# Patient Record
Sex: Male | Born: 1975 | Hispanic: No | Marital: Married | Smoking: Current every day smoker
Health system: Southern US, Community
[De-identification: ages and names within clinical notes are randomized; demographics above are authoritative.]

## PROBLEM LIST (undated history)

## (undated) DIAGNOSIS — R7612 Nonspecific reaction to cell mediated immunity measurement of gamma interferon antigen response without active tuberculosis: Secondary | ICD-10-CM

## (undated) DIAGNOSIS — N5089 Other specified disorders of the male genital organs: Secondary | ICD-10-CM

## (undated) DIAGNOSIS — A048 Other specified bacterial intestinal infections: Secondary | ICD-10-CM

## (undated) DIAGNOSIS — N200 Calculus of kidney: Secondary | ICD-10-CM

## (undated) DIAGNOSIS — F4310 Post-traumatic stress disorder, unspecified: Secondary | ICD-10-CM

## (undated) DIAGNOSIS — E119 Type 2 diabetes mellitus without complications: Secondary | ICD-10-CM

## (undated) DIAGNOSIS — K219 Gastro-esophageal reflux disease without esophagitis: Secondary | ICD-10-CM

## (undated) DIAGNOSIS — I219 Acute myocardial infarction, unspecified: Secondary | ICD-10-CM

## (undated) DIAGNOSIS — E7800 Pure hypercholesterolemia, unspecified: Secondary | ICD-10-CM

## (undated) DIAGNOSIS — Z9889 Other specified postprocedural states: Secondary | ICD-10-CM

## (undated) DIAGNOSIS — R0789 Other chest pain: Secondary | ICD-10-CM

## (undated) HISTORY — DX: Other chest pain: R07.89

## (undated) HISTORY — DX: Other specified bacterial intestinal infections: A04.8

## (undated) HISTORY — DX: Post-traumatic stress disorder, unspecified: F43.10

## (undated) HISTORY — PX: HERNIA REPAIR: SHX51

## (undated) HISTORY — PX: CARDIAC CATHETERIZATION: SHX172

## (undated) HISTORY — DX: Gastro-esophageal reflux disease without esophagitis: K21.9

## (undated) HISTORY — DX: Other specified postprocedural states: Z98.890

---
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
## 2015-02-14 ENCOUNTER — Emergency Department (HOSPITAL_COMMUNITY): Payer: Medicaid Other

## 2015-02-14 ENCOUNTER — Observation Stay (HOSPITAL_COMMUNITY)
Admission: EM | Admit: 2015-02-14 | Discharge: 2015-02-15 | Disposition: A | Discharge status: Home / Self Care | Payer: Medicaid Other | Attending: Internal Medicine | Admitting: Internal Medicine

## 2015-02-14 ENCOUNTER — Encounter (HOSPITAL_COMMUNITY): Payer: Self-pay | Admitting: Emergency Medicine

## 2015-02-14 DIAGNOSIS — R079 Chest pain, unspecified: Secondary | ICD-10-CM | POA: Diagnosis present

## 2015-02-14 DIAGNOSIS — N200 Calculus of kidney: Secondary | ICD-10-CM | POA: Diagnosis not present

## 2015-02-14 DIAGNOSIS — I25119 Atherosclerotic heart disease of native coronary artery with unspecified angina pectoris: Secondary | ICD-10-CM | POA: Diagnosis not present

## 2015-02-14 DIAGNOSIS — I209 Angina pectoris, unspecified: Secondary | ICD-10-CM

## 2015-02-14 DIAGNOSIS — Z720 Tobacco use: Secondary | ICD-10-CM | POA: Insufficient documentation

## 2015-02-14 DIAGNOSIS — E876 Hypokalemia: Secondary | ICD-10-CM | POA: Diagnosis present

## 2015-02-14 DIAGNOSIS — Z9889 Other specified postprocedural states: Secondary | ICD-10-CM | POA: Insufficient documentation

## 2015-02-14 DIAGNOSIS — R9431 Abnormal electrocardiogram [ECG] [EKG]: Secondary | ICD-10-CM

## 2015-02-14 DIAGNOSIS — Z7982 Long term (current) use of aspirin: Secondary | ICD-10-CM | POA: Diagnosis not present

## 2015-02-14 HISTORY — DX: Calculus of kidney: N20.0

## 2015-02-14 LAB — BASIC METABOLIC PANEL
ANION GAP: 7 (ref 5–15)
BUN: 17 mg/dL (ref 6–20)
CHLORIDE: 108 mmol/L (ref 101–111)
CO2: 24 mmol/L (ref 22–32)
Calcium: 9.5 mg/dL (ref 8.9–10.3)
Creatinine, Ser: 0.7 mg/dL (ref 0.61–1.24)
GFR calc Af Amer: >60 mL/min (ref >60)
GLUCOSE: 111 mg/dL — AB (ref 65–99)
POTASSIUM: 3.4 mmol/L — AB (ref 3.5–5.1)
Sodium: 139 mmol/L (ref 135–145)

## 2015-02-14 LAB — CBC
HEMATOCRIT: 45.2 % (ref 39.0–52.0)
HEMOGLOBIN: 15.9 g/dL (ref 13.0–17.0)
MCH: 30.1 pg (ref 26.0–34.0)
MCHC: 35.2 g/dL (ref 30.0–36.0)
MCV: 85.6 fL (ref 78.0–100.0)
Platelets: 220 10*3/uL (ref 150–400)
RBC: 5.28 MIL/uL (ref 4.22–5.81)
RDW: 12.4 % (ref 11.5–15.5)
WBC: 7.2 10*3/uL (ref 4.0–10.5)

## 2015-02-14 LAB — I-STAT TROPONIN, ED: Troponin i, poc: 0 ng/mL (ref 0.00–0.08)

## 2015-02-14 IMAGING — DX DG CHEST 2 VIEW
2 series · 2 of 2 positions shown · non-contrast
Comparison: None.

CLINICAL DATA: Acute onset of severe chest pain radiating into the
left arm associated with diaphoresis earlier this evening.

EXAM:
CHEST  2 VIEW

[chest lat]
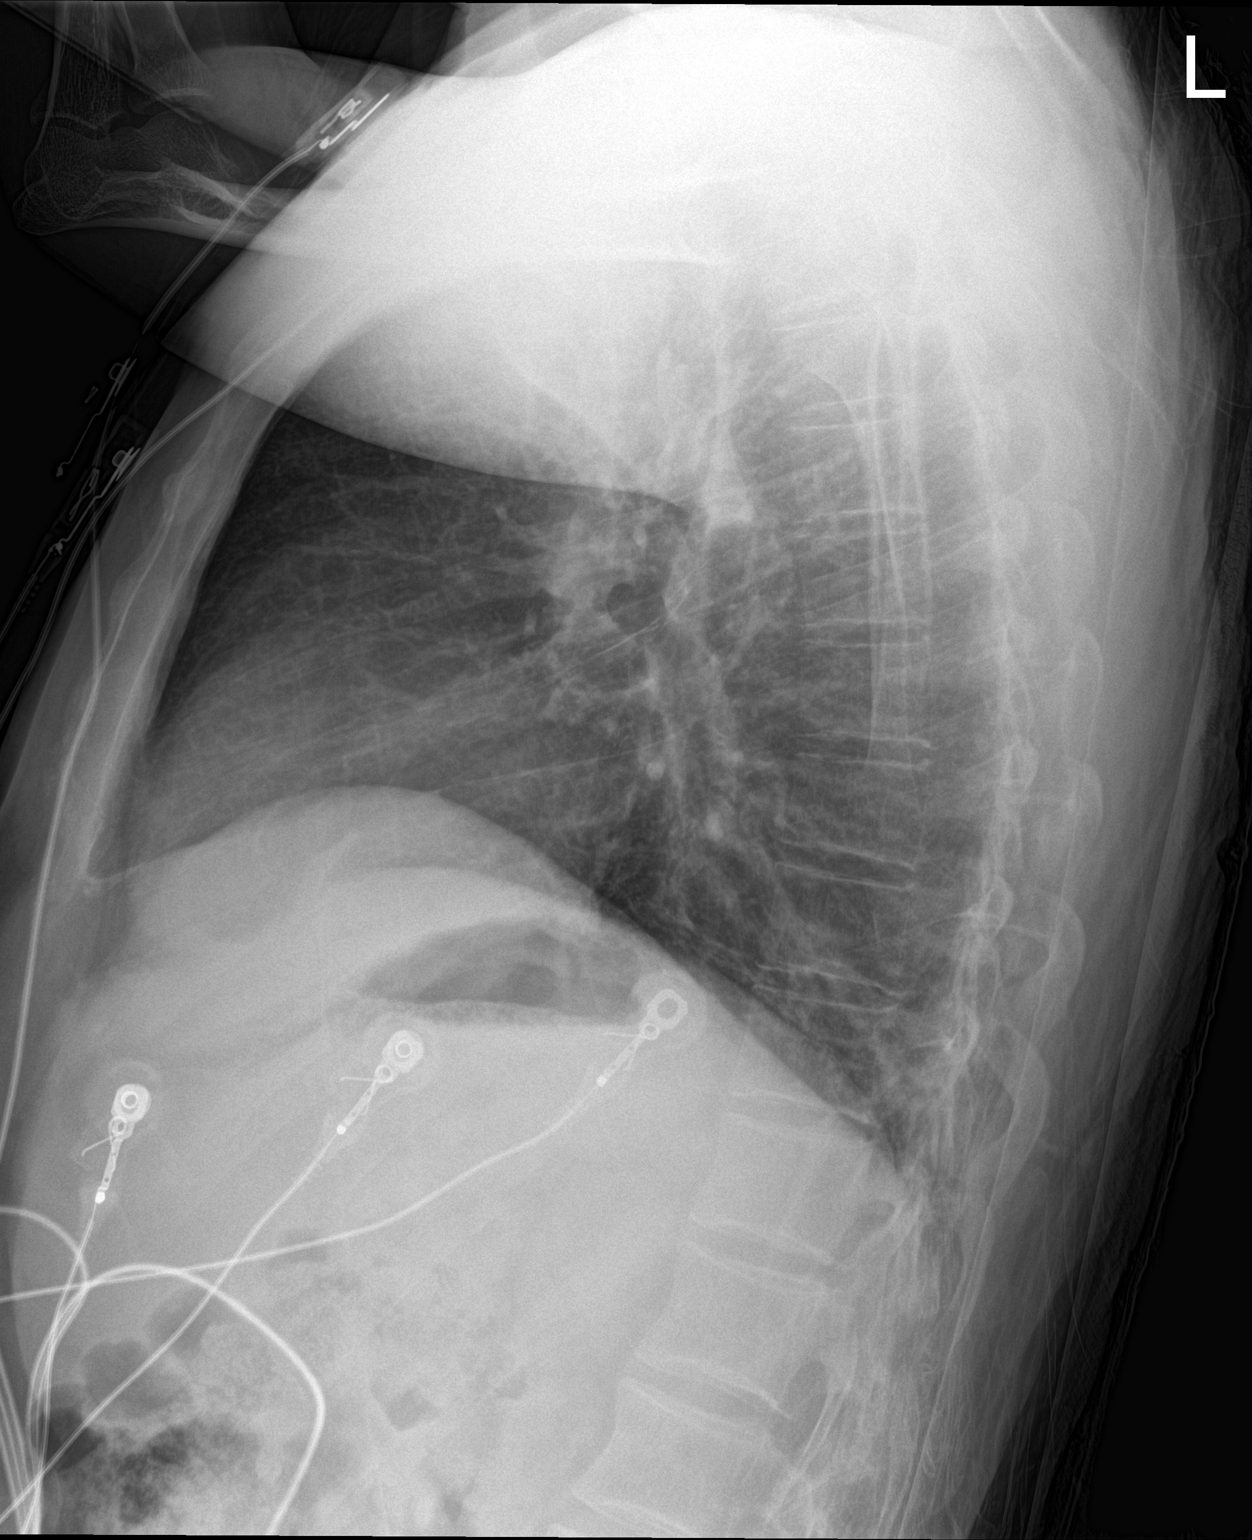

[chest ap]
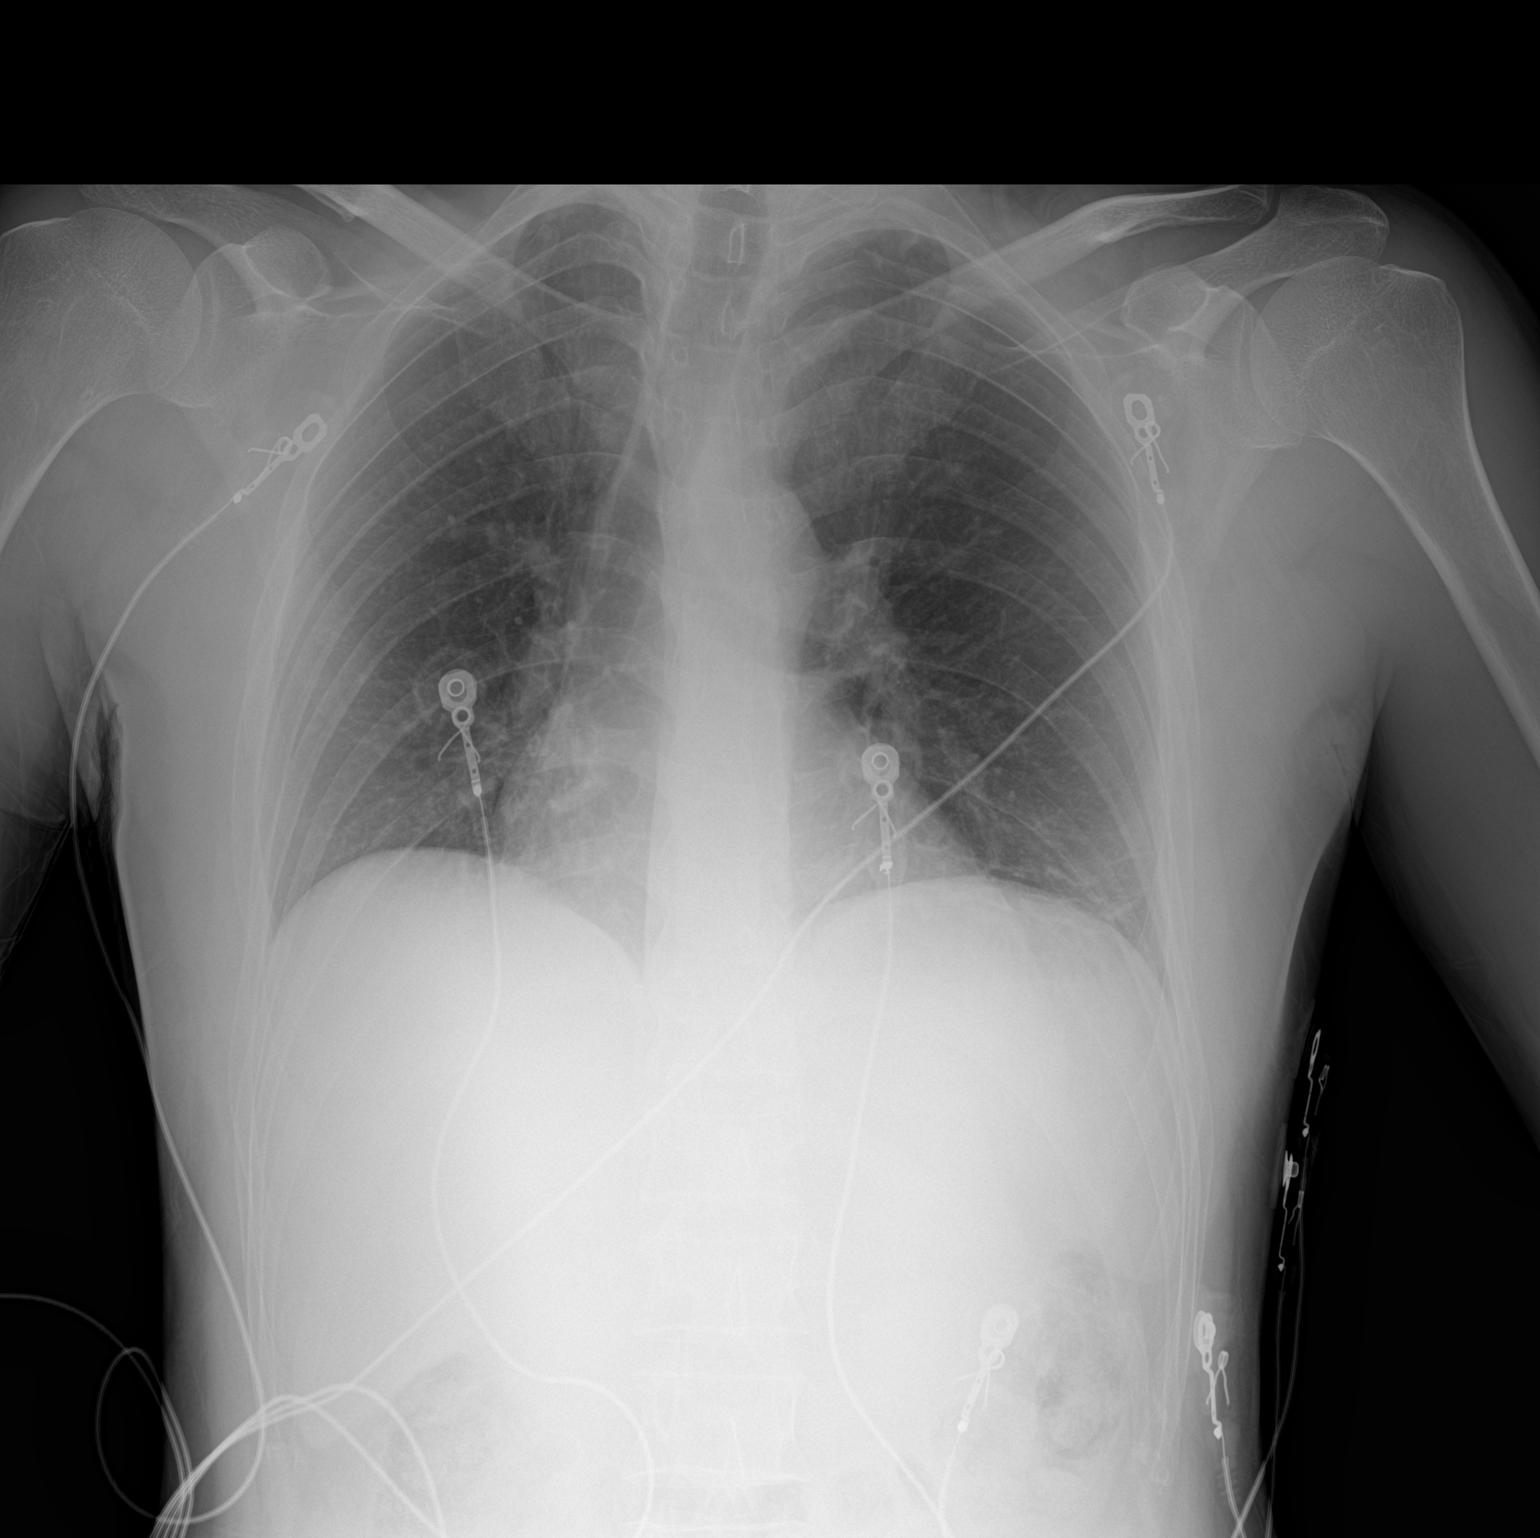

[2 of 2 positions shown; findings below may reference images not displayed]

FINDINGS: Suboptimal inspiration accounts for crowded bronchovascular
markings, especially in the bases, and accentuates the cardiac
silhouette. Taking this into account, cardiomediastinal silhouette
unremarkable. Linear atelectasis or scar in the left lower lobe.
Lungs otherwise clear. No localized airspace consolidation. No
pleural effusions. No pneumothorax. Normal pulmonary vascularity.
Visualized bony thorax intact.
IMPRESSION: Suboptimal inspiration. Linear atelectasis or scar in the left lower
lobe. No acute cardiopulmonary disease otherwise.

## 2015-02-14 NOTE — ED Notes (Signed)
Pt brought to ED by [facility] no able to communicate on English pt is from [place], [place] call for 10/10 CP going to his left arm, pt very diaphoretic on EMS arrival, 324 mg ASA PO given and 3 nitro SL pain down 4/10 on face scale, VS BP 151/84, HR 80, 100% RA. Pt resting on bed no distress noticed.

## 2015-02-14 NOTE — ED Notes (Signed)
Dr. Nanavati at the bedside.  

## 2015-02-14 NOTE — ED Notes (Signed)
Pt may language is Arabic, language line used, interpreter mumber [phone].

## 2015-02-14 NOTE — ED Provider Notes (Signed)
CSN: [id number]     Arrival date & time 02/14/15  2045 History   First MD Initiated Contact with Patient 02/14/15 2122     Chief Complaint  Patient presents with  . Chest Pain     (Consider location/radiation/quality/duration/timing/severity/associated sxs/prior Treatment) HPI Comments: Pt comes in with cc of chest pain. Pt speaks Arabic and translation phone used. Pt reports that he started having chest pain when he got upset earlier. The pain is L sided chest pain with radiation to the jaw and down his arm. Pain resolved with nitro that was given by EMS. There are no associated symptoms with the pain. Pt has had cath in the past at [facility] in [place]. Pt has no hx of PE, and besides recent long distance travels over the plane to [place]. Pt reports hx of stroke in the past as well. He is a smoker, father had a MI in his 1s.   ROS 10 Systems reviewed and are negative for acute change except as noted in the HPI.     The history is provided by the patient.    Past Medical History  Diagnosis Date  . Coronary artery disease     Cath in [place] and [place] but no PCI done  . Kidney stone    Past Surgical History  Procedure Laterality Date  . Hernia repair    . Cardiac catheterization      x 2   Family History  Problem Relation Age of Onset  . CAD Father   . Cancer Mother    Social History  Substance Use Topics  . Smoking status: Current Every Day Smoker -- 0.25 packs/day    Types: Cigarettes  . Smokeless tobacco: Never Used  . Alcohol Use: No    Review of Systems  Cardiovascular: Positive for chest pain.  All other systems reviewed and are negative.     Allergies  Review of patient's allergies indicates no known allergies.  Home Medications   Prior to Admission medications   Medication Sig Start Date End Date Taking? Authorizing Provider  aspirin 325 MG tablet Take 325 mg by mouth daily.   Yes Historical Provider, MD   BP 120/63 mmHg  Pulse 79   Temp(Src) 98.4 F (36.9 C) (Oral)  Resp 18  Ht 5' 2.99" (1.6 m)  Wt 139 lb 1.6 oz (63.095 kg)  BMI 24.65 kg/m2  SpO2 97% Physical Exam  Constitutional: He is oriented to person, place, and time. He appears well-developed.  HENT:  Head: Normocephalic and atraumatic.  Eyes: Conjunctivae and EOM are normal. Pupils are equal, round, and reactive to light.  Neck: Normal range of motion. Neck supple.  Cardiovascular: Normal rate and regular rhythm.   Pulmonary/Chest: Effort normal and breath sounds normal.  Abdominal: Soft. Bowel sounds are normal. He exhibits no distension. There is no tenderness. There is no rebound and no guarding.  Neurological: He is alert and oriented to person, place, and time.  Skin: Skin is warm.  Nursing note and vitals reviewed.     ED Course  Procedures (including critical care time) Labs Review Labs Reviewed  BASIC METABOLIC PANEL - Abnormal; Notable for the following:    Potassium 3.4 (*)    Glucose, Bld 111 (*)    All other components within normal limits  HEMOGLOBIN A1C - Abnormal; Notable for the following:    Hgb A1c MFr Bld 5.8 (*)    All other components within normal limits  LIPID PANEL - Abnormal;  Notable for the following:    HDL 36 (*)    LDL Cholesterol 129 (*)    All other components within normal limits  CBC  TROPONIN I  TROPONIN I  TROPONIN I  TROPONIN I  I-STAT TROPOININ, ED  I-STAT TROPOININ, ED    Imaging Review Dg Chest 2 View  02/14/2015   CLINICAL DATA:  Acute onset of severe chest pain radiating into the left arm associated with diaphoresis earlier this evening.  EXAM: CHEST  2 VIEW  COMPARISON:  None.  FINDINGS: Suboptimal inspiration accounts for crowded bronchovascular markings, especially in the bases, and accentuates the cardiac silhouette. Taking this into account, cardiomediastinal silhouette unremarkable. Linear atelectasis or scar in the left lower lobe. Lungs otherwise clear. No localized airspace consolidation.  No pleural effusions. No pneumothorax. Normal pulmonary vascularity. Visualized bony thorax intact.  IMPRESSION: Suboptimal inspiration. Linear atelectasis or scar in the left lower lobe. No acute cardiopulmonary disease otherwise.   Electronically Signed   By: Hulan Saas M.D.   On: 02/14/2015 21:51   Nm Myocar Multi W/spect W/wall Motion / Ef  02/15/2015    There was no ST segment deviation noted during stress.  The study is normal.  This is a low risk study.  The left ventricular ejection fraction is mildly decreased (45-54%).   This a normal study. LVEF appears better than calculated.    I have personally reviewed and evaluated these images and lab results as part of my medical decision-making.   EKG Interpretation   Date/Time:  Monday February 14 2015 20:53:17 EDT Ventricular Rate:  87 PR Interval:  141 QRS Duration: 80 QT Interval:  348 QTC Calculation: 419 R Axis:   56 Text Interpretation:  Sinus rhythm Borderline repolarization abnormality  st depression lead 3, avf t wave inversion v3-v6 diffuse q waves No old  tracing to compare Confirmed by Christopher Glasscock, MD, Janey Genta [phone]) on 02/14/2015  9:05:14 PM        MDM   Final diagnoses:  Angina pectoris  Abnormal EKG    Pt comes in with cc of chest pain. HEAR score is 3 - 2 for hx and 1 for EKG. That being said, pt doesn't speak English, just arrived to [place] a week ago and has no pcp, no Cardiology and no understanding of [place] healthcare system - so we will admit the patient for further evaluation and serial trops.    Derwood Kaplan, MD 02/16/15 [phone]

## 2015-02-15 ENCOUNTER — Observation Stay (HOSPITAL_COMMUNITY): Payer: Medicaid Other

## 2015-02-15 ENCOUNTER — Encounter (HOSPITAL_COMMUNITY): Payer: Self-pay | Admitting: Internal Medicine

## 2015-02-15 DIAGNOSIS — R079 Chest pain, unspecified: Secondary | ICD-10-CM

## 2015-02-15 DIAGNOSIS — Z720 Tobacco use: Secondary | ICD-10-CM | POA: Diagnosis not present

## 2015-02-15 DIAGNOSIS — I2510 Atherosclerotic heart disease of native coronary artery without angina pectoris: Secondary | ICD-10-CM

## 2015-02-15 DIAGNOSIS — E876 Hypokalemia: Secondary | ICD-10-CM | POA: Diagnosis not present

## 2015-02-15 DIAGNOSIS — N200 Calculus of kidney: Secondary | ICD-10-CM | POA: Diagnosis not present

## 2015-02-15 DIAGNOSIS — I25119 Atherosclerotic heart disease of native coronary artery with unspecified angina pectoris: Secondary | ICD-10-CM | POA: Diagnosis not present

## 2015-02-15 DIAGNOSIS — Z7982 Long term (current) use of aspirin: Secondary | ICD-10-CM | POA: Diagnosis not present

## 2015-02-15 LAB — NM MYOCAR MULTI W/SPECT W/WALL MOTION / EF
CHL CUP NUCLEAR SRS: 5
CHL CUP NUCLEAR SSS: 6
LHR: 0.24
LV dias vol: 81 mL
LVSYSVOL: 44 mL
NUC STRESS TID: 1.09
Rest HR: 64 {beats}/min
SDS: 1

## 2015-02-15 LAB — LIPID PANEL
CHOLESTEROL: 188 mg/dL (ref 0–200)
HDL: 36 mg/dL — AB (ref >40)
LDL CALC: 129 mg/dL — AB (ref 0–99)
TRIGLYCERIDES: 116 mg/dL (ref <150)
Total CHOL/HDL Ratio: 5.2 RATIO
VLDL: 23 mg/dL (ref 0–40)

## 2015-02-15 LAB — TROPONIN I
Troponin I: <0.03 ng/mL (ref <0.031)
Troponin I: <0.03 ng/mL (ref <0.031)

## 2015-02-15 LAB — I-STAT TROPONIN, ED: TROPONIN I, POC: 0.01 ng/mL (ref 0.00–0.08)

## 2015-02-15 MED ORDER — ENOXAPARIN SODIUM 40 MG/0.4ML ~~LOC~~ SOLN
40.0000 mg | SUBCUTANEOUS | Status: DC
  Administered 2015-02-15: 40 mg via SUBCUTANEOUS
  Filled 2015-02-15: qty 0.4

## 2015-02-15 MED ORDER — REGADENOSON 0.4 MG/5ML IV SOLN
0.4000 mg | Freq: Once | INTRAVENOUS | Status: DC
  Filled 2015-02-15: qty 5

## 2015-02-15 MED ORDER — ACETAMINOPHEN 325 MG PO TABS: 650.0000 mg | ORAL_TABLET | ORAL | Status: DC | PRN

## 2015-02-15 MED ORDER — MORPHINE SULFATE (PF) 2 MG/ML IV SOLN
2.0000 mg | INTRAVENOUS | Status: DC | PRN
  Administered 2015-02-15: 2 mg via INTRAVENOUS
  Filled 2015-02-15: qty 1

## 2015-02-15 MED ORDER — ONDANSETRON HCL 4 MG/2ML IJ SOLN
4.0000 mg | Freq: Four times a day (QID) | INTRAMUSCULAR | Status: DC | PRN
  Administered 2015-02-15: 4 mg via INTRAVENOUS
  Filled 2015-02-15: qty 2

## 2015-02-15 MED ORDER — TECHNETIUM TC 99M SESTAMIBI GENERIC - CARDIOLITE
33.0000 | Freq: Once | INTRAVENOUS | Status: AC | PRN
  Administered 2015-02-15: 33 via INTRAVENOUS

## 2015-02-15 MED ORDER — REGADENOSON 0.4 MG/5ML IV SOLN
INTRAVENOUS | Status: AC
  Filled 2015-02-15: qty 5

## 2015-02-15 MED ORDER — ASPIRIN EC 325 MG PO TBEC
325.0000 mg | DELAYED_RELEASE_TABLET | Freq: Every day | ORAL | Status: DC
  Administered 2015-02-15: 325 mg via ORAL
  Filled 2015-02-15: qty 1

## 2015-02-15 MED ORDER — TECHNETIUM TC 99M SESTAMIBI GENERIC - CARDIOLITE
10.9000 | Freq: Once | INTRAVENOUS | Status: AC | PRN
  Administered 2015-02-15: 10.9 via INTRAVENOUS

## 2015-02-15 NOTE — Progress Notes (Signed)
Lexiscan Myoview performed, images and final report pending.  Corine Shelter PA-C 02/15/2015 12:34 PM

## 2015-02-15 NOTE — Progress Notes (Signed)
Patient discharged.  Discharge instructions and followup information reviewed with patient and interpreter.  Virgel Bouquet took patient home.

## 2015-02-15 NOTE — Progress Notes (Signed)
Myoview read by Dr Delton See- normal. We can arrange f/u as an OP if needed.  Corine Shelter PA-C 02/15/2015 3:31 PM

## 2015-02-15 NOTE — Progress Notes (Signed)
Dr. Gwendolyn Grant accepted the patient, he will now be the patients PCP.  He also will be taking over the care of the other family members.  This information is per Maren Reamer, RN with [facility] congregational nurses.

## 2015-02-15 NOTE — Discharge Summary (Signed)
Physician Discharge Summary  Aaron Reyes ZOX:[id number] DOB: [birth year] DOA: 02/14/2015  PCP: Dr Renold Don Admit date: 02/14/2015 Discharge date: 02/15/2015  Time spent: 20 minutes  Recommendations for Outpatient Follow-up:  1. D/c home with outopt follow up with PCP and cardiology  Discharge Diagnoses:  Active Problems:   Chest pain at rest   Hypokalemia   Coronary artery disease   Discharge Condition: fair  Diet recommendation: heart heallty  Filed Weights   02/14/15 2100 02/14/15 2117 02/15/15 0201  Weight: 86.183 kg (190 lb) 65 kg (143 lb 4.8 oz) 63.095 kg (139 lb 1.6 oz)    History of present illness:  Aaron Reyes is a 39 y.o. male   has a past medical history of Coronary artery disease and Kidney stone.   He was brought to ED with chest pain onset of the patient being emotionally upset. Pain was radiating to left arm area. EMS notes patient was diaphoretic on their arrival. He was given aspirin as well as nitroglycerin. After which hs pain improved.  Patient and his wife and 5 small children recently arrived from [place]. Reports having hx of CAD needing cardiac catheterization x2 ( 6years and 1 year back, reports he was told having some abnormal artery.) States he has lost all his medical records. He takes aspirin at home.  On arrival to emergency department troponin was given normal limits but EKG showed nonspecific changes no old EKG available. Patient  admitted for further workup.   Hospital Course:  Chest pain  appears atypical and musculoskeletal vs related to anxiety  serial troponin negative Stress myoview negative for ischemia shows EFof 45-54% . 2D echo with normal EF and no wall motion abnormality. Patient stable for discharge home. Continue ASA. Has been established care with new PCP. Cardiology to follow as outpt as needed.  Hypokalemia replenished  Procedures:  2 d ECHO  myoview  Consultations:  cardiology  Discharge Exam: Filed  Vitals:   02/15/15 1233  BP:   Pulse: 79  Temp:   Resp:     General: NAD HEENT: no pallor, moist mucosa Chest : clear b/l  Cardiovascular: NS1& S2, no murmurs GI: soft, NT, ND, BS+ Musculoskeletal: warm, no edema  Discharge Instructions    Current Discharge Medication List    CONTINUE these medications which have NOT CHANGED   Details  aspirin 325 MG tablet Take 325 mg by mouth daily.      STOP taking these medications     PRESCRIPTION MEDICATION        No Known Allergies Follow-up Information    Follow up with [facility], MD On 03/16/2015.   Specialty:  Family Medicine   Why:  3 pm   Contact information:   [address] [place] [address] [phone]        The results of significant diagnostics from this hospitalization (including imaging, microbiology, ancillary and laboratory) are listed below for reference.    Significant Diagnostic Studies: Dg Chest 2 View  02/14/2015   CLINICAL DATA:  Acute onset of severe chest pain radiating into the left arm associated with diaphoresis earlier this evening.  EXAM: CHEST  2 VIEW  COMPARISON:  None.  FINDINGS: Suboptimal inspiration accounts for crowded bronchovascular markings, especially in the bases, and accentuates the cardiac silhouette. Taking this into account, cardiomediastinal silhouette unremarkable. Linear atelectasis or scar in the left lower lobe. Lungs otherwise clear. No localized airspace consolidation. No pleural effusions. No pneumothorax. Normal pulmonary vascularity. Visualized bony thorax intact.  IMPRESSION: Suboptimal inspiration. Linear atelectasis or scar in the left lower lobe. No acute cardiopulmonary disease otherwise.   Electronically Signed   By: Hulan Saas M.D.   On: 02/14/2015 21:51   Nm Myocar Multi W/spect W/wall Motion / Ef  02/15/2015    There was no ST segment deviation noted during stress.  The study is normal.  This is a low risk study.  The left ventricular  ejection fraction is mildly decreased (45-54%).   This a normal study. LVEF appears better than calculated.     Microbiology: No results found for this or any previous visit (from the past 240 hour(s)).   Labs: Basic Metabolic Panel:  Recent Labs Lab 02/14/15 2125  NA 139  K 3.4*  CL 108  CO2 24  GLUCOSE 111*  BUN 17  CREATININE 0.70  CALCIUM 9.5   Liver Function Tests: No results for input(s): AST, ALT, ALKPHOS, BILITOT, PROT, ALBUMIN in the last 168 hours. No results for input(s): LIPASE, AMYLASE in the last 168 hours. No results for input(s): AMMONIA in the last 168 hours. CBC:  Recent Labs Lab 02/14/15 2125  WBC 7.2  HGB 15.9  HCT 45.2  MCV 85.6  PLT 220   Cardiac Enzymes:  Recent Labs Lab 02/14/15 2359 02/15/15 0510 02/15/15 0734 02/15/15 1045  TROPONINI <0.03 <0.03 <0.03 <0.03   BNP: BNP (last 3 results) No results for input(s): BNP in the last 8760 hours.  ProBNP (last 3 results) No results for input(s): PROBNP in the last 8760 hours.  CBG: No results for input(s): GLUCAP in the last 168 hours.     SignedEddie North  [facility] 02/15/2015, 4:24 PM

## 2015-02-15 NOTE — Consult Note (Signed)
Admit date: 02/14/2015 Referring Physician  Dr. Gonzella Lex Primary Physician  None Primary Cardiologist  None Reason for Consultation  Chest pain  HPI: Aaron Reyes is a 39 y.o. Male with a past medical history of Coronary artery disease and Kidney stone. He presented with chest pain which occurred after being emotionally upset. Pain was radiating to left arm area. EMS was called and noted that the patient was diaphoretic. He was given aspirin as well as nitroglycerin after which pain improved.  Patient and his wife and 5 small children recently arrived from [place]. He reports having a hx of CAD needing cardiac catheterization 4.5 years ago in [place] but nothing was done and then repeat cath in [place] and again nothing was done. He sates he has lost all his medical records. He takes aspirin on occasion.  On arrival to emergency department troponin was within normal limits but EKG showed nonspecific changes no old EKG available. He has ruled out for MI by enzymes.     The patient does not speak English so history obtained through an interpreter.  He says that he got very upset yesterday.  He moved his family here as refugees and the living conditions they are in are terrible.  There are roaches all over the home.  He called the agency that brought them here and was told that nothing was going to be done.  He got very upset and then started having sharp chest pain with SOB, dizziness and nausea and then had a syncopal episode.  He denies any further CP at this time.    PMH:   Past Medical History  Diagnosis Date  . Coronary artery disease   . Kidney stone      PSH:   Past Surgical History  Procedure Laterality Date  . Cardiac catheterization      x 2  . Hernia repair      Allergies:  Review of patient's allergies indicates no known allergies. Prior to Admit Meds:   Prescriptions prior to admission  Medication Sig Dispense Refill Last Dose  . aspirin 325 MG tablet Take 325 mg by  mouth daily.   02/15/2015 at Unknown time  . PRESCRIPTION MEDICATION Stomach medication   02/14/2015 at Unknown time   Fam HX:    Family History  Problem Relation Age of Onset  . CAD Father    Social HX:    Social History   Social History  . Marital Status: Married    Spouse Name: N/A  . Number of Children: N/A  . Years of Education: N/A   Occupational History  . Not on file.   Social History Main Topics  . Smoking status: Current Every Day Smoker -- 0.25 packs/day    Types: Cigarettes  . Smokeless tobacco: Never Used  . Alcohol Use: No  . Drug Use: No  . Sexual Activity: Not on file   Other Topics Concern  . Not on file   Social History Narrative     ROS:  All 11 ROS were addressed and are negative except what is stated in the HPI  Physical Exam: Blood pressure 114/72, pulse 60, temperature 98.4 F (36.9 C), temperature source Oral, resp. rate 18, height 5' 2.99" (1.6 m), weight 139 lb 1.6 oz (63.095 kg), SpO2 97 %.    General: Well developed, well nourished, in no acute distress Head: Eyes PERRLA, No xanthomas.   Normal cephalic and atramatic  Lungs:   Clear bilaterally to auscultation and percussion.  Heart:   HRRR S1 S2 Pulses are 2+ & equal.            No carotid bruit. No JVD.  No abdominal bruits. No femoral bruits. Abdomen: Bowel sounds are positive, abdomen soft and non-tender without masses Extremities:   No clubbing, cyanosis or edema.  DP +1 Neuro: Alert and oriented X 3. Psych:  Good affect, responds appropriately    Labs:   Lab Results  Component Value Date   WBC 7.2 02/14/2015   HGB 15.9 02/14/2015   HCT 45.2 02/14/2015   MCV 85.6 02/14/2015   PLT 220 02/14/2015     Recent Labs Lab 02/14/15 2125  NA 139  K 3.4*  CL 108  CO2 24  BUN 17  CREATININE 0.70  CALCIUM 9.5  GLUCOSE 111*   No results found for: PTT No results found for: INR, PROTIME Lab Results  Component Value Date   TROPONINI <0.03 02/15/2015     Lab Results    Component Value Date   CHOL 188 02/15/2015   Lab Results  Component Value Date   HDL 36* 02/15/2015   Lab Results  Component Value Date   LDLCALC 129* 02/15/2015   Lab Results  Component Value Date   TRIG 116 02/15/2015   Lab Results  Component Value Date   CHOLHDL 5.2 02/15/2015   No results found for: LDLDIRECT    Radiology:  Dg Chest 2 View  02/14/2015   CLINICAL DATA:  Acute onset of severe chest pain radiating into the left arm associated with diaphoresis earlier this evening.  EXAM: CHEST  2 VIEW  COMPARISON:  None.  FINDINGS: Suboptimal inspiration accounts for crowded bronchovascular markings, especially in the bases, and accentuates the cardiac silhouette. Taking this into account, cardiomediastinal silhouette unremarkable. Linear atelectasis or scar in the left lower lobe. Lungs otherwise clear. No localized airspace consolidation. No pleural effusions. No pneumothorax. Normal pulmonary vascularity. Visualized bony thorax intact.  IMPRESSION: Suboptimal inspiration. Linear atelectasis or scar in the left lower lobe. No acute cardiopulmonary disease otherwise.   Electronically Signed   By: Hulan Saas M.D.   On: 02/14/2015 21:51    EKG:  #1 NSR with diffuse inferolateral T wave inversions.  #2 NSR with LVH and nonspecific T wave changes  ASSESSMENT/PLAN: 1.  Chest pain with dynamic T wave changes on EKG.  His cardiac enzymes are negative.  He does have evidence of LVH on EKG so T wave changes could be due to repolarization changes.  Will check a 2D echo to assess LVF and get a stress myoview to rule out ischemia. 2.  ? History of remote cath 4 years ago in [place] but does not know the results 3.  ? CAD with cath x2 but no PCI.  Continue ASA.  Check FLP and HbA1C   Quintella Reichert, MD  02/15/2015  10:48 AM

## 2015-02-15 NOTE — H&P (Addendum)
PCP:  No PCP Per Patient    Referring provider [place]   Chief Complaint: Chest pain  HPI: Aaron Reyes is a 39 y.o. male   has a past medical history of Coronary artery disease and Kidney stone.   Presented with chest pain onset of the patient being emotionally upset. Pain was radiating to left arm area. EMS notes patient was diaphoretic on their arrival. He was given aspirin as well as nitroglycerin. After which is pain has improved.  Patient and his wife and 5 small children recently arrived from [place].  Reports having hx of CAD needing cardiac catheterization 4.5 years ago in [place]. States he has lost all his medical records. He takes aspirin on occasion.  On arrival to emergency department troponin was given normal limits but EKG showed nonspecific changes no old EKG available. Patient is being admitted for further workup.   Hospitalist was called for admission for Chest pain  Review of Systems:    Pertinent positives include:  chest pain  Constitutional:  No weight loss, night sweats, Fevers, chills, fatigue, weight loss  HEENT:  No headaches, Difficulty swallowing,Tooth/dental problems,Sore throat,  No sneezing, itching, ear ache, nasal congestion, post nasal drip,  Cardio-vascular:  No , Orthopnea, PND, anasarca, dizziness, palpitations.no Bilateral lower extremity swelling  GI:  No heartburn, indigestion, abdominal pain, nausea, vomiting, diarrhea, change in bowel habits, loss of appetite, melena, blood in stool, hematemesis Resp:  no shortness of breath at rest. No dyspnea on exertion, No excess mucus, no productive cough, No non-productive cough, No coughing up of blood.No change in color of mucus.No wheezing. Skin:  no rash or lesions. No jaundice GU:  no dysuria, change in color of urine, no urgency or frequency. No straining to urinate.  No flank pain.  Musculoskeletal:  No joint pain or no joint swelling. No decreased range of motion. No back pain.    Psych:  No change in mood or affect. No depression or anxiety. No memory loss.  Neuro: no localizing neurological complaints, no tingling, no weakness, no double vision, no gait abnormality, no slurred speech, no confusion  Otherwise ROS are negative except for above, 10 systems were reviewed  Past Medical History: Past Medical History  Diagnosis Date  . Coronary artery disease   . Kidney stone    Past Surgical History  Procedure Laterality Date  . Cardiac catheterization      x 2     Medications: Prior to Admission medications   Not on File    Allergies:  No Known Allergies  Social History:  Ambulatory  Independently  Lives at home With family     reports that he has been smoking.  He does not have any smokeless tobacco history on file. He reports that he does not drink alcohol or use illicit drugs.    Family History: family history includes CAD in his father.    Physical Exam: Patient Vitals for the past 24 hrs:  BP Temp Temp src Pulse Resp SpO2 Height Weight  02/15/15 0115 109/75 mmHg - - 62 15 98 % - -  02/15/15 0100 104/74 mmHg - - (!) 57 19 98 % - -  02/15/15 0045 113/72 mmHg - - 65 21 98 % - -  02/15/15 0030 126/80 mmHg - - 64 18 99 % - -  02/15/15 0015 113/59 mmHg - - 69 21 98 % - -  02/15/15 0000 109/83 mmHg - - 66 15 98 % - -  02/14/15 2345  98/75 mmHg - - 67 18 99 % - -  02/14/15 2330 121/69 mmHg - - 71 20 98 % - -  02/14/15 2315 111/67 mmHg - - 63 11 97 % - -  02/14/15 2300 111/81 mmHg - - 71 19 98 % - -  02/14/15 2245 110/75 mmHg - - 65 14 98 % - -  02/14/15 2230 113/69 mmHg - - 62 18 98 % - -  02/14/15 2215 110/76 mmHg - - 68 21 97 % - -  02/14/15 2200 117/93 mmHg - - 71 16 99 % - -  02/14/15 2130 117/74 mmHg - - 76 21 98 % - -  02/14/15 2117 - - - - - - 5' 2.99" (1.6 m) 65 kg (143 lb 4.8 oz)  02/14/15 2115 131/83 mmHg - - 72 20 99 % - -  02/14/15 2100 125/79 mmHg 98.3 F (36.8 C) Oral 79 21 98 % 5\' 9"  (1.753 m) 86.183 kg (190 lb)  02/14/15  2056 - - - - - 100 % - -    1. General:  in No Acute distress 2. Psychological: Alert and   Oriented 3. Head/ENT:   Mois  Mucous Membranes                          Head Non traumatic, neck supple                          Normal  Dentition 4. SKIN:   decreased Skin turgor,  Skin clean Dry and intact no rash multiple scars over chest and arms present multiple tattoos 5. Heart: Regular rate and rhythm no Murmur, Rub or gallop 6. Lungs: Clear to auscultation bilaterally, no wheezes or crackles   7. Abdomen: Soft, non-tender, Non distended 8. Lower extremities: no clubbing, cyanosis, or edema 9. Neurologically Grossly intact, moving all 4 extremities equally 10. MSK: Normal range of motion  body mass index is 25.39 kg/(m^2).   Labs on Admission:   Results for orders placed or performed during the hospital encounter of 02/14/15 (from the past 24 hour(s))  Basic metabolic panel     Status: Abnormal   Collection Time: 02/14/15  9:25 PM  Result Value Ref Range   Sodium 139 135 - 145 mmol/L   Potassium 3.4 (L) 3.5 - 5.1 mmol/L   Chloride 108 101 - 111 mmol/L   CO2 24 22 - 32 mmol/L   Glucose, Bld 111 (H) 65 - 99 mg/dL   BUN 17 6 - 20 mg/dL   Creatinine, Ser 9.60 0.61 - 1.24 mg/dL   Calcium 9.5 8.9 - 45.4 mg/dL   GFR calc non Af Amer >60 >60 mL/min   GFR calc Af Amer >60 >60 mL/min   Anion gap 7 5 - 15  CBC     Status: None   Collection Time: 02/14/15  9:25 PM  Result Value Ref Range   WBC 7.2 4.0 - 10.5 K/uL   RBC 5.28 4.22 - 5.81 MIL/uL   Hemoglobin 15.9 13.0 - 17.0 g/dL   HCT 09.8 11.9 - 14.7 %   MCV 85.6 78.0 - 100.0 fL   MCH 30.1 26.0 - 34.0 pg   MCHC 35.2 30.0 - 36.0 g/dL   RDW 82.9 56.2 - 13.0 %   Platelets 220 150 - 400 K/uL  I-stat troponin, ED     Status: None   Collection Time: 02/14/15  9:33 PM  Result Value Ref Range   Troponin i, poc 0.00 0.00 - 0.08 ng/mL   Comment 3          I-stat troponin, ED     Status: None   Collection Time: 02/14/15 11:54 PM  Result  Value Ref Range   Troponin i, poc 0.01 0.00 - 0.08 ng/mL   Comment 3          Troponin I     Status: None   Collection Time: 02/14/15 11:59 PM  Result Value Ref Range   Troponin I <0.03 <0.031 ng/mL     No results found for: HGBA1C  Estimated Creatinine Clearance: 101.7 mL/min (by C-G formula based on Cr of 0.7).  BNP (last 3 results) No results for input(s): PROBNP in the last 8760 hours.  Other results:  I have pearsonaly reviewed this: ECG REPORT  Rate: 87   Rhythm: Sinus ST&T Change: T wave flattening in lateral leads multiple Q waves noted   Filed Weights   02/14/15 2100 02/14/15 2117  Weight: 86.183 kg (190 lb) 65 kg (143 lb 4.8 oz)     Cultures: No results found for: SDES, SPECREQUEST, CULT, REPTSTATUS   Radiological Exams on Admission: Dg Chest 2 View  02/14/2015   CLINICAL DATA:  Acute onset of severe chest pain radiating into the left arm associated with diaphoresis earlier this evening.  EXAM: CHEST  2 VIEW  COMPARISON:  None.  FINDINGS: Suboptimal inspiration accounts for crowded bronchovascular markings, especially in the bases, and accentuates the cardiac silhouette. Taking this into account, cardiomediastinal silhouette unremarkable. Linear atelectasis or scar in the left lower lobe. Lungs otherwise clear. No localized airspace consolidation. No pleural effusions. No pneumothorax. Normal pulmonary vascularity. Visualized bony thorax intact.  IMPRESSION: Suboptimal inspiration. Linear atelectasis or scar in the left lower lobe. No acute cardiopulmonary disease otherwise.   Electronically Signed   By: Hulan Saas M.D.   On: 02/14/2015 21:51    Chart has been reviewed  Family   at  Bedside  plan of care was discussed with Wife   Assessment/Plan  68 yo M  from [place] reports with self-reported hx of CAD presents with chest pain in the setting of being emotionally upset. Currently chest pain free   Present on Admission:  . Chest pain - patient has  self-reported history of coronary disease no admit cycle cardiac enzymes obtain echogram. Patient will benefit from stress test and cardiology follow-up. We will admit to telemetry cycle cardiac enzymes obtain lipid panel and hemoglobin A1c  . CAD (coronary artery disease) - it is impossible to obtain old records. We will need cardiac workup to determine extent of coronary artery disease.  Will obtain Cardiac consult in AM . Hypokalemia will replace   Prophylaxis: Lovenox   CODE STATUS:  FULL CODE   DispositionTo home once workup is complete and patient is stable  Other plan as per orders.  I have spent a total of 65 min on this admission extra time taken due to social situation, patient with 5 small children  Shaughnessy Gethers 02/15/2015, 1:30 AM  [facility]  Pager [phone]   after 2 AM please page floor coverage PA If 7AM-7PM, please contact the day team taking care of the patient  [email or web address]  Password [email or web address]

## 2015-02-15 NOTE — Discharge Instructions (Signed)
Angina Pectoris  Angina pectoris, often just called angina, is extreme discomfort in your chest, neck, or arm caused by a lack of blood in the middle and thickest layer of your heart wall (myocardium). It may feel like tightness or heavy pressure. It may feel like a crushing or squeezing pain. Some people say it feels like gas or indigestion. It may go down your shoulders, back, and arms. Some people may have symptoms other than pain. These symptoms include fatigue, shortness of breath, cold sweats, or nausea. There are four different types of angina:  · Stable angina--Stable angina usually occurs in episodes of predictable frequency and duration. It usually is brought on by physical activity, emotional stress, or excitement. These are all times when the myocardium needs more oxygen. Stable angina usually lasts a few minutes and often is relieved by taking a medicine that can be taken under your tongue (sublingually). The medicine is called nitroglycerin. Stable angina is caused by a buildup of plaque inside the arteries, which restricts blood flow to the heart muscle (atherosclerosis).  · Unstable angina--Unstable angina can occur even when your body experiences little or no physical exertion. It can occur during sleep. It can also occur at rest. It can suddenly increase in severity or frequency. It might not be relieved by sublingual nitroglycerin. It can last up to 30 minutes. The most common cause of unstable angina is a blood clot that has developed on the top of plaque buildup inside a coronary artery. It can lead to a heart attack if the blood clot completely blocks the artery.  · Microvascular angina--This type of angina is caused by a disorder of tiny blood vessels called arterioles. Microvascular angina is more common in women. The pain may be more severe and last longer than other types of angina pectoris.  · Prinzmetal or variant angina--This type of angina pectoris usually occurs when your body  experiences little or no physical exertion. It especially occurs in the early morning hours. It is caused by a spasm of your coronary artery.  HOME CARE INSTRUCTIONS   · Only take over-the-counter and prescription medicines as directed by your health care provider.  · Stay active or increase your exercise as directed by your health care provider.  · Limit strenuous activity as directed by your health care provider.  · Limit heavy lifting as directed by your health care provider.  · Maintain a healthy weight.  · Learn about and eat heart-healthy foods.  · Do not use any tobacco products including cigarettes, chewing tobacco or electronic cigarettes.  SEEK IMMEDIATE MEDICAL CARE IF:   You experience the following symptoms:  · Chest, neck, deep shoulder, or arm pain or discomfort that lasts more than a few minutes.  · Chest, neck, deep shoulder, or arm pain or discomfort that goes away and comes back, repeatedly.  · Heavy sweating with discomfort, without a noticeable cause.  · Shortness of breath or difficulty breathing.  · Angina that does not get better after a few minutes of rest or after taking sublingual nitroglycerin.  These can all be symptoms of a heart attack, which is a medical emergency! Get medical help at once. Call your local emergency service (911 in [place] immediately. Do not  drive yourself to the hospital and do not  wait to for your symptoms to go away.  MAKE SURE YOU:  · Understand these instructions.  · Will watch your condition.  · Will get help right away if you are not   doing well or get worse.  Document Released: 06/04/2005 Document Revised: 06/09/2013 Document Reviewed: 10/06/2013  ExitCare® Patient Information ©2015 [employer]. This information is not intended to replace advice given to you by your health care provider. Make sure you discuss any questions you have with your health care provider.

## 2015-02-16 LAB — HEMOGLOBIN A1C
HEMOGLOBIN A1C: 5.8 % — AB (ref 4.8–5.6)
Mean Plasma Glucose: 120 mg/dL

## 2015-03-16 ENCOUNTER — Ambulatory Visit (INDEPENDENT_AMBULATORY_CARE_PROVIDER_SITE_OTHER): Payer: Medicaid Other | Admitting: Family Medicine

## 2015-03-16 VITALS — BP 112/76 | HR 91 | Temp 98.0°F | Ht 65.5 in | Wt 139.6 lb

## 2015-03-16 DIAGNOSIS — R079 Chest pain, unspecified: Secondary | ICD-10-CM | POA: Diagnosis not present

## 2015-03-16 MED ORDER — NITROGLYCERIN 0.4 MG SL SUBL: 0.4000 mg | SUBLINGUAL_TABLET | SUBLINGUAL | Status: DC | PRN
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
## 2015-03-18 ENCOUNTER — Encounter: Payer: Self-pay | Admitting: Family Medicine

## 2015-03-18 ENCOUNTER — Ambulatory Visit (INDEPENDENT_AMBULATORY_CARE_PROVIDER_SITE_OTHER): Payer: Medicaid Other | Admitting: Family Medicine

## 2015-03-18 ENCOUNTER — Other Ambulatory Visit (HOSPITAL_COMMUNITY)
Admission: RE | Admit: 2015-03-18 | Discharge: 2015-03-18 | Disposition: A | Discharge status: Home / Self Care | Payer: Medicaid Other | Source: Ambulatory Visit | Attending: Family Medicine | Admitting: Family Medicine

## 2015-03-18 VITALS — BP 133/83 | HR 69 | Temp 98.1°F | Ht 65.5 in | Wt 141.9 lb

## 2015-03-18 DIAGNOSIS — R079 Chest pain, unspecified: Secondary | ICD-10-CM

## 2015-03-18 DIAGNOSIS — K4091 Unilateral inguinal hernia, without obstruction or gangrene, recurrent: Secondary | ICD-10-CM | POA: Diagnosis not present

## 2015-03-18 DIAGNOSIS — Z008 Encounter for other general examination: Secondary | ICD-10-CM | POA: Diagnosis not present

## 2015-03-18 DIAGNOSIS — Z113 Encounter for screening for infections with a predominantly sexual mode of transmission: Secondary | ICD-10-CM | POA: Insufficient documentation

## 2015-03-18 DIAGNOSIS — Z599 Problem related to housing and economic circumstances, unspecified: Secondary | ICD-10-CM | POA: Diagnosis not present

## 2015-03-18 DIAGNOSIS — Z0289 Encounter for other administrative examinations: Secondary | ICD-10-CM | POA: Insufficient documentation

## 2015-03-18 DIAGNOSIS — N508 Other specified disorders of male genital organs: Secondary | ICD-10-CM

## 2015-03-18 DIAGNOSIS — K4090 Unilateral inguinal hernia, without obstruction or gangrene, not specified as recurrent: Secondary | ICD-10-CM | POA: Insufficient documentation

## 2015-03-18 DIAGNOSIS — N5089 Other specified disorders of the male genital organs: Secondary | ICD-10-CM

## 2015-03-18 HISTORY — DX: Other specified disorders of the male genital organs: N50.89

## 2015-03-18 LAB — CBC WITH DIFFERENTIAL/PLATELET
BASOS ABS: 0 10*3/uL (ref 0.0–0.1)
Basophils Relative: 0 % (ref 0–1)
EOS ABS: 0.2 10*3/uL (ref 0.0–0.7)
EOS PCT: 3 % (ref 0–5)
HCT: 45.8 % (ref 39.0–52.0)
Hemoglobin: 15.7 g/dL (ref 13.0–17.0)
LYMPHS ABS: 2.5 10*3/uL (ref 0.7–4.0)
Lymphocytes Relative: 39 % (ref 12–46)
MCH: 29.6 pg (ref 26.0–34.0)
MCHC: 34.3 g/dL (ref 30.0–36.0)
MCV: 86.3 fL (ref 78.0–100.0)
MONO ABS: 0.3 10*3/uL (ref 0.1–1.0)
MPV: 9.8 fL (ref 8.6–12.4)
Monocytes Relative: 5 % (ref 3–12)
Neutro Abs: 3.3 10*3/uL (ref 1.7–7.7)
Neutrophils Relative %: 53 % (ref 43–77)
PLATELETS: 235 10*3/uL (ref 150–400)
RBC: 5.31 MIL/uL (ref 4.22–5.81)
RDW: 13.1 % (ref 11.5–15.5)
WBC: 6.3 10*3/uL (ref 4.0–10.5)

## 2015-03-18 LAB — BASIC METABOLIC PANEL
BUN: 23 mg/dL (ref 7–25)
CO2: 22 mmol/L (ref 20–31)
CREATININE: 0.74 mg/dL (ref 0.60–1.35)
Calcium: 9.5 mg/dL (ref 8.6–10.3)
Chloride: 106 mmol/L (ref 98–110)
GLUCOSE: 90 mg/dL (ref 65–99)
Potassium: 4.3 mmol/L (ref 3.5–5.3)
Sodium: 137 mmol/L (ref 135–146)

## 2015-03-18 LAB — RPR

## 2015-03-18 MED ORDER — NITROGLYCERIN 0.4 MG SL SUBL: 0.4000 mg | SUBLINGUAL_TABLET | SUBLINGUAL | Status: DC | PRN

## 2015-03-18 NOTE — Patient Instructions (Signed)
We are getting an ultrasound of your testicle.  We are also referring you to a heart doctor.  Come back and see me in 6 weeks.    You may or may not need another surgery for your hernia.  ------------------------------------------------------------------------------  I DO NOT SPEAK ANY ENGLISH.  MY DOCTOR WANTS ME TO GET AN ULTRASOUND.  PLEASE HELP ME FIND THE RADIOLOGY DEPARTMENT.

## 2015-03-18 NOTE — Assessment & Plan Note (Signed)
Checking labs today.   He is upset with his resettlement agency and would like better housing.  See below.

## 2015-03-18 NOTE — Assessment & Plan Note (Signed)
Fairly typical chest pain.  Better with NTG.   Did not have any NTG at home.  Prescription provided today.   Has had normal stress myoview while in-house.   Usually would start him on beta blocker for questionable history of CAD while in [place].  However his blood pressures have been low, as has his pulse.  Defer to cardiology for further recommendations.

## 2015-03-18 NOTE — Assessment & Plan Note (Signed)
Awaiting for testicular ultrasound.  Pending results, will send to surgery for possible revision.

## 2015-03-18 NOTE — Progress Notes (Signed)
Subjective:    Aaron Reyes is a 39 y.o. male who presents to [facility] today for FU for chest pain:  1.  Chest pain:  The patient had 1 episode of chest pain this morning after becoming upset about not finding a ride to clinic today. He was told by the resettlement agency to take the bus, though the patient is unfamiliar with the busing system. He took his daily ASA this morning and states the pain went away. The agency helped him get to clinic today. No other episodes of chest pain since the previous visit 2 days ago.   2.  Groin pain:  Hernia repair >5 years ago.  Did well until last few months.  Has noted bulging in groin and mass in testicle.  Testicular mass is painful.  No dysuria.  No skin lesions.    ROS as above per HPI, otherwise neg.  Pertinently, no current chest pain, palpitations, SOB, Fever, Chills, Abd pain, N/V/D.   The following portions of the patient's history were reviewed and updated as appropriate: allergies, current medications, past medical history, family and social history, and problem list. Patient is a nonsmoker.    PMH reviewed.  Past Medical History  Diagnosis Date  . Coronary artery disease     Cath in [place] and [place] but no PCI done  . Kidney stone    Past Surgical History  Procedure Laterality Date  . Hernia repair    . Cardiac catheterization      x 2    Medications reviewed. Current Outpatient Prescriptions  Medication Sig Dispense Refill  . aspirin 325 MG tablet Take 325 mg by mouth daily.    . nitroGLYCERIN (NITROSTAT) 0.4 MG SL tablet Place 1 tablet (0.4 mg total) under the tongue every 5 (five) minutes as needed for chest pain. 50 tablet 3   No current facility-administered medications for this visit.     Objective:   Physical Exam BP 133/83 mmHg  Pulse 69  Temp(Src) 98.1 F (36.7 C) (Oral)  Ht 5' 5.5" (1.664 m)  Wt 141 lb 14.4 oz (64.365 kg)  BMI 23.25 kg/m2 Gen:  Alert, cooperative patient who appears stated age in no acute  distress.  Vital signs reviewed. HEENT: EOMI,  MMM Cardiac:  Regular rate and rhythm without murmur auscultated.  Good S1/S2. Pulm:  Clear to auscultation bilaterally with good air movement.  No wheezes or rales noted.   Abd:  Soft/nondistended/nontender.  Good bowel sounds throughout all four quadrants.  No masses noted.  Exts: Non edematous BL  LE, warm and well perfused.  GU:  Circumcised male with Right hernia noted.  Reducible.  Nontender.  Also with testicular mass noted Right scrotum.  TTP.  No redness or edema noted.  Neuro:  Alert and oriented to person, place, and date.  No focal deficits noted.   Psych:  Pleasant and conversant.  Not depressed or anxious appearing.    No results found for this or any previous visit (from the past 72 hour(s)).

## 2015-03-18 NOTE — Assessment & Plan Note (Signed)
Likely varicocele.  Sending for ultrasound today.   Will call patient with results.

## 2015-03-18 NOTE — Assessment & Plan Note (Signed)
States house if very dirty, "holes in the floor" and "lots of bugs."  Will contact the [employer] to see if they can help with this.

## 2015-03-19 DIAGNOSIS — Z9889 Other specified postprocedural states: Secondary | ICD-10-CM

## 2015-03-19 HISTORY — DX: Other specified postprocedural states: Z98.890

## 2015-03-19 LAB — QUANTIFERON TB GOLD ASSAY (BLOOD)
Interferon Gamma Release Assay: POSITIVE — AB
Mitogen value: >10 IU/mL
QUANTIFERON NIL VALUE: 0.03 [IU]/mL
Quantiferon Tb Ag Minus Nil Value: 8.95 IU/mL
TB Ag value: 8.98 IU/mL

## 2015-03-19 LAB — HEPATITIS B SURFACE ANTIGEN: HEP B S AG: NEGATIVE

## 2015-03-19 LAB — HIV ANTIBODY (ROUTINE TESTING W REFLEX): HIV 1&2 Ab, 4th Generation: NONREACTIVE

## 2015-03-19 LAB — SICKLE CELL SCREEN: Sickle Cell Screen: NEGATIVE

## 2015-03-21 LAB — URINE CYTOLOGY ANCILLARY ONLY
Chlamydia: NEGATIVE
NEISSERIA GONORRHEA: NEGATIVE

## 2015-03-21 LAB — VARICELLA ZOSTER ANTIBODY, IGG: Varicella IgG: 229 Index — ABNORMAL HIGH (ref <135.00)

## 2015-03-22 ENCOUNTER — Encounter: Payer: Self-pay | Admitting: Family Medicine

## 2015-03-29 ENCOUNTER — Ambulatory Visit (HOSPITAL_COMMUNITY)
Admission: RE | Admit: 2015-03-29 | Discharge: 2015-03-29 | Disposition: A | Discharge status: Home / Self Care | Payer: Medicaid Other | Source: Ambulatory Visit | Attending: Family Medicine | Admitting: Family Medicine

## 2015-03-29 DIAGNOSIS — N5089 Other specified disorders of the male genital organs: Secondary | ICD-10-CM | POA: Diagnosis present

## 2015-03-29 DIAGNOSIS — N503 Cyst of epididymis: Secondary | ICD-10-CM | POA: Diagnosis not present

## 2015-03-30 ENCOUNTER — Encounter: Payer: Self-pay | Admitting: Family Medicine

## 2015-04-06 ENCOUNTER — Ambulatory Visit (INDEPENDENT_AMBULATORY_CARE_PROVIDER_SITE_OTHER): Payer: Medicaid Other | Admitting: Cardiology

## 2015-04-06 ENCOUNTER — Encounter: Payer: Self-pay | Admitting: Cardiology

## 2015-04-06 VITALS — BP 120/80 | HR 90 | Ht 65.5 in | Wt 144.0 lb

## 2015-04-06 DIAGNOSIS — Z01812 Encounter for preprocedural laboratory examination: Secondary | ICD-10-CM

## 2015-04-06 DIAGNOSIS — R079 Chest pain, unspecified: Secondary | ICD-10-CM

## 2015-04-06 MED ORDER — METOPROLOL SUCCINATE ER 25 MG PO TB24: 25.0000 mg | ORAL_TABLET | Freq: Every day | ORAL | Status: DC

## 2015-04-06 MED ORDER — NITROGLYCERIN 0.4 MG SL SUBL: 0.4000 mg | SUBLINGUAL_TABLET | SUBLINGUAL | Status: DC | PRN

## 2015-04-06 NOTE — Progress Notes (Signed)
Cardiology Office Note   Date:  04/06/2015   ID:  Aaron PippinsIbrahim A Reyes, DOB [birth year], MRN [id number]  PCP:  Delynn FlavinAshly Gottschalk, DO  Cardiologist:  Dr. Mayford Knifeurner    Chief Complaint  Patient presents with  . Chest Pain    with activity or stress      History of Present Illness: Aaron Pippinsbrahim A Reyes is a 39 y.o. male who presents for cardiac eval for continued chest pain.  He has interpreter with him.     Pt was hospitalized in August with chest pain.  He and his family is a refugee from [name].  He has a hx of CAD needing cardiac catheterization 4-.5 years ago in [name] but nothing was done he was given NTG sl.  Then 2 years ago in [place] with cath he was told it was heredity his coronary disease.  He stated he has lost all his medical records. He was taking aspirin on occasion.  He had a myoview  At [facility] that was normal with EF 45-54%.  Troponins neg.   Echo: Study Conclusions - Left ventricle: The cavity size was normal. Systolic function was normal. The estimated ejection fraction was in the range of 55% to 60%. Wall motion was normal; there were no regional wall motion abnormalities. - Tricuspid valve: There was trivial regurgitation.   Today pt is back for follow up.  He continues with chest pain, with exertion, climbing ladders, he has lt ant chest pain and radiation down Lt arm.  Associated with SOB.  No nausea.  NTG relieves the pain.  He has pain with sexual activity as well.     He complains of kidney stone as well.  Has passed a small stone.  He has decreased his tobacco to 3 cigarettes per day.    Pt also stressed about work, he was told if he could not work then his wife would have to work, but they have 5 children which makes her working difficult.  He is stressed about their living conditions and his PCP was going to notify [employer] housing authority.   Past Medical History  Diagnosis Date  . Coronary artery disease     Cath in [place] and [place] but no PCI done    . Kidney stone     Past Surgical History  Procedure Laterality Date  . Hernia repair    . Cardiac catheterization      x 2     Current Outpatient Prescriptions  Medication Sig Dispense Refill  . aspirin 325 MG tablet Take 325 mg by mouth daily.    . nitroGLYCERIN (NITROSTAT) 0.4 MG SL tablet Place 1 tablet (0.4 mg total) under the tongue every 5 (five) minutes as needed for chest pain. 20 tablet 3  . metoprolol succinate (TOPROL XL) 25 MG 24 hr tablet Take 1 tablet (25 mg total) by mouth daily. 30 tablet 11   No current facility-administered medications for this visit.    Allergies:   Review of patient's allergies indicates no known allergies.    Social History:  The patient  reports that he has been smoking Cigarettes.  He has been smoking about 0.25 packs per day. He has never used smokeless tobacco. He reports that he does not drink alcohol or use illicit drugs.   Family History:  The patient's family history includes CAD in his father; Cancer in his mother; Diabetes in his mother; Heart attack in his father; Hypertension in his father and mother; Stroke in his father.  ROS:  General:no colds or fevers, no weight changes Skin:no rashes or ulcers HEENT:no blurred vision, no congestion CV:see HPI PUL:see HPI GI:no diarrhea constipation or melena, no indigestion GU:no hematuria, no dysuria MS:no joint pain, no claudication Neuro:no syncope, no lightheadedness Endo:no diabetes, no thyroid disease  Wt Readings from Last 3 Encounters:  04/06/15 144 lb (65.318 kg)  03/18/15 141 lb 14.4 oz (64.365 kg)  03/16/15 139 lb 9.6 oz (63.322 kg)     PHYSICAL EXAM: VS:  BP 120/80 mmHg  Pulse 90  Ht 5' 5.5" (1.664 m)  Wt 144 lb (65.318 kg)  BMI 23.59 kg/m2 , BMI Body mass index is 23.59 kg/(m^2). General:Pleasant affect, NAD Skin:Warm and dry, brisk capillary refill, scars on his Lt arm HEENT:normocephalic, sclera clear, mucus membranes moist Neck:supple, no JVD, no bruits   Heart:S1S2 RRR without murmur, gallup, rub or click Lungs:clear without rales, rhonchi, or wheezes Abd:soft, non tender, + BS, do not palpate liver spleen or masses Ext:no lower ext edema, 2+ pedal pulses, 2+ radial pulses Neuro:alert and oriented X 3, MAE, follows commands, + facial symmetry    EKG:  EKG is ordered today. The ekg ordered today demonstrates SR rt atrial enlargement non specific T wave abnormality.  No acute changes from previous.   Recent Labs: 03/18/2015: BUN 23; Creat 0.74; Hemoglobin 15.7; Platelets 235; Potassium 4.3; Sodium 137    Lipid Panel    Component Value Date/Time   CHOL 188 02/15/2015 0510   TRIG 116 02/15/2015 0510   HDL 36* 02/15/2015 0510   CHOLHDL 5.2 02/15/2015 0510   VLDL 23 02/15/2015 0510   LDLCALC 129* 02/15/2015 0510       Other studies Reviewed: Additional studies/ records that were reviewed today include: stress test echo PCP notes, hospital notes.    ASSESSMENT AND PLAN:  1.  Chest pain, recent neg. nuc study. Continued pain responsive to NTG.  Unstable angina.  Discussed with Dr. Turner - Plan for cardiac cath.  Pt agreeable.  Discussed with pt through interpreter.  The patient understands that risks included but are not limited to stroke (1 in 1000), death (1 in 1000), kidney failure [usually temporary] (1 in 500), bleeding (1 in 200), allergic reaction [possibly serious] (1 in 200).   I added toprol 25 mg daily HR today is 90. We refilled his NTG. Other meds will be added depending on cath.   This may be coronary spasm, CAD or anomaly to Coronary artery.  I have written note excusing from work until the 29th - allowing for cath and recovery.  I also asked that his wife not work due to his needs - I explained all I could do was write the letter.          Current medicines are reviewed with the patient today.  The patient Has no concerns regarding medicines.  The following changes have been made:  See above Labs/ tests ordered  today include:see above  Disposition:   FU:  see above  Signed, Yeimi Debnam R, NP  04/06/2015 3:57 PM    [facility] [address], [place], [place]  [address]/ [address] [place], [place] Phone: [phone]; Fax: [phone]  [phone]   

## 2015-04-06 NOTE — Patient Instructions (Addendum)
Medication Instructions:  Your physician has recommended you make the following change in your medication:   1- Start Metoprolol XL 25 mg by mouth daily   Labwork: Your physician recommends that you have lab work done today, BMET, CBC, and INR/PT  Testing/Procedures: Your physician has requested that you have a cardiac catheterization on 04/11/2015. Cardiac catheterization is used to diagnose and/or treat various heart conditions. Doctors may recommend this procedure for a number of different reasons. The most common reason is to evaluate chest pain. Chest pain can be a symptom of coronary artery disease (CAD), and cardiac catheterization can show whether plaque is narrowing or blocking your heart's arteries. This procedure is also used to evaluate the valves, as well as measure the blood flow and oxygen levels in different parts of your heart. For further information please visit [email or web address]. Please follow instruction sheet, as given.  Follow-Up: Your physician recommends that you schedule a follow-up appointment in: 2 to 3 weeks with Dr. Mayford Knifeurner.

## 2015-04-11 ENCOUNTER — Emergency Department (HOSPITAL_COMMUNITY): Payer: Medicaid Other

## 2015-04-11 ENCOUNTER — Ambulatory Visit (HOSPITAL_COMMUNITY)
Admission: RE | Admit: 2015-04-11 | Discharge: 2015-04-11 | Disposition: A | Discharge status: Home / Self Care | Payer: Medicaid Other | Source: Ambulatory Visit | Attending: Interventional Cardiology | Admitting: Interventional Cardiology

## 2015-04-11 ENCOUNTER — Encounter (HOSPITAL_COMMUNITY): Payer: Self-pay | Admitting: *Deleted

## 2015-04-11 ENCOUNTER — Emergency Department (HOSPITAL_COMMUNITY)
Admission: EM | Admit: 2015-04-11 | Discharge: 2015-04-12 | Disposition: A | Discharge status: Home / Self Care | Payer: Medicaid Other | Attending: Emergency Medicine | Admitting: Emergency Medicine

## 2015-04-11 ENCOUNTER — Encounter (HOSPITAL_COMMUNITY)
Admission: RE | Disposition: A | Discharge status: Home / Self Care | Payer: Medicaid Other | Source: Ambulatory Visit | Attending: Interventional Cardiology

## 2015-04-11 DIAGNOSIS — R079 Chest pain, unspecified: Secondary | ICD-10-CM | POA: Diagnosis not present

## 2015-04-11 DIAGNOSIS — Z720 Tobacco use: Secondary | ICD-10-CM | POA: Insufficient documentation

## 2015-04-11 DIAGNOSIS — R42 Dizziness and giddiness: Secondary | ICD-10-CM | POA: Insufficient documentation

## 2015-04-11 DIAGNOSIS — R109 Unspecified abdominal pain: Secondary | ICD-10-CM | POA: Diagnosis not present

## 2015-04-11 DIAGNOSIS — R0789 Other chest pain: Secondary | ICD-10-CM | POA: Insufficient documentation

## 2015-04-11 DIAGNOSIS — R6884 Jaw pain: Secondary | ICD-10-CM | POA: Insufficient documentation

## 2015-04-11 DIAGNOSIS — R110 Nausea: Secondary | ICD-10-CM | POA: Insufficient documentation

## 2015-04-11 DIAGNOSIS — F17210 Nicotine dependence, cigarettes, uncomplicated: Secondary | ICD-10-CM | POA: Insufficient documentation

## 2015-04-11 DIAGNOSIS — Z7982 Long term (current) use of aspirin: Secondary | ICD-10-CM | POA: Diagnosis not present

## 2015-04-11 DIAGNOSIS — N200 Calculus of kidney: Secondary | ICD-10-CM | POA: Insufficient documentation

## 2015-04-11 DIAGNOSIS — F419 Anxiety disorder, unspecified: Secondary | ICD-10-CM | POA: Diagnosis not present

## 2015-04-11 DIAGNOSIS — Z9889 Other specified postprocedural states: Secondary | ICD-10-CM | POA: Diagnosis not present

## 2015-04-11 DIAGNOSIS — R0602 Shortness of breath: Secondary | ICD-10-CM | POA: Insufficient documentation

## 2015-04-11 HISTORY — PX: CARDIAC CATHETERIZATION: SHX172

## 2015-04-11 LAB — I-STAT TROPONIN, ED: TROPONIN I, POC: 0.01 ng/mL (ref 0.00–0.08)

## 2015-04-11 LAB — CBC
HCT: 44.8 % (ref 39.0–52.0)
HEMATOCRIT: 46.7 % (ref 39.0–52.0)
HEMOGLOBIN: 16.2 g/dL (ref 13.0–17.0)
HEMOGLOBIN: 16 g/dL (ref 13.0–17.0)
MCH: 29.6 pg (ref 26.0–34.0)
MCH: 30.2 pg (ref 26.0–34.0)
MCHC: 34.7 g/dL (ref 30.0–36.0)
MCHC: 35.7 g/dL (ref 30.0–36.0)
MCV: 85.4 fL (ref 78.0–100.0)
MCV: 84.7 fL (ref 78.0–100.0)
Platelets: 216 10*3/uL (ref 150–400)
Platelets: 221 10*3/uL (ref 150–400)
RBC: 5.47 MIL/uL (ref 4.22–5.81)
RBC: 5.29 MIL/uL (ref 4.22–5.81)
RDW: 12.1 % (ref 11.5–15.5)
RDW: 12.2 % (ref 11.5–15.5)
WBC: 7 10*3/uL (ref 4.0–10.5)
WBC: 10.1 10*3/uL (ref 4.0–10.5)

## 2015-04-11 LAB — BASIC METABOLIC PANEL
ANION GAP: 8 (ref 5–15)
Anion gap: 12 (ref 5–15)
BUN: 13 mg/dL (ref 6–20)
BUN: 16 mg/dL (ref 6–20)
CALCIUM: 8.8 mg/dL — AB (ref 8.9–10.3)
CHLORIDE: 104 mmol/L (ref 101–111)
CHLORIDE: 102 mmol/L (ref 101–111)
CO2: 25 mmol/L (ref 22–32)
CO2: 20 mmol/L — AB (ref 22–32)
CREATININE: 0.73 mg/dL (ref 0.61–1.24)
CREATININE: 1.24 mg/dL (ref 0.61–1.24)
Calcium: 9.2 mg/dL (ref 8.9–10.3)
GFR calc Af Amer: >60 mL/min (ref >60)
GFR calc non Af Amer: >60 mL/min (ref >60)
GFR calc non Af Amer: >60 mL/min (ref >60)
GLUCOSE: 106 mg/dL — AB (ref 65–99)
GLUCOSE: 178 mg/dL — AB (ref 65–99)
POTASSIUM: 3.5 mmol/L (ref 3.5–5.1)
Potassium: 3.9 mmol/L (ref 3.5–5.1)
Sodium: 137 mmol/L (ref 135–145)
Sodium: 134 mmol/L — ABNORMAL LOW (ref 135–145)

## 2015-04-11 LAB — PROTIME-INR
INR: 1 (ref 0.00–1.49)
Prothrombin Time: 13.4 seconds (ref 11.6–15.2)

## 2015-04-11 IMAGING — CR DG CHEST 2 VIEW
2 series · 2 of 2 positions shown · non-contrast
Comparison: No priors.

CLINICAL DATA: 39-year-old male with history of catheterization
procedure this morning, with intermittent chest pain throughout the
day.

EXAM:
CHEST  2 VIEW

[chest pa]
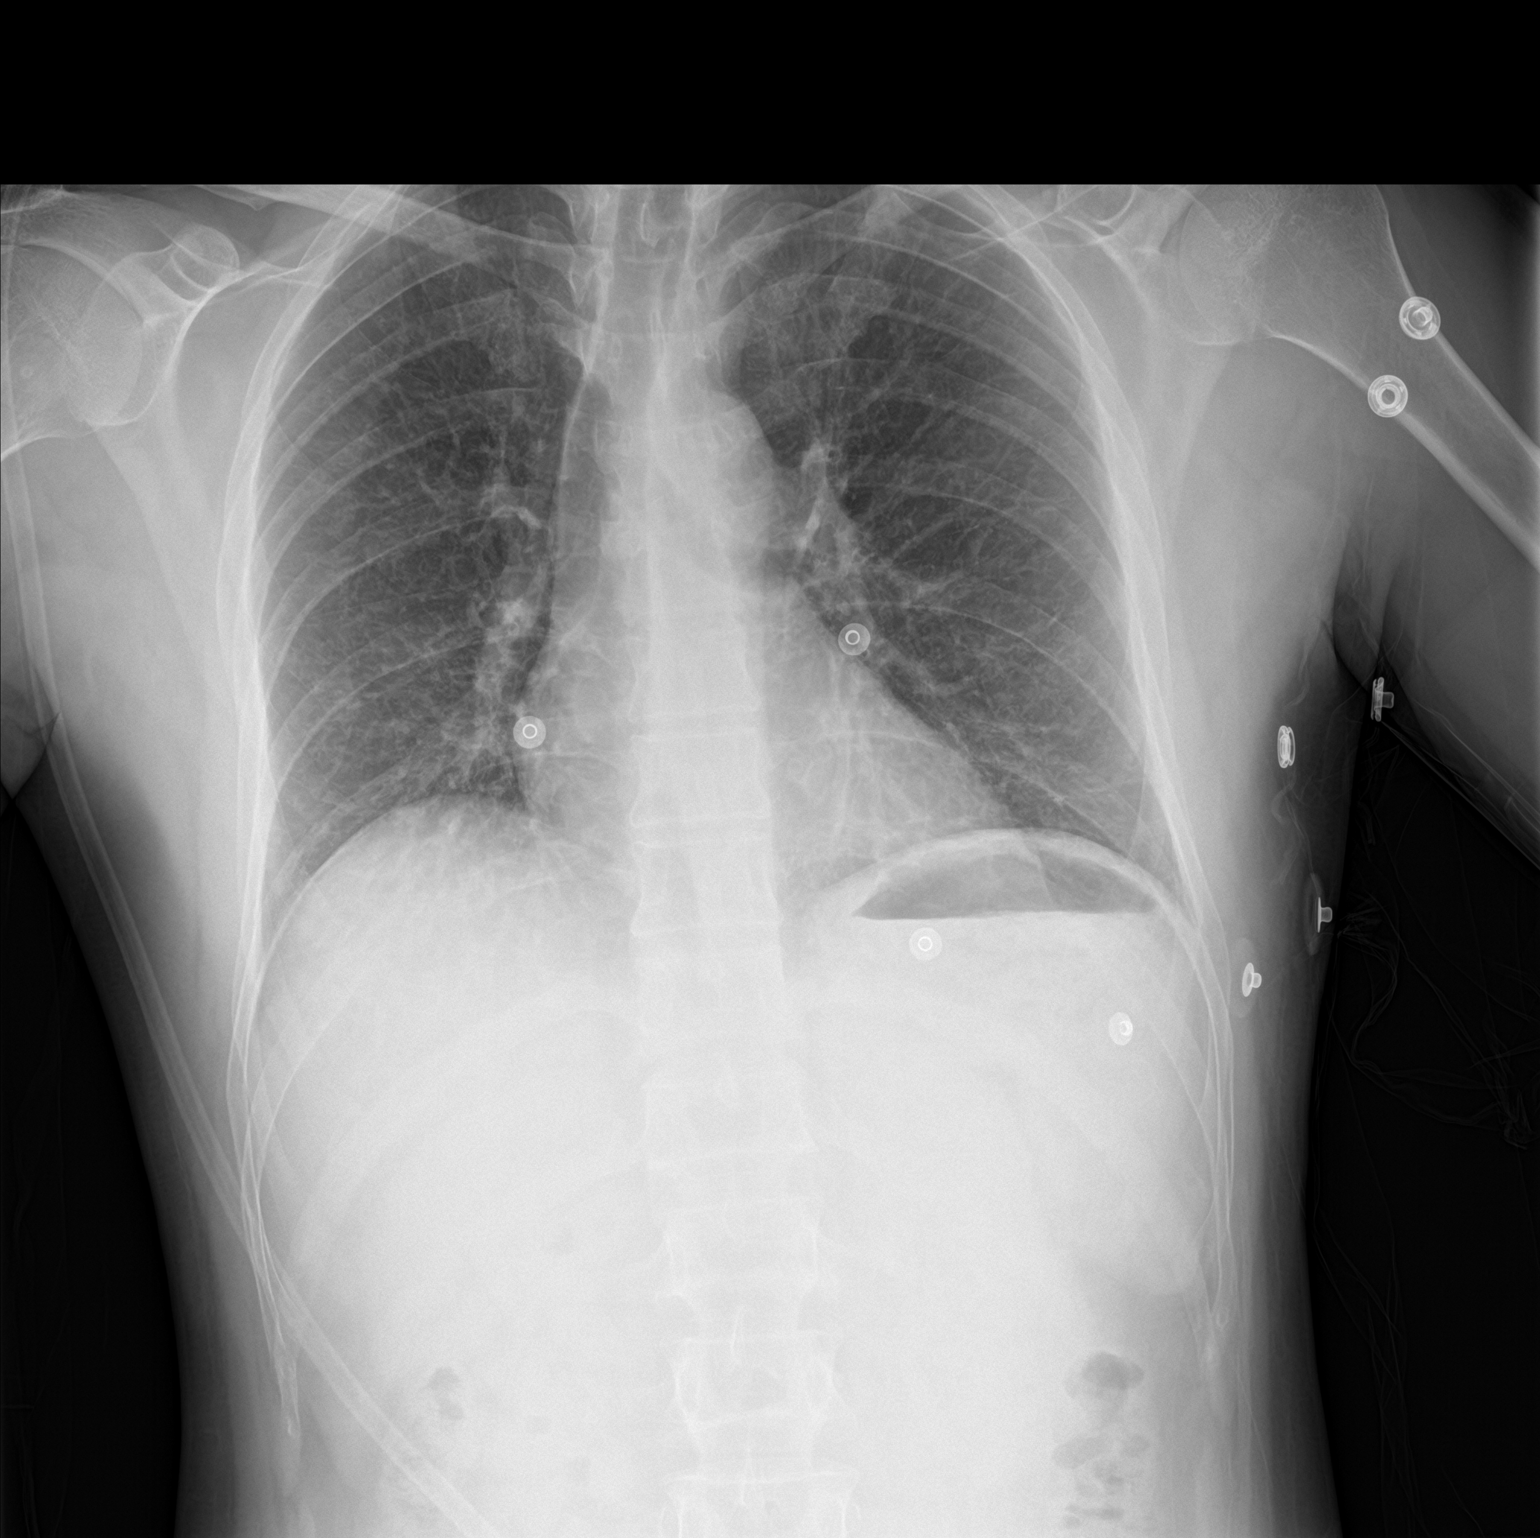

[chest lat]
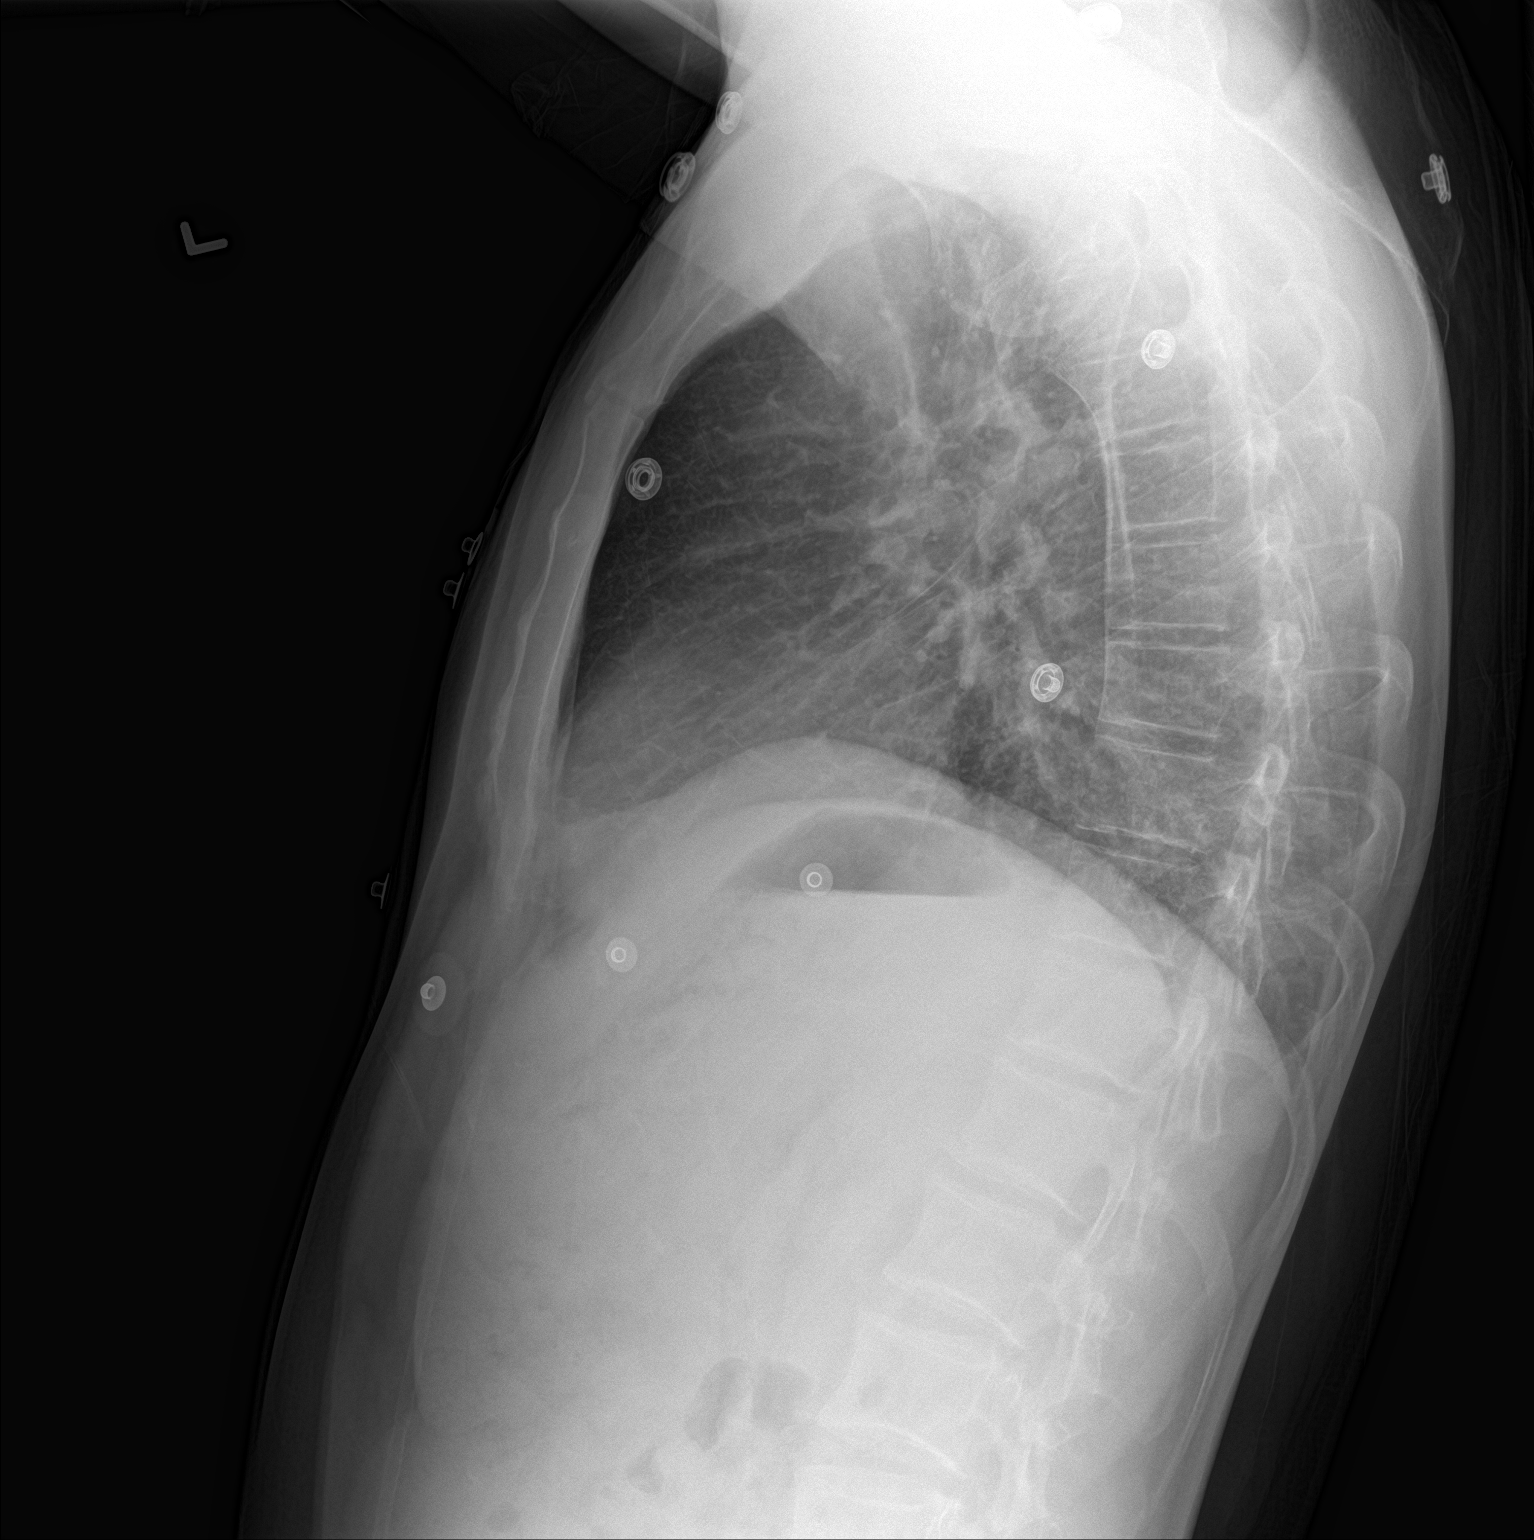

[2 of 2 positions shown; findings below may reference images not displayed]

FINDINGS: Mild diffuse peribronchial cuffing. Lung volumes are low. No
consolidative airspace disease. No pleural effusions. No
pneumothorax. No pulmonary nodule or mass noted. Pulmonary
vasculature and the cardiomediastinal silhouette are within normal
limits. Bilateral apical pleural parenchymal thickening, most
compatible with chronic post infectious or inflammatory scarring.
IMPRESSION: 1. Mild diffuse peribronchial cuffing may suggest mild bronchitis.

## 2015-04-11 SURGERY — LEFT HEART CATH AND CORONARY ANGIOGRAPHY
Anesthesia: LOCAL

## 2015-04-11 MED ORDER — SODIUM CHLORIDE 0.9 % IV SOLN: 250.0000 mL | INTRAVENOUS | Status: DC | PRN

## 2015-04-11 MED ORDER — ASPIRIN 81 MG PO CHEW
81.0000 mg | CHEWABLE_TABLET | ORAL | Status: AC
  Administered 2015-04-11: 81 mg via ORAL

## 2015-04-11 MED ORDER — MIDAZOLAM HCL 2 MG/2ML IJ SOLN
INTRAMUSCULAR | Status: DC | PRN
  Administered 2015-04-11: 1 mg via INTRAVENOUS

## 2015-04-11 MED ORDER — HEPARIN SODIUM (PORCINE) 1000 UNIT/ML IJ SOLN
INTRAMUSCULAR | Status: AC
  Filled 2015-04-11: qty 1

## 2015-04-11 MED ORDER — ACETAMINOPHEN 325 MG PO TABS: 650.0000 mg | ORAL_TABLET | ORAL | Status: DC | PRN

## 2015-04-11 MED ORDER — HEPARIN SODIUM (PORCINE) 1000 UNIT/ML IJ SOLN
INTRAMUSCULAR | Status: DC | PRN
  Administered 2015-04-11: 3500 [IU] via INTRAVENOUS

## 2015-04-11 MED ORDER — SODIUM CHLORIDE 0.9 % IJ SOLN: 3.0000 mL | Freq: Two times a day (BID) | INTRAMUSCULAR | Status: DC

## 2015-04-11 MED ORDER — ASPIRIN 81 MG PO CHEW: 324.0000 mg | CHEWABLE_TABLET | Freq: Once | ORAL | Status: DC

## 2015-04-11 MED ORDER — ASPIRIN 81 MG PO CHEW
CHEWABLE_TABLET | ORAL | Status: AC
  Filled 2015-04-11: qty 1

## 2015-04-11 MED ORDER — ONDANSETRON HCL 4 MG/2ML IJ SOLN: 4.0000 mg | Freq: Four times a day (QID) | INTRAMUSCULAR | Status: DC | PRN

## 2015-04-11 MED ORDER — HEPARIN (PORCINE) IN NACL 2-0.9 UNIT/ML-% IJ SOLN
INTRAMUSCULAR | Status: AC
  Filled 2015-04-11: qty 1000

## 2015-04-11 MED ORDER — SODIUM CHLORIDE 0.9 % IJ SOLN: 3.0000 mL | INTRAMUSCULAR | Status: DC | PRN

## 2015-04-11 MED ORDER — SODIUM CHLORIDE 0.9 % WEIGHT BASED INFUSION: 3.0000 mL/kg/h | INTRAVENOUS | Status: AC

## 2015-04-11 MED ORDER — FENTANYL CITRATE (PF) 100 MCG/2ML IJ SOLN
INTRAMUSCULAR | Status: AC
  Filled 2015-04-11: qty 4

## 2015-04-11 MED ORDER — VERAPAMIL HCL 2.5 MG/ML IV SOLN
INTRAVENOUS | Status: DC | PRN
  Administered 2015-04-11: 13:00:00 via INTRA_ARTERIAL

## 2015-04-11 MED ORDER — FENTANYL CITRATE (PF) 100 MCG/2ML IJ SOLN
INTRAMUSCULAR | Status: DC | PRN
  Administered 2015-04-11: 50 ug via INTRAVENOUS

## 2015-04-11 MED ORDER — VERAPAMIL HCL 2.5 MG/ML IV SOLN
INTRAVENOUS | Status: AC
  Filled 2015-04-11: qty 2

## 2015-04-11 MED ORDER — IOHEXOL 350 MG/ML SOLN
INTRAVENOUS | Status: DC | PRN
  Administered 2015-04-11: 80 mL via INTRA_ARTERIAL

## 2015-04-11 MED ORDER — SODIUM CHLORIDE 0.9 % WEIGHT BASED INFUSION: 1.0000 mL/kg/h | INTRAVENOUS | Status: DC

## 2015-04-11 MED ORDER — LIDOCAINE HCL (PF) 1 % IJ SOLN
INTRAMUSCULAR | Status: AC
  Filled 2015-04-11: qty 30

## 2015-04-11 MED ORDER — MIDAZOLAM HCL 2 MG/2ML IJ SOLN
INTRAMUSCULAR | Status: AC
  Filled 2015-04-11: qty 4

## 2015-04-11 MED ORDER — NITROGLYCERIN 2 % TD OINT
0.5000 [in_us] | TOPICAL_OINTMENT | Freq: Once | TRANSDERMAL | Status: AC
Start: 2015-04-12 — End: 2015-04-12
  Administered 2015-04-12: 0.5 [in_us] via TOPICAL
  Filled 2015-04-11: qty 1

## 2015-04-11 SURGICAL SUPPLY — 10 items
CATH INFINITI 5 FR AR2 MOD (CATHETERS) ×2 IMPLANT
CATH INFINITI 5 FR JL3.5 (CATHETERS) ×2 IMPLANT
CATH INFINITI JR4 5F (CATHETERS) ×2 IMPLANT
DEVICE RAD COMP TR BAND LRG (VASCULAR PRODUCTS) ×2 IMPLANT
GLIDESHEATH SLEND A-KIT 6F 22G (SHEATH) ×2 IMPLANT
KIT HEART LEFT (KITS) ×2 IMPLANT
PACK CARDIAC CATHETERIZATION (CUSTOM PROCEDURE TRAY) ×2 IMPLANT
TRANSDUCER W/STOPCOCK (MISCELLANEOUS) ×2 IMPLANT
TUBING CIL FLEX 10 FLL-RA (TUBING) ×2 IMPLANT
WIRE SAFE-T 1.5MM-J .035X260CM (WIRE) ×2 IMPLANT

## 2015-04-11 NOTE — ED Provider Notes (Signed)
CSN: [id number]     Arrival date & time 04/11/15  2217 History  By signing my name below, I, Freida Busman, attest that this documentation has been prepared under the direction and in the presence of Loren Racer, MD . Electronically Signed: Freida Busman, Scribe. 04/11/2015. 11:49 PM.    Chief Complaint  Patient presents with  . Chest Pain   The history is provided by the patient. A language interpreter was used.    HPI Comments:  Aaron Reyes is a 39 y.o. male who presents to the Emergency Department complaining of a sharp, intermittent, cramping, pain to his left chest since ~ 2030 this evening. Pt notes his pain radiates to his left neck and head. His pain was a  8/10 at onset and is a  6/10 at this time. He took 324 ASA and 1 NTG PTA. Pt had a cardiac catheterization this AM which demonstrated normal coronary arteries; when he went home this evening he became dizzy after using the bathroom and fell; no head injury or LOC. He reports associated SOB and nausea. He denies cough, fever, and vomiting. No alleviating factors noted. Patient has had 2 prior catheterizations in the past with no PCI.  Pt does not speak english, native language is Arabic, history translated using language line.   History reviewed. No pertinent past medical history. Past Surgical History  Procedure Laterality Date  . Cardiac catheterization     No family history on file. Social History  Substance Use Topics  . Smoking status: Current Every Day Smoker -- 0.20 packs/day    Types: Cigarettes  . Smokeless tobacco: None  . Alcohol Use: No    Review of Systems  Constitutional: Negative for fever and chills.  Respiratory: Positive for shortness of breath. Negative for cough.   Cardiovascular: Positive for chest pain. Negative for palpitations and leg swelling.  Gastrointestinal: Positive for nausea and abdominal pain ("kidney pain"). Negative for vomiting and diarrhea.  Genitourinary: Negative for  dysuria, frequency, hematuria and difficulty urinating.  Musculoskeletal: Negative for myalgias, back pain, neck pain and neck stiffness.  Neurological: Positive for dizziness and light-headedness. Negative for syncope, weakness, numbness and headaches.  Psychiatric/Behavioral: The patient is nervous/anxious.   All other systems reviewed and are negative.  Allergies  Review of patient's allergies indicates no known allergies.  Home Medications   Prior to Admission medications   Not on File   BP 117/72 mmHg  Pulse 61  Resp 18  SpO2 100% Physical Exam  Constitutional: He is oriented to person, place, and time. He appears well-developed and well-nourished. No distress.  Anxious appearing  HENT:  Head: Normocephalic and atraumatic.  Mouth/Throat: Oropharynx is clear and moist. No oropharyngeal exudate.  Eyes: EOM are normal. Pupils are equal, round, and reactive to light.  Neck: Normal range of motion. Neck supple.  Cardiovascular: Normal rate and regular rhythm.  Exam reveals no gallop and no friction rub.   No murmur heard. Pulmonary/Chest: Effort normal and breath sounds normal. No respiratory distress. He has no wheezes. He has no rales. He exhibits no tenderness.  Abdominal: Soft. Bowel sounds are normal. He exhibits no distension and no mass. There is no tenderness. There is no rebound and no guarding.  Musculoskeletal: Normal range of motion. He exhibits no edema or tenderness.  No CVA tenderness. No lower extremity swelling or pain. Patient is a bandage on the right radial cath site. There is no swelling, warmth or redness. Distal cap refill is normal.  Neurological:  He is alert and oriented to person, place, and time.  Moves all extremities without deficit. Sensation is fully intact.  Skin: Skin is warm and dry. No rash noted. No erythema.  Psychiatric: He has a normal mood and affect. His behavior is normal.  Nursing note and vitals reviewed.   ED Course  Procedures    DIAGNOSTIC STUDIES:  Oxygen Saturation is 98% on RA, normal by my interpretation.    COORDINATION OF CARE:  11:47 PM Discussed treatment plan with pt at bedside and pt agreed to plan.  Labs Review Labs Reviewed  BASIC METABOLIC PANEL - Abnormal; Notable for the following:    Sodium 134 (*)    CO2 20 (*)    Glucose, Bld 178 (*)    Calcium 8.8 (*)    All other components within normal limits  I-STAT TROPOININ, ED - Abnormal; Notable for the following:    Troponin i, poc 0.09 (*)    All other components within normal limits  CBC  D-DIMER, QUANTITATIVE (NOT AT [facility])  I-STAT TROPOININ, ED  Rosezena SensorI-STAT TROPOININ, ED    Imaging Review Dg Chest 2 View  04/11/2015  CLINICAL DATA:  39 year old male with history of catheterization procedure this morning, with intermittent chest pain throughout the day. EXAM: CHEST  2 VIEW COMPARISON:  No priors. FINDINGS: Mild diffuse peribronchial cuffing. Lung volumes are low. No consolidative airspace disease. No pleural effusions. No pneumothorax. No pulmonary nodule or mass noted. Pulmonary vasculature and the cardiomediastinal silhouette are within normal limits. Bilateral apical pleural parenchymal thickening, most compatible with chronic post infectious or inflammatory scarring. IMPRESSION: 1. Mild diffuse peribronchial cuffing may suggest mild bronchitis. Electronically Signed   By: Trudie Reedaniel  Entrikin M.D.   On: 04/11/2015 23:13   I have personally reviewed and evaluated these images and lab results as part of my medical decision-making.   EKG Interpretation   Date/Time:  Monday April 11 2015 22:29:47 EDT Ventricular Rate:  90 PR Interval:  132 QRS Duration: 84 QT Interval:  341 QTC Calculation: 417 R Axis:   51 Text Interpretation:  Age not entered, assumed to be  39 years old for  purpose of ECG interpretation Sinus rhythm Conside LVH no acute changes No  old tracing to compare Confirmed by Rhunette CroftNANAVATI, MD, Janey GentaANKIT [phone]([id number]) on  04/11/2015  10:55:13 PM Also confirmed by Ranae PalmsYELVERTON  MD, Macy Polio ([phone])  on  04/11/2015 11:08:01 PM      MDM   Final diagnoses:  Chest pain, unspecified chest pain type   I personally performed the services described in this documentation, which was scribed in my presence. The recorded information has been reviewed and is accurate.    Discussed with Dr. Shirlee LatchMcLean for cardiology. Patient with recent negative cardiac catheterization. Initial troponin was normal. Delta troponin was mildly elevated at 0.09. Dr. Shirlee LatchMcLean recommended repeat troponin. If not trending up believes the patient can be safely discharged home.  Patient's third troponin is 0.0. He is resting comfortably.   Patient is a refugee and states that he has been living in the [place] for 1 month. He is upset about his living conditions saying that there are bedbugs and mold in his apartment building. I believe this anxiety is likely contributing to his chest pain. Patient signed out to oncoming emergency physician pending social work consult.  Loren Raceravid Savanah Bayles, MD 04/12/15 [phone]

## 2015-04-11 NOTE — Care Management (Addendum)
ED CM spoke with Cassie RN on Pod B, regarding patient, He is a refugee living in a [place] community [place] in [place]. He came in tonight complaining of infestation of beg bugs and mold in the apartment he resides in, patient appeared extremely upset [facility] to [facility]. CM will contact Lisabeth PickLelia Moore from [facility].

## 2015-04-11 NOTE — H&P (View-Only) (Signed)
Cardiology Office Note   Date:  04/06/2015   ID:  Aaron PippinsIbrahim A Gritz, DOB [birth year], MRN [id number]  PCP:  Delynn FlavinAshly Gottschalk, DO  Cardiologist:  Dr. Mayford Knifeurner    Chief Complaint  Patient presents with  . Chest Pain    with activity or stress      History of Present Illness: Aaron Reyes is a 39 y.o. male who presents for cardiac eval for continued chest pain.  He has interpreter with him.     Pt was hospitalized in August with chest pain.  He and his family is a refugee from [name].  He has a hx of CAD needing cardiac catheterization 4-.5 years ago in [name] but nothing was done he was given NTG sl.  Then 2 years ago in [place] with cath he was told it was heredity his coronary disease.  He stated he has lost all his medical records. He was taking aspirin on occasion.  He had a myoview  At [facility] that was normal with EF 45-54%.  Troponins neg.   Echo: Study Conclusions - Left ventricle: The cavity size was normal. Systolic function was normal. The estimated ejection fraction was in the range of 55% to 60%. Wall motion was normal; there were no regional wall motion abnormalities. - Tricuspid valve: There was trivial regurgitation.   Today pt is back for follow up.  He continues with chest pain, with exertion, climbing ladders, he has lt ant chest pain and radiation down Lt arm.  Associated with SOB.  No nausea.  NTG relieves the pain.  He has pain with sexual activity as well.     He complains of kidney stone as well.  Has passed a small stone.  He has decreased his tobacco to 3 cigarettes per day.    Pt also stressed about work, he was told if he could not work then his wife would have to work, but they have 5 children which makes her working difficult.  He is stressed about their living conditions and his PCP was going to notify [employer] housing authority.   Past Medical History  Diagnosis Date  . Coronary artery disease     Cath in [place] and [place] but no PCI done    . Kidney stone     Past Surgical History  Procedure Laterality Date  . Hernia repair    . Cardiac catheterization      x 2     Current Outpatient Prescriptions  Medication Sig Dispense Refill  . aspirin 325 MG tablet Take 325 mg by mouth daily.    . nitroGLYCERIN (NITROSTAT) 0.4 MG SL tablet Place 1 tablet (0.4 mg total) under the tongue every 5 (five) minutes as needed for chest pain. 20 tablet 3  . metoprolol succinate (TOPROL XL) 25 MG 24 hr tablet Take 1 tablet (25 mg total) by mouth daily. 30 tablet 11   No current facility-administered medications for this visit.    Allergies:   Review of patient's allergies indicates no known allergies.    Social History:  The patient  reports that he has been smoking Cigarettes.  He has been smoking about 0.25 packs per day. He has never used smokeless tobacco. He reports that he does not drink alcohol or use illicit drugs.   Family History:  The patient's family history includes CAD in his father; Cancer in his mother; Diabetes in his mother; Heart attack in his father; Hypertension in his father and mother; Stroke in his father.  ROS:  General:no colds or fevers, no weight changes Skin:no rashes or ulcers HEENT:no blurred vision, no congestion CV:see HPI PUL:see HPI GI:no diarrhea constipation or melena, no indigestion GU:no hematuria, no dysuria MS:no joint pain, no claudication Neuro:no syncope, no lightheadedness Endo:no diabetes, no thyroid disease  Wt Readings from Last 3 Encounters:  04/06/15 144 lb (65.318 kg)  03/18/15 141 lb 14.4 oz (64.365 kg)  03/16/15 139 lb 9.6 oz (63.322 kg)     PHYSICAL EXAM: VS:  BP 120/80 mmHg  Pulse 90  Ht 5' 5.5" (1.664 m)  Wt 144 lb (65.318 kg)  BMI 23.59 kg/m2 , BMI Body mass index is 23.59 kg/(m^2). General:Pleasant affect, NAD Skin:Warm and dry, brisk capillary refill, scars on his Lt arm HEENT:normocephalic, sclera clear, mucus membranes moist Neck:supple, no JVD, no bruits   Heart:S1S2 RRR without murmur, gallup, rub or click Lungs:clear without rales, rhonchi, or wheezes ZOX:WRUE, non tender, + BS, do not palpate liver spleen or masses Ext:no lower ext edema, 2+ pedal pulses, 2+ radial pulses Neuro:alert and oriented X 3, MAE, follows commands, + facial symmetry    EKG:  EKG is ordered today. The ekg ordered today demonstrates SR rt atrial enlargement non specific T wave abnormality.  No acute changes from previous.   Recent Labs: 03/18/2015: BUN 23; Creat 0.74; Hemoglobin 15.7; Platelets 235; Potassium 4.3; Sodium 137    Lipid Panel    Component Value Date/Time   CHOL 188 02/15/2015 0510   TRIG 116 02/15/2015 0510   HDL 36* 02/15/2015 0510   CHOLHDL 5.2 02/15/2015 0510   VLDL 23 02/15/2015 0510   LDLCALC 129* 02/15/2015 0510       Other studies Reviewed: Additional studies/ records that were reviewed today include: stress test echo PCP notes, hospital notes.    ASSESSMENT AND PLAN:  1.  Chest pain, recent neg. nuc study. Continued pain responsive to NTG.  Unstable angina.  Discussed with Dr. Mayford Knife - Plan for cardiac cath.  Pt agreeable.  Discussed with pt through interpreter.  The patient understands that risks included but are not limited to stroke (1 in 1000), death (1 in 1000), kidney failure [usually temporary] (1 in 500), bleeding (1 in 200), allergic reaction [possibly serious] (1 in 200).   I added toprol 25 mg daily HR today is 90. We refilled his NTG. Other meds will be added depending on cath.   This may be coronary spasm, CAD or anomaly to Coronary artery.  I have written note excusing from work until the 29th - allowing for cath and recovery.  I also asked that his wife not work due to his needs - I explained all I could do was write the letter.          Current medicines are reviewed with the patient today.  The patient Has no concerns regarding medicines.  The following changes have been made:  See above Labs/ tests ordered  today include:see above  Disposition:   FU:  see above  Signed, Leone Brand, NP  04/06/2015 3:57 PM    [facility] [address] [place], [place], [place]  [address]/ [address] [place], [place] Phone: [phone]; Fax: [phone]  [phone]

## 2015-04-11 NOTE — Discharge Instructions (Signed)
Radial Site Care °Refer to this sheet in the next few weeks. These instructions provide you with information about caring for yourself after your procedure. Your health care provider may also give you more specific instructions. Your treatment has been planned according to current medical practices, but problems sometimes occur. Call your health care provider if you have any problems or questions after your procedure. °WHAT TO EXPECT AFTER THE PROCEDURE °After your procedure, it is typical to have the following: °· Bruising at the radial site that usually fades within 1-2 weeks. °· Blood collecting in the tissue (hematoma) that may be painful to the touch. It should usually decrease in size and tenderness within 1-2 weeks. °HOME CARE INSTRUCTIONS °· Take medicines only as directed by your health care provider. °· You may shower 24-48 hours after the procedure or as directed by your health care provider. Remove the bandage (dressing) and gently wash the site with plain soap and water. Pat the area dry with a clean towel. Do not rub the site, because this may cause bleeding. °· Do not take baths, swim, or use a hot tub until your health care provider approves. °· Check your insertion site every day for redness, swelling, or drainage. °· Do not apply powder or lotion to the site. °· Do not flex or bend the affected arm for 24 hours or as directed by your health care provider. °· Do not push or pull heavy objects with the affected arm for 24 hours or as directed by your health care provider. °· Do not lift over 10 lb (4.5 kg) for 5 days after your procedure or as directed by your health care provider. °· Ask your health care provider when it is okay to: °¨ Return to work or school. °¨ Resume usual physical activities or sports. °¨ Resume sexual activity. °· Do not drive home if you are discharged the same day as the procedure. Have someone else drive you. °· You may drive 24 hours after the procedure unless otherwise  instructed by your health care provider. °· Do not operate machinery or power tools for 24 hours after the procedure. °· If your procedure was done as an outpatient procedure, which means that you went home the same day as your procedure, a responsible adult should be with you for the first 24 hours after you arrive home. °· Keep all follow-up visits as directed by your health care provider. This is important. °SEEK MEDICAL CARE IF: °· You have a fever. °· You have chills. °· You have increased bleeding from the radial site. Hold pressure on the site. °SEEK IMMEDIATE MEDICAL CARE IF: °· You have unusual pain at the radial site. °· You have redness, warmth, or swelling at the radial site. °· You have drainage (other than a small amount of blood on the dressing) from the radial site. °· The radial site is bleeding, and the bleeding does not stop after 30 minutes of holding steady pressure on the site. °· Your arm or hand becomes pale, cool, tingly, or numb. °  °This information is not intended to replace advice given to you by your health care provider. Make sure you discuss any questions you have with your health care provider. °  °Document Released: 07/07/2010 Document Revised: 06/25/2014 Document Reviewed: 12/21/2013 °Elsevier Interactive Patient Education ©2016 Elsevier Inc. ° °

## 2015-04-11 NOTE — Progress Notes (Signed)
Discharge instructions dicussed and reviewed with patient via interpreter.  Pt verbalize understanding.

## 2015-04-11 NOTE — Interval H&P Note (Signed)
History and Physical Interval Note:  04/11/2015 12:37 PM  Aaron PippinsIbrahim A Hair  has presented today for surgery, with the diagnosis of cp   [place]  The various methods of treatment have been discussed with the patient and family. After consideration of risks, benefits and other options for treatment, the patient has consented to  Procedure(s): Left Heart Cath and Coronary Angiography (N/A) as a surgical intervention .  The patient's history has been reviewed, patient examined, no change in status, stable for surgery.  I have reviewed the patient's chart and labs.  Questions were answered to the patient's satisfaction.     Lesleigh NoeSMITH III,Liani Caris W
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
## 2015-04-11 NOTE — ED Notes (Addendum)
Translator utilized throughout  Psychologist, occupationalTriage and assessment. Arabic speaking.

## 2015-04-12 ENCOUNTER — Encounter (HOSPITAL_COMMUNITY): Payer: Self-pay | Admitting: Interventional Cardiology

## 2015-04-12 LAB — I-STAT TROPONIN, ED
Troponin i, poc: 0.09 ng/mL (ref 0.00–0.08)
Troponin i, poc: 0 ng/mL (ref 0.00–0.08)

## 2015-04-12 LAB — D-DIMER, QUANTITATIVE (NOT AT ARMC): D DIMER QUANT: 0.41 ug{FEU}/mL (ref 0.00–0.48)

## 2015-04-12 MED ORDER — ACETAMINOPHEN 325 MG PO TABS
650.0000 mg | ORAL_TABLET | Freq: Once | ORAL | Status: AC
  Administered 2015-04-12: 650 mg via ORAL
  Filled 2015-04-12: qty 2

## 2015-04-12 MED ORDER — SODIUM CHLORIDE 0.9 % IV BOLUS (SEPSIS)
2000.0000 mL | Freq: Once | INTRAVENOUS | Status: AC
Start: 2015-04-12 — End: 2015-04-12
  Administered 2015-04-12: 2000 mL via INTRAVENOUS

## 2015-04-12 MED FILL — Lidocaine HCl Local Preservative Free (PF) Inj 1%: INTRAMUSCULAR | Qty: 30 | Status: AC

## 2015-04-12 NOTE — ED Notes (Signed)
Patient appears to be asleep; patient's son is sitting in recliner playing on cell phone

## 2015-04-12 NOTE — Discharge Instructions (Signed)
Nonspecific Chest Pain  °Chest pain can be caused by many different conditions. There is always a chance that your pain could be related to something serious, such as a heart attack or a blood clot in your lungs. Chest pain can also be caused by conditions that are not life-threatening. If you have chest pain, it is very important to follow up with your health care provider. °CAUSES  °Chest pain can be caused by: °· Heartburn. °· Pneumonia or bronchitis. °· Anxiety or stress. °· Inflammation around your heart (pericarditis) or lung (pleuritis or pleurisy). °· A blood clot in your lung. °· A collapsed lung (pneumothorax). It can develop suddenly on its own (spontaneous pneumothorax) or from trauma to the chest. °· Shingles infection (varicella-zoster virus). °· Heart attack. °· Damage to the bones, muscles, and cartilage that make up your chest wall. This can include: °¨ Bruised bones due to injury. °¨ Strained muscles or cartilage due to frequent or repeated coughing or overwork. °¨ Fracture to one or more ribs. °¨ Sore cartilage due to inflammation (costochondritis). °RISK FACTORS  °Risk factors for chest pain may include: °· Activities that increase your risk for trauma or injury to your chest. °· Respiratory infections or conditions that cause frequent coughing. °· Medical conditions or overeating that can cause heartburn. °· Heart disease or family history of heart disease. °· Conditions or health behaviors that increase your risk of developing a blood clot. °· Having had chicken pox (varicella zoster). °SIGNS AND SYMPTOMS °Chest pain can feel like: °· Burning or tingling on the surface of your chest or deep in your chest. °· Crushing, pressure, aching, or squeezing pain. °· Dull or sharp pain that is worse when you move, cough, or take a deep breath. °· Pain that is also felt in your back, neck, shoulder, or arm, or pain that spreads to any of these areas. °Your chest pain may come and go, or it may stay  constant. °DIAGNOSIS °Lab tests or other studies may be needed to find the cause of your pain. Your health care provider may have you take a test called an ambulatory ECG (electrocardiogram). An ECG records your heartbeat patterns at the time the test is performed. You may also have other tests, such as: °· Transthoracic echocardiogram (TTE). During echocardiography, sound waves are used to create a picture of all of the heart structures and to look at how blood flows through your heart. °· Transesophageal echocardiogram (TEE). This is a more advanced imaging test that obtains images from inside your body. It allows your health care provider to see your heart in finer detail. °· Cardiac monitoring. This allows your health care provider to monitor your heart rate and rhythm in real time. °· Holter monitor. This is a portable device that records your heartbeat and can help to diagnose abnormal heartbeats. It allows your health care provider to track your heart activity for several days, if needed. °· Stress tests. These can be done through exercise or by taking medicine that makes your heart beat more quickly. °· Blood tests. °· Imaging tests. °TREATMENT  °Your treatment depends on what is causing your chest pain. Treatment may include: °· Medicines. These may include: °¨ Acid blockers for heartburn. °¨ Anti-inflammatory medicine. °¨ Pain medicine for inflammatory conditions. °¨ Antibiotic medicine, if an infection is present. °¨ Medicines to dissolve blood clots. °¨ Medicines to treat coronary artery disease. °· Supportive care for conditions that do not require medicines. This may include: °¨ Resting. °¨ Applying heat   or cold packs to injured areas. °¨ Limiting activities until pain decreases. °HOME CARE INSTRUCTIONS °· If you were prescribed an antibiotic medicine, finish it all even if you start to feel better. °· Avoid any activities that bring on chest pain. °· Do not use any tobacco products, including  cigarettes, chewing tobacco, or electronic cigarettes. If you need help quitting, ask your health care provider. °· Do not drink alcohol. °· Take medicines only as directed by your health care provider. °· Keep all follow-up visits as directed by your health care provider. This is important. This includes any further testing if your chest pain does not go away. °· If heartburn is the cause for your chest pain, you may be told to keep your head raised (elevated) while sleeping. This reduces the chance that acid will go from your stomach into your esophagus. °· Make lifestyle changes as directed by your health care provider. These may include: °¨ Getting regular exercise. Ask your health care provider to suggest some activities that are safe for you. °¨ Eating a heart-healthy diet. A registered dietitian can help you to learn healthy eating options. °¨ Maintaining a healthy weight. °¨ Managing diabetes, if necessary. °¨ Reducing stress. °SEEK MEDICAL CARE IF: °· Your chest pain does not go away after treatment. °· You have a rash with blisters on your chest. °· You have a fever. °SEEK IMMEDIATE MEDICAL CARE IF:  °· Your chest pain is worse. °· You have an increasing cough, or you cough up blood. °· You have severe abdominal pain. °· You have severe weakness. °· You faint. °· You have chills. °· You have sudden, unexplained chest discomfort. °· You have sudden, unexplained discomfort in your arms, back, neck, or jaw. °· You have shortness of breath at any time. °· You suddenly start to sweat, or your skin gets clammy. °· You feel nauseous or you vomit. °· You suddenly feel light-headed or dizzy. °· Your heart begins to beat quickly, or it feels like it is skipping beats. °These symptoms may represent a serious problem that is an emergency. Do not wait to see if the symptoms will go away. Get medical help right away. Call your local emergency services (911 in the [place] Do not drive yourself to the hospital. °  °This  information is not intended to replace advice given to you by your health care provider. Make sure you discuss any questions you have with your health care provider. °  °Document Released: 03/14/2005 Document Revised: 06/25/2014 Document Reviewed: 01/08/2014 °Elsevier Interactive Patient Education ©2016 Elsevier Inc. ° °

## 2015-04-12 NOTE — ED Notes (Addendum)
Translator line utilized to discharge patient. Patient offered bus pass but then had a friend who called patient and advised they would pick the patient and his son up.

## 2015-04-12 NOTE — Progress Notes (Addendum)
LCSW aware of consult for patient and establishing refugee services in [place].  LCSW has completed referral to [employer] [facility] through [facility]. Spoke with "snow" on the phone who is aware of referral that was faxed and emailed and will follow up with LCSW once she has completed working with a current patient.    Patient is sleeping at this time.  Will follow up once Emogene Morgan has called back.  If she does not call back, referral has been sent for patient, will explain program using language line and patient to be discharged home.    LCSW met with patient, patient son, and friend from [facility] program. Learned patient is part of [employer] ([employer]) and this is the program paying for the family home and helping with food stamps. Children are currently in school and established with [facility]. LCSW discussed other options for services through [facility] and patient now with friend who came to pick him up.   LCSW called [employer] to assist patient in receiving adequate housing as he continues to establish life in he [place].  Lane Hacker, MSW Clinical Social Work: Emergency Room [phone]

## 2015-04-12 NOTE — ED Notes (Signed)
Aaron Reyes, CSW advised that all of patient's information was given to "snow" and they advised they will follow up with patient as soon as possible in order to provide him with assistance that he needs.

## 2015-04-13 ENCOUNTER — Encounter (HOSPITAL_COMMUNITY): Payer: Self-pay | Admitting: Interventional Cardiology

## 2015-04-15 ENCOUNTER — Ambulatory Visit: Payer: Medicaid Other | Admitting: Family Medicine

## 2015-04-19 ENCOUNTER — Ambulatory Visit (INDEPENDENT_AMBULATORY_CARE_PROVIDER_SITE_OTHER): Payer: Medicaid Other | Admitting: Family Medicine

## 2015-04-19 ENCOUNTER — Encounter: Payer: Self-pay | Admitting: Family Medicine

## 2015-04-19 VITALS — BP 121/74 | HR 77 | Temp 98.0°F | Ht 65.5 in | Wt 144.5 lb

## 2015-04-19 DIAGNOSIS — F411 Generalized anxiety disorder: Secondary | ICD-10-CM | POA: Diagnosis not present

## 2015-04-19 DIAGNOSIS — Z23 Encounter for immunization: Secondary | ICD-10-CM

## 2015-04-19 DIAGNOSIS — W57XXXA Bitten or stung by nonvenomous insect and other nonvenomous arthropods, initial encounter: Secondary | ICD-10-CM | POA: Insufficient documentation

## 2015-04-19 DIAGNOSIS — T148 Other injury of unspecified body region: Secondary | ICD-10-CM | POA: Diagnosis not present

## 2015-04-19 DIAGNOSIS — M549 Dorsalgia, unspecified: Secondary | ICD-10-CM | POA: Diagnosis present

## 2015-04-19 DIAGNOSIS — Z599 Problem related to housing and economic circumstances, unspecified: Secondary | ICD-10-CM

## 2015-04-19 LAB — POCT URINALYSIS DIPSTICK
Bilirubin, UA: NEGATIVE
Glucose, UA: NEGATIVE
Ketones, UA: NEGATIVE
Leukocytes, UA: NEGATIVE
Nitrite, UA: NEGATIVE
Protein, UA: NEGATIVE
Spec Grav, UA: 1.025
Urobilinogen, UA: 0.2
pH, UA: 5.5

## 2015-04-19 LAB — POCT UA - MICROSCOPIC ONLY

## 2015-04-19 MED ORDER — KETOROLAC TROMETHAMINE 30 MG/ML IJ SOLN
30.0000 mg | Freq: Once | INTRAMUSCULAR | Status: AC
  Administered 2015-04-19: 30 mg via INTRAMUSCULAR

## 2015-04-19 MED ORDER — HYDROXYZINE HCL 25 MG PO TABS: 12.5000 mg | ORAL_TABLET | Freq: Three times a day (TID) | ORAL | Status: DC | PRN

## 2015-04-19 NOTE — Progress Notes (Signed)
Subjective: CC: hospital follow up chest pressure HPI: Patient is a 39 y.o. male presenting to clinic today for office visit. Concerns today include:  Arabic interpretation provided by [employer] video interpreter [name] ID [id number]  1. Chest pressure Patient had heart cath recently that was negative for pathology.  He notes that after discharge from the catheterization he was dizzy at home and fell on his face.  Patient notes a lot of anxiety.  He states that the move from [place] and the current state of his current housing causes him a lot of stress.  He notes bedbugs/insects that are stressing him out.  2. Insect issue Patient notes that he has been in contact with Tiffany/Stephanie regarding moving apartments.  He was denied moving apartments because apparently other [place] refugees will request to be moved and this can not be accommodated at this time.  Patient notes severe distress regarding living conditions and sites that the organization that houses them should not be held accountable, the [place] government should be held accountable.  3. R sided back/flank pain Patient notes that back pain started the day following the catheterization.  Pain starts in lumbar region and radiates to R leg.  He also notes a rash on this region.  Pain is described as intermittent and seems to migrate.  He compares it to a kidney stone.  Patient was checked for kidney stones in [place].  He endorses a h/o kidney stone.  He was given a medication in [place] for this and was able to pass stone.  Occ nausea.  He denies fevers, chills, or vomiting.     Social History Reviewed. FamHx and MedHx updated.  Please see EMR. Health Maintenance: Flu shot today.  ROS: Per HPI  Objective: Office vital signs reviewed. BP 121/74 mmHg  Pulse 77  Temp(Src) 98 F (36.7 C) (Oral)  Ht 5' 5.5" (1.664 m)  Wt 144 lb 8 oz (65.545 kg)  BMI 23.67 kg/m2  Physical Examination:  General: Awake, alert, well nourished,  NAD HEENT: Normal Neck: several excoriated, healing bug bites on the lateral aspects of neck Cardio: RRR, S1S2 heard, no murmurs appreciated Pulm: CTAB, no wheezes, rhonchi or rales, normal WOB Extremities: WWP, No edema, +2 posterior tibial pulses MSK: Normal gait and station, Mild paraspinal TTP to T11-L1.  No CVA TTP. Skin: dry, several excoriated, healing bug bites on the lateral aspects of neck   Results for orders placed or performed in visit on 04/19/15 (from the past 24 hour(s))  Urinalysis Dipstick     Status: Abnormal   Collection Time: 04/19/15  4:45 PM  Result Value Ref Range   Color, UA YELLOW    Clarity, UA CLEAR    Glucose, UA NEG    Bilirubin, UA NEG    Ketones, UA NEG    Spec Grav, UA 1.025    Blood, UA SMALL    pH, UA 5.5    Protein, UA NEG    Urobilinogen, UA 0.2    Nitrite, UA NEG    Leukocytes, UA Negative Negative   Narrative   Reflex to microscopic   POCT UA - Microscopic Only     Status: None   Collection Time: 04/19/15  4:45 PM  Result Value Ref Range   WBC, Ur, HPF, POC NONE    RBC, urine, microscopic RARE    Bacteria, U Microscopic RARE    Epithelial cells, urine per micros RARE    Assessment/ Plan: 39 y.o. male with  1. Back  pain, T11-L1 region. R>L.  Based on history, this could very likely be a renal stone.  UA with trace RBCs.  Patient was seen after hours and lab staff had left for the evening, so no urine culture was obtained.  Patient has been without systemic symptoms, including fevers, so think that this is ok to follow up closely on for now. - Urinalysis Dipstick - POCT UA - Microscopic Only - ketorolac (TORADOL) 30 MG/ML injection 30 mg; Inject 1 mL (30 mg total) into the muscle once. - Return precautions reviewed, patient voiced good understanding. - Patient to follow up in 1 week for repeat UA (w/ culture if continued RBCs present).  Would consider imaging for renal stones at that point if continues to be symptomatic. - In addition,  could consider Flomax to help pass stone.  Would benefit from a urine sieve/strainer as well if appropriate.   2. Housing problems.  Patient very overwhelmed by the conditions of his current housing situation, siting random knocks on his front door at night and an apparent insect problem. - Will cc [facility] physician to see if we can help with this issue  3. Insect bite.  Noted on neck and Lower extremities. - hydrOXYzine (ATARAX/VISTARIL) 25 MG tablet; Take 0.5-1 tablets (12.5-25 mg total) by mouth 3 (three) times daily as needed.  Dispense: 30 tablet; Refill: 0  4. Anxiety state.  Again in relation to above housing difficulties, and as appropriate, new relocation to the [place]. - hydrOXYzine (ATARAX/VISTARIL) 25 MG tablet; Take 0.5-1 tablets (12.5-25 mg total) by mouth 3 (three) times daily as needed.  Dispense: 30 tablet; Refill: 0 - Will continue to provide support as able - Patient has information for CSW given to him in ED, but language barrier is preventing him from utilizing this resource.   Raliegh Ip, DO PGY-2, [facility]

## 2015-04-19 NOTE — Patient Instructions (Signed)
I have prescribed a medication for anxiety and itching.  You can take 1/2 to 1 tablet up to 3 times daily.  This medication may make you sleepy.  Do not operate heavy machinery while taking it.  If you develop fevers, chills, worsening pain, blood in your urine, please seek immediate medical attention.  Kidney Stones Kidney stones (urolithiasis) are solid masses that form inside your kidneys. The intense pain is caused by the stone moving through the kidney, ureter, bladder, and urethra (urinary tract). When the stone moves, the ureter starts to spasm around the stone. The stone is usually passed in your pee (urine).  HOME CARE  Drink enough fluids to keep your pee clear or pale yellow. This helps to get the stone out.  Take a 24-hour pee (urine) sample as told by your doctor. You may need to take another sample every 6-12 months.  Strain all pee through the provided strainer. Do not pee without peeing through the strainer, not even once. If you pee the stone out, catch it in the strainer. The stone may be as small as a grain of salt. Take this to your doctor. This will help your doctor figure out what you can do to try to prevent more kidney stones.  Only take medicine as told by your doctor.  Make changes to your daily diet as told by your doctor. You may be told to:  Limit how much salt you eat.  Eat 5 or more servings of fruits and vegetables each day.  Limit how much meat, poultry, fish, and eggs you eat.  Keep all follow-up visits as told by your doctor. This is important.  Get follow-up X-rays as told by your doctor. GET HELP IF: You have pain that gets worse even if you have been taking pain medicine. GET HELP RIGHT AWAY IF:   Your pain does not get better with medicine.  You have a fever or shaking chills.  Your pain increases and gets worse over 18 hours.  You have new belly (abdominal) pain.  You feel faint or pass out.  You are unable to pee.   This information is  not intended to replace advice given to you by your health care provider. Make sure you discuss any questions you have with your health care provider.   Document Released: 11/21/2007 Document Revised: 02/23/2015 Document Reviewed: 11/05/2012 Elsevier Interactive Patient Education [employer]2016 Elsevier Inc.

## 2015-04-21 DIAGNOSIS — Z23 Encounter for immunization: Secondary | ICD-10-CM

## 2015-04-22 ENCOUNTER — Encounter: Payer: Self-pay | Admitting: *Deleted

## 2015-04-22 ENCOUNTER — Encounter: Payer: Self-pay | Admitting: Family Medicine

## 2015-04-22 NOTE — Progress Notes (Signed)
Patient ID: Aaron Reyes, male   DOB: 12/18/1975, 39 y.o.   MRN: [id number] Discussed case with Fredrich RomansLauren Millis, CSW, covering for Normal Wilson.  She will contact ED CSW that saw patient and do some additional investigating as to how we can help this patient with his current housing situation.  Will hopefully have some information for patient at his follow up on 04/26/15.  Would consider referral back to [facility] with Dr Gwendolyn GrantWalden for this.  Ashly M. Nadine CountsGottschalk, DO PGY-2, [facility]
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
## 2015-04-26 ENCOUNTER — Ambulatory Visit (INDEPENDENT_AMBULATORY_CARE_PROVIDER_SITE_OTHER): Payer: Medicaid Other | Admitting: Internal Medicine

## 2015-04-26 ENCOUNTER — Encounter: Payer: Self-pay | Admitting: Internal Medicine

## 2015-04-26 VITALS — BP 120/86 | HR 87 | Temp 98.8°F | Ht 65.5 in | Wt 145.0 lb

## 2015-04-26 DIAGNOSIS — R109 Unspecified abdominal pain: Secondary | ICD-10-CM

## 2015-04-26 DIAGNOSIS — H9313 Tinnitus, bilateral: Secondary | ICD-10-CM | POA: Insufficient documentation

## 2015-04-26 DIAGNOSIS — R319 Hematuria, unspecified: Secondary | ICD-10-CM

## 2015-04-26 DIAGNOSIS — F4322 Adjustment disorder with anxiety: Secondary | ICD-10-CM | POA: Insufficient documentation

## 2015-04-26 DIAGNOSIS — M549 Dorsalgia, unspecified: Secondary | ICD-10-CM | POA: Insufficient documentation

## 2015-04-26 DIAGNOSIS — R300 Dysuria: Secondary | ICD-10-CM | POA: Insufficient documentation

## 2015-04-26 DIAGNOSIS — M545 Low back pain, unspecified: Secondary | ICD-10-CM

## 2015-04-26 DIAGNOSIS — R0789 Other chest pain: Secondary | ICD-10-CM | POA: Diagnosis not present

## 2015-04-26 LAB — POCT UA - MICROSCOPIC ONLY

## 2015-04-26 LAB — POCT URINALYSIS DIPSTICK
BILIRUBIN UA: NEGATIVE
GLUCOSE UA: NEGATIVE
Ketones, UA: NEGATIVE
Leukocytes, UA: NEGATIVE
NITRITE UA: NEGATIVE
Protein, UA: NEGATIVE
Urobilinogen, UA: 0.2
pH, UA: 5.5
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
## 2015-04-26 NOTE — Assessment & Plan Note (Signed)
Worsening. UA reveals no hematuria, no WBCs, and only few bacteria, making UTI or pyelonephritis unlikely. Could potentially be related to nephrolithiasis.  - Renal CT

## 2015-04-26 NOTE — Assessment & Plan Note (Signed)
Worsening. Patient currently has multiple stressors that he reports directly correlate with his anxiety level. Reported "crises" always occur after patient has been upset or frustrated by someone. Given identifiable inciting event prior to "crises," patient's symptoms do not meet criteria for panic disorder. Given multiple identifiable stressors for anxious mood, patient does not meet criteria for generalized anxiety. As such, will not start medications at this time.  - [facility]erral to [facility] for counseling - If decision is made to start medications in the future, SNRI such as Cymbalta may be good choice as this can also treat chronic pain

## 2015-04-26 NOTE — Assessment & Plan Note (Signed)
Stable. Patient has recently had negative cardiac work-up, including negative troponins, and no abnormalities seen on cardiac cath. He reports pain is worst when he is anxious, so anxiety seems more likely cause of pain than cardiac etiology.  - Attend already scheduled appt with cardiology - Likely no need for further workup

## 2015-04-26 NOTE — Assessment & Plan Note (Signed)
Stable, chronic. Given colicky nature of pain and reported severity, could be due to nephrolithiasis. However, lack of hematuria and length of pain (7 months duration) makes this less likely. Other causes could be sciatica or MSK etiology such as muscle strain.  - CT renal per protocol to rule out nephrolithiasis (greater sensitivity than US)

## 2015-04-28 ENCOUNTER — Other Ambulatory Visit: Payer: Self-pay | Admitting: *Deleted

## 2015-04-28 ENCOUNTER — Encounter: Payer: Self-pay | Admitting: Cardiology

## 2015-04-28 ENCOUNTER — Ambulatory Visit (INDEPENDENT_AMBULATORY_CARE_PROVIDER_SITE_OTHER): Payer: Medicaid Other | Admitting: Cardiology

## 2015-04-28 VITALS — BP 106/66 | HR 73 | Ht 65.0 in | Wt 144.2 lb

## 2015-04-28 DIAGNOSIS — R0789 Other chest pain: Secondary | ICD-10-CM

## 2015-04-28 DIAGNOSIS — K2100 Gastro-esophageal reflux disease with esophagitis, without bleeding: Secondary | ICD-10-CM

## 2015-04-28 DIAGNOSIS — Z9889 Other specified postprocedural states: Secondary | ICD-10-CM

## 2015-04-28 DIAGNOSIS — I2699 Other pulmonary embolism without acute cor pulmonale: Secondary | ICD-10-CM | POA: Diagnosis not present

## 2015-04-28 MED ORDER — PANTOPRAZOLE SODIUM 40 MG PO TBEC: 40.0000 mg | DELAYED_RELEASE_TABLET | Freq: Every day | ORAL | Status: DC

## 2015-04-28 MED ORDER — ASPIRIN EC 81 MG PO TBEC
81.0000 mg | DELAYED_RELEASE_TABLET | Freq: Every day | ORAL | Status: DC
Start: 2015-04-28 — End: 2019-09-24

## 2015-04-28 NOTE — Patient Instructions (Signed)
Medication Instructions:  Your physician has recommended you make the following change in your medication:  1. Start Protonix ( 40 mg ) daily 2. Decrease Aspirin ( 81 mg ) daily   Labwork: -None  Testing/Procedures: Non-Cardiac CT Angiography (CTA), is a special type of CT scan that uses a computer to produce multi-dimensional views of major blood vessels throughout the body. In CT angiography, a contrast material is injected through an IV to help visualize the blood vessels   Follow-Up: Your physician recommends that you keep your scheduled follow-up appointment with Dr. Mayford Knifeurner/ pt needs interperter  [facility]ARDIAC CHEST PAIN/GERD    Any Other Special Instructions Will Be Listed Below (If Applicable).     If you need a refill on your cardiac medications before your next appointment, please call your pharmacy.

## 2015-04-28 NOTE — Progress Notes (Signed)
Cardiology Office Note   Date:  04/28/2015   ID:  Aaron Reyes, DOB Jun 15, 1976, MRN [id number]  PCP:  Aaron FlavinAshly Gottschalk, DO  Cardiologist:  Dr. Mayford Knifeurner    Chief Complaint  Patient presents with  . Hospitalization Follow-up    still with chest pain.      History of Present Illness: Aaron Reyes is a 39 y.o. male who presents for follow up hospitalization of cath.  Normal coronary arteries and normal LV function.  He continues with chest pain.  Was seen in ER after the cath and initial troponin was 0.0.9  And follow up was 0.00.  His pain is most obvious climbing a ladder.  NTG does relieve.  His ddimer in ER was negative.   We discussed with interpreter and pt. Possible GERD or esophagitis, muscular skeletal or stress.  Discussed with Dr. Mayford Knifeurner.      Past Medical History  Diagnosis Date  . Kidney stone   . S/P cardiac catheterization 03/2015    cath in [place] and [name], no records,  [facility] coronary arteries and normal LV function.    Past Surgical History  Procedure Laterality Date  . Cardiac catheterization    . Hernia repair    . Cardiac catheterization      x 2  . Cardiac catheterization N/A 04/11/2015    Procedure: Left Heart Cath and Coronary Angiography;  Surgeon: Lyn RecordsHenry W Smith, MD;  [facility];  Service: Cardiovascular;  Laterality: N/A;     Current Outpatient Prescriptions  Medication Sig Dispense Refill  . nitroGLYCERIN (NITROSTAT) 0.4 MG SL tablet Place 1 tablet (0.4 mg total) under the tongue every 5 (five) minutes as needed for chest pain. 20 tablet 3  . aspirin EC 81 MG tablet Take 1 tablet (81 mg total) by mouth daily. 30 tablet 3  . pantoprazole (PROTONIX) 40 MG tablet Take 1 tablet (40 mg total) by mouth daily. 30 tablet 11   No current facility-administered medications for this visit.    Allergies:   Review of patient's allergies indicates no known allergies.    Social History:  The patient  reports that he  has been smoking Cigarettes.  He has been smoking about 0.25 packs per day. He has never used smokeless tobacco. He reports that he does not drink alcohol or use illicit drugs.   Family History:  The patient's family history includes CAD in his father; Cancer in his mother; Diabetes in his mother; Heart attack in his father; Hypertension in his father and mother; Stroke in his father.    ROS:  General:no colds or fevers, no weight changes Skin:no rashes or ulcers HEENT:no blurred vision, no congestion CV:see HPI PUL:see HPI GI:no diarrhea constipation or melena, no indigestion GU:no hematuria, no dysuria MS:no joint pain, no claudication Neuro:no syncope, no lightheadedness Endo:no diabetes, no thyroid disease   Wt Readings from Last 3 Encounters:  04/28/15 144 lb 3.2 oz (65.409 kg)  04/26/15 145 lb (65.772 kg)  04/19/15 144 lb 8 oz (65.545 kg)     PHYSICAL EXAM: VS:  BP 106/66 mmHg  Pulse 73  Ht 5\' 5"  (1.651 m)  Wt 144 lb 3.2 oz (65.409 kg)  BMI 24.00 kg/m2  SpO2 98% , BMI Body mass index is 24 kg/(m^2). General:Pleasant affect, NAD Skin:Warm and dry, brisk capillary refill HEENT:normocephalic, sclera clear, mucus membranes moist Neck:supple, no JVD, no bruits  Heart:S1S2 RRR without murmur, gallup, rub or click Lungs:clear without rales, rhonchi,  or wheezes ZOX:WRUE, non tender, + BS, do not palpate liver spleen or masses Ext:no lower ext edema, 2+ pedal pulses, 2+ radial pulses Neuro:alert and oriented, MAE, follows commands, + facial symmetry    EKG:  EKG is  NOT ordered today.    Recent Labs: 04/11/2015: BUN 16; Creatinine, Ser 1.24; Hemoglobin 16.0; Platelets 221; Potassium 3.5; Sodium 134*    Lipid Panel    Component Value Date/Time   CHOL 188 02/15/2015 0510   TRIG 116 02/15/2015 0510   HDL 36* 02/15/2015 0510   CHOLHDL 5.2 02/15/2015 0510   VLDL 23 02/15/2015 0510   LDLCALC 129* 02/15/2015 0510       Other studies Reviewed: Additional studies/  records that were reviewed today include: cardiac cath.  ER notes FP notes.  ASSESSMENT AND PLAN:  1.  Non cardiac chest pain.  Normal coronary arteries and normal LV function.  Stop or at least decrease ASA to 81 mg daily, protonix 40 mg po daily, CTA of chest rule out PE.  GI consult.    I will have him follow up in 3 months with Dr. Mayford Knife.  Will call results of stress test. Pt agreeable.      Current medicines are reviewed with the patient today.  The patient Has no concerns regarding medicines.  The following changes have been made:  See above Labs/ tests ordered today include:see above  Disposition:   FU:  see above  Signed, Leone Brand, NP  04/28/2015 4:41 PM    [facility] [address] [place], [place], [place]  [address]/ [address] [place], [place] Phone: [phone]; Fax: [phone]  [phone]

## 2015-04-29 ENCOUNTER — Telehealth (HOSPITAL_COMMUNITY): Payer: Self-pay

## 2015-04-29 ENCOUNTER — Ambulatory Visit (HOSPITAL_COMMUNITY)
Admission: RE | Admit: 2015-04-29 | Discharge: 2015-04-29 | Disposition: A | Discharge status: Home / Self Care | Payer: Medicaid Other | Source: Ambulatory Visit | Attending: Cardiology | Admitting: Cardiology

## 2015-04-29 ENCOUNTER — Encounter: Payer: Self-pay | Admitting: *Deleted

## 2015-04-29 ENCOUNTER — Other Ambulatory Visit (INDEPENDENT_AMBULATORY_CARE_PROVIDER_SITE_OTHER): Payer: Medicaid Other | Admitting: *Deleted

## 2015-04-29 DIAGNOSIS — K2100 Gastro-esophageal reflux disease with esophagitis, without bleeding: Secondary | ICD-10-CM

## 2015-04-29 DIAGNOSIS — R079 Chest pain, unspecified: Secondary | ICD-10-CM

## 2015-04-29 DIAGNOSIS — R0789 Other chest pain: Secondary | ICD-10-CM

## 2015-04-29 DIAGNOSIS — I2699 Other pulmonary embolism without acute cor pulmonale: Secondary | ICD-10-CM

## 2015-04-29 LAB — D-DIMER, QUANTITATIVE: D-Dimer, Quant: 0.34 ug/mL-FEU (ref 0.00–0.48)

## 2015-04-29 NOTE — Progress Notes (Signed)
[facility] Clinical Social Work Loss adjuster, charteredCovering for [employer] Wilson  Clinical Social Work followed up on Dr Gabriel CirriAbigail Lancaster's request for referral for additional mental health evaluation. CSW researched options for further mental health evaluation that can provide Arabic interpreter and both [facility] and [address] are options. CSW spoke with pt's case Production designer, theatre/television/filmmanager at [employer], Richard MiuStephanie Adams [phone]) and she will coordinate this follow up as she is able to communicate with pt and his family directly, in person. She is hopeful pt will agree to go and is concerned that pt's anxiety is not currently addressed. This CSW signing off as pt's case manager will coordinate follow up. Pt could need assistance with medications based on further follow up as well.   Clinical Social Work interventions: Adult nurset advocacy  Resource coordination  Doreen SalvageGrier Collyns Mcquigg, LCSW Clinical Social Worker [facility]  [facility] Phone: [phone] Fax: [phone]  Covering for Theresia BoughNorma Wilson

## 2015-04-29 NOTE — Addendum Note (Signed)
Addended by: Tonita PhoenixBOWDEN, ROBIN K on: 04/29/2015 02:50 PM   Modules accepted: Orders

## 2015-04-29 NOTE — Telephone Encounter (Signed)
Billing st that per Dr. Mayford Knifeurner, patient can have STAT ddimer. If Ddimer is normal, then CTA can wait until precert goes through before having scan done. Patient is not reachable at this time to get him to come to the office for blood work. Called and notified [facility] department. If patient goes to hospital first, they will send him to the office for STAT labs.

## 2015-04-29 NOTE — Telephone Encounter (Signed)
Spoke with patient and friend, Heron NayKhalil, in office.  Explained to patient results of ddimer will have to be resulted before his CTA is precerted. Informed patient if his ddimer is positive, he will be sent to the ED later. Patient and friend agree with treatment plan.

## 2015-05-02 ENCOUNTER — Encounter: Payer: Self-pay | Admitting: Gastroenterology

## 2015-05-10 DIAGNOSIS — N200 Calculus of kidney: Secondary | ICD-10-CM

## 2015-05-10 DIAGNOSIS — I519 Heart disease, unspecified: Secondary | ICD-10-CM

## 2015-05-10 NOTE — Congregational Nurse Program (Signed)
Congregational Nurse Program Note  Date of Encounter: 05/10/2015  Past Medical History: Past Medical History  Diagnosis Date  . Kidney stone   . S/P cardiac catheterization 03/2015    cath in [place] and [name], no records,  [facility] normal coronary arteries and normal LV function.    Encounter Details:      Amb Nursing Assessment - 04/26/15 1343    Language Assistant   Interpreter Name claudine   Interpreter ID [id number]      Initial office visit with client at [facility] nurse office today to introduce services available. Arabic interpreter used. Referred by [facility] intake staff and center director. Client admits to feeling frustrated by lack of understanding of health issues. Scheduled to return to [facility] nurse office 11/29 for further screening and support from staff.

## 2015-05-17 NOTE — Congregational Nurse Program (Signed)
On 05/05/2015 - 1000 - Aaron FavorsInterviewed Aaron Reyes at [facility] to gather data for basic health information and to discuss medical or mental health concerns.  Regarding mental health concerns client stated through Arabic interpretation, "I feel sad and angry."  When questioned further client revealed he is sad and angry due to the transition he is experiencing from [name] the [place]  [place] the [place].  Client does feel safe here in the [place] but feels sad as well.  Through Arabic interpretation client reassured that sadness and anger are part of the grieving process that human beings experience with great loss and change as he and his family are going through now.  Client encouraged to focus on self-care for himself and his family.  Client receptive to plan verbalized appreciation for Anjana Cheek, RN's  caring and concern.  Client kissed Sport and exercise psychologistBeth Shlomie Romig, RN on the forehead and interpreter said this was a sign of respect.  Eula Jaster, RN thanked him for his expression of respect.  Client was without symptoms of psychosis.  Client denies suicidal ideation or homocidal ideation.  Client is oriented times 3.  Client denies visual or auditory hallucination or delusion.  Speech is clear. Positive for eye contact. Affect congruent with mood.  At times client did act anxious especially when discussing his children and their welfare. Client instructed to follow-up with Ferol LuzMarietta Douglas, RN at [facility] on 05/10/15 of which he did follow-up with her. Her notes are in his chart.  Also, Alphonse GuildBeth Dellas Guard, RN notified Maren ReamerMaureen Flak, RN to update her on the present mental assessment of client as she has been following [place] family through her work ar [employer].

## 2015-05-27 DIAGNOSIS — Z09 Encounter for follow-up examination after completed treatment for conditions other than malignant neoplasm: Secondary | ICD-10-CM

## 2015-05-27 NOTE — Congregational Nurse Program (Signed)
Due to information coming to me from multiple sources, I sent an email to Dr Mariane DuvalJ Walden, MD at [facility]. We are asking for assistance to determine if we are seeing S/S of a mental health situation. I have given Dr Gwendolyn GrantWalden my contact information to be able to give him any and all information collected on this patient. Maren ReamerMaureen Zierra Laroque RN

## 2015-05-27 NOTE — Congregational Nurse Program (Signed)
Congregational Nurse Program Note  Date of Encounter: 05/27/2015  Past Medical History: Past Medical History  Diagnosis Date  . Kidney stone   . S/P cardiac catheterization 03/2015    cath [name], [place] records,  [facility] coronary arteries and normal LV function.    Encounter Details:     CNP Questionnaire - 05/27/15 1018    Patient Demographics   Is this a new or existing patient? Existing  [facility]   Patient is considered a/an Refugee   Patient Assistance   Patient's financial/insurance status Medicaid   Patient referred to apply for the following financial assistance Not Applicable   Food insecurities addressed Not Applicable   Transportation assistance No   Assistance securing medications No   Educational health offerings Not Applicable   Encounter Details   Primary purpose of visit Acute Illness/Condition Visit   Was an Emergency Department visit averted? Not Applicable   Does patient have a medical provider? Yes   Patient referred to Follow up with established PCP   Was a mental health screening completed? (GAINS tool) No   Does patient have dental issues? No   Since previous encounter, have you referred patient for abnormal blood pressure that resulted in a new diagnosis or medication change? No   Since previous encounter, have you referred patient for abnormal blood glucose that resulted in a new diagnosis or medication change? No   For Abstraction Use Only   Does patient have insurance? Yes     Due to new information coming to light, an email was sent to Dr Shela CommonsJ. Gwendolyn GrantWalden at [facility] requesting assistance for possible mental health needs with this patient. Maren ReamerMaureen Chinara Hertzberg RN

## 2015-06-19 DIAGNOSIS — F4310 Post-traumatic stress disorder, unspecified: Secondary | ICD-10-CM

## 2015-06-19 HISTORY — DX: Post-traumatic stress disorder, unspecified: F43.10

## 2015-07-04 ENCOUNTER — Ambulatory Visit: Payer: Medicaid Other | Admitting: Gastroenterology

## 2015-08-15 NOTE — Progress Notes (Signed)
This encounter was created in error - please disregard.

## 2015-08-16 ENCOUNTER — Encounter: Payer: Medicaid Other | Admitting: Cardiology

## 2015-08-18 ENCOUNTER — Encounter: Payer: Self-pay | Admitting: Cardiology

## 2015-08-26 ENCOUNTER — Ambulatory Visit: Payer: Medicaid Other | Admitting: Gastroenterology

## 2015-10-10 ENCOUNTER — Encounter: Payer: Self-pay | Admitting: Internal Medicine

## 2015-10-10 ENCOUNTER — Ambulatory Visit (INDEPENDENT_AMBULATORY_CARE_PROVIDER_SITE_OTHER): Payer: Medicaid Other | Admitting: Internal Medicine

## 2015-10-10 DIAGNOSIS — K089 Disorder of teeth and supporting structures, unspecified: Secondary | ICD-10-CM

## 2015-10-10 DIAGNOSIS — H9313 Tinnitus, bilateral: Secondary | ICD-10-CM | POA: Diagnosis present

## 2015-10-10 DIAGNOSIS — F4322 Adjustment disorder with anxiety: Secondary | ICD-10-CM | POA: Diagnosis not present

## 2015-10-10 DIAGNOSIS — Z23 Encounter for immunization: Secondary | ICD-10-CM | POA: Diagnosis not present

## 2015-10-10 MED ORDER — HEPATITIS A VACCINE 1440 EL U/ML IM SUSP: 1.0000 mL | Freq: Once | INTRAMUSCULAR | Status: DC

## 2015-10-10 NOTE — Assessment & Plan Note (Signed)
Persistent. Reports was not contacted by audiology office after last appointment.  - Place repeat audiology referral - Specified for patient to be contacted with date and time of appointment via interpretor services to ensure understanding

## 2015-10-10 NOTE — Patient Instructions (Addendum)
I have placed a referral to an audiologist for your ear symptoms, and also to a dentist.   You will be called when your additional vaccine has arrived.   Please schedule an appointment at [facility] to help your anxiety. The information is: [address] [place], [place] [address] Phone: [phone]

## 2015-10-10 NOTE — Progress Notes (Signed)
   Subjective:    Patient ID: Aaron Reyes, male    DOB: [birth year], 40 y.o.   MRN: [id number]  HPI  Anxiety  Patient reports continued anxiety. This has been going on for many months, and was discussed at [facility]. He was recommended to be seen at [facility] for counseling for acute adjustment disorder, however did not schedule an appointment there. He reports that recently he has been more anxious after his son was bitten by a dog two months ago. He is very concerned in general about the health of his children. He also reports that he has episodes of panic, and he does nto know how to control his symptoms. He is hesitant to begin mediation at this time and thinks counseling will be helpful for him. Denies SI/HI.   Tinnitus  Patient reports continued ringing in both ears. He was referred to audiologist at last appointment, but says he was never contacted with the date of the appointment. Symptoms have neither improved nor worsened.   Patient is current every day smoker. He immigrated from [place] with his wife and children less than a year ago. Patient is also in need of Hep A, Hep B, and polio vaccines today.   Review of Systems See HPI.     Objective:   Physical Exam  Constitutional: He is oriented to person, place, and time. He appears well-developed and well-nourished.  HENT:  Head: Normocephalic and atraumatic.  Cardiovascular: Normal rate.   Pulmonary/Chest: Effort normal.  Neurological: He is alert and oriented to person, place, and time.  Psychiatric:  Intermittently agitated. Labile mood.       Assessment & Plan:  Adjustment disorder with anxious mood Was [facility] as recommended at last appointment. Is now anxious, specifically regarding the health of his children, after his son was bitten by a dog two months ago. Given acute nature of symptoms, counseling is more likely to be helpful than medications at this time. Patient is agreeable to attending  counseling. No SI/HI, currently not a threat to himself or others.  - Provided information for [facility]. Instructed patient to walk in as he only speaks Arabic and would be unable to make appointment by phone.  - If medication indicated in the future, can consider SNRI  Tinnitus of both ears Persistent. Reports was not contacted by audiology office after last appointment.  - Place repeat audiology referral - Specified for patient to be contacted with date and time of appointment via interpretor services to ensure understanding   Tarri AbernethyAbigail J Chrisopher Pustejovsky, MD PGY-1 [facility] Pager [phone]

## 2015-10-10 NOTE — Addendum Note (Signed)
Addended by: Gilberto BetterSIMPSON, Hart Haas R on: 10/10/2015 05:40 PM   Modules accepted: Orders, SmartSet

## 2015-10-10 NOTE — Assessment & Plan Note (Signed)
Was not seen at [facility] as recommended at last appointment. Is now anxious, specifically regarding the health of his children, after his son was bitten by a dog two months ago. Given acute nature of symptoms, counseling is more likely to be helpful than medications at this time. Patient is agreeable to attending counseling. No SI/HI, currently not a threat to himself or others.  - Provided information for [facility]. Instructed patient to walk in as he only speaks Arabic and would be unable to make appointment by phone.  - If medication indicated in the future, can consider SNRI

## 2015-10-14 ENCOUNTER — Telehealth: Payer: Self-pay

## 2015-10-14 NOTE — Telephone Encounter (Signed)
Called patient using [employer] Ahmed [phone], informed patient of his appointment at the health department on November 21, 2015 at 1pm.  He understood and thatnked me but wanted me to text him the address to the health department.  I informed him that I could not do that and he insisted that he still needed a text.  I informed Shanda BumpsJessica of the request and she instructed me to mail him and not text.Glennie HawkSimpson, Eugune Sine R

## 2015-10-14 NOTE — Addendum Note (Signed)
Addended by: Jone BasemanFLEEGER, Zae Kirtz D on: 10/14/2015 09:59 AM   Modules accepted: Orders

## 2015-11-15 ENCOUNTER — Ambulatory Visit: Payer: Medicaid Other | Admitting: Family Medicine

## 2015-11-20 ENCOUNTER — Encounter (HOSPITAL_COMMUNITY): Payer: Self-pay | Admitting: Emergency Medicine

## 2015-11-20 ENCOUNTER — Emergency Department (HOSPITAL_COMMUNITY): Payer: Medicaid Other

## 2015-11-20 ENCOUNTER — Emergency Department (HOSPITAL_COMMUNITY)
Admission: EM | Admit: 2015-11-20 | Discharge: 2015-11-20 | Disposition: A | Discharge status: Home / Self Care | Payer: Medicaid Other | Attending: Emergency Medicine | Admitting: Emergency Medicine

## 2015-11-20 ENCOUNTER — Other Ambulatory Visit: Payer: Self-pay

## 2015-11-20 DIAGNOSIS — R51 Headache: Secondary | ICD-10-CM | POA: Insufficient documentation

## 2015-11-20 DIAGNOSIS — K2100 Gastro-esophageal reflux disease with esophagitis, without bleeding: Secondary | ICD-10-CM

## 2015-11-20 DIAGNOSIS — F17210 Nicotine dependence, cigarettes, uncomplicated: Secondary | ICD-10-CM | POA: Insufficient documentation

## 2015-11-20 DIAGNOSIS — R079 Chest pain, unspecified: Secondary | ICD-10-CM | POA: Diagnosis present

## 2015-11-20 DIAGNOSIS — R0789 Other chest pain: Secondary | ICD-10-CM

## 2015-11-20 DIAGNOSIS — Z7982 Long term (current) use of aspirin: Secondary | ICD-10-CM | POA: Insufficient documentation

## 2015-11-20 DIAGNOSIS — K219 Gastro-esophageal reflux disease without esophagitis: Secondary | ICD-10-CM | POA: Insufficient documentation

## 2015-11-20 DIAGNOSIS — Z87442 Personal history of urinary calculi: Secondary | ICD-10-CM | POA: Insufficient documentation

## 2015-11-20 DIAGNOSIS — I2699 Other pulmonary embolism without acute cor pulmonale: Secondary | ICD-10-CM

## 2015-11-20 DIAGNOSIS — Z79899 Other long term (current) drug therapy: Secondary | ICD-10-CM | POA: Diagnosis not present

## 2015-11-20 DIAGNOSIS — Z9889 Other specified postprocedural states: Secondary | ICD-10-CM | POA: Diagnosis not present

## 2015-11-20 LAB — HEPATIC FUNCTION PANEL
ALT: 40 U/L (ref 17–63)
AST: 33 U/L (ref 15–41)
Albumin: 3.7 g/dL (ref 3.5–5.0)
Alkaline Phosphatase: 66 U/L (ref 38–126)
BILIRUBIN DIRECT: 0.2 mg/dL (ref 0.1–0.5)
BILIRUBIN TOTAL: 0.6 mg/dL (ref 0.3–1.2)
Indirect Bilirubin: 0.4 mg/dL (ref 0.3–0.9)
Total Protein: 7 g/dL (ref 6.5–8.1)

## 2015-11-20 LAB — CBC
HCT: 43.1 % (ref 39.0–52.0)
HEMOGLOBIN: 15 g/dL (ref 13.0–17.0)
MCH: 29 pg (ref 26.0–34.0)
MCHC: 34.8 g/dL (ref 30.0–36.0)
MCV: 83.4 fL (ref 78.0–100.0)
Platelets: 241 10*3/uL (ref 150–400)
RBC: 5.17 MIL/uL (ref 4.22–5.81)
RDW: 12.5 % (ref 11.5–15.5)
WBC: 10 10*3/uL (ref 4.0–10.5)

## 2015-11-20 LAB — I-STAT TROPONIN, ED: TROPONIN I, POC: 0.02 ng/mL (ref 0.00–0.08)

## 2015-11-20 LAB — BASIC METABOLIC PANEL
ANION GAP: 11 (ref 5–15)
BUN: 17 mg/dL (ref 6–20)
CALCIUM: 9.4 mg/dL (ref 8.9–10.3)
CO2: 20 mmol/L — AB (ref 22–32)
CREATININE: 0.69 mg/dL (ref 0.61–1.24)
Chloride: 106 mmol/L (ref 101–111)
GFR calc Af Amer: >60 mL/min (ref >60)
GFR calc non Af Amer: >60 mL/min (ref >60)
GLUCOSE: 155 mg/dL — AB (ref 65–99)
Potassium: 3.7 mmol/L (ref 3.5–5.1)
Sodium: 137 mmol/L (ref 135–145)

## 2015-11-20 IMAGING — DX DG CHEST PORT 1 VIEW
1 series · 1 of 1 positions shown · non-contrast
Comparison: [DATE]

CLINICAL DATA: Chest pain and shortness of breath

EXAM:
PORTABLE CHEST 1 VIEW

[chest ap]
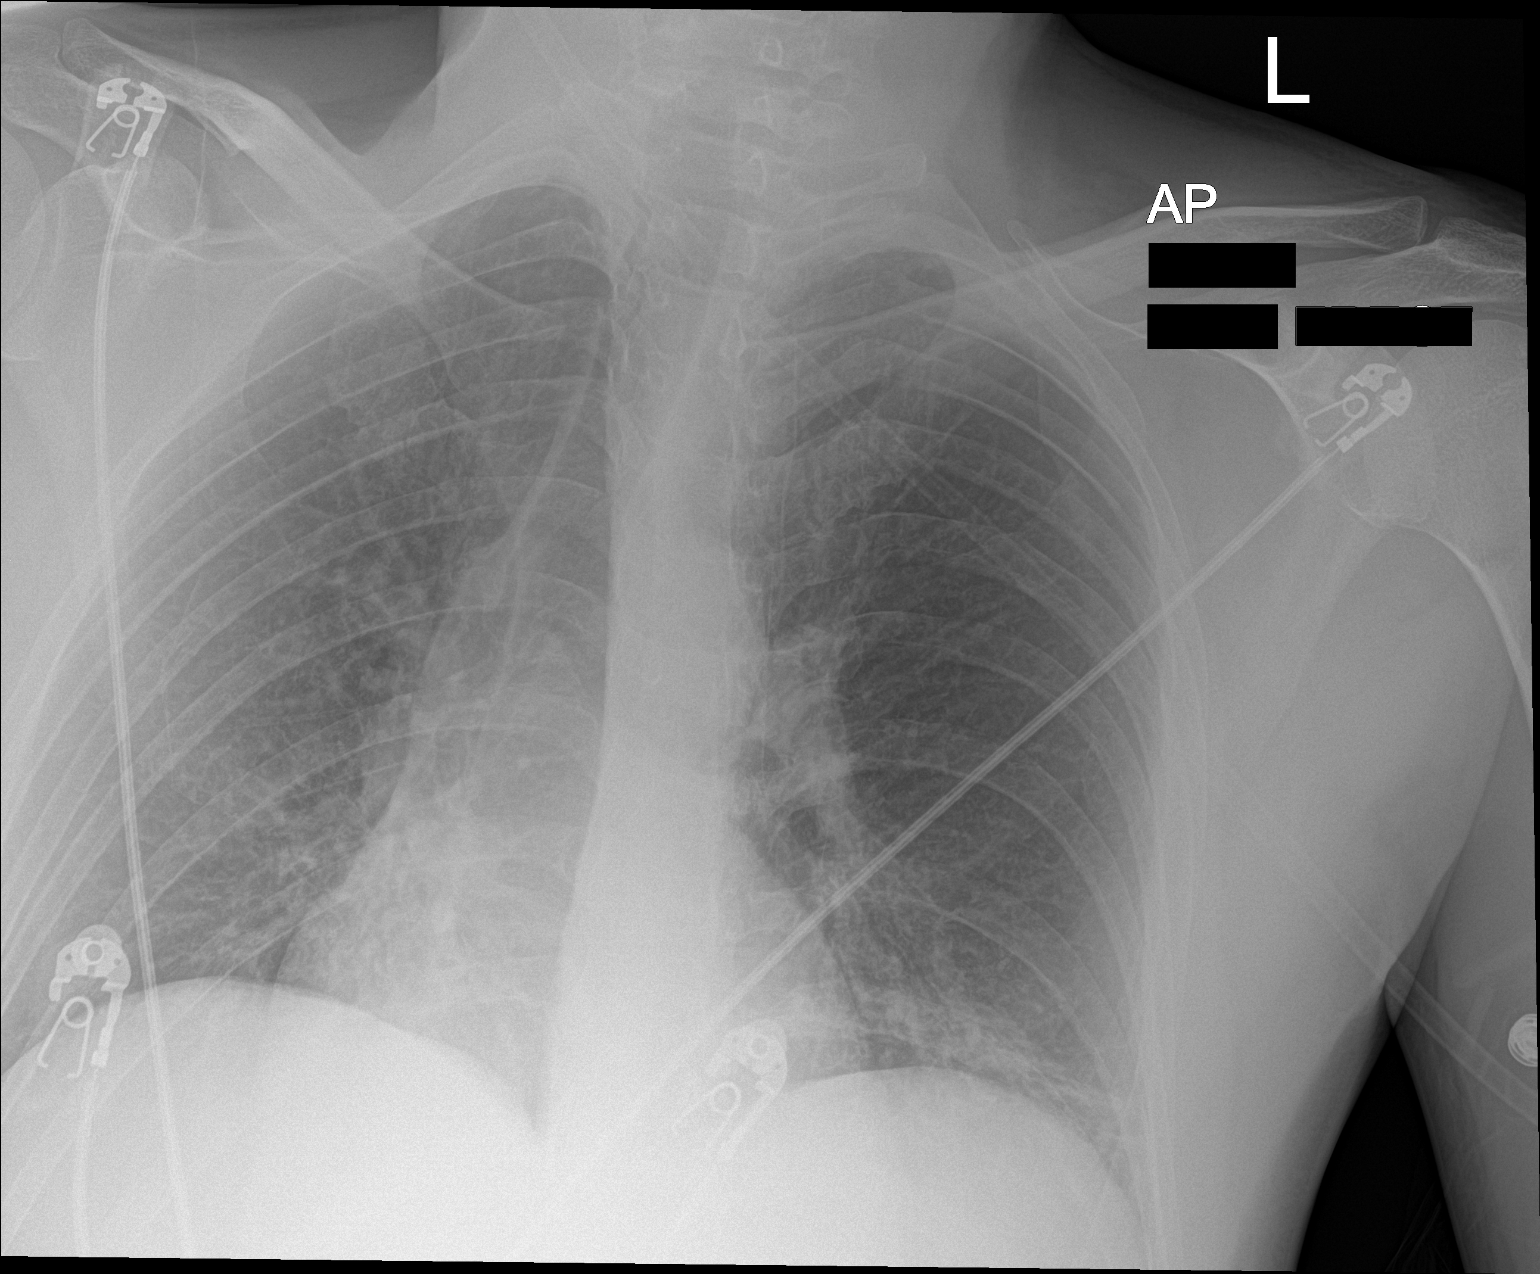

[1 of 1 positions shown; findings below may reference images not displayed]

FINDINGS: Streaky opacity at the left more than right base favoring
atelectasis. Normal heart size and mediastinal contours when
allowing for rightward rotation. The extreme lateral aspect of the
right base is excluded from view. No evidence of effusion or
pneumothorax. No acute osseous finding.
IMPRESSION: Basilar atelectasis.

## 2015-11-20 MED ORDER — PANTOPRAZOLE SODIUM 40 MG PO TBEC
40.0000 mg | DELAYED_RELEASE_TABLET | Freq: Once | ORAL | Status: AC
  Administered 2015-11-20: 40 mg via ORAL
  Filled 2015-11-20: qty 1

## 2015-11-20 MED ORDER — NITROGLYCERIN IN D5W 200-5 MCG/ML-% IV SOLN: 10.0000 ug/min | INTRAVENOUS | Status: DC

## 2015-11-20 MED ORDER — MORPHINE SULFATE (PF) 4 MG/ML IV SOLN
4.0000 mg | Freq: Once | INTRAVENOUS | Status: AC
  Administered 2015-11-20: 4 mg via INTRAVENOUS
  Filled 2015-11-20: qty 1

## 2015-11-20 MED ORDER — PANTOPRAZOLE SODIUM 40 MG PO TBEC: 40.0000 mg | DELAYED_RELEASE_TABLET | Freq: Every day | ORAL | Status: DC

## 2015-11-20 MED ORDER — ASPIRIN 325 MG PO TABS: 325.0000 mg | ORAL_TABLET | Freq: Once | ORAL | Status: DC

## 2015-11-20 MED ORDER — SODIUM CHLORIDE 0.9 % IV BOLUS (SEPSIS)
1000.0000 mL | Freq: Once | INTRAVENOUS | Status: AC
  Administered 2015-11-20: 1000 mL via INTRAVENOUS

## 2015-11-20 MED ORDER — ACETAMINOPHEN 325 MG PO TABS
650.0000 mg | ORAL_TABLET | Freq: Once | ORAL | Status: AC
  Administered 2015-11-20: 650 mg via ORAL
  Filled 2015-11-20: qty 2

## 2015-11-20 NOTE — ED Notes (Signed)
Pt in EMS from home. Does not speak english. Pt reports L sided CP with radiation to L arm, SOB, nausea, emesis. 324 ASA given by EMS.

## 2015-11-20 NOTE — ED Provider Notes (Addendum)
CSN: [id number]     Arrival date & time 11/20/15  4098 History   First MD Initiated Contact with Patient 11/20/15 0532     Chief Complaint  Patient presents with  . Chest Pain  . Headache     (Consider location/radiation/quality/duration/timing/severity/associated sxs/prior Treatment) The history is provided by the patient.  A[place] Reyes is a 40 y.o. male history of recurrent chest pains here presenting with another episode chest pain, dizziness. Patient came here with family And family translating. Patient is from [place]. He has acute onset chest pain last night. States that it is substernal chest pain radiating to the left arm. Associated with some shortness of breath and nausea. EMS gave him 324 aspirin and he was noted to be diaphoretic. He actually had extensive workup including a catheterization that was completely normal last year. He also   has multiple negative d-dimer. He was seen by cardiology multiple times and was thought to have noncardiac chest pain and was started on Protonix but he has not been taking them. Also has been having vertigo and tinnitus and has seen family practice for this and referred to ENT.           Past Medical History  Diagnosis Date  . Kidney stone   . S/P cardiac catheterization 03/2015    cath in [place] and [place], no records,  cath at [facility] normal coronary arteries and normal LV function.  Marland Kitchen GERD (gastroesophageal reflux disease)   . Non-cardiac chest pain    Past Surgical History  Procedure Laterality Date  . Cardiac catheterization    . Hernia repair    . Cardiac catheterization      x 2  . Cardiac catheterization N/A 04/11/2015    Procedure: Left Heart Cath and Coronary Angiography;  Surgeon: Lyn Records, MD;  Location: [facility] INVASIVE CV LAB;  Service: Cardiovascular;  Laterality: N/A;   Family History  Problem Relation Age of Onset  . CAD Father   . Cancer Mother   . Heart attack Father   . Hypertension Father   . Stroke Father   .  Diabetes Mother   . Hypertension Mother    Social History  Substance Use Topics  . Smoking status: Current Every Day Smoker -- 0.25 packs/day    Types: Cigarettes  . Smokeless tobacco: Never Used  . Alcohol Use: No    Review of Systems  Cardiovascular: Positive for chest pain.  Neurological: Positive for headaches.  All other systems reviewed and are negative.     Allergies  Review of patient's allergies indicates no known allergies.  Home Medications   Prior to Admission medications   Medication Sig Start Date End Date Taking? Authorizing Provider  aspirin EC 81 MG tablet Take 1 tablet (81 mg total) by mouth daily. 04/28/15  Yes Leone Brand, NP  nitroGLYCERIN (NITROSTAT) 0.4 MG SL tablet Place 1 tablet (0.4 mg total) under the tongue every 5 (five) minutes as needed for chest pain. 04/06/15  Yes Leone Brand, NP  pantoprazole (PROTONIX) 40 MG tablet Take 1 tablet (40 mg total) by mouth daily. 04/28/15  Yes Leone Brand, NP   BP 136/85 mmHg  Pulse 92  Temp(Src) 98.6 F (37 C) (Oral)  Resp 26  Ht  (1.753 m)  Wt 150 lb (68.04 kg)  BMI 22.14 kg/m2  SpO2 98% Physical Exam  Constitutional: He is oriented to person, place, and time.  Slightly uncomfortable   HENT:  Head: Normocephalic.  Mouth/Throat: Oropharynx is clear and moist.  Eyes: Conjunctivae are normal. Pupils are equal, round, and reactive to light.  No obvious nystagmus   Neck: Normal range of motion. Neck supple.  Cardiovascular: Normal rate, regular rhythm and normal heart sounds.   Pulmonary/Chest: Effort normal and breath sounds normal. No respiratory distress. He has no wheezes. He has no rales.  Abdominal: Soft. Bowel sounds are normal. He exhibits no distension. There is no tenderness. There is no rebound.  Musculoskeletal: Normal range of motion. He exhibits no edema or tenderness.  Neurological: He is alert and oriented to person, place, and time.  Skin: Skin is warm and dry.   Psychiatric: He has a normal mood and affect. His behavior is normal. Judgment and thought content normal.  Nursing note and vitals reviewed.   ED Course  Procedures (including critical care time) Labs Review Labs Reviewed  BASIC METABOLIC PANEL - Abnormal; Notable for the following:    CO2 20 (*)    Glucose, Bld 155 (*)    All other components within normal limits  CBC  HEPATIC FUNCTION PANEL  Rosezena SensorI-STAT TROPOININ, ED    Imaging Review Dg Chest Port 1 View  11/20/2015  CLINICAL DATA:  Chest pain and shortness of breath EXAM: PORTABLE CHEST 1 VIEW COMPARISON:  04/11/2015 FINDINGS: Streaky opacity at the left more than right base favoring atelectasis. Normal heart size and mediastinal contours when allowing for rightward rotation. The extreme lateral aspect of the right base is excluded from view. No evidence of effusion or pneumothorax. No acute osseous finding. IMPRESSION: Basilar atelectasis. Electronically Signed   By: Marnee SpringJonathon  Watts M.D.   On: 11/20/2015 06:55   I have personally reviewed and evaluated these images and lab results as part of my medical decision-making.   EKG Interpretation None      MDM   Final diagnoses:  Chest pain    Lennox Pippinsbrahim A Melrose is a 40 y.o. male here with chest pain. Recent cath completely normal with no CAD at all. Had d-dimer recently that was negative. Cardiology saw patient a month ago and thought he had likely gastritis. He hasn't been compliant with protonix. Will get labs, trop x 1. Given nl coronaries, I doubt ACS.  6:59 AM Labs unremarkable and CXR showed atelectasis. WBC nl, labs at baseline. Given morphine and pain resolved. Felt better. O2 100% on RA now. Also ordered protonix. Told him to take protonix and see family practice for follow up next week.      Richardean Canalavid H Yao, MD 11/20/15 [id number]  Richardean Canalavid H Yao, MD 11/20/15 [phone]

## 2015-11-20 NOTE — ED Notes (Signed)
Patient left at this time with all belongings. 

## 2015-11-20 NOTE — Discharge Instructions (Signed)
You need to take protonix daily as prescribed.   You need to see your doctor in 2-3 days.   Return to ER if you have worse chest pain, shortness of breath, dizziness.

## 2015-12-21 ENCOUNTER — Ambulatory Visit
Admission: RE | Admit: 2015-12-21 | Discharge: 2015-12-21 | Disposition: A | Discharge status: Home / Self Care | Payer: No Typology Code available for payment source | Source: Ambulatory Visit | Attending: Infectious Disease | Admitting: Infectious Disease

## 2015-12-21 ENCOUNTER — Other Ambulatory Visit: Payer: Self-pay | Admitting: Infectious Disease

## 2015-12-21 DIAGNOSIS — A150 Tuberculosis of lung: Secondary | ICD-10-CM

## 2015-12-21 IMAGING — CR DG CHEST 1V
1 series · 1 of 1 positions shown · non-contrast
Comparison: [DATE]

CLINICAL DATA: Positive tuberculin skin test

EXAM:
CHEST 1 VIEW

[w chest pa]
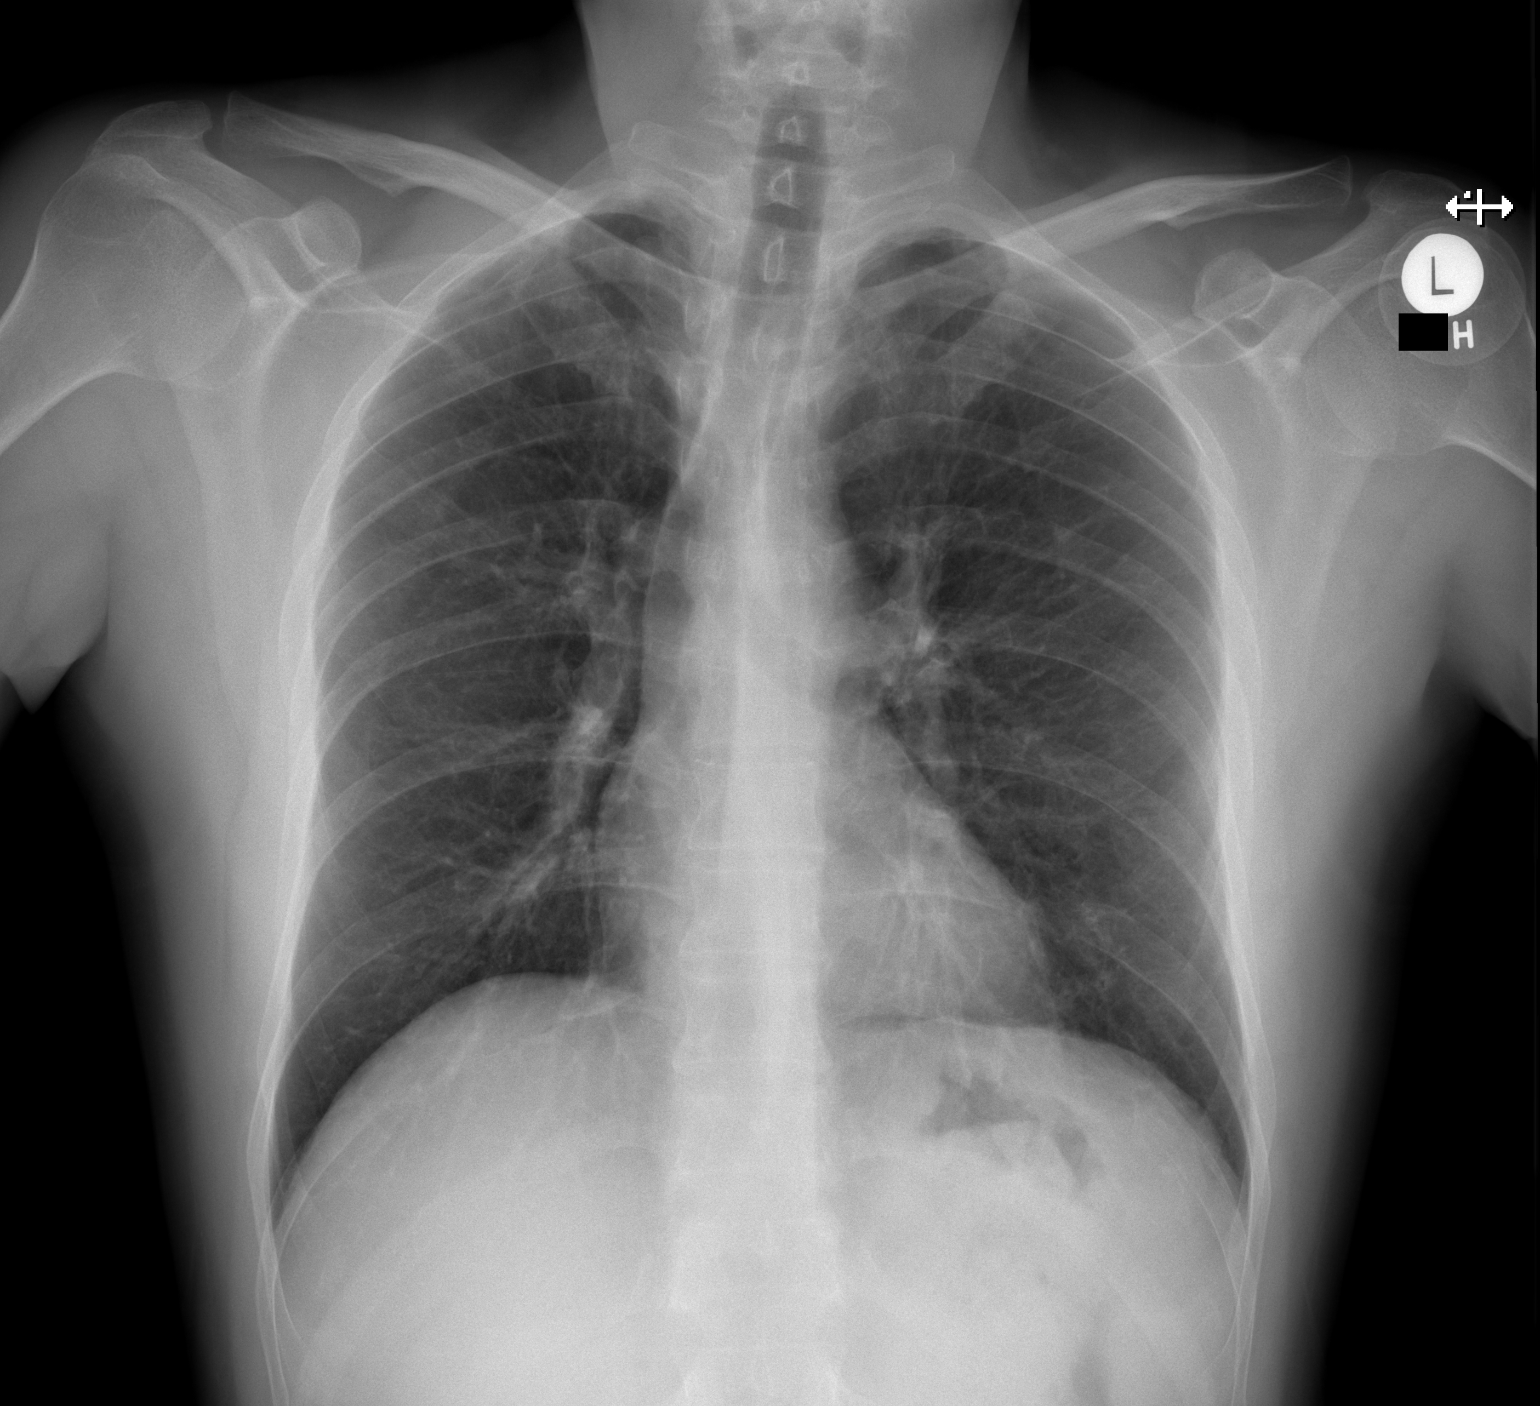

[1 of 1 positions shown; findings below may reference images not displayed]

FINDINGS: Lungs are clear. Heart size and pulmonary vascularity are normal. No
adenopathy. No bone lesions.
IMPRESSION: Lungs clear.  No adenopathy.

## 2015-12-27 ENCOUNTER — Telehealth: Payer: Self-pay | Admitting: Family Medicine

## 2015-12-27 ENCOUNTER — Encounter: Payer: Self-pay | Admitting: Internal Medicine

## 2015-12-27 ENCOUNTER — Ambulatory Visit (INDEPENDENT_AMBULATORY_CARE_PROVIDER_SITE_OTHER): Payer: Medicaid Other | Admitting: Internal Medicine

## 2015-12-27 VITALS — BP 115/70 | HR 87 | Temp 98.1°F | Ht 69.0 in | Wt 147.0 lb

## 2015-12-27 DIAGNOSIS — F4322 Adjustment disorder with anxiety: Secondary | ICD-10-CM

## 2015-12-27 DIAGNOSIS — R7612 Nonspecific reaction to cell mediated immunity measurement of gamma interferon antigen response without active tuberculosis: Secondary | ICD-10-CM

## 2015-12-27 HISTORY — DX: Nonspecific reaction to cell mediated immunity measurement of gamma interferon antigen response without active tuberculosis: R76.12

## 2015-12-27 NOTE — Assessment & Plan Note (Addendum)
Patient does have recent negative CXR on 7/5 with clear lungs and no adenopathy. No symptoms of active TB on history. Was unable to find documentation from [facility] that we were aware of these test results. Tamika, RN has faxed results to health department. Unable to reach TB phone  -f/u with health department regarding test results/treatment plan for patient  -PCP f/u scheduled

## 2015-12-27 NOTE — Progress Notes (Signed)
   Subjective:    Aaron Reyes - 40 y.o. male MRN [id number]  Date of birth: May 20, 1976  HPI  A[place] Re[place] is here for follow up regarding anxiety but upon chart review was found to have positive Quant Gold.   Positive Quantiferon Gold:  Result from 03/18/15. Patient reports that she has been seen at "the [facility] [address] very recently. Got a chest x-ray about 1 week ago and is still awaiting the results. Denies new cough, bloody sputum, unintentional weight loss, fevers, chest pain, SOB, and night sweats.  Anxiety/Mood Disturbance: Reports that he is having a lot of trouble watching his family suffer. Tells story about his wife having a panic attack after seeing a dog while out walking. He had to practically carry her home she was so upset. He reports that one of his sons is afraid to get out of bed at night to go the bathroom alone for fear of having a dog there. Says that he does not believe he himself is having "mental problems" but that he is having stress watching his family suffer.    -  reports that he has been smoking Cigarettes.  He has been smoking about 0.25 packs per day. He has never used smokeless tobacco. - Review of Systems: Per HPI. - Past Medical History: Patient Active Problem List   Diagnosis Date Noted  . 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 Back pain 04/26/2015  . Adjustment disorder with anxious mood 04/26/2015  . 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 Dysuria 04/26/2015  . Insect bite 04/19/2015  . Refugee health examination 03/18/2015  . Housing problems 03/18/2015  . Testicular mass 03/18/2015  . Inguinal hernia 03/18/2015  . Chest pain 02/15/2015  . Hypokalemia 02/15/2015   - Medications: reviewed and updated    Objective:   Physical Exam BP 115/70 mmHg  Pulse 87  Temp(Src) 98.1 F (36.7 C) (Oral)  Ht 5\' 9"  (1.753 m)  Wt 147 lb (66.679 kg)  BMI 21.70 kg/m2 Gen: NAD, alert, cooperative with exam, well-appearing HEENT: NCAT,  PERRL, clear conjunctiva, oropharynx clear, supple neck CV: RRR, good S1/S2, no murmur, no edema, capillary refill brisk  Resp: CTABL, no wheezes, non-labored Abd: SNTND, BS present, no guarding or organomegaly Skin: no rashes, normal turgor  Neuro: no gross deficits.  Psych: good insight, alert and oriented     Assessment & Plan:   Positive QuantiFERON-TB Gold test Has negative CXR on 7/5 with clear lungs and no adenopathy. No symptoms of active TB on history. Was unable to find documentation from [facility] that we were aware of these test results. Tamika, RN has faxed results to health department.  -f/u if health department regarding test results/treatment plan for patient  -PCP f/u scheduled   Adjustment disorder with anxious mood Patient agreeable to Integrated Care consult today (please see their separate documentation). Upon chart review, anxiety appears to be ongoing problem for patient. He is not currently an active threat to himself or others.  -recommend continued [facility]  -likely needs more formalized evaluation of anxiety and possible pharmacologic treatment, will defer to PCP      Marcy Sirenatherine Wallace, D.O. 12/27/2015, 3:32 PM PGY-2, [facility]

## 2015-12-27 NOTE — Assessment & Plan Note (Addendum)
Patient agreeable to Integrated Care consult today (please see their separate documentation). Upon chart review, anxiety appears to be ongoing problem for patient with multiple life stressors. He is not currently an active threat to himself or others.  -recommend continued [facility] (scheduled for next week)  -refugee resources provided  -will defer discussion of potential pharmacologic treatment to PCP
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
## 2015-12-27 NOTE — Telephone Encounter (Signed)
Patient discussed with me. He had positive quatiferone gold at the [facility]. There was no documentation on Epic if this was discussed with patient or treated. Patient seen today. Patient per Dr. Earlene PlaterWallace had been seen at the HD. I called HD to confirm this. They stated No one reported her + Quatiferone gold to them from our clinic  From 03/23/15 but they tested him 03/28/15 at the health department, it came back positive. Xray was ordered but they never got the result. They have tried to reach him multiple times with no response.  Recommendation to Dr. Earlene PlaterWallace.  Xray done in June was normal, please fax to the HD ( Tammy) at [phone]. Contact patient about follow up at the HD for latent TB treatment. His information is already at the HD. Dr. Earlene PlaterWallace will report this on SZP.  I spoke with Tammy the TB [facility].  Dr. Earlene PlaterWallace and PCP to follow.

## 2015-12-28 ENCOUNTER — Telehealth: Payer: Self-pay | Admitting: Internal Medicine

## 2015-12-28 ENCOUNTER — Ambulatory Visit: Payer: Medicaid Other | Attending: Audiology | Admitting: Audiology

## 2015-12-28 NOTE — Telephone Encounter (Signed)
Called patient to discuss need to follow up with Health Dept for positive Quant Gold. Please see note from clinic visit on 7/11 for further information. Patient was lost to f/u for latent TB and was not aware that he needed treatment.  Informed patient that although he does not have active pulmonary TB, he needs treatment for latent TB.   The same applies to his wife and this was conveyed to the patient. Importance that both need to follow up with HD was emphasized. Address of HD was given as well.   Marcy Sirenatherine Patrizia Paule, D.O. 12/28/2015, 12:19 PM PGY-2, [facility]

## 2016-01-02 NOTE — Telephone Encounter (Signed)
A letter was sent to the health department on 03/22/2015 from our clinic with the positive results.  It's located under the "Letter" section of the chart.  These are faxed directly to the HD nurse.  Thank you to everyone following up with this.  If there are any questions, please let me know.    Also -- I spoke with Dr. Pascal LuxKane about this patient today.  There are numerous social concerns, including the fact he may be evicted from his house soon with his family.  She recommended I [facility] who me with him.  I will speak with her when she is here in clinic tomorrow.

## 2016-01-03 ENCOUNTER — Ambulatory Visit: Payer: Medicaid Other

## 2016-01-23 ENCOUNTER — Ambulatory Visit (INDEPENDENT_AMBULATORY_CARE_PROVIDER_SITE_OTHER): Payer: Medicaid Other | Admitting: Family Medicine

## 2016-01-23 ENCOUNTER — Encounter: Payer: Self-pay | Admitting: Family Medicine

## 2016-01-23 VITALS — BP 133/83 | HR 92 | Temp 98.5°F | Ht 69.0 in | Wt 147.2 lb

## 2016-01-23 DIAGNOSIS — H109 Unspecified conjunctivitis: Secondary | ICD-10-CM

## 2016-01-23 DIAGNOSIS — Z789 Other specified health status: Secondary | ICD-10-CM

## 2016-01-23 MED ORDER — POLYMYXIN B-TRIMETHOPRIM 10000-0.1 UNIT/ML-% OP SOLN: 1.0000 [drp] | Freq: Four times a day (QID) | OPHTHALMIC | 0 refills | Status: DC

## 2016-01-23 MED ORDER — POLYMYXIN B-TRIMETHOPRIM 10000-0.1 UNIT/ML-% OP SOLN: 1.0000 [drp] | OPHTHALMIC | 0 refills | Status: DC

## 2016-01-23 NOTE — Progress Notes (Signed)
    Subjective: CC: left eye pain HPI: [place] is a 40 y.o. male presenting to clinic today for office visit. Concerns today include:  Arabic interpretation provided by [employer] interpreter ID# [id number]  1. Eye pain Patient notes that he developed left eye irritation that started about 1 week ago.  He notes that it feels burning.  He notes that he is having a headache.  He has never had anything like this before.  He denies pain with ocular movement.  He reports discharge in the morning.  Reports subjective fever.  He denies cough, congestion, rhinorrhea.  He feels like it is intermittently blurry when it gets really red.  He notes that symptoms started after he got really mad and the next day he woke up with it.  Denies foreign body sensation.  Social History Reviewed: 1-2 cigarette daily smoker. FamHx and MedHx reviewed.  Please see EMR. Health Maintenance: flu vaccine  ROS: Per HPI  Objective: Office vital signs reviewed. BP 133/83 (BP Location: Left Arm, Patient Position: Sitting, Cuff Size: Normal)   Pulse 92   Temp 98.5 F (36.9 C) (Oral)   Ht 5\' 9"  (1.753 m)   Wt 147 lb 3.2 oz (66.8 kg)   BMI 21.74 kg/m   Physical Examination:  General: Awake, alert, well nourished, No acute distress HEENT: Normal    Neck: No masses palpated. No lymphadenopathy    Ears: Tympanic membranes intact, normal light reflex, no erythema, no bulging    Eyes: PERRLA, painless EOMI, conjunctival injection throughout left eye.  No lesions appreciated on fluorecin examination.  Possible pterygium at 7 o'clock position of left eye.  No exudate/purulence on exam.  Assessment/ Plan: 40 y.o. male   1. Conjunctivitis of left eye.  No corneal abrasions appreciated on fluorecin examination.  No pain with extraocular motor testing.  Normal pupillary response.  Will treat as bacterial conjunctivitis - Strict return precautions reviewed with patient.  He is to seek immediate medical attention if:  pain with EOMI, worsening conjunctival injection, blurred vision/ visual disturbance. - trimethoprim-polymyxin b (POLYTRIM) ophthalmic solution; Place 1 drop into both eyes every 6 (six) hours. x7 days  Dispense: 10 mL; Refill: 0 - Follow up on Thursday 8/1 @ 4pm for TOPC   Raliegh IpAshly M Mayuri Staples, DO PGY-3, [facility]

## 2016-01-23 NOTE — Patient Instructions (Addendum)
Follow up with me on 8/10 @ 4pm.  We will discuss anxiety medications at that time.  Use your eye drop every 6 hours for next 7 days.

## 2016-01-24 ENCOUNTER — Ambulatory Visit (INDEPENDENT_AMBULATORY_CARE_PROVIDER_SITE_OTHER): Payer: Medicaid Other | Admitting: Psychology

## 2016-01-24 DIAGNOSIS — F4322 Adjustment disorder with anxiety: Secondary | ICD-10-CM
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
## 2016-01-25 ENCOUNTER — Telehealth: Payer: Self-pay | Admitting: Licensed Clinical Social Worker

## 2016-01-25 NOTE — Progress Notes (Signed)
[facility]  Patient is concerned that he was informed he could not get foodstamps because he is not working.  It is correct the families cannot receive food stamps if they are not working at least 20 hours or in school.  However this does not apply if the family members is aged, disabled or have young children in the home. Call to patient via [employer] [phone] to explain this to him. Patient states he did receive his food stamps today in the amount of $977.00.  Patient was appreciative of CSW calling.   Sammuel Hineseborah Gissela Bloch, LCSW Licensed Clinical Social Worker [facility]   [phone] 2:53 PM

## 2016-01-26 ENCOUNTER — Encounter: Payer: Self-pay | Admitting: Family Medicine

## 2016-01-26 ENCOUNTER — Ambulatory Visit (INDEPENDENT_AMBULATORY_CARE_PROVIDER_SITE_OTHER): Payer: Medicaid Other | Admitting: Family Medicine

## 2016-01-26 VITALS — BP 121/75 | HR 86 | Temp 98.4°F | Ht 69.0 in | Wt 145.6 lb

## 2016-01-26 DIAGNOSIS — F411 Generalized anxiety disorder: Secondary | ICD-10-CM

## 2016-01-26 DIAGNOSIS — R319 Hematuria, unspecified: Secondary | ICD-10-CM | POA: Diagnosis not present

## 2016-01-26 DIAGNOSIS — H9313 Tinnitus, bilateral: Secondary | ICD-10-CM

## 2016-01-26 DIAGNOSIS — R079 Chest pain, unspecified: Secondary | ICD-10-CM | POA: Diagnosis not present

## 2016-01-26 DIAGNOSIS — R300 Dysuria: Secondary | ICD-10-CM | POA: Diagnosis not present

## 2016-01-26 DIAGNOSIS — F4310 Post-traumatic stress disorder, unspecified: Secondary | ICD-10-CM | POA: Diagnosis not present

## 2016-01-26 LAB — POCT URINALYSIS DIPSTICK
GLUCOSE UA: NEGATIVE
KETONES UA: NEGATIVE
Leukocytes, UA: NEGATIVE
Nitrite, UA: NEGATIVE
Urobilinogen, UA: 1
pH, UA: 5.5

## 2016-01-26 MED ORDER — CITALOPRAM HYDROBROMIDE 10 MG PO TABS: 10.0000 mg | ORAL_TABLET | Freq: Every day | ORAL | 0 refills | Status: DC

## 2016-01-26 NOTE — Assessment & Plan Note (Signed)
Do not suspect that chest pain is cardiac in nature.  More likely to be psychosomatic in nature.  Unclear PMH of CAD with possible cath in [place].  However, [facility] with [facility]up. - Will start Lexapro. - Continue CMT w/ integrated care - Will continue to try and get patient connected with a psychiatrist as well
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
## 2016-01-26 NOTE — Patient Instructions (Signed)
Make an appointment to see me in 1 month.
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
## 2016-01-26 NOTE — Assessment & Plan Note (Signed)
Language/ cultural barriers make GAD 7 difficult to obtain.  Patient actively being followed by Integrated care.  Goal: have patient establish with psychiatry for more intensive therapy/ family counseling. - Lexapro  initiated at today's visit. - Plan to follow up next week with integrated care - Follow up with me in 1 month - Side effects and return precautions reviewed.

## 2016-01-26 NOTE — Assessment & Plan Note (Signed)
No suprapubic TTP, no CVA TTP.  UA with small blood and small bilirubin. Nitrite/ Leukocyte negative.  VS WNL.  Likely having urethral irritation after intercourse.  Wife with negative GC/CT, so I have low suspicion that patient has STI. - Urine sent for culture - If negative UCx will need to repeat UA and possibly obtain renal function studies.

## 2016-01-26 NOTE — Assessment & Plan Note (Signed)
I suspect this is a post traumatic tinnitus vs psychosomatic manifestation of anxiety.  It appears he has an audiology referral in place.  This is reasonable.  Lexapro prescribed during today's appointment, which should help with anxiety/ PTSD symptoms.

## 2016-01-27 LAB — URINE CULTURE: ORGANISM ID, BACTERIA: NO GROWTH

## 2016-01-31 ENCOUNTER — Ambulatory Visit (INDEPENDENT_AMBULATORY_CARE_PROVIDER_SITE_OTHER): Payer: Medicaid Other | Admitting: Psychology

## 2016-01-31 DIAGNOSIS — F4322 Adjustment disorder with anxiety: Secondary | ICD-10-CM

## 2016-01-31 NOTE — Progress Notes (Signed)
Reason for follow-up:  Anxiety, stress  Issues discussed: Recommendations for psychological care for family, deep breathing relaxation. Patient was also present during his wife's appointment during which psychoeducation about PTSD was provided.

## 2016-01-31 NOTE — Assessment & Plan Note (Signed)
Assessment/Plan/Recommendations: [facility] gave [facility] and his wife [facility] [place]. The family plans to go there to seek psychological services for their children.   Patient is interested in speaking with  social work about applying for disability benefits. He reports that it is still not possible for him to work given his continued anxiety and keeping up with the appointments for his wife and children.   [facility] led the patient and his wife in deep breathing relaxation and recommended that he practice this for several minutes every night before he goes to bed. [facility] plans to follow up with this family in 2 weeks to see how their visit to family services goes and to provide additional support if needed at that time.

## 2016-02-08 ENCOUNTER — Ambulatory Visit: Payer: Medicaid Other | Admitting: Family Medicine

## 2016-02-14 ENCOUNTER — Ambulatory Visit: Payer: Medicaid Other

## 2016-03-05 ENCOUNTER — Ambulatory Visit (HOSPITAL_COMMUNITY)
Admission: EM | Admit: 2016-03-05 | Discharge: 2016-03-05 | Disposition: A | Discharge status: Home / Self Care | Payer: Medicaid Other

## 2016-03-06 ENCOUNTER — Ambulatory Visit (INDEPENDENT_AMBULATORY_CARE_PROVIDER_SITE_OTHER): Payer: Medicaid Other | Admitting: Psychology

## 2016-03-06 DIAGNOSIS — F411 Generalized anxiety disorder: Secondary | ICD-10-CM
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
## 2016-03-06 NOTE — Progress Notes (Signed)
[facility] met jointly with Aaron Reyes and his wife Aaron Reyes   Reason for follow-up:  Anxiety, trauma  Issues discussed:  [facility] discussed plan of care for family, [name]'s current symptomology, and pleasant activity scheduling. [name] reported that the family is moving to a new home on Friday, located at: [address] [place], [place], [place].

## 2016-03-22 ENCOUNTER — Emergency Department (HOSPITAL_COMMUNITY)
Admission: EM | Admit: 2016-03-22 | Discharge: 2016-03-23 | Disposition: A | Discharge status: Home / Self Care | Payer: Medicaid Other | Attending: Emergency Medicine | Admitting: Emergency Medicine

## 2016-03-22 ENCOUNTER — Emergency Department (HOSPITAL_COMMUNITY): Payer: Medicaid Other

## 2016-03-22 ENCOUNTER — Encounter (HOSPITAL_COMMUNITY): Payer: Self-pay | Admitting: Emergency Medicine

## 2016-03-22 DIAGNOSIS — Y9389 Activity, other specified: Secondary | ICD-10-CM | POA: Insufficient documentation

## 2016-03-22 DIAGNOSIS — Y92410 Unspecified street and highway as the place of occurrence of the external cause: Secondary | ICD-10-CM | POA: Insufficient documentation

## 2016-03-22 DIAGNOSIS — R109 Unspecified abdominal pain: Secondary | ICD-10-CM | POA: Diagnosis not present

## 2016-03-22 DIAGNOSIS — Z79899 Other long term (current) drug therapy: Secondary | ICD-10-CM | POA: Diagnosis not present

## 2016-03-22 DIAGNOSIS — Z7982 Long term (current) use of aspirin: Secondary | ICD-10-CM | POA: Diagnosis not present

## 2016-03-22 DIAGNOSIS — M542 Cervicalgia: Secondary | ICD-10-CM | POA: Diagnosis not present

## 2016-03-22 DIAGNOSIS — Y999 Unspecified external cause status: Secondary | ICD-10-CM | POA: Insufficient documentation

## 2016-03-22 DIAGNOSIS — R51 Headache: Secondary | ICD-10-CM | POA: Insufficient documentation

## 2016-03-22 DIAGNOSIS — R0689 Other abnormalities of breathing: Secondary | ICD-10-CM | POA: Diagnosis not present

## 2016-03-22 DIAGNOSIS — F17210 Nicotine dependence, cigarettes, uncomplicated: Secondary | ICD-10-CM | POA: Diagnosis not present

## 2016-03-22 LAB — ETHANOL: Alcohol, Ethyl (B): <5 mg/dL (ref <5)

## 2016-03-22 LAB — CBC WITH DIFFERENTIAL/PLATELET
BASOS ABS: 0 10*3/uL (ref 0.0–0.1)
BASOS PCT: 0 %
EOS ABS: 0.1 10*3/uL (ref 0.0–0.7)
Eosinophils Relative: 1 %
HCT: 43 % (ref 39.0–52.0)
HEMOGLOBIN: 15.2 g/dL (ref 13.0–17.0)
Lymphocytes Relative: 34 %
Lymphs Abs: 3 10*3/uL (ref 0.7–4.0)
MCH: 30 pg (ref 26.0–34.0)
MCHC: 35.3 g/dL (ref 30.0–36.0)
MCV: 84.8 fL (ref 78.0–100.0)
Monocytes Absolute: 0.3 10*3/uL (ref 0.1–1.0)
Monocytes Relative: 3 %
NEUTROS ABS: 5.4 10*3/uL (ref 1.7–7.7)
NEUTROS PCT: 62 %
PLATELETS: 252 10*3/uL (ref 150–400)
RBC: 5.07 MIL/uL (ref 4.22–5.81)
RDW: 12.5 % (ref 11.5–15.5)
WBC: 8.8 10*3/uL (ref 4.0–10.5)

## 2016-03-22 LAB — BASIC METABOLIC PANEL
Anion gap: 4 — ABNORMAL LOW (ref 5–15)
BUN: 14 mg/dL (ref 6–20)
CALCIUM: 8.9 mg/dL (ref 8.9–10.3)
CO2: 23 mmol/L (ref 22–32)
CREATININE: 0.82 mg/dL (ref 0.61–1.24)
Chloride: 117 mmol/L — ABNORMAL HIGH (ref 101–111)
GFR calc non Af Amer: >60 mL/min (ref >60)
Glucose, Bld: 114 mg/dL — ABNORMAL HIGH (ref 65–99)
Potassium: 3.9 mmol/L (ref 3.5–5.1)
SODIUM: 144 mmol/L (ref 135–145)

## 2016-03-22 LAB — TYPE AND SCREEN
ABO/RH(D): A POS
ANTIBODY SCREEN: NEGATIVE

## 2016-03-22 LAB — RAPID URINE DRUG SCREEN, HOSP PERFORMED
AMPHETAMINES: NOT DETECTED
BARBITURATES: NOT DETECTED
Benzodiazepines: NOT DETECTED
Cocaine: NOT DETECTED
Opiates: NOT DETECTED
TETRAHYDROCANNABINOL: NOT DETECTED
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
## 2016-03-22 MED ORDER — ONDANSETRON HCL 4 MG/2ML IJ SOLN
4.0000 mg | Freq: Once | INTRAMUSCULAR | Status: AC
  Administered 2016-03-22: 4 mg via INTRAVENOUS
  Filled 2016-03-22: qty 2

## 2016-03-22 MED ORDER — FENTANYL CITRATE (PF) 100 MCG/2ML IJ SOLN
50.0000 ug | Freq: Once | INTRAMUSCULAR | Status: AC
  Administered 2016-03-22: 50 ug via INTRAVENOUS
  Filled 2016-03-22: qty 2

## 2016-03-22 MED ORDER — LORAZEPAM 2 MG/ML IJ SOLN
0.5000 mg | Freq: Once | INTRAMUSCULAR | Status: AC
  Administered 2016-03-22: 0.5 mg via INTRAVENOUS
  Filled 2016-03-22: qty 1

## 2016-03-22 MED ORDER — IOPAMIDOL (ISOVUE-300) INJECTION 61%
100.0000 mL | Freq: Once | INTRAVENOUS | Status: AC | PRN
  Administered 2016-03-22: 100 mL via INTRAVENOUS

## 2016-03-22 MED ORDER — SODIUM CHLORIDE 0.9 % IV SOLN
INTRAVENOUS | Status: DC
  Administered 2016-03-22: 22:00:00 via INTRAVENOUS

## 2016-03-22 NOTE — ED Notes (Signed)
IN CT  
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
## 2016-03-23 LAB — ABO/RH: ABO/RH(D): A POS

## 2016-03-23 MED ORDER — KETOROLAC TROMETHAMINE 30 MG/ML IJ SOLN
30.0000 mg | Freq: Once | INTRAMUSCULAR | Status: AC
  Administered 2016-03-23: 30 mg via INTRAVENOUS
  Filled 2016-03-23: qty 1

## 2016-03-23 MED ORDER — OXYCODONE-ACETAMINOPHEN 5-325 MG PO TABS: 2.0000 | ORAL_TABLET | ORAL | 0 refills | Status: DC | PRN

## 2016-03-23 MED ORDER — DIAZEPAM 2 MG PO TABS: 2.0000 mg | ORAL_TABLET | ORAL | 0 refills | Status: DC | PRN

## 2016-03-24 IMAGING — US TESTIS
1 series · 14 of 16 positions shown · non-contrast
Comparison: None.

CLINICAL DATA: Testicular mass

EXAM:
ULTRASOUND OF SCROTUM
TECHNIQUE: Complete ultrasound examination of the testicles, epididymis, and
other scrotal structures was performed.

[Series 1: testis · 0.07mm/px · 69 acquisitions, 14 frames shown]
[im 1/69]
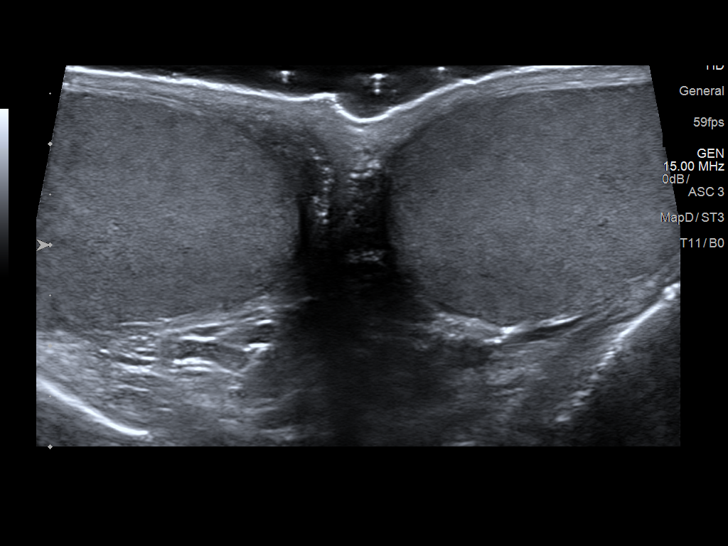
[im 5/69]
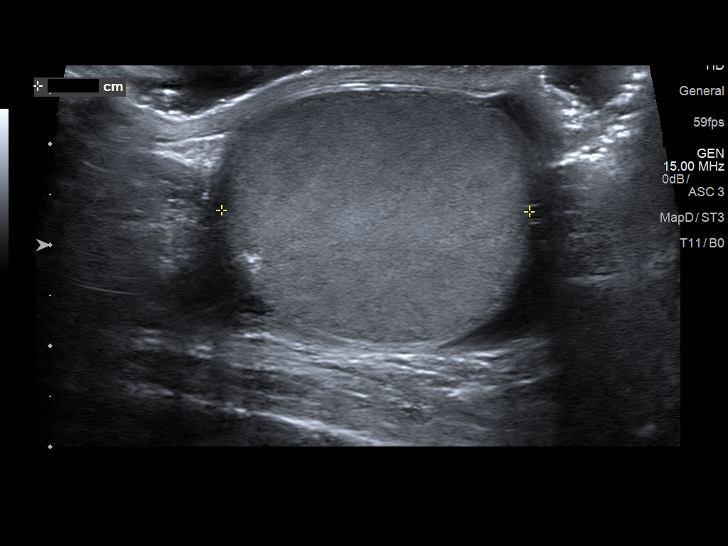
[im 10/69]
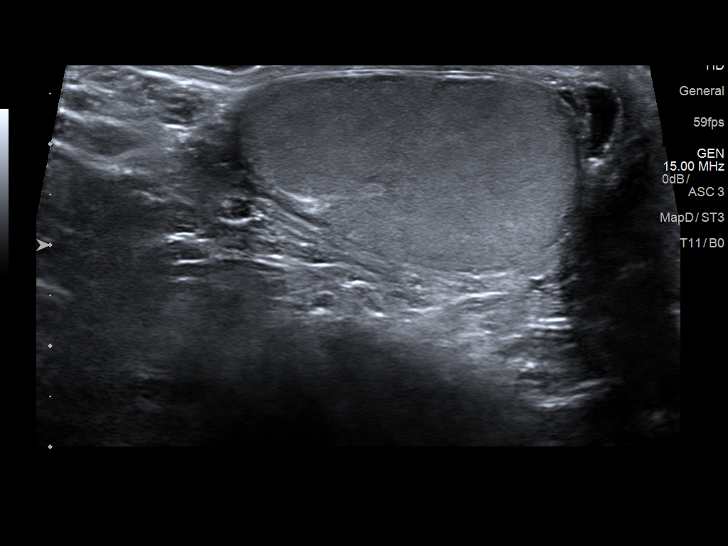
[im 19/69]
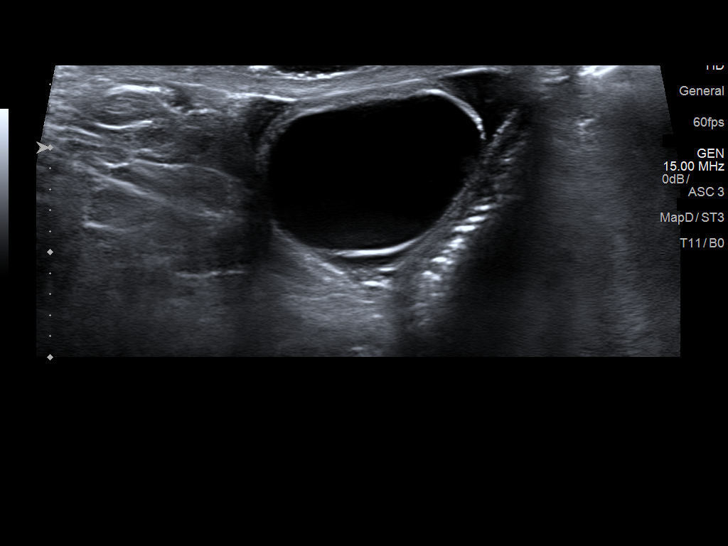
[im 23/69]
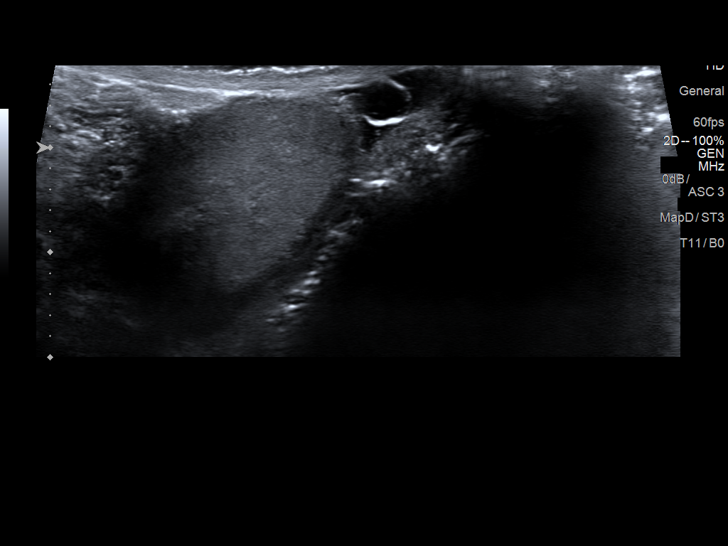
[im 28/69]
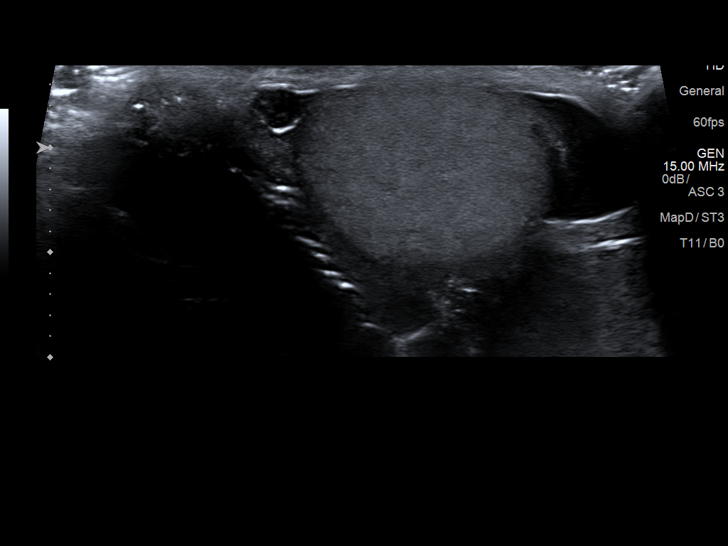
[im 32/69]
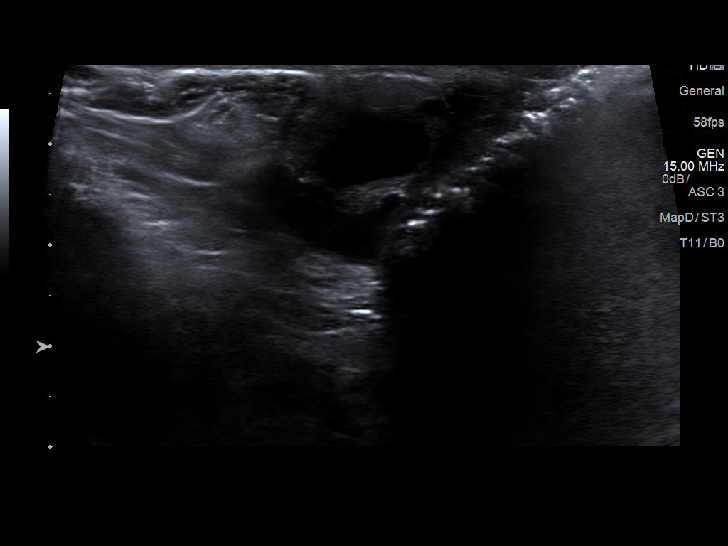
[im 37/69]
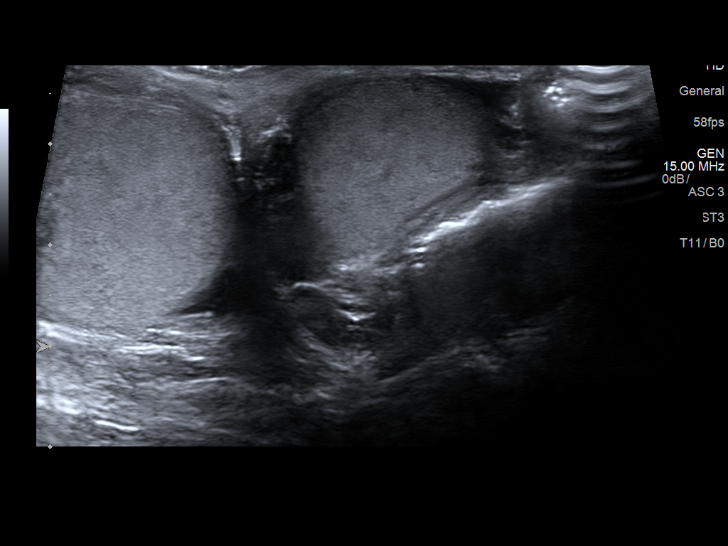
[im 41/69]
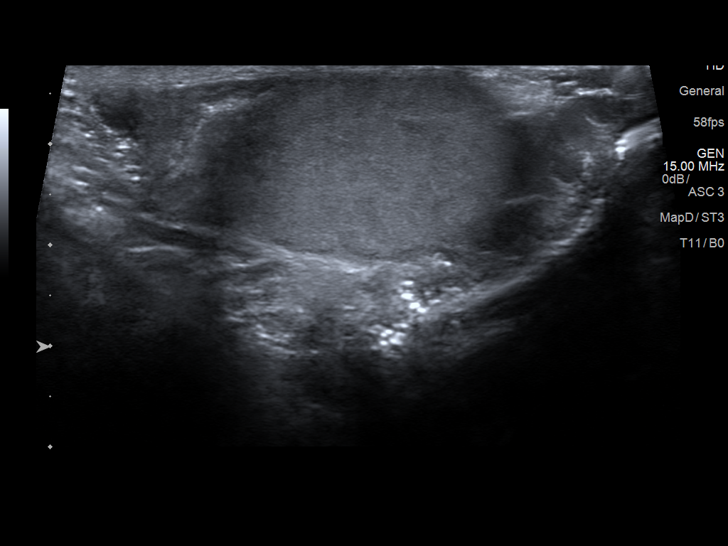
[im 46/69]
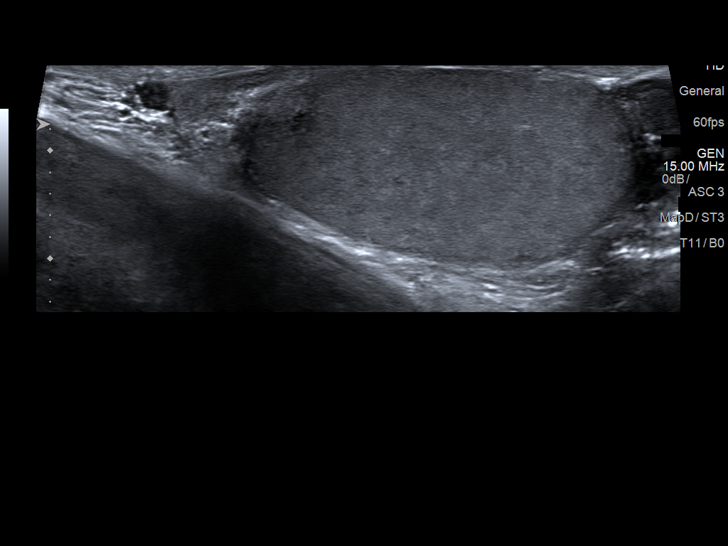
[im 55/69]
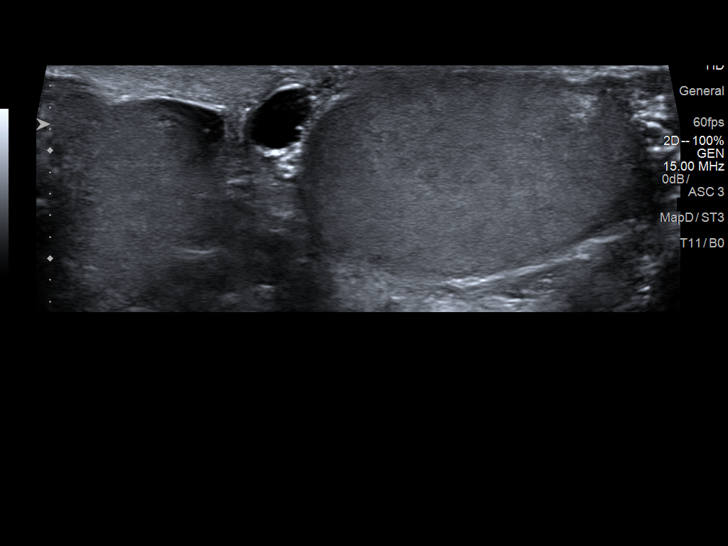
[im 59/69]
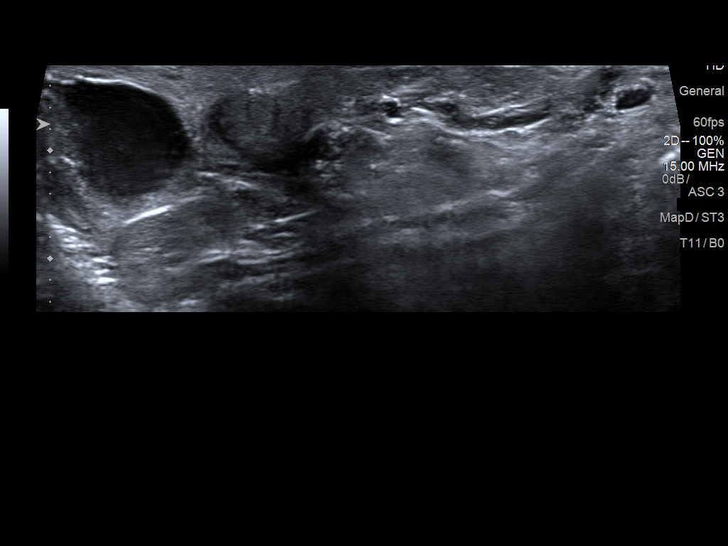
[im 64/69]
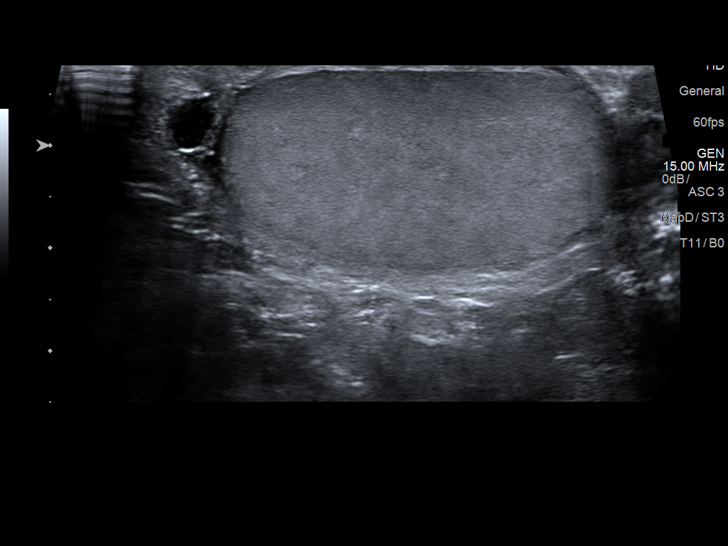
[im 69/69]
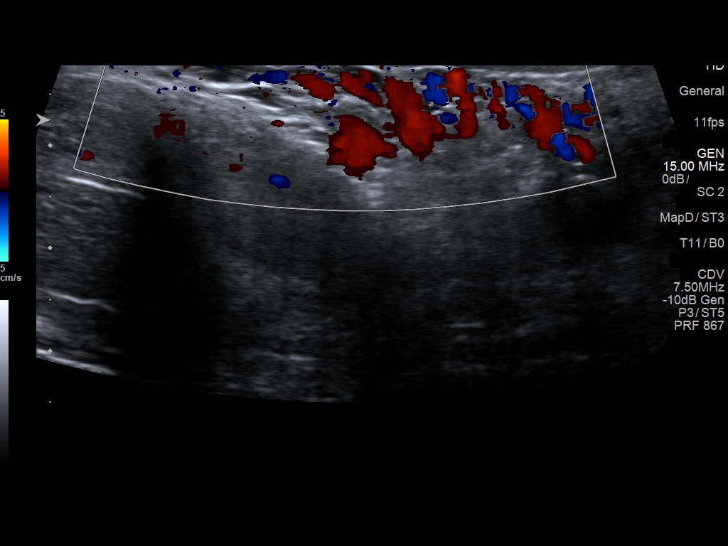

[14 of 16 positions shown; findings below may reference images not displayed]

FINDINGS: Right testicle

Measurements: 3.7 x 2.2 x 3.1 cm. No mass or microlithiasis
visualized.

Left testicle

Measurements: 3.9 x 2.2 x 3 cm. No mass or microlithiasis
visualized.

Right epididymis: There are cysts within the right epididymis,
largest measures 2 x 1.4 x 1.7 cm.

Left epididymis: There is a 0.9 x 0.5 x 0.6 cm left epididymal cyst.

Hydrocele:  There are small bilateral hydroceles.

Varicocele:  There are bilateral varicoceles.
IMPRESSION: No evidence of mass within the bilateral testis.

There are bilateral epididymal cysts. The largest is in the right
epididymis measures 2 x 1.4 x 1.7 cm.

## 2016-03-27 ENCOUNTER — Ambulatory Visit: Payer: Medicaid Other

## 2016-03-27 ENCOUNTER — Telehealth: Payer: Self-pay

## 2016-03-27 NOTE — Telephone Encounter (Signed)
[facility]. Patient states he tried to call and cancel appt but phone did not pick up. Appointment rescheduled for 10/17 at 3:30pm with Boneta LucksJenny

## 2016-04-03 ENCOUNTER — Ambulatory Visit: Payer: Medicaid Other

## 2016-04-22 ENCOUNTER — Encounter (HOSPITAL_COMMUNITY): Payer: Self-pay | Admitting: *Deleted

## 2016-04-22 ENCOUNTER — Emergency Department (HOSPITAL_COMMUNITY)
Admission: EM | Admit: 2016-04-22 | Discharge: 2016-04-23 | Disposition: A | Discharge status: Home / Self Care | Payer: Medicaid Other | Attending: Emergency Medicine | Admitting: Emergency Medicine

## 2016-04-22 DIAGNOSIS — R112 Nausea with vomiting, unspecified: Secondary | ICD-10-CM | POA: Diagnosis present

## 2016-04-22 DIAGNOSIS — Z7982 Long term (current) use of aspirin: Secondary | ICD-10-CM | POA: Diagnosis not present

## 2016-04-22 DIAGNOSIS — F17210 Nicotine dependence, cigarettes, uncomplicated: Secondary | ICD-10-CM | POA: Diagnosis not present

## 2016-04-22 DIAGNOSIS — K529 Noninfective gastroenteritis and colitis, unspecified: Secondary | ICD-10-CM

## 2016-04-22 MED ORDER — MORPHINE SULFATE (PF) 4 MG/ML IV SOLN
4.0000 mg | Freq: Once | INTRAVENOUS | Status: AC
  Administered 2016-04-22: 4 mg via INTRAVENOUS
  Filled 2016-04-22: qty 1

## 2016-04-22 MED ORDER — ONDANSETRON HCL 4 MG/2ML IJ SOLN
4.0000 mg | Freq: Once | INTRAMUSCULAR | Status: AC
  Administered 2016-04-22: 4 mg via INTRAVENOUS
  Filled 2016-04-22: qty 2

## 2016-04-22 MED ORDER — SODIUM CHLORIDE 0.9 % IV BOLUS (SEPSIS)
1000.0000 mL | Freq: Once | INTRAVENOUS | Status: AC
  Administered 2016-04-22: 1000 mL via INTRAVENOUS

## 2016-04-22 MED ORDER — ONDANSETRON 4 MG PO TBDP: 4.0000 mg | ORAL_TABLET | Freq: Once | ORAL | Status: DC

## 2016-04-22 NOTE — ED Provider Notes (Signed)
[facility]-EMERGENCY DEPT Provider Note   CSN: [id number] Arrival date & time: 04/22/16  2201   By signing my name below, I, Aaron Reyes, attest that this documentation has been prepared under the direction and in the presence of Aaron SproutWhitney Taiven Greenley, MD . Electronically Signed: Freida Busmaniana Reyes, Scribe. 04/22/2016. 10:43 PM.   History   Chief Complaint Chief Complaint  Patient presents with  . Emesis    The history is provided by the patient. A language interpreter was used (Arabic).     HPI Comments:  Aaron Reyes is a 40 y.o. male brought in by ambulance, who presents to the Emergency Department complaining of perisistent nausea and vomiting that began ~ 1 hour ago.  He reports associated constant abdominal pain which preceded the nausea and vomiting. He also reports diarrhea. Pt's children with similar symptoms. No recent travel or known bad food exposure. No alleviating factors noted.  NKDA  Past Medical History:  Diagnosis Date  . GERD (gastroesophageal reflux disease)   . Kidney stone   . Non-cardiac chest pain   . S/P cardiac catheterization 03/2015   cath in [place] and [name], no records,  [facility] normal coronary arteries and normal LV function.    Patient Active Problem List   Diagnosis Date Noted  . Generalized anxiety disorder 01/26/2016  . Post traumatic stress disorder 01/26/2016  . 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 Back pain 04/26/2015  . Adjustment disorder with anxious mood 04/26/2015  . 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 Dysuria 04/26/2015  . Insect bite 04/19/2015  . Refugee health examination 03/18/2015  . Housing problems 03/18/2015  . Testicular mass 03/18/2015  . Inguinal hernia 03/18/2015  . Chest pain 02/15/2015  . Hypokalemia 02/15/2015    Past Surgical History:  Procedure Laterality Date  . CARDIAC CATHETERIZATION    . CARDIAC CATHETERIZATION     x 2  . CARDIAC CATHETERIZATION N/A 04/11/2015   Procedure: Left Heart Cath  and Coronary Angiography;  Surgeon: Lyn RecordsHenry W Smith, MD;  [facility];  Service: Cardiovascular;  Laterality: N/A;  . HERNIA REPAIR         Home Medications    Prior to Admission medications   Medication Sig Start Date End Date Taking? Authorizing Provider  aspirin EC 81 MG tablet Take 1 tablet (81 mg total) by mouth daily. 04/28/15   Leone BrandLaura R Ingold, NP  citalopram (CELEXA) 10 MG tablet Take 1 tablet (10 mg total) by mouth daily. 01/26/16   Ashly Hulen SkainsM Gottschalk, DO  diazepam (VALIUM) 2 MG tablet Take 1 tablet (2 mg total) by mouth every 4 (four) hours as needed for muscle spasms. 03/23/16   Lorre NickAnthony Allen, MD  nitroGLYCERIN (NITROSTAT) 0.4 MG SL tablet Place 1 tablet (0.4 mg total) under the tongue every 5 (five) minutes as needed for chest pain. 04/06/15   Leone BrandLaura R Ingold, NP  oxyCODONE-acetaminophen (PERCOCET/ROXICET) 5-325 MG tablet Take 2 tablets by mouth every 4 (four) hours as needed for severe pain. 03/23/16   Lorre NickAnthony Allen, MD  pantoprazole (PROTONIX) 40 MG tablet Take 1 tablet (40 mg total) by mouth daily. 11/20/15   Charlynne Panderavid Hsienta Yao, MD  trimethoprim-polymyxin b (POLYTRIM) ophthalmic solution Place 1 drop into both eyes every 6 (six) hours. x7 days 01/23/16   Raliegh IpAshly M Gottschalk, DO    Family History Family History  Problem Relation Age of Onset  . CAD Father   . Heart attack Father   . Hypertension Father   .  Stroke Father   . Cancer Mother   . Diabetes Mother   . Hypertension Mother     Social History Social History  Substance Use Topics  . Smoking status: Current Every Day Smoker    Packs/day: 0.25    Types: Cigarettes  . Smokeless tobacco: Never Used  . Alcohol use No     Allergies   Patient has no known allergies.   Review of Systems Review of Systems  Constitutional: Negative for fever.  Gastrointestinal: Positive for abdominal pain, diarrhea, nausea and vomiting.  All other systems reviewed and are negative.  Physical Exam Updated Vital  Signs BP 103/84   Pulse 112   Temp 97.4 F (36.3 C) (Oral)   Resp 20   SpO2 97%   Physical Exam  Constitutional: He is oriented to person, place, and time. He appears well-developed and well-nourished. He appears distressed.  Appears uncomfortable.  Repetitive vomiting and diarrhea  HENT:  Head: Normocephalic and atraumatic.  Right Ear: Tympanic membrane normal.  Left Ear: Tympanic membrane normal.  Mouth/Throat: Oropharynx is clear and moist.  Eyes: Conjunctivae and EOM are normal. Pupils are equal, round, and reactive to light.  Neck: Normal range of motion. Neck supple.  Cardiovascular: Normal rate, regular rhythm, normal heart sounds and intact distal pulses.   No murmur heard. Pulmonary/Chest: Effort normal and breath sounds normal. No respiratory distress. He has no wheezes. He has no rales.  Abdominal: Soft. He exhibits no distension. There is tenderness in the epigastric area. There is no rebound and no guarding.  Musculoskeletal: Normal range of motion. He exhibits no edema or tenderness.  Neurological: He is alert and oriented to person, place, and time.  Skin: Skin is warm. No rash noted. He is diaphoretic. No erythema.  Psychiatric: He has a normal mood and affect. His behavior is normal. Judgment normal.  Nursing note and vitals reviewed.    ED Treatments / Results  DIAGNOSTIC STUDIES:  Oxygen Saturation is 97% on RA, normal by my interpretation.    COORDINATION OF CARE:  10:38 PM Discussed treatment plan with pt at bedside and pt agreed to plan.  Labs (all labs ordered are listed, but only abnormal results are displayed) Labs Reviewed - No data to display  EKG  EKG Interpretation None       Radiology No results found.  Procedures Procedures (including critical care time)  Medications Ordered in ED Medications  ondansetron (ZOFRAN-ODT) disintegrating tablet 4 mg (not administered)  sodium chloride 0.9 % bolus 1,000 mL (not administered)   ondansetron (ZOFRAN) injection 4 mg (not administered)     Initial Impression / Assessment and Plan / ED Course  I have reviewed the triage vital signs and the nursing notes.  Pertinent labs & imaging results that were available during my care of the patient were reviewed by me and considered in my medical decision making (see chart for details).  Clinical Course    Pt with symptoms most consistent with a viral process with fever/vomitting/diarrhea with 3 other family members who all developed sx within of each other.  Denies bad food exposure and recent travel out of the country.  No recent abx.  No hx concerning for GU pathology or kidney stones.  Pt is awake and alert on exam without peritoneal signs. After fluids and zofran pt feeling much better and requesting to go home.  Tolerating po's.    Final Clinical Impressions(s) / ED Diagnoses   Final diagnoses:  Gastroenteritis  New Prescriptions New Prescriptions   ONDANSETRON (ZOFRAN ODT) 4 MG DISINTEGRATING TABLET    Take 1 tablet (4 mg total) by mouth every 8 (eight) hours as needed for nausea or vomiting.   I personally performed the services described in this documentation, which was scribed in my presence.  The recorded information has been reviewed and considered.     Aaron Sprout, MD 04/23/16 [phone]

## 2016-04-22 NOTE — ED Notes (Signed)
Interpreter used via [employer] interpreter

## 2016-04-22 NOTE — ED Triage Notes (Signed)
Pt arrives via EMS from home with c/o vomiting. Reports eating grapes and onset of n/v around 7 pm

## 2016-04-22 NOTE — ED Notes (Signed)
Via interpreter, pt states he is feeling better, but still having pain in his legs and he is cold. Warm blanket given.

## 2016-04-23 MED ORDER — ONDANSETRON 4 MG PO TBDP: 4.0000 mg | ORAL_TABLET | Freq: Three times a day (TID) | ORAL | 0 refills | Status: DC | PRN

## 2016-04-23 NOTE — ED Notes (Signed)
Tolerated fluids without difficulty, ambulated halls. EDP notified of the same

## 2016-04-23 NOTE — ED Notes (Signed)
Pt given sprite 

## 2016-05-08 ENCOUNTER — Encounter: Payer: Self-pay | Admitting: Family Medicine

## 2016-05-08 NOTE — Progress Notes (Signed)
Mr. Haze JustinHjazi was present in clinic today accompanying a friend to his appointment. [facility] and asked to schedule a followup appointment. This appointment was made for December 12th at 4pm.

## 2016-05-09 ENCOUNTER — Ambulatory Visit (INDEPENDENT_AMBULATORY_CARE_PROVIDER_SITE_OTHER): Payer: Medicaid Other | Admitting: *Deleted

## 2016-05-09 DIAGNOSIS — Z23 Encounter for immunization: Secondary | ICD-10-CM | POA: Diagnosis not present

## 2016-05-09 NOTE — Progress Notes (Signed)
   Lennox PippinsIbrahim A Achille presents for immunizations.    Screening questions for immunizations: 1. Are you sick today?  no 2. Do you have allergies to medications, foods, or any vaccines?  no 3. Have you ever had a serious reaction after receiving a vaccination?  NO 4. Do you have a long-term health problem with heart disease, asthma, lung disease, kidney disease, metabolic disease (e.g. diabetes), anemia, or other blood disorder?  no 5. Have you had a seizure, brain problem, or other nervous system problem?  no 6. Do you have cancer, leukemia, AIDS, or any other immune system problem?  no 7. Do you take cortisone, prednisone, other steroids, anticancer drugs or have you had radiation treatments?  no 8. Have you received a transfusion of blood or blood products, or been given immune (gamma) globulin or an antiviral drug in the past year?  no 9. Have you received vaccinations in the past 4 weeks?  no 10. FEMALES ONLY: Are you pregnant or is there a chance you could become pregnant during the next month?  no   Clovis PuMartin, Raylin Winer L, RN

## 2016-05-29 ENCOUNTER — Ambulatory Visit: Payer: Medicaid Other

## 2016-09-04 ENCOUNTER — Ambulatory Visit (INDEPENDENT_AMBULATORY_CARE_PROVIDER_SITE_OTHER): Payer: Medicaid Other | Admitting: Family Medicine

## 2016-09-04 ENCOUNTER — Encounter: Payer: Self-pay | Admitting: Family Medicine

## 2016-09-04 VITALS — BP 120/88 | HR 74 | Temp 97.8°F | Ht 69.0 in | Wt 148.0 lb

## 2016-09-04 DIAGNOSIS — Z789 Other specified health status: Secondary | ICD-10-CM | POA: Diagnosis not present

## 2016-09-04 DIAGNOSIS — M542 Cervicalgia: Secondary | ICD-10-CM | POA: Diagnosis not present

## 2016-09-04 DIAGNOSIS — K219 Gastro-esophageal reflux disease without esophagitis: Secondary | ICD-10-CM | POA: Diagnosis present

## 2016-09-04 DIAGNOSIS — Z603 Acculturation difficulty: Secondary | ICD-10-CM

## 2016-09-04 MED ORDER — PANTOPRAZOLE SODIUM 20 MG PO TBEC: 20.0000 mg | DELAYED_RELEASE_TABLET | Freq: Every day | ORAL | 3 refills | Status: DC

## 2016-09-04 NOTE — Progress Notes (Signed)
   Subjective: CC: acid reflux GNF:[id number]:Aaron Reyes is a 41 y.o. male presenting to clinic today for same day appointment. PCP: Delynn FlavinAshly Letasha Kershaw, DO Concerns today include:  [employer] video interpreter [place] [phone] used for Arabic translation of this visit  1. Acid reflux Patient reports that he has been experiencing burning sensation that starts in the stomach and extends to throat.  He reports that this has been going on for about 1 month.  He reports that it bothers him a lot.  Denies nausea, vomiting, hematochezia.  He was using Protonix previously with good improvement and would like this refilled.  2. Letter for work Patient has been out of work for cervicalgia since 06/2016.  He notes he was evaluated by orthopedics and was cleared.  He reports that he is no longer having neck pain.  Denies UE weakness, numbness/ tingling.  No Known Allergies  Social Hx reviewed. MedHx, current medications and allergies reviewed.  Please see EMR. ROS: Per HPI  Objective: Office vital signs reviewed. BP 120/88   Pulse 74   Temp 97.8 F (36.6 C) (Oral)   Ht 5\' 9"  (1.753 m)   Wt 148 lb (67.1 kg)   SpO2 98%   BMI 21.86 kg/m   Physical Examination:  General: Awake, alert, well nourished, well appearing male, No acute distress HEENT: Normal    Throat: moist mucus membranes, mild o/p erythema, no tonsillar exudate.  Airway is patent Cardio: regular rate and rhythm, S1S2 heard, no murmurs appreciated Pulm: normal work of breathing on room air GI: soft, non-tender, non-distended, bowel sounds present x4, no masses MSK: full painless AROM of c-spine. No midline or paraspinal TTP. Negative spurlings.  Neuro: 5/5 UE Strength and light touch sensation grossly intact  Assessment/ Plan: 41 y.o. male   1. Gastroesophageal reflux disease with esophagitis.  Has been doing well on Protonix.  No acute findings on abdominal exam.  Nothing to suggest ulcer at this tim. - Will consider testing for H  Pylori if persistent symptoms - pantoprazole (PROTONIX) 20 MG tablet; Take 1 tablet (20 mg total) by mouth daily.  Dispense: 30 tablet; Refill: 3 - Return precautions reviewed  2. Cervicalgia, Resolved.  Normal exam. No neurologic deficits or red flag signs. - Letter releasing to work provided  3. Language barrier affecting health care - [employer] video interpreter as above  Follow up prn.  Discuss flu vaccine again at next appt.  Raliegh IpAshly M Aja Bolander, DO PGY-3, [facility]

## 2016-09-05 ENCOUNTER — Encounter: Payer: Medicaid Other | Admitting: Family Medicine

## 2016-10-18 ENCOUNTER — Telehealth: Payer: Self-pay | Admitting: Pediatric Intensive Care

## 2016-10-19 ENCOUNTER — Ambulatory Visit (INDEPENDENT_AMBULATORY_CARE_PROVIDER_SITE_OTHER): Payer: Medicaid Other | Admitting: Family Medicine

## 2016-10-19 DIAGNOSIS — M542 Cervicalgia: Secondary | ICD-10-CM | POA: Insufficient documentation

## 2016-10-19 HISTORY — DX: Cervicalgia: M54.2

## 2016-10-19 NOTE — Progress Notes (Signed)
   Subjective:   Aaron Reyes is a 41 y.o. male with a history of GERD, adjustment disorder, PTSD here for same day appointment for No chief complaint on file.  History is obtained using [employer] video Arabic interpreter Johnny BridgeMartha [phone]  Patient with previous on-the-job injury of his neck and back in 06/2016. He was previously seen by occupational health. There is a note from 06/26/16 saying that his workers comp case is closed and he was referred to an orthopedist. He was never seen by an orthopedist. He reports that he has been out of work for the last 4 months and would like to return to work. He denies any neck pain, weakness or numbness of his upper extremity or decreased function.  Review of Systems:  Per HPI.   Social History: Current smoker  Objective:  BP 110/70   Pulse 92   Temp 98.1 F (36.7 C) (Oral)   Wt 149 lb (67.6 kg)   SpO2 98%   BMI 22.00 kg/m   Gen:  41 y.o. male in NAD HEENT: NCAT, MMM, anicteric sclerae CV: RRR, no MRG Resp: Non-labored, CTAB, no wheezes noted Ext: WWP, no edema MSK: Neck: Full range of motion in all planes, no tenderness to palpation over spinous processes or musculature. Full range of motion of upper extremities. Strength intact in all muscle groups of upper extremities, sensation intact in upper extremities to light touch Neuro: Alert and oriented, speech normal      Assessment & Plan:     Aaron Reyes is a 41 y.o. male here for   Cervicalgia Seems to be resolved with no abnormalities on exam Seems that Worker's Comp. case was closed Work note was given for patient today to return to full duty at work Return precautions were discussed   Erasmo DownerAngela M Merryl Buckels, MD MPH PGY-3,  [facility] 10/19/2016  12:09 PM

## 2016-10-19 NOTE — Assessment & Plan Note (Signed)
Seems to be resolved with no abnormalities on exam Seems that Worker's Comp. case was closed Work note was given for patient today to return to full duty at work Return precautions were discussed

## 2016-11-08 ENCOUNTER — Encounter: Payer: Self-pay | Admitting: Pediatric Intensive Care

## 2016-11-08 NOTE — Telephone Encounter (Signed)
Client will notify FAI office when he receives letter regarding hearing aid.

## 2016-11-08 NOTE — Congregational Nurse Program (Signed)
Congregational Nurse Program Note  Date of Encounter: 11/08/2016  Past Medical History: Past Medical History:  Diagnosis Date  . GERD (gastroesophageal reflux disease)   . Kidney stone   . Non-cardiac chest pain   . S/P cardiac catheterization 03/2015   cath in [place] and [name], no records,  [facility] arteries and normal LV function.    Encounter Details:     CNP Questionnaire - 10/16/16 1615      Patient Demographics   Is this a new or existing patient? New   Patient is considered a/an Refugee   Race Other     Patient Assistance   Location of Patient Assistance [employer]   Patient's financial/insurance status Low Income;[facility]   Uninsured Patient [facility]Care Connects) Yes   Interventions Not Applicable   Patient referred to apply for the following financial assistance Not Applicable   Food insecurities addressed Not Applicable   Transportation assistance No   Assistance securing medications No   Educational health offerings Navigating the healthcare system     Encounter Details   Primary purpose of visit Education/Health Concerns;Navigating the Healthcare System   Was an Emergency Department visit averted? Not Applicable   Does patient have a medical provider? Yes   Patient referred to Follow up with established PCP   Was a mental health screening completed? (GAINS tool) No   Does patient have dental issues? No   Does patient have vision issues? No   Does your patient have an abnormal blood pressure today? No   Since previous encounter, have you referred patient for abnormal blood pressure that resulted in a new diagnosis or medication change? No   Does your patient have an abnormal blood glucose today? No   Since previous encounter, have you referred patient for abnormal blood glucose that resulted in a new diagnosis or medication change? No   Was there a life-saving intervention made? No         Clinical Intake -  10/19/16 1138      Pre-visit preparation   Pre-visit preparation completed Yes     Abuse/Neglect   Unable to ask? Yes     Patient Literacy   How often do you need to have someone help you when you read instructions, pamphlets, or other written materials from your doctor or pharmacy? 4 - Often     Web designerLanguage Assistant   Interpreter Needed? Yes    Client here with friend Gretta ArabMustapha as interpreter. Client states that he needs a hearing aid. CN reviewed chart and it appears that he has been evaluated for hearing aid. Client will need to submit application for hearing aid with

## 2016-11-20 ENCOUNTER — Encounter: Payer: Self-pay | Admitting: Family Medicine

## 2016-11-20 ENCOUNTER — Ambulatory Visit (INDEPENDENT_AMBULATORY_CARE_PROVIDER_SITE_OTHER): Payer: Medicaid Other | Admitting: Family Medicine

## 2016-11-20 VITALS — BP 124/74 | HR 62 | Temp 97.8°F | Resp 16 | Ht 69.0 in | Wt 149.0 lb

## 2016-11-20 DIAGNOSIS — F331 Major depressive disorder, recurrent, moderate: Secondary | ICD-10-CM | POA: Diagnosis not present

## 2016-11-20 DIAGNOSIS — F4310 Post-traumatic stress disorder, unspecified: Secondary | ICD-10-CM | POA: Diagnosis not present

## 2016-11-20 MED ORDER — ESCITALOPRAM OXALATE 20 MG PO TABS: 20.0000 mg | ORAL_TABLET | Freq: Every day | ORAL | 0 refills | Status: DC

## 2016-11-20 MED ORDER — BUSPIRONE HCL 15 MG PO TABS: 15.0000 mg | ORAL_TABLET | Freq: Three times a day (TID) | ORAL | 1 refills | Status: DC

## 2016-11-20 MED ORDER — TRAZODONE HCL 100 MG PO TABS: 100.0000 mg | ORAL_TABLET | Freq: Every day | ORAL | 0 refills | Status: DC

## 2016-11-20 NOTE — Patient Instructions (Addendum)
For anxiety, I am starting you Buspar 15 mg, three times daily for anxiety.  For both depression and anxiety, take Escitalopram 20 mg at bedtime every night.  Medication for sleep is Trazodone 100 mg at bedtime every night 1 hour before bedtime.   Monday June 25 th at 2:00 pm next appointment.        Buspar 15      .        Escitalopram 20      .     Trazodone 100       1   .   25    2:00    .  lilqalaq , 'ana 'abda bik Buspar 15 maligh , thlath marrat yawmiaan lilqalq. likuli min alaiktiaab walqalaq , khudh Escitalopram 20 milgh fi waqt alnawm kl laylat. aldawa' lilnuwm hu Trazodone 100 malgh fi waqt alnawm kli laylat 1 saeat qabl alnuwmi. alaithnayn 25 yuniu fi alssaeat 2:00 baed alzuhr almaweid altaali.

## 2016-11-20 NOTE — Progress Notes (Signed)
Patient ID: Aaron Reyes, male    DOB: 01-Jul-1975, 41 y.o.   MRN: [id number]  PCP: Bing Neighbors, FNP  Chief Complaint  Patient presents with  . Establish Care  . MEDICATION    MENTAL STATE  . Fatigue    Subjective:  HPI Aaron Reyes is a 41 y.o. male presents to establish care and mental health medication management.  He was referred today by his psychiatrist although he is uncertain of their name or location. He sees psychiatrist every Friday for counseling services only.  Reports that he immigrated to the [place] over 1 year ago from [place] with his wife and children to  escape the war. Since being in the [place] his young son was viscously attacked by a dog 12 months ago causing  Irreversible damage to his face. He was hospitalized for several months at [facility] in [place]. He reports that he unable to function as he is so distressed. His wife and children depend completely on him and he is unable to sleep, lives in constant fear, if he sleep he experiences nightmares. He is not working right now as he can't as his family is depending completely on him. Wife and children are in therapy without him he reports. Denies thoughts of suicide or homicide as he is forbidden according to religion, he practices Islam, to commit suicide and or homicide although he endorses that "I do pray that God would take my life". He has been on some medications previously, requests something to help him rest and remain calm.  Social History   Social History  . Marital status: Married    Spouse name: N/A  . Number of children: N/A  . Years of education: N/A   Occupational History  . Not on file.   Social History Main Topics  . Smoking status: Current Every Day Smoker    Packs/day: 0.25    Types: Cigarettes  . Smokeless tobacco: Never Used  . Alcohol use No  . Drug use: No  . Sexual activity: Not on file   Other Topics Concern  . Not on file    Social History Narrative   ** Merged History Encounter **        Family History  Problem Relation Age of Onset  . CAD Father   . Heart attack Father   . Hypertension Father   . Stroke Father   . Cancer Mother   . Diabetes Mother   . Hypertension Mother    Review of Systems See HPI Patient Active Problem List   Diagnosis Date Noted  . Cervicalgia 10/19/2016  . GERD (gastroesophageal reflux disease) 09/04/2016  . Generalized anxiety disorder 01/26/2016  . Post traumatic stress disorder 01/26/2016  . 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 Back pain 04/26/2015  . Adjustment disorder with anxious mood 04/26/2015  . 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 Dysuria 04/26/2015  . Insect bite 04/19/2015  . Refugee health examination 03/18/2015  . Housing problems 03/18/2015  . Testicular mass 03/18/2015  . Inguinal hernia 03/18/2015  . Chest pain 02/15/2015  . Hypokalemia 02/15/2015    No Known Allergies  Prior to Admission medications   Medication Sig Start Date End Date Taking? Authorizing Provider  aspirin EC 81 MG tablet Take 1 tablet (81 mg total) by mouth daily. 04/28/15  Yes Leone Brand, NP  citalopram (CELEXA) 10 MG tablet Take 1 tablet (10 mg total) by mouth daily. 01/26/16  Yes Gottschalk,  Ashly M, DO  diazepam (VALIUM) 2 MG tablet Take 1 tablet (2 mg total) by mouth every 4 (four) hours as needed for muscle spasms. 03/23/16  Yes Lorre NickAllen, Anthony, MD  nitroGLYCERIN (NITROSTAT) 0.4 MG SL tablet Place 1 tablet (0.4 mg total) under the tongue every 5 (five) minutes as needed for chest pain. 04/06/15  Yes Leone BrandIngold, Laura R, NP  pantoprazole (PROTONIX) 20 MG tablet Take 1 tablet (20 mg total) by mouth daily. 09/04/16  Yes Raliegh IpGottschalk, Ashly M, DO    Past Medical, Surgical Family and Social History reviewed and updated.    Objective:   Depression screen [facility] 2/9 11/20/2016 09/04/2016 04/26/2015 04/19/2015 03/18/2015  Decreased Interest 0 0 0 0 0  Down, Depressed,  Hopeless 0 0 0 0 0  PHQ - 2 Score 0 0 0 0 0    Today's Vitals   11/20/16 1426  BP: 124/74  Pulse: 62  Resp: 16  Temp: 97.8 F (36.6 C)  TempSrc: Oral  SpO2: 99%  Weight: 149 lb (67.6 kg)  Height: 5\' 9"  (1.753 m)    Wt Readings from Last 3 Encounters:  11/20/16 149 lb (67.6 kg)  10/19/16 149 lb (67.6 kg)  09/04/16 148 lb (67.1 kg)   Physical Exam  Constitutional: He is oriented to person, place, and time. He appears well-developed and well-nourished.  Well-dressed and well appearing   HENT:  Head: Normocephalic and atraumatic.  Cardiovascular: Normal rate, regular rhythm, normal heart sounds and intact distal pulses.   Pulmonary/Chest: Effort normal and breath sounds normal.  Neurological: He is alert and oriented to person, place, and time.  Skin: Skin is warm and dry.  Psychiatric: Judgment normal. His mood appears anxious. His speech is rapid and/or pressured. He is agitated. Cognition and memory are normal. He exhibits a depressed mood.    Assessment and Plan:  1. PTSD (post-traumatic stress disorder) 2. Moderate episode of recurrent major depressive disorder (HCC)  Plan: Escitalopram 20 mg at bedtime every night. Buspar 15 mg, three times daily for anxiety. Trazodone 100 mg at bedtime every night 1 hour before bedtime.   RTC: 3 weeks around June 25th.    Godfrey PickKimberly S. Tiburcio PeaHarris, MSN, FNP-C [facility]  [address] [place], [place] [address] [phone]

## 2016-12-10 ENCOUNTER — Ambulatory Visit (INDEPENDENT_AMBULATORY_CARE_PROVIDER_SITE_OTHER): Payer: Medicaid Other | Admitting: Family Medicine

## 2016-12-10 ENCOUNTER — Encounter: Payer: Self-pay | Admitting: Family Medicine

## 2016-12-10 VITALS — BP 130/80 | HR 89 | Temp 98.1°F | Resp 14 | Ht 69.0 in

## 2016-12-10 DIAGNOSIS — F4310 Post-traumatic stress disorder, unspecified: Secondary | ICD-10-CM

## 2016-12-10 DIAGNOSIS — R5383 Other fatigue: Secondary | ICD-10-CM

## 2016-12-10 DIAGNOSIS — F329 Major depressive disorder, single episode, unspecified: Secondary | ICD-10-CM

## 2016-12-10 DIAGNOSIS — F419 Anxiety disorder, unspecified: Secondary | ICD-10-CM

## 2016-12-10 LAB — CBC WITH DIFFERENTIAL/PLATELET
BASOS ABS: 0 {cells}/uL (ref 0–200)
Basophils Relative: 0 %
EOS ABS: 132 {cells}/uL (ref 15–500)
Eosinophils Relative: 2 %
HCT: 47.7 % (ref 38.5–50.0)
HEMOGLOBIN: 16.3 g/dL (ref 13.2–17.1)
LYMPHS ABS: 2310 {cells}/uL (ref 850–3900)
Lymphocytes Relative: 35 %
MCH: 29.7 pg (ref 27.0–33.0)
MCHC: 34.2 g/dL (ref 32.0–36.0)
MCV: 87 fL (ref 80.0–100.0)
MONO ABS: 330 {cells}/uL (ref 200–950)
MONOS PCT: 5 %
MPV: 9.7 fL (ref 7.5–12.5)
NEUTROS ABS: 3828 {cells}/uL (ref 1500–7800)
Neutrophils Relative %: 58 %
PLATELETS: 266 10*3/uL (ref 140–400)
RBC: 5.48 MIL/uL (ref 4.20–5.80)
RDW: 13.1 % (ref 11.0–15.0)
WBC: 6.6 10*3/uL (ref 3.8–10.8)

## 2016-12-10 MED ORDER — BUSPIRONE HCL 15 MG PO TABS: 15.0000 mg | ORAL_TABLET | Freq: Three times a day (TID) | ORAL | 5 refills | Status: DC

## 2016-12-10 MED ORDER — TRAZODONE HCL 100 MG PO TABS: 200.0000 mg | ORAL_TABLET | Freq: Every day | ORAL | 1 refills | Status: DC

## 2016-12-10 MED ORDER — ESCITALOPRAM OXALATE 20 MG PO TABS: 20.0000 mg | ORAL_TABLET | Freq: Every day | ORAL | 5 refills | Status: DC

## 2016-12-10 NOTE — Patient Instructions (Addendum)
Trazodone 100   200           .           .               .  'ana 'azid min Trazodone 100 maligh 'iilaa 200 malgh litatamakan min tanawuliha fi waqt alnawm litahqiq nawm 'aemaq. aistamara fi tanawul jmye 'adwiatiha kama hu mawsuf fi al'asl. laqad qumt bi'iieadat mil' jmye al'adwiat alkhasat bik wasawf takun mutahatan liailtiqat fi alsaydalat.    I am increasing your Trazodone 100 mg to 200 mg for you to take at bedtime to achieve a deeper sleep.  Continue taking all of her medications as originally prescribed. I have refilled all of your medications and they will be available  to pick up at pharmacy.     Posttraumatic Stress Disorder Posttraumatic stress disorder (PTSD) is a mental health disorder that can occur after a traumatic event, such as a threat to life, serious injury, or sexual violence. Some people who experience these types of events may develop PTSD. Sometimes, PTSD can occur in people who hear about trauma that occurs to a close family member or friend. PTSD can happen to anyone at any age. What increases the risk? This condition is more likely to occur in:  Engineer, miningMilitary servicemen and servicewomen.  People who have been the victims of, or witness to, a traumatic event, such as: ? Domestic violence. ? Childhood physical or sexual abuse. ? Rape. ? Natural disasters. ? Accidents involving serious injury.  What are the signs or symptoms? Symptoms of PTSD can be grouped into several categories: intrusive, avoidance, increased arousal, and negative moods and thoughts. Intrusive Symptoms This is when a person re-experiences the traumatic event through one or more of the following ways:  Distressing dreams.  Feelings of fear, horror, intense sadness, or anger in response to a reminder of the trauma.  Unwanted  distressing memories while awake.  Physical reactions triggered by reminders of the trauma, such as increased heart rate, shortness of breath, sweating, and shaking.  Having flashbacks, or feelings like you are going through the event again.  Avoidance Symptoms This is when a person avoids thoughts, conversations, people, or activities that are reminders of the trauma. Symptoms may also include:  Decreased interest or participation in daily activities.  Loss of connection or avoidance of other people.  Increased Arousal Symptoms This is when a person is more sensitive or reacts more easily to their environment. Symptoms may include:  Being easily startled.  Careless or self-destructive behavior.  Irritability.  Feeling on edge.  Difficulty concentrating.  Verbal or physical outbursts of anger toward other people or objects.  Difficulty sleeping.  Negative Moods or Thoughts These may include:  Belief that oneself or others are bad.  Regular feelings of fear, horror, anger, sadness, guilt, or shame.  Not being able to remember certain parts of the traumatic event.  Blaming themselves or others for the trauma.  Inability to experience positive emotions, such as happiness or love.  PTSD symptoms may start soon after a frightening event or months or years later. Symptoms last at least one month and tend to disrupt relationships, work, and daily activities. How is this diagnosed? PTSD is diagnosed through an assessment by a mental health professional. Bonita QuinYou will be asked questions about any traumatic events. You will also be asked about how these events have changed your thoughts, mood, behavior, and ability to function on a daily basis. You  may also be asked if you use alcohol or drugs. How is this treated? Treatment for PTSD may include:  Medicines. Certain medicines can reduce some PTSD symptoms.  Counseling (cognitive behavioral therapy). Talk therapy with a mental  health professional who is experienced in treating PTSD can help.  Eye movement desensitization and reprocessing therapy (EMDR). This type of therapy occurs with a specialized therapist.  Many people with PTSD benefit from a combination of these treatments. If you have other mental health problems, such as depression, alcohol abuse, or drug addiction, your treatment plan will include treatment for these other conditions. Follow these instructions at home: Lifestyle  Find a support group in your community. Often groups are available for [employer] veterans, trauma victims, and family members or caregivers.  Find ways to relax. This may include: ? Breathing exercises. ? Meditation. ? Yoga. ? Listening to quiet music.  Exercise regularly. Try to do at least 30 minutes of physical activity most days of the week.  Try to get 7-9 hours of sleep each night. To help with sleep: ? Keep your bedroom cool and dark. ? Do not eat a heavy meal within one hour of bedtime. ? Do not drink alcohol or caffeinated drinks before bed. ? Avoid screen time, such as television, computers, tablets, or cell phones before bed.  Do not drink alcohol or take illegal drugs.  Look into volunteer opportunities. This can help you feel more connected to your community.  Take steps to help yourself feel safer at home, such as by installing a security system.  Contact a local organization to find out if you are eligible for a service dog.  Keep daily contact with at least one trusted friend or family member.  If your PTSD is affecting your marriage or family, seek help from a family therapist. General instructions  Take over-the-counter and prescription medicines only as told by your health care provider.  Keep all follow-up visits as told by your health care provider and counselor. This is important.  Make sure to let all of your health care providers know you have PTSD. This is especially important if you are  having surgery or need to be admitted to the hospital. Contact a health care provider if:  Your symptoms do not get better or get worse. Get help right away if:  You have thoughts of wanting to harm yourself or others. This information is not intended to replace advice given to you by your health care provider. Make sure you discuss any questions you have with your health care provider. Document Released: 02/27/2001 Document Revised: 09/26/2015 Document Reviewed: 05/20/2015 Elsevier Interactive Patient Education  [employer].

## 2016-12-10 NOTE — Progress Notes (Signed)
Patient ID: Aaron Reyes, male    DOB: [birth year], 41 y.o.   MRN: [id number]  PCP: Aaron Reyes  Chief Complaint  Patient presents with  . Follow-up    ANXIETY    Subjective:  HPI 140030 Aaron Reyes interpreter services Aaron Reyes is a 41 y.o. male presents for evaluation of PTSD, Depression and Anxiety follow-up. Aaron Reyes is a refugee from [name]. Please refer to prior office notes for additional history of patient's mental illness. Patient reports that he is no longer having trouble falling asleep however he reports that his wife tells him that he experiences  violent nightmares and hallucinations during his sleep. He was under the impression the prescribed medication would resolve nightmares and fearfulness. During his last office visit he was prescribed trazodone for sleep, BuSpar for anxiety, and escitalopram for depression and anxiety. The patient confirms that he is attending psychotherapy once weekly every 2 weeks however he did again forget to bring the card including the psychiatrist name and address. Today his wife has accompanied him at this visit. He is very thankful for the medications that he has received and reports that he is tolerating the medications. He reports that he continues to be extremely fatigued and tired. Denies any history of any type of thyroid abnormality. He denies any associated shortness of breath or chest pain. Social History   Social History  . Marital status: Married    Spouse name: N/A  . Number of children: N/A  . Years of education: N/A   Occupational History  . Not on file.   Social History Main Topics  . Smoking status: Current Every Day Smoker    Packs/day: 0.25    Types: Cigarettes  . Smokeless tobacco: Never Used  . Alcohol use No  . Drug use: No  . Sexual activity: Not on file   Other Topics Concern  . Not on file   Social History Narrative   ** Merged History Encounter **        Family History  Problem  Relation Age of Onset  . CAD Father   . Heart attack Father   . Hypertension Father   . Stroke Father   . Cancer Mother   . Diabetes Mother   . Hypertension Mother    Review of Systems See history of present illness  Patient Active Problem List   Diagnosis Date Noted  . Cervicalgia 10/19/2016  . GERD (gastroesophageal reflux disease) 09/04/2016  . Generalized anxiety disorder 01/26/2016  . Post traumatic stress disorder 01/26/2016  . 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 Back pain 04/26/2015  . Adjustment disorder with anxious mood 04/26/2015  . 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 Dysuria 04/26/2015  . Insect bite 04/19/2015  . Refugee health examination 03/18/2015  . Housing problems 03/18/2015  . Testicular mass 03/18/2015  . Inguinal hernia 03/18/2015  . Chest pain 02/15/2015  . Hypokalemia 02/15/2015    No Known Allergies  Prior to Admission medications   Medication Sig Start Date End Date Taking? Authorizing Provider  aspirin EC 81 MG tablet Take 1 tablet (81 mg total) by mouth daily. 04/28/15  Yes Leone BrandIngold, Laura R, NP  diazepam (VALIUM) 2 MG tablet Take 1 tablet (2 mg total) by mouth every 4 (four) hours as needed for muscle spasms. 03/23/16  Yes Lorre NickAllen, Anthony, MD  escitalopram (LEXAPRO) 20 MG tablet Take 1 tablet (20 mg total) by mouth at bedtime. 11/20/16  Yes Aaron Reyes  traZODone (  DESYREL) 100 MG tablet Take 1 tablet (100 mg total) by mouth at bedtime. 11/20/16  Yes Aaron Neighbors, Reyes  busPIRone (BUSPAR) 15 MG tablet Take 1 tablet (15 mg total) by mouth 3 (three) times daily. Patient not taking: Reported on 12/10/2016 11/20/16   Aaron Neighbors, Reyes  nitroGLYCERIN (NITROSTAT) 0.4 MG SL tablet Place 1 tablet (0.4 mg total) under the tongue every 5 (five) minutes as needed for chest pain. Patient not taking: Reported on 12/10/2016 04/06/15   Leone Brand, NP  pantoprazole (PROTONIX) 20 MG tablet Take 1 tablet (20 mg total) by mouth  daily. Patient not taking: Reported on 12/10/2016 09/04/16   Raliegh Ip, DO    Past Medical, Surgical Family and Social History reviewed and updated.    Objective:   Today's Vitals   12/10/16 1405  BP: 130/80  Pulse: 89  Resp: 14  Temp: 98.1 F (36.7 C)  TempSrc: Oral  SpO2: 99%  Height: 5\' 9"  (1.753 m)    Wt Readings from Last 3 Encounters:  11/20/16 149 lb (67.6 kg)  10/19/16 149 lb (67.6 kg)  09/04/16 148 lb (67.1 kg)   Physical Exam  Constitutional: He is oriented to person, place, and time. He appears well-developed.  HENT:  Head: Normocephalic and atraumatic.  Eyes: EOM are normal. Pupils are equal, round, and reactive to light. Right eye exhibits no discharge. Left eye exhibits no discharge.  Bilateral sclera reddened  Cardiovascular: Normal rate, regular rhythm, normal heart sounds and intact distal pulses.   Pulmonary/Chest: Effort normal and breath sounds normal.  Musculoskeletal: Normal range of motion.  Neurological: He is alert and oriented to person, place, and time.  Psychiatric: His speech is normal. Thought content normal. His mood appears anxious.  Patient shows visible signs of anxiousness however compared to previous visit he is significantly more controlled. His speech pattern today is at a normal pace, it is not rapid nor pressured as it was at the prior visit.     Assessment & Plan:  1. Fatigue, unspecified type 2. PTSD (post-traumatic stress disorder) 3. Anxiety and depression - Thyroid Panel With TSH - CBC with Differential Continue all medications as previously prescribed. Continue psychiatric visits with psychiatry.  Assisted patient in completing a medical records requests form, he request a copy of all medical records.  I also signed and dated a form verifying that he has had a follow-up visit with me today. He reports that the form is necessary for his social worker who is working with him and his family.  RTC: Follow-up in 2  months for evaluation of anxiety, depression, and PTSD.  Aaron Pick. Tiburcio Pea, MSN, Reyes-C [facility]  [address] [place], [place] [address] [phone]

## 2016-12-11 LAB — THYROID PANEL WITH TSH
Free Thyroxine Index: 2.1 (ref 1.4–3.8)
T3 Uptake: 29 % (ref 22–35)
T4, Total: 7.4 ug/dL (ref 4.5–12.0)
TSH: 1.82 mIU/L (ref 0.40–4.50)

## 2017-02-07 ENCOUNTER — Encounter: Payer: Self-pay | Admitting: Student

## 2017-02-07 DIAGNOSIS — H903 Sensorineural hearing loss, bilateral: Secondary | ICD-10-CM | POA: Insufficient documentation

## 2017-02-07 HISTORY — DX: Sensorineural hearing loss, bilateral: H90.3

## 2017-02-08 ENCOUNTER — Ambulatory Visit: Payer: Medicaid Other | Admitting: Student

## 2017-02-11 ENCOUNTER — Ambulatory Visit: Payer: Medicaid Other | Admitting: Family Medicine

## 2017-02-20 ENCOUNTER — Encounter: Payer: Self-pay | Admitting: Student

## 2017-02-20 ENCOUNTER — Ambulatory Visit (INDEPENDENT_AMBULATORY_CARE_PROVIDER_SITE_OTHER): Payer: Medicaid Other | Admitting: Student

## 2017-02-20 ENCOUNTER — Ambulatory Visit (HOSPITAL_COMMUNITY)
Admission: RE | Admit: 2017-02-20 | Discharge: 2017-02-20 | Disposition: A | Discharge status: Home / Self Care | Payer: Medicaid Other | Source: Ambulatory Visit | Attending: Family Medicine | Admitting: Family Medicine

## 2017-02-20 VITALS — BP 110/78 | HR 77 | Temp 98.1°F | Ht 69.0 in | Wt 151.6 lb

## 2017-02-20 DIAGNOSIS — K219 Gastro-esophageal reflux disease without esophagitis: Secondary | ICD-10-CM | POA: Insufficient documentation

## 2017-02-20 DIAGNOSIS — F17210 Nicotine dependence, cigarettes, uncomplicated: Secondary | ICD-10-CM | POA: Insufficient documentation

## 2017-02-20 DIAGNOSIS — M25561 Pain in right knee: Secondary | ICD-10-CM

## 2017-02-20 DIAGNOSIS — R079 Chest pain, unspecified: Secondary | ICD-10-CM

## 2017-02-20 DIAGNOSIS — T733XXA Exhaustion due to excessive exertion, initial encounter: Secondary | ICD-10-CM | POA: Diagnosis not present

## 2017-02-20 DIAGNOSIS — M2550 Pain in unspecified joint: Secondary | ICD-10-CM | POA: Insufficient documentation

## 2017-02-20 DIAGNOSIS — M25562 Pain in left knee: Secondary | ICD-10-CM

## 2017-02-20 DIAGNOSIS — R5383 Other fatigue: Secondary | ICD-10-CM | POA: Diagnosis not present

## 2017-02-20 MED ORDER — PANTOPRAZOLE SODIUM 40 MG PO TBEC: 40.0000 mg | DELAYED_RELEASE_TABLET | Freq: Every day | ORAL | 0 refills | Status: DC

## 2017-02-20 NOTE — Patient Instructions (Signed)
It was great seeing you today! We have addressed the following issues today 1. Chest pain: this is unlikely to come from you heard best of the testis we have done. I sent a prescription for acid reflux to your pharmacy. Please take this prescription every day in the morning. I also recommend taking your citalopram as prescribed. Please come back and see us in 2 weeks. Please bring all your medications to that visit. 2.  Leg pain: doing some blood tests today. Meanwhile, I recommend trying over-the-counter Tylenol as needed for pain.   If we did any lab work today, and the results require attention, either me or my nurse will get in touch with you. If everything is normal, you will get a letter in mail and a message via . If you don't hear from us in two weeks, please give us a call. Otherwise, we look forward to seeing you again at your next visit. If you have any questions or concerns before then, please call the clinic at [phone].  Please bring all your medications to every doctors visit  Sign up for My Chart to have easy access to your labs results, and communication with your Primary care physician.    Please check-out at the front desk before leaving the clinic.    Take Care,   Dr. Alanda SlimGonfa

## 2017-02-20 NOTE — Congregational Nurse Program (Signed)
Congregational Nurse Program Note  Date of Encounter: 02/20/2017  Past Medical History: Past Medical History:  Diagnosis Date  . GERD (gastroesophageal reflux disease)   . Kidney stone   . Non-cardiac chest pain   . S/P cardiac catheterization 03/2015   cath in [place] and [name], no records,  [facility] and normal LV function.    Encounter Details:     CNP Questionnaire - 02/20/17 0945      Patient Demographics   Is this a new or existing patient? Existing   Patient is considered a/an Refugee   Race Asian     Patient Assistance   Location of Patient Assistance Not Applicable   Patient's financial/insurance status Medicaid;Low Income   Uninsured Patient (Orange Research officer, trade unionCard/Care Connects) No   Patient referred to apply for the following financial assistance Not Applicable   Food insecurities addressed Not Applicable   Transportation assistance No   Assistance securing medications No   Educational Programmer, systemshealth offerings Navigating the healthcare system     Encounter Details   Primary purpose of visit Navigating the Healthcare System   Was an Emergency Department visit averted? Not Applicable   Does patient have a medical provider? Yes   Patient referred to Not Applicable   Was a mental health screening completed? (GAINS tool) No   Does patient have dental issues? No   Does patient have vision issues? No   Does your patient have an abnormal blood pressure today? No   Since previous encounter, have you referred patient for abnormal blood pressure that resulted in a new diagnosis or medication change? No   Does your patient have an abnormal blood glucose today? No   Since previous encounter, have you referred patient for abnormal blood glucose that resulted in a new diagnosis or medication change? No   Was there a life-saving intervention made? No     Brief office visit at [employer] to request assistance with directions to [facility] today at 10:50am.  Overall appearance very good and happy. Recently returned for English classes at [employer]. Describes family "very good and attending therapy". Instructions regarding appointment provided through Arabic phone interpreter. Access to own transportation. Return to nurse office prn. Ferol LuzMarietta Yaret Hush, RN/CN

## 2017-02-20 NOTE — Progress Notes (Signed)
Subjective:    Aaron Reyes is a 41 y.o. old male here chest pain  HPI Chest pain: for one month. Started as mild and starting getting worse. Chest pain is bilateral especially in the middle. He describes the pain as pressure like. Reports similar pain in his shoulders and his back. Also feels like a kind of chocking. Also reports heart burn. He says the prescription he was given didn't help his acid reflux and had to take over the counter medicine. He doesn't remember the name of OTC meds.  Lives with wife and 5 kids. Not working right now. Has been out of work for 7 months.  Reports feeling fatigued. He was using a Conservation officer, nature two days ago and he felt the chest pain that resolved after rest.   Leg pain: both knees to his foot. This has been going on for 15 days. Thinks this is related to his chest pain. Denies history of injury or trauma. Denies swelling in his legs, fever or chills.   Father with CAD at 39 years of age.   Reports smoking heavily in the past. 1 pack a day for 16 years. Cut down to 3 cig a day for the last one year and half.  Denies drinking or recreational drug use.  PMH/Problem List: has Chest pain; Hypokalemia; Refugee health examination; Housing problems; Testicular mass; Inguinal hernia; Back pain; Adjustment disorder with anxious mood; Tinnitus of both ears; Dysuria; Positive QuantiFERON-TB Gold test; Generalized anxiety disorder; Post traumatic stress disorder; GERD (gastroesophageal reflux disease); Cervicalgia; Bilateral sensorineural hearing loss; Arthralgia of both lower legs; and Fatigue due to excessive exertion on his problem list.   has a past medical history of GERD (gastroesophageal reflux disease); Kidney stone; Non-cardiac chest pain; and S/P cardiac catheterization (03/2015).  FH:  Family History  Problem Relation Age of Onset  . CAD Father   . Heart attack Father   . Hypertension Father   . Stroke Father   . Cancer Mother   . Diabetes Mother   .  Hypertension Mother     [facility] Social History  Substance Use Topics  . Smoking status: Current Every Day Smoker    Packs/day: 0.25    Types: Cigarettes  . Smokeless tobacco: Never Used  . Alcohol use No    Review of Systems Review of systems negative except for pertinent positives and negatives in history of present illness above.     Objective:     Vitals:   02/20/17 1104  BP: 110/78  Pulse: 77  Temp: 98.1 F (36.7 C)  TempSrc: Oral  SpO2: 98%  Weight: 151 lb 9.6 oz (68.8 kg)  Height: 5' 9"  (1.753 m)   Body mass index is 22.39 kg/m.  Physical Exam GEN: appears anxious Head: normocephalic and atraumatic  Eyes: conjunctiva without injection, sclera anicteric HEM: negative for cervical or periauricular lymphadenopathies ENDO: negative thyromegally CVS: RRR, nl S1&S2, no murmurs, no edema RESP: no IWOB, good air movement bilaterally, CTAB GI: BS present & normal, soft, NTND GU: no suprapubic or CVA tenderness MSK: no focal tenderness or notable swelling in his knees or legs. Neurovascularly intact.  SKIN: no apparent skin lesion NEURO: alert and oiented appropriately, no gross deficits  PSYCH: anxious    Assessment and Plan:  1. Chest pain, unspecified type: He is chest pain free now. Chest pain is likely anxiety driven. He appears very anxious. Not able administer GAD-7 and PHQ-9 due to language barrier. He has multiple psychosocial stressors. He has history of  GAD, PTSD, immigrant, language barrier, unemployed and head of the house (5 children and wife). Not sure if he is taking his medications as prescribed. He is also on Valium. Not sure if he sees psychiatrist or not. He says he sees a lot of doctors. He was last seen in IM clinic about three months ago. Other possibilities are GERD given history of this, cardiac etiology but unlikely. He has history LHC in 2016 with EF of 45-54% but no structural abnormality or CAD. He has LVH and questionable left atrial enlargement  on EKG today. No Q-wave or poor R-wave progression. Has no significant risk factors except history of smoking and family history. No history of HTN. -Will check BNP given EKG finding and history of fatigue -Follow up in two weeks. Advised him to bring all his medication bottles to that visit   2. Arthralgia of both lower legs: his exam is within normal. It thinks this is somatic manifestation of his poorly controlled psych issue.  - C-reactive protein - Sedimentation Rate  3. Fatigue due to excessive exertion, initial encounter. Likely due to underlying psych issue and psychosocial stressors. Has normal TSH recently. - Brain natriuretic peptide -CRP and ESR as abouve  4. Gastroesophageal reflux disease without esophagitis - Pantoprazole (PROTONIX) 40 MG tablet; Take 1 tablet (40 mg total) by mouth daily.  Dispense: 90 tablet; Refill: 0   Return in about 2 weeks (around 03/06/2017) for Pain/Ax.  Mercy Riding, MD 02/21/17 Pager: [phone]

## 2017-02-21 ENCOUNTER — Other Ambulatory Visit: Payer: Self-pay | Admitting: Family Medicine

## 2017-02-21 DIAGNOSIS — G8929 Other chronic pain: Secondary | ICD-10-CM | POA: Insufficient documentation

## 2017-02-21 DIAGNOSIS — M25562 Pain in left knee: Secondary | ICD-10-CM

## 2017-02-21 DIAGNOSIS — T733XXA Exhaustion due to excessive exertion, initial encounter: Secondary | ICD-10-CM | POA: Insufficient documentation

## 2017-02-21 DIAGNOSIS — M25561 Pain in right knee: Secondary | ICD-10-CM | POA: Insufficient documentation

## 2017-02-21 LAB — BRAIN NATRIURETIC PEPTIDE: BNP: 4.8 pg/mL (ref 0.0–100.0)

## 2017-02-21 LAB — SEDIMENTATION RATE: SED RATE: 2 mm/h (ref 0–15)

## 2017-02-21 LAB — C-REACTIVE PROTEIN: CRP: 2.6 mg/L (ref 0.0–4.9)

## 2017-02-21 NOTE — Assessment & Plan Note (Signed)
His exam is within normal. It thinks this is somatic manifestation of his poorly controlled psych issue.  - C-reactive protein - Sedimentation Rate

## 2017-02-21 NOTE — Assessment & Plan Note (Signed)
Likely due to underlying psych issue and psychosocial stressors. Has normal TSH recently. - Brain natriuretic peptide -CRP and ESR as abouve 

## 2017-02-21 NOTE — Assessment & Plan Note (Signed)
Pantoprazole (PROTONIX) 40 MG tablet; Take 1 tablet (40 mg total) by mouth daily.  Dispense: 90 tablet; Refill: 0

## 2017-02-21 NOTE — Assessment & Plan Note (Signed)
He is chest pain free now. Chest pain is likely anxiety driven. He appears very anxious. Not able administer GAD-7 and PHQ-9 due to language barrier. He has multiple psychosocial stressors. He has history of GAD, PTSD, immigrant, language barrier, unemployed and head of the house (5 children and wife). Not sure if he is taking his medications as prescribed. He is also on Valium. Not sure if he sees psychiatrist or not. He says he sees a lot of doctors. He was last seen in IM clinic about three months ago. Other possibilities are GERD given history of this, cardiac etiology but unlikely. He has history LHC in 2016 with EF of 45-54% but no structural abnormality or CAD. He has LVH and questionable left atrial enlargement on EKG today. No Q-waver or poor R-wave progression. Has no significant risk factors except history of smoking and family history. No history of HTN. -Will check BNP given EKG finding and history of fatigue -Follow up in two weeks. Advised him to bring all his medication bottles to that visit

## 2017-02-22 ENCOUNTER — Telehealth: Payer: Self-pay | Admitting: Student

## 2017-02-22 ENCOUNTER — Encounter: Payer: Self-pay | Admitting: Student

## 2017-02-22 NOTE — Telephone Encounter (Signed)
Attempted to call patient about his paper work using Tax inspectorArabic interpretor with ID [phone]#[id number]. Patient didn't pick up the phone. [facility] [phone].

## 2017-03-06 ENCOUNTER — Ambulatory Visit: Payer: Medicaid Other | Admitting: Student

## 2017-03-07 ENCOUNTER — Ambulatory Visit: Payer: Medicaid Other | Admitting: Student

## 2017-03-13 ENCOUNTER — Ambulatory Visit: Payer: Medicaid Other | Admitting: Family Medicine

## 2017-03-13 NOTE — Congregational Nurse Program (Signed)
Congregational Nurse Program Note  Date of Encounter: 03/13/2017  Past Medical History: Past Medical History:  Diagnosis Date  . GERD (gastroesophageal reflux disease)   . Kidney stone   . Non-cardiac chest pain   . S/P cardiac catheterization 03/2015   cath in [place] and [place], no records,  cath at [facility] normal coronary arteries and normal LV function.    Encounter Details:     CNP Questionnaire - 03/13/17 1040      Patient Demographics   Is this a new or existing patient? Existing   Patient is considered a/an Refugee   Race Asian     Patient Assistance   Location of Patient Assistance Not Applicable   Patient's financial/insurance status Low Income;Medicaid   Uninsured Patient ([employer] trade [employer]) No   Patient referred to apply for the following financial assistance Not Applicable   Food insecurities addressed Not Applicable   Transportation assistance No   Assistance securing medications No   Educational health offerings Spiritual care;Interpersonal relationships;Behavioral health     Encounter Details   Primary purpose of visit Spiritual Care/Support Visit   Was an Emergency Department visit averted? Not Applicable   Does patient have a medical provider? Yes   Patient referred to Follow up with established PCP   Was a mental health screening completed? (GAINS tool) No   Does patient have dental issues? Yes   Was a dental referral made? No resources for a referral   Does patient have vision issues? No   Does your patient have an abnormal blood pressure today? No   Since previous encounter, have you referred patient for abnormal blood pressure that resulted in a new diagnosis or medication change? No   Does your patient have an abnormal blood glucose today? No   Since previous encounter, have you referred patient for abnormal blood glucose that resulted in a new diagnosis or medication change? No   Was there a life-saving intervention made? No          Clinical Intake - 02/20/17 1100      Pre-visit preparation   Pre-visit preparation completed Yes     Patient Literacy   What is the last grade level you completed in school? high school    Office visit to see nurse at [facility] after long absence from English classes at center and family tragedy. [employer] Interpretation line used for interview.  Complains that "my wife and I are not well"; leg and back pain constantly. Youngest child mauled by vicious dog in neighborhood and continues to recover after multiple plastic surgeries. Describes self and spouse still suffering from extreme stress and finding it hard to cope with loss of sleep and fatigue. States he was previously given medication for sleep, but it does not help.  Concerns about employment, finances, and housing also complicating problems. "I do everything in home; take care of children;my wife cannot do anything". Describes spouse as afraid of everything. Previouslyy referred to counseling, but discontinued due to absence of interpreter present.  Denies domestic abuse in home. Requested medical evaluation for self and spouse also.  Scheduled appointment for PCP 03/15/17 at 4:45 pm at [facility]. Ferol Luz, RN/CN

## 2017-03-15 ENCOUNTER — Encounter: Payer: Self-pay | Admitting: Family Medicine

## 2017-03-15 ENCOUNTER — Ambulatory Visit (INDEPENDENT_AMBULATORY_CARE_PROVIDER_SITE_OTHER): Payer: Medicaid Other | Admitting: Family Medicine

## 2017-03-15 VITALS — BP 132/70 | HR 97 | Temp 98.1°F | Ht 69.0 in | Wt 149.0 lb

## 2017-03-15 DIAGNOSIS — M5442 Lumbago with sciatica, left side: Secondary | ICD-10-CM | POA: Diagnosis present

## 2017-03-15 DIAGNOSIS — M5441 Lumbago with sciatica, right side: Secondary | ICD-10-CM | POA: Diagnosis not present

## 2017-03-15 MED ORDER — CYCLOBENZAPRINE HCL 10 MG PO TABS: 10.0000 mg | ORAL_TABLET | Freq: Three times a day (TID) | ORAL | 0 refills | Status: DC | PRN

## 2017-03-15 NOTE — Telephone Encounter (Signed)
Patient was seen in clinic today (03-15-17) and inquired about form.  Form pulled from fax drawer and copy given to patient and original scanned into his chart. Jazmin Hartsell,CMA

## 2017-03-15 NOTE — Progress Notes (Signed)
    Subjective:  Aaron Reyes is a 41 y.o. male who presents to the [facility] today with a chief complaint of back/knee pain. History taken via arabic video interpreter  HPI:  Back pain - severe back pain for the past few weeks, has chronic back pain at baseline but worsening recently after doing some very physical yardwork - radiating down to knees - having difficulty standing for long periods of time which is emotionally draining, has fallen more than 10 times - sleeping on firmer surface and stretching has been beneficial - numbness and tingling in both legs down to end of toes, greater on L - no bowel or bladder incontinence   ROS: Per HPI  Objective:  Physical Exam: BP 132/70   Pulse 97   Temp 98.1 F (36.7 C) (Oral)   Ht  (1.753 m)   Wt 149 lb (67.6 kg)   SpO2 97%   BMI 22.00 kg/m   Gen: NAD, resting comfortably MSK: no edema, cyanosis, or clubbing noted. TTP over b/l sacroiliac area. 5/5 strength and sensation intact in b/l LE. DTRs 2+ in patellar b/l.  Skin: warm, dry Neuro: grossly normal, moves all extremities Psych: Normal affect and thought content   Assessment/Plan:  Back pain Likely acute muscle spasm superimposed on patient's chonic back pain. No red flags on history or exam today. Patient is able to ambulate well and has good strength and sensation. Trial flexeril with OTC tylenol/ibuprofen. Given lower back exercises and information in arabic today.   Leland Her, DO PGY-2, [facility] 03/15/2017 4:10 PM

## 2017-03-15 NOTE — Patient Instructions (Addendum)
Please use over the counter tylenol and ibuprofen as well as flexeril as needed.   You were given information in Arabic about back pain and some exercises to try.   Feel free to call with any questions or concerns at any time, at [phone].   Take care,  Dr. Leland Her, DO [facility]  Do's and Don'ts with Low Back Pain. Arabic. Do's and Don'ts with Low Back Pain            Sitting Do: Marland Kitchen Sit as little as possible and then only for short periods. . Place a supportive towel roll at the belt line of the back especially when sitting in a car. . When getting up from sitting, keep the normal curves in your back. Move to the front of the seat and stand up by straightening your legs. Avoid bending forward at the waist. . Try to keep the normal curves in your back at all times. Don't: . Do not sit on a low soft couch with a deep seat. It will force you to sit with your hips lower than your knees and will round your back. You will loose the normal curve in your back. . Do not place your legs straight out in front of you while sitting (e.g. sitting in the bath tub).   : .         . .              . .          .            .      . .         . : .         .                     .       . .        )    (.   2 Do's and Don'ts with Low Back Pain.  Arabic. [email or web address] Standing Do: Marland Kitchen If you must stand for a long period of time, keep one leg up on a foot stool. . Adapt work heights. Don't: . Avoid half bent positions. Lifting Avoid lifting if you can. Do: . Use the correct lifting technique. Keep your back straight when lifting. Never stoop or bend forward. Stand close to the load, have a firm footing and wide stance. Kneel on one knee, keeping the back straight. Have a secure grip on the load and lift by straightening your knees. Do a steady lift. Shift your feet to turn and do not twist your back. Don't: . Do not jerk when you lift. . Do not bend over the object you are lifting.   : .               . .   . : .    .        .  : .     .    .       .        .         .        .  .      . : .     . .      .       3 [email or web address] Do's and Don'ts with Low Back Pain. Arabic. Lying  Do: . Sleep on a good firm surface. . If your bed sags, use slats or plywood supports between the mattress and base to firm it. You also can place the mattress on the floor, a simple but temporary solution. . You may be more comfortable at night when you use a pillow for support. Don't: . Do not sleep on your stomach unless advised to do so by your doctor or physical therapist. Bending forward Do: Marland Kitchen Keep the natural curves of your back when doing these and other activities: making a bed, vacuuming,  sweeping or mopping the floor, weeding the garden or raking leaves.      : .     . .                .              . .          . : .           .     : .            :                      .    4 Do's and Don'ts with Low Back Pain. Arabic. [email or web address] Coughing and sneezing Do: Marland Kitchen Bend backwards to increase the curve of your back while you cough or sneeze. Driving a car Do: Marland Kitchen Drive the car as little as possible. It is better to be a passenger than to drive yourself. . Move the seat forward to the steering wheel. Your seat must be close enough to the wheel to keep the natural curves of your back. If your hips are lower than your knees in this position, raise yourself by sitting on a pillow. Exercises for low back pain Safety Guidelines . An increase in your low back pain can be expected with these exercises. This is acceptable as long as your leg symptoms are not increasing. . If while doing these exercises, your pain worsens or you have new pain or symptoms, stop the exercises and discuss your symptoms with your doctor or physical therapist. . Stop exercising and let your doctor or physical therapist know right away if you have any change in your bowel or bladder control or any increase in weakness in your leg or foot.    : .          .    : .       .        . .       Marland Kitchen               .               .       .         .         . .                               . .                              . 5 [email or web address] Do's and Don'ts with Low Back Pain. Arabic. Exercises ?. Press-Ups: Keep your back and buttocks relaxed and use your arms to press up. Concentrate on keeping your  hips down and push up your upper body as high as possible. ?. Double Knee to Chest: Grasp both your knees with your hands and pull toward your shoulders. Hold the stretch for 1 second. Let your knees return, but keep them bent at arms length. ?. Lumbar Spine Stretches: Lie on your back. Bring your knees towards your chest. Rotate your knees towards the pain.  .?  :          .               . .?    :      .       .        . Marland Kitchen?     :   .    .     . 6 Do's and Don'ts with Low Back Pain. Arabic. [email or web address] ?. Side-lying Position: Lie on your side and face forward. Have both arms straight in  front and bend your knees. Turn your head as you move your top arm across your body as far as you can. Keep your arm in place and turn your head back to the starting position. Look back again and turn your head farther if you can. Bring your head and arm back to the starting position. Relax and repeat 10 times on each side. ?. Hamstring Stretch: Lie on your back with your legs out straight. Raise your leg up and put your hands around the upper leg for support. Slowly straighten the raised knee until you feel a stretch in the back of the upper leg. Hold, then relax and repeat 10 times on each leg. .?   :        .      .              .           .           .      .        . Marland Kitchen?  :        .            .            .         10    . 7 Do's and Don'ts with Low Back Pain. Arabic. [email or web address].[email or web address]  2013 - November 22, 2015, Health Information Translations. Unless otherwise stated, user may print or download information from www.healthinfotranslations.org for personal, non-commercial use only. The medical information found on this website should not be used in place of a consultation with your doctor or other health care provider. You should always seek the advice of your doctor or other qualified health care provider before you start or stop any treatment or with any questions you may have about a medical condition. The [facility], [facility],  [facility] and [facility] are not responsible for injuries or damages you may incur as a result of your stopping medical treatment or your failure to obtain treatment. ?. Standing Arch: Stand with your feet apart and hands on the small of your back with fingers pointing backwards. Bend backwards at the waist, supporting the trunk with your hands. Keep your knees straight. Hold for 5 seconds. Repeat 3 to 5 times. ?. Side Glides: Stand at a right angle to the wall about 2 feet out from the wall. Lean your shoulder into the wall.  Move your hips toward the wall, keeping your legs together and your knees straight. Return to the starting position. .?  :                 .         .  .      5 .   3  5 . .?  :          .    .          .    .  Exercises for Your Back. Arabic. Exercises for Your Back   Most back pain is due to a lack of exercise or an injury. These exercises will help you move better and strengthen the muscles that support your back. Do the exercises slowly. If you have pain while doing these exercises, stop doing the exercises and talk to your doctor or physical therapist. Lorenz Coaster on your back on a firm surface, such as the floor or a mat. Repeat each exercise _______ times. ?.Pelvic tilt Tighten your stomach and buttocks. Push your lower back towards the floor. Hold for 10 seconds then relax. ?.Knee to chest Pull one knee toward your chest until you feel a light stretch in your lower back and buttocks. Hold for 10 seconds then put your foot back down. Repeat this with the other knee. ?.Double knee to chest Slowly bring both  knees to your chest. Hold for 5 seconds and keep your back relaxed and flat on the floor.          .               .     .                      .          .    _______ . .?     .       Marland Kitchen      10   . Marland Kitchen?                   .      10        .     . .?         .      5        . 2 Exercises for Your Back. Arabic. [email or web address] ?.Curl up [employer]. Fold your arms across your chest and raise your shoulders until they come off the floor. Then slowly return to the starting position. ?.Diagonal curl up With your arms held forward, lift your shoulders off the floor. Bring your right shoulder toward your left knee. Return to the floor. Then bring your left shoulder to your right knee. ?.Hip rolls With your knees together, roll your hips slowly from side to side. Keep your shoulders on the floor. ?.[facility] your knees and keep your feet flat on the floor. Lift your buttocks slowly up and then slowly return them to the floor. .?      .          .      . .?            .      .   .       . .?             .      Marland Kitchen .?          .         Marland Kitchen  3 [email or web address] Exercises for Your Back. Arabic. ?.Low back stretch On your hands and knees, push your chest toward the floor, reaching forward as far as you can. Sit back on your feet. ?.Cat stretch Start on your hands and knees. Tuck your chin in toward your chest and tighten your stomach muscles while arching your back. ?.Hamstring stretch Lie on your back. Bend your hip and knee to 90 degrees. Then straighten your knee as far as possible. Repeat this exercise with the other leg. .?                   .     . .?      .           . .?     .     90 .       .      . 4  2005 - September 13, 2015, Health Information Translations. Unless otherwise stated, user may print or download information from www.healthinfotranslations.org for personal, non-commercial use only. The medical information found on this website should not be used in place of a consultation with your doctor or other health care provider. You should always seek the advice of your doctor or other qualified health care provider before you start or stop any treatment or with any questions you may have about a medical condition. The [facility], [facility], [facility] and [facility] are not responsible for injuries or damages you may incur as a result of your stopping medical treatment or your failure to obtain  treatment. Exercises for Your Back. Arabic. [email or web address] ?.Wall slide Stand with your back against a wall and your feet about 12 inches or 30 centimeters from the wall. Bend your knees as you slowly lower your buttocks while sliding down the wall. Hold for 10 seconds. ?.Press ups Lie on your stomach. Push up with your arms while keeping your back and stomach muscles relaxed. Keep a slight bend in your elbows. Aerobic exercises are also needed to have a healthy back. Aerobic exercises such as walking or swimming should be done 3 to 4 times a week for 30 to 45 minutes. Talk to your doctor before beginning any form of exercise. .?              12   30 .          Marland Kitchen      10 . Marland Kitchen?     .         .       .         .           45 .         Marland Kitchen

## 2017-03-17 ENCOUNTER — Emergency Department (HOSPITAL_COMMUNITY)
Admission: EM | Admit: 2017-03-17 | Discharge: 2017-03-17 | Disposition: A | Discharge status: Home / Self Care | Payer: Medicaid Other | Attending: Emergency Medicine | Admitting: Emergency Medicine

## 2017-03-17 ENCOUNTER — Encounter (HOSPITAL_COMMUNITY): Payer: Self-pay

## 2017-03-17 DIAGNOSIS — Y999 Unspecified external cause status: Secondary | ICD-10-CM | POA: Insufficient documentation

## 2017-03-17 DIAGNOSIS — F17210 Nicotine dependence, cigarettes, uncomplicated: Secondary | ICD-10-CM | POA: Insufficient documentation

## 2017-03-17 DIAGNOSIS — Z7982 Long term (current) use of aspirin: Secondary | ICD-10-CM | POA: Diagnosis not present

## 2017-03-17 DIAGNOSIS — Y929 Unspecified place or not applicable: Secondary | ICD-10-CM | POA: Diagnosis not present

## 2017-03-17 DIAGNOSIS — Y93H9 Activity, other involving exterior property and land maintenance, building and construction: Secondary | ICD-10-CM | POA: Diagnosis not present

## 2017-03-17 DIAGNOSIS — W268XXA Contact with other sharp object(s), not elsewhere classified, initial encounter: Secondary | ICD-10-CM | POA: Insufficient documentation

## 2017-03-17 DIAGNOSIS — S61210A Laceration without foreign body of right index finger without damage to nail, initial encounter: Secondary | ICD-10-CM | POA: Diagnosis present

## 2017-03-17 DIAGNOSIS — Z79899 Other long term (current) drug therapy: Secondary | ICD-10-CM | POA: Insufficient documentation

## 2017-03-17 MED ORDER — OXYCODONE-ACETAMINOPHEN 5-325 MG PO TABS
1.0000 | ORAL_TABLET | ORAL | Status: DC | PRN
Start: 2017-03-17 — End: 2017-03-17
  Administered 2017-03-17: 1 via ORAL

## 2017-03-17 MED ORDER — OXYCODONE-ACETAMINOPHEN 5-325 MG PO TABS
ORAL_TABLET | ORAL | Status: AC
  Filled 2017-03-17: qty 1

## 2017-03-17 MED ORDER — BUPIVACAINE HCL (PF) 0.5 % IJ SOLN
10.0000 mL | Freq: Once | INTRAMUSCULAR | Status: AC
  Administered 2017-03-17: 10 mL
  Filled 2017-03-17: qty 10

## 2017-03-17 NOTE — ED Provider Notes (Signed)
[facility]-EMER[facility]ENCY DEPT Provider Note   CSN: [id number] Arrival date & time: 03/17/17  0008     History   Chief Complaint Chief Complaint  Patient presents with  . Laceration    HPI Aaron Reyes is a 41 y.o. male.  HPI Aaron Reyes is a 41 y.o. male presents to emergency department complaining of finger pain. Patient stated that he was trying to fix a broken lawnmower, states he was scrubbing the blades when he accidentally hit his finger sustaining a laceration. Patient states that he had severe bleeding which was stopped with pressure. He also reports pain with any movement of the finger. He is not sure when his last tetanus was.  Past Medical History:  Diagnosis Date  . GERD (gastroesophageal reflux disease)   . Kidney stone   . Non-cardiac chest pain   . S/P cardiac catheterization 03/2015   cath in [place] and [place], no records,  cath at [facility] normal coronary arteries and normal LV function.    Patient Active Problem List   Diagnosis Date Noted  . Arthralgia of both lower legs 02/21/2017  . Fatigue due to excessive exertion 02/21/2017  . Bilateral sensorineural hearing loss 02/07/2017  . Cervicalgia 10/19/2016  . GERD (gastroesophageal reflux disease) 09/04/2016  . Generalized anxiety disorder 01/26/2016  . Post traumatic stress disorder 01/26/2016  . 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 Back pain 04/26/2015  . Adjustment disorder with anxious mood 04/26/2015  . 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 Dysuria 04/26/2015  . Refugee health examination 03/18/2015  . Housing problems 03/18/2015  . Testicular mass 03/18/2015  . Inguinal hernia 03/18/2015  . Chest pain 02/15/2015  . Hypokalemia 02/15/2015    Past Surgical History:  Procedure Laterality Date  . CARDIAC CATHETERIZATION    . CARDIAC CATHETERIZATION     x 2  . CARDIAC CATHETERIZATION N/A 04/11/2015   Procedure: Left Heart Cath and Coronary Angiography;  Surgeon: Lyn Records, MD;   Location: [facility] INVASIVE CV LAB;  Service: Cardiovascular;  Laterality: N/A;  . HERNIA REPAIR         Home Medications    Prior to Admission medications   Medication Sig Start Date End Date Taking? Authorizing Provider  aspirin EC 81 MG tablet Take 1 tablet (81 mg total) by mouth daily. 04/28/15   Leone Brand, NP  busPIRone (BUSPAR) 15 MG tablet Take 1 tablet (15 mg total) by mouth 3 (three) times daily. 12/10/16   Bing Neighbors, FNP  cyclobenzaprine (FLEXERIL) 10 MG tablet Take 1 tablet (10 mg total) by mouth 3 (three) times daily as needed for muscle spasms. 03/15/17   Leland Her, DO  diazepam (VALIUM) 2 MG tablet Take 1 tablet (2 mg total) by mouth every 4 (four) hours as needed for muscle spasms. 03/23/16   Lorre Nick, MD  escitalopram (LEXAPRO) 20 MG tablet Take 1 tablet (20 mg total) by mouth at bedtime. 12/10/16   Bing Neighbors, FNP  nitroGLYCERIN (NITROSTAT) 0.4 MG SL tablet Place 1 tablet (0.4 mg total) under the tongue every 5 (five) minutes as needed for chest pain. Patient not taking: Reported on 12/10/2016 04/06/15   Leone Brand, NP  pantoprazole (PROTONIX) 40 MG tablet Take 1 tablet (40 mg total) by mouth daily. 02/20/17   Almon Hercules, MD  traZODone (DESYREL) 100 MG tablet Take 2 tablets (200 mg total) by mouth at bedtime. 12/10/16   Bing Neighbors, FNP    Family History  Family History  Problem Relation Age of Onset  . CAD Father   . Heart attack Father   . Hypertension Father   . Stroke Father   . Cancer Mother   . Diabetes Mother   . Hypertension Mother     Social History Social History  Substance Use Topics  . Smoking status: Current Every Day Smoker    Packs/day: 0.25    Types: Cigarettes  . Smokeless tobacco: Never Used  . Alcohol use No     Allergies   Patient has no known allergies.   Review of Systems Review of Systems  Musculoskeletal: Positive for arthralgias.  Skin: Positive for wound.  Neurological: Positive for  weakness. Negative for numbness.  All other systems reviewed and are negative.    Physical Exam Updated Vital Signs BP 119/82 (BP Location: Left Arm)   Pulse 90   Temp 97.7 F (36.5 C) (Oral)   Resp 16   Ht 5' 4.96" (1.65 m)   Wt 65 kg (143 lb 4.8 oz)   SpO2 100%   BMI 23.87 kg/m   Physical Exam  Constitutional: He appears well-developed and well-nourished. No distress.  Eyes: Conjunctivae are normal.  Neck: Neck supple.  Cardiovascular: Normal rate.   Pulmonary/Chest: No respiratory distress.  Abdominal: He exhibits no distension.  Musculoskeletal:  2 cm laceration to the dorsal aspect of the PIP joint of the right index finger. Pain with any range of motion of the finger at any of the joints. Diffuse tenderness to palpation. Capillary refill less than 2 seconds distally. Sensation is intact.  Skin: Skin is warm and dry.  Nursing note and vitals reviewed.    ED Treatments / Results  Labs (all labs ordered are listed, but only abnormal results are displayed) Labs Reviewed - No data to display  EKG  EKG Interpretation None       Radiology No results found.  Procedures .Nerve Block Date/Time: 03/17/2017 4:35 AM Performed by: Jaynie Crumble Authorized by: Jaynie Crumble   Consent:    Consent obtained:  Verbal   Consent given by:  Patient   Risks discussed:  Bleeding, allergic reaction and infection   Alternatives discussed:  No treatment and alternative treatment Indications:    Indications:  Pain relief and procedural anesthesia Location:    Body area:  Upper extremity   Laterality:  Right Pre-procedure details:    Skin preparation:  Povidone-iodine   Preparation: Patient was prepped and draped in usual sterile fashion   Skin anesthesia (see MAR for exact dosages):    Skin anesthesia method:  None Procedure details (see MAR for exact dosages):    Block needle gauge:  25 G   Anesthetic injected:  Bupivacaine 0.25% w/o epi   Steroid  injected:  None   Injection procedure:  Anatomic landmarks identified Post-procedure details:    Dressing:  None   Outcome:  Anesthesia achieved   Patient tolerance of procedure:  Tolerated well, no immediate complications  .Marland KitchenLaceration Repair Date/Time: 03/17/2017 5:21 AM Performed by: Jaynie Crumble Authorized by: Jaynie Crumble   Consent:    Consent obtained:  Verbal   Consent given by:  Patient   Risks discussed:  Infection, pain, need for additional repair, tendon damage and nerve damage   Alternatives discussed:  No treatment and delayed treatment Repair type:    Repair type:  Simple Pre-procedure details:    Preparation:  Patient was prepped and draped in usual sterile fashion Exploration:    Hemostasis achieved with:  Direct pressure   Wound exploration: wound explored through full range of motion     Wound extent: no nerve damage noted, no tendon damage noted, no underlying fracture noted and no vascular damage noted     Contaminated: no   Treatment:    Area cleansed with:  Betadine and saline   Amount of cleaning:  Extensive   Irrigation solution:  Sterile saline   Irrigation volume:    Irrigation method:  Pressure wash and syringe   Visualized foreign bodies/material removed: no   Skin repair:    Repair method:  Sutures   Suture size:  5-0   Suture material:  Prolene   Suture technique:  Simple interrupted Approximation:    Approximation:  Close   Vermilion border: well-aligned   Post-procedure details:    Dressing:  Antibiotic ointment, non-adherent dressing and splint for protection   Patient tolerance of procedure:  Tolerated well, no immediate complications   (including critical care time)  Medications Ordered in ED Medications  oxyCODONE-acetaminophen (PERCOCET/ROXICET) 5-325 MG per tablet 1 tablet (1 tablet Oral Given 03/17/17 0111)  oxyCODONE-acetaminophen (PERCOCET/ROXICET) 5-325 MG per tablet (not administered)  bupivacaine  (MARCAINE) 0.5 % injection 10 mL (10 mLs Infiltration Given 03/17/17 0428)     Initial Impression / Assessment and Plan / ED Course  I have reviewed the triage vital signs and the nursing notes.  Pertinent labs & imaging results that were available during my care of the patient were reviewed by me and considered in my medical decision making (see chart for details).     PT with finger laceration. No evidence of nerve damage, tendon damage, vascular damage. Initially refused to move due to pain, once nerve block preformed, full ROM at each joint against resistance with flexion and extension. Tendon intact on exploration. Irrigated and soaked thoroughly. Closed with sutures. Splinted. Follow up as needed and for suture removal. I do not think imaging needed, does not extend to the bone, full rom of the finger.   Vitals:   03/17/17 0057 03/17/17 0100 03/17/17 0415 03/17/17 0500  BP: 119/82  126/86 116/82  Pulse: 90  68 66  Resp: 16     Temp: 97.7 F (36.5 C)     TempSrc: Oral     SpO2: 100%  95% 95%  Weight:  65 kg (143 lb 4.8 oz)    Height:  5' 4.96" (1.65 m)       Final Clinical Impressions(s) / ED Diagnoses   Final diagnoses:  Laceration of right index finger without foreign body without damage to nail, initial encounter    New Prescriptions New Prescriptions   No medications on file     Jaynie Crumble, PA-C 03/17/17 1610    Devoria Albe, MD 03/17/17 [phone]

## 2017-03-17 NOTE — ED Triage Notes (Signed)
Pt. Presents with his son. Pt. Speaks Arabic. Son states Pt. Was cleaning the blades on the lawn mower when he cut his right pointer finger on the blade.

## 2017-03-17 NOTE — Discharge Instructions (Signed)
Keep clean. Triple antibiotic ointment twice a day. Follow up in 7 days for suture removal. Splint to prevent stitches from coming out.

## 2017-03-17 NOTE — ED Notes (Signed)
Pt understood dc material. NAD noted. Follow up understood

## 2017-03-17 NOTE — Assessment & Plan Note (Signed)
Likely acute muscle spasm superimposed on patient's chonic back pain. No red flags on history or exam today. Patient is able to ambulate well and has good strength and sensation. Trial flexeril with OTC tylenol/ibuprofen. Given lower back exercises and information in arabic today.

## 2017-03-19 ENCOUNTER — Emergency Department (HOSPITAL_COMMUNITY): Payer: Medicaid Other

## 2017-03-19 ENCOUNTER — Emergency Department (HOSPITAL_COMMUNITY)
Admission: EM | Admit: 2017-03-19 | Discharge: 2017-03-19 | Disposition: A | Discharge status: Home / Self Care | Payer: Medicaid Other | Attending: Emergency Medicine | Admitting: Emergency Medicine

## 2017-03-19 ENCOUNTER — Encounter (HOSPITAL_COMMUNITY): Payer: Self-pay

## 2017-03-19 DIAGNOSIS — S0990XA Unspecified injury of head, initial encounter: Secondary | ICD-10-CM | POA: Diagnosis present

## 2017-03-19 DIAGNOSIS — W19XXXA Unspecified fall, initial encounter: Secondary | ICD-10-CM

## 2017-03-19 DIAGNOSIS — W108XXA Fall (on) (from) other stairs and steps, initial encounter: Secondary | ICD-10-CM | POA: Diagnosis not present

## 2017-03-19 DIAGNOSIS — S060X0A Concussion without loss of consciousness, initial encounter: Secondary | ICD-10-CM | POA: Insufficient documentation

## 2017-03-19 DIAGNOSIS — Z79899 Other long term (current) drug therapy: Secondary | ICD-10-CM | POA: Insufficient documentation

## 2017-03-19 DIAGNOSIS — F17210 Nicotine dependence, cigarettes, uncomplicated: Secondary | ICD-10-CM | POA: Insufficient documentation

## 2017-03-19 DIAGNOSIS — Z7982 Long term (current) use of aspirin: Secondary | ICD-10-CM | POA: Insufficient documentation

## 2017-03-19 DIAGNOSIS — Y9222 Religious institution as the place of occurrence of the external cause: Secondary | ICD-10-CM | POA: Insufficient documentation

## 2017-03-19 DIAGNOSIS — Y939 Activity, unspecified: Secondary | ICD-10-CM | POA: Insufficient documentation

## 2017-03-19 DIAGNOSIS — Y999 Unspecified external cause status: Secondary | ICD-10-CM | POA: Diagnosis not present

## 2017-03-19 LAB — URINALYSIS, ROUTINE W REFLEX MICROSCOPIC
BACTERIA UA: NONE SEEN
Bilirubin Urine: NEGATIVE
Glucose, UA: NEGATIVE mg/dL
Ketones, ur: NEGATIVE mg/dL
Leukocytes, UA: NEGATIVE
Nitrite: NEGATIVE
PROTEIN: NEGATIVE mg/dL
SPECIFIC GRAVITY, URINE: 1.013 (ref 1.005–1.030)
SQUAMOUS EPITHELIAL / LPF: NONE SEEN
WBC UA: NONE SEEN WBC/hpf (ref 0–5)
pH: 5 (ref 5.0–8.0)

## 2017-03-19 LAB — CBC WITH DIFFERENTIAL/PLATELET
BASOS ABS: 0 10*3/uL (ref 0.0–0.1)
BASOS PCT: 0 %
EOS ABS: 0.1 10*3/uL (ref 0.0–0.7)
EOS PCT: 1 %
HCT: 44.6 % (ref 39.0–52.0)
Hemoglobin: 15.6 g/dL (ref 13.0–17.0)
Lymphocytes Relative: 34 %
Lymphs Abs: 2.4 10*3/uL (ref 0.7–4.0)
MCH: 29.3 pg (ref 26.0–34.0)
MCHC: 35 g/dL (ref 30.0–36.0)
MCV: 83.8 fL (ref 78.0–100.0)
Monocytes Absolute: 0.4 10*3/uL (ref 0.1–1.0)
Monocytes Relative: 6 %
Neutro Abs: 4.1 10*3/uL (ref 1.7–7.7)
Neutrophils Relative %: 59 %
PLATELETS: 216 10*3/uL (ref 150–400)
RBC: 5.32 MIL/uL (ref 4.22–5.81)
RDW: 12.2 % (ref 11.5–15.5)
WBC: 7 10*3/uL (ref 4.0–10.5)

## 2017-03-19 LAB — COMPREHENSIVE METABOLIC PANEL
ALBUMIN: 3.7 g/dL (ref 3.5–5.0)
ALT: 30 U/L (ref 17–63)
AST: 25 U/L (ref 15–41)
Alkaline Phosphatase: 81 U/L (ref 38–126)
Anion gap: 8 (ref 5–15)
BUN: 9 mg/dL (ref 6–20)
CALCIUM: 8.9 mg/dL (ref 8.9–10.3)
CHLORIDE: 105 mmol/L (ref 101–111)
CO2: 22 mmol/L (ref 22–32)
CREATININE: 0.72 mg/dL (ref 0.61–1.24)
GFR calc non Af Amer: >60 mL/min (ref >60)
GLUCOSE: 129 mg/dL — AB (ref 65–99)
Potassium: 3.9 mmol/L (ref 3.5–5.1)
SODIUM: 135 mmol/L (ref 135–145)
Total Bilirubin: 0.9 mg/dL (ref 0.3–1.2)
Total Protein: 7 g/dL (ref 6.5–8.1)

## 2017-03-19 LAB — SALICYLATE LEVEL: Salicylate Lvl: <7 mg/dL (ref 2.8–30.0)

## 2017-03-19 LAB — ETHANOL: Alcohol, Ethyl (B): <10 mg/dL (ref <10)

## 2017-03-19 LAB — I-STAT CHEM 8, ED
BUN: 11 mg/dL (ref 6–20)
CALCIUM ION: 1.16 mmol/L (ref 1.15–1.40)
CHLORIDE: 104 mmol/L (ref 101–111)
Creatinine, Ser: 0.6 mg/dL — ABNORMAL LOW (ref 0.61–1.24)
Glucose, Bld: 130 mg/dL — ABNORMAL HIGH (ref 65–99)
HEMATOCRIT: 45 % (ref 39.0–52.0)
Hemoglobin: 15.3 g/dL (ref 13.0–17.0)
Potassium: 4 mmol/L (ref 3.5–5.1)
SODIUM: 139 mmol/L (ref 135–145)
TCO2: 24 mmol/L (ref 22–32)

## 2017-03-19 LAB — CBG MONITORING, ED: GLUCOSE-CAPILLARY: 118 mg/dL — AB (ref 65–99)

## 2017-03-19 LAB — I-STAT TROPONIN, ED
Troponin i, poc: 0 ng/mL (ref 0.00–0.08)
Troponin i, poc: 0 ng/mL (ref 0.00–0.08)

## 2017-03-19 LAB — TSH: TSH: 1.695 u[IU]/mL (ref 0.350–4.500)

## 2017-03-19 LAB — RAPID URINE DRUG SCREEN, HOSP PERFORMED
AMPHETAMINES: NOT DETECTED
BENZODIAZEPINES: NOT DETECTED
Barbiturates: NOT DETECTED
COCAINE: NOT DETECTED
OPIATES: POSITIVE — AB
TETRAHYDROCANNABINOL: NOT DETECTED

## 2017-03-19 LAB — ACETAMINOPHEN LEVEL

## 2017-03-19 IMAGING — CR DG KNEE COMPLETE 4 VIEWS LEFT
4 series · 4 of 4 positions shown · non-contrast
Comparison: None.

CLINICAL DATA: Pain following fall

EXAM:
LEFT KNEE - COMPLETE 4+ VIEW

[knee ap]
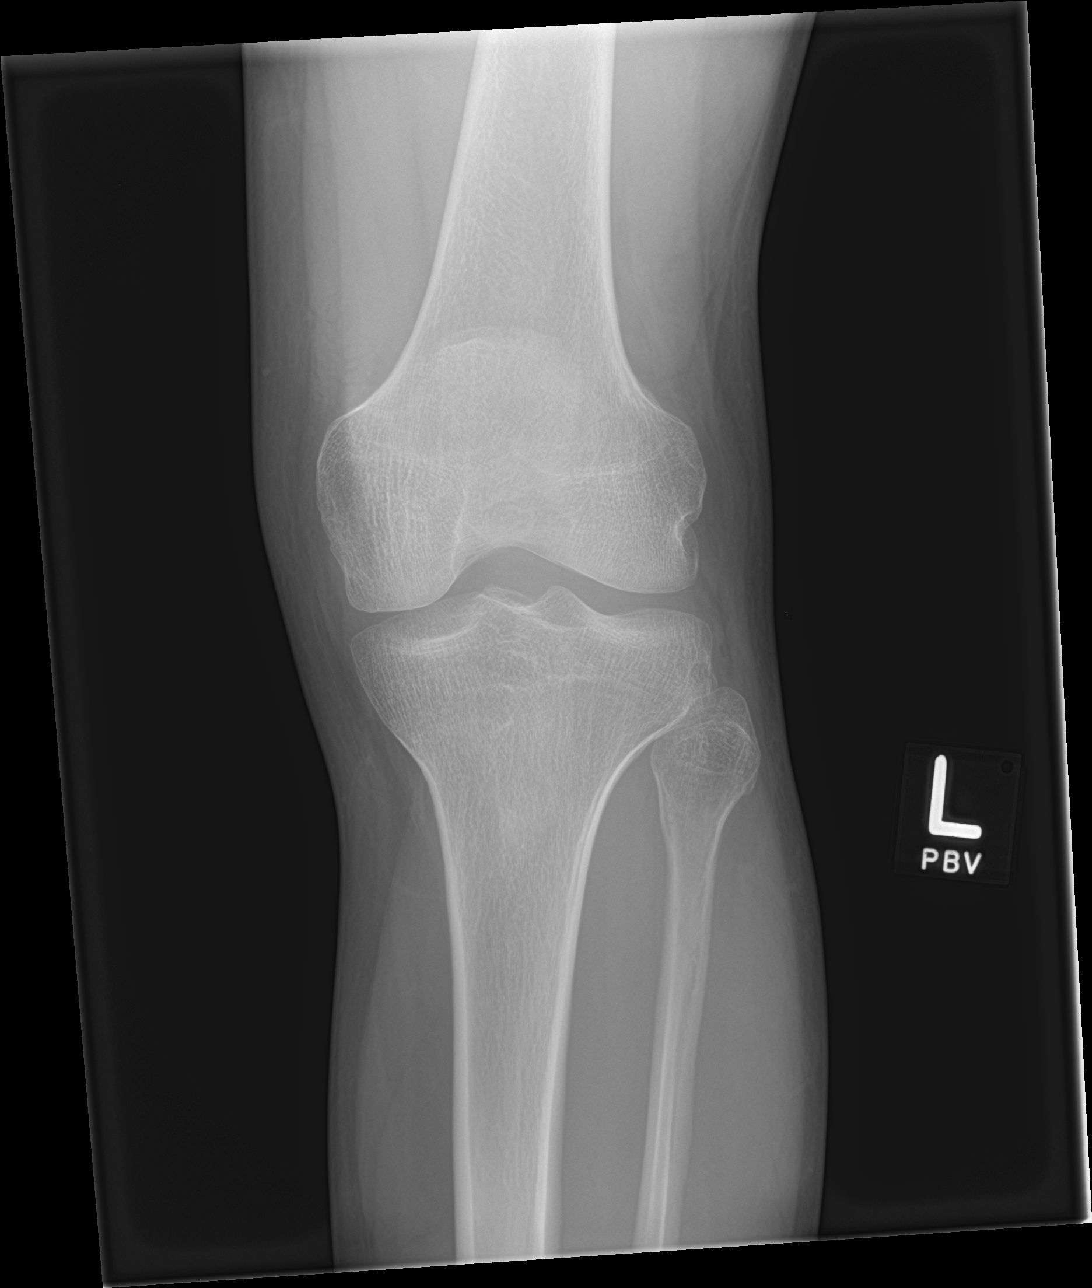

[knee lat]
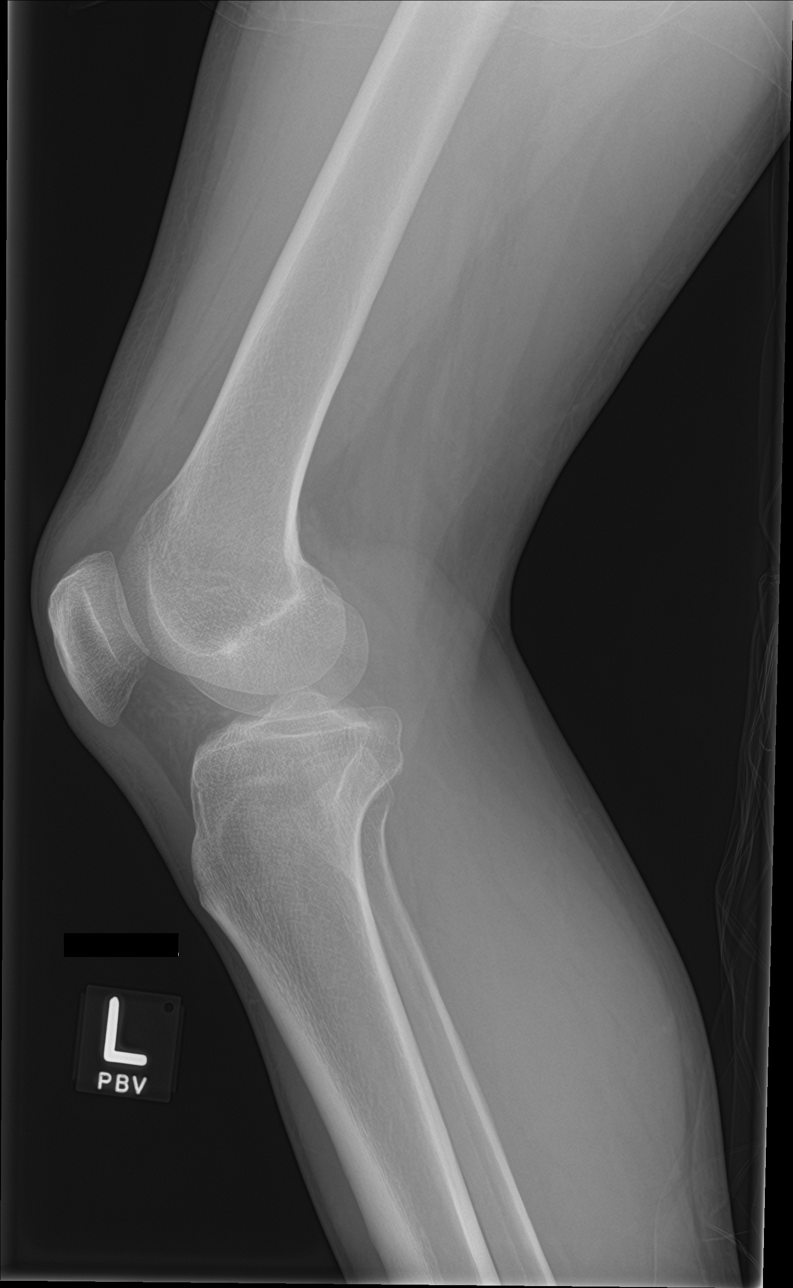

[knee obl (1 of 2)]
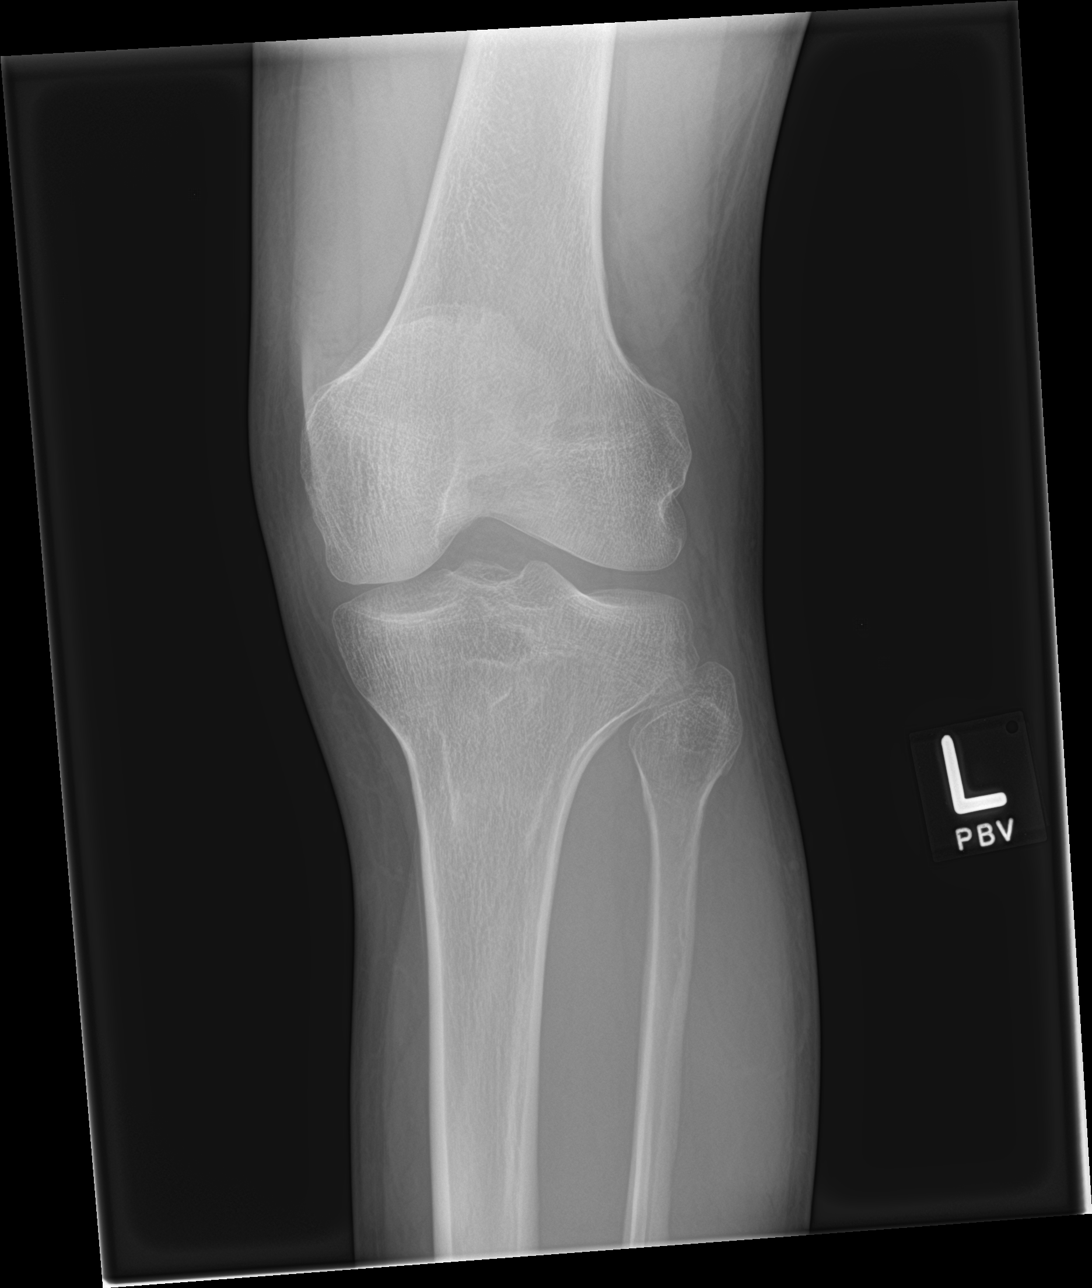

[knee obl (2 of 2)]
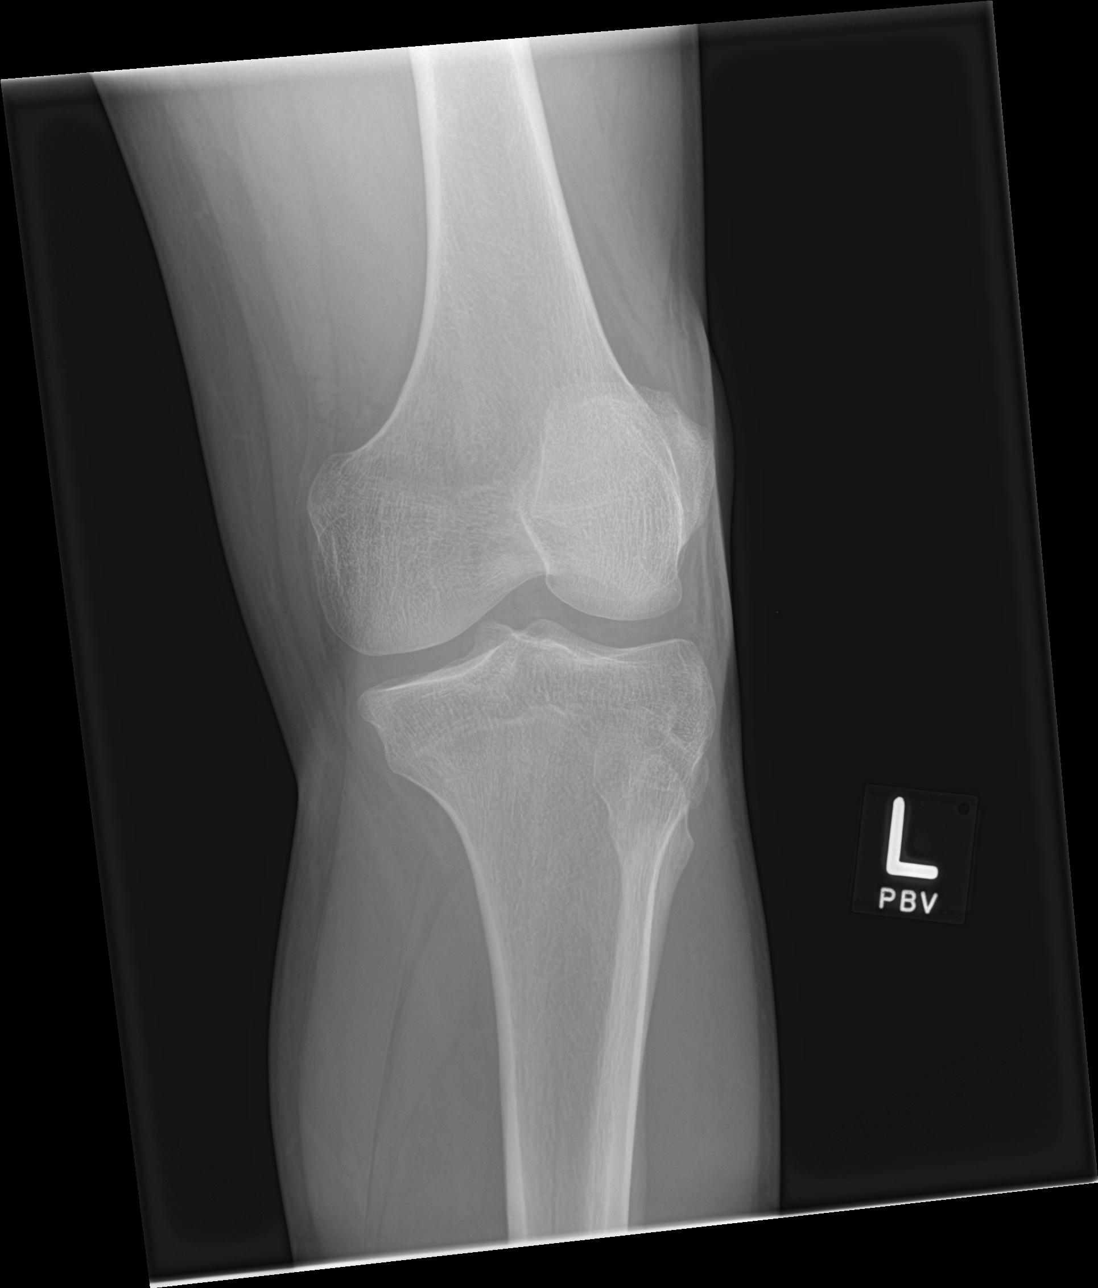

[4 of 4 positions shown; findings below may reference images not displayed]

FINDINGS: Frontal, lateral, and bilateral oblique views were obtained. There
is no fracture or dislocation. No joint effusion. Joint spaces
appear normal. No erosive change.
IMPRESSION: No fracture or dislocation. No joint effusion. No evident
arthropathy.

## 2017-03-19 IMAGING — DX DG CHEST PORT 1 VIEW
1 series · 1 of 1 positions shown · non-contrast
Comparison: [DATE]

CLINICAL DATA: Pain after fall.

EXAM:
PORTABLE CHEST 1 VIEW

[chest ap]
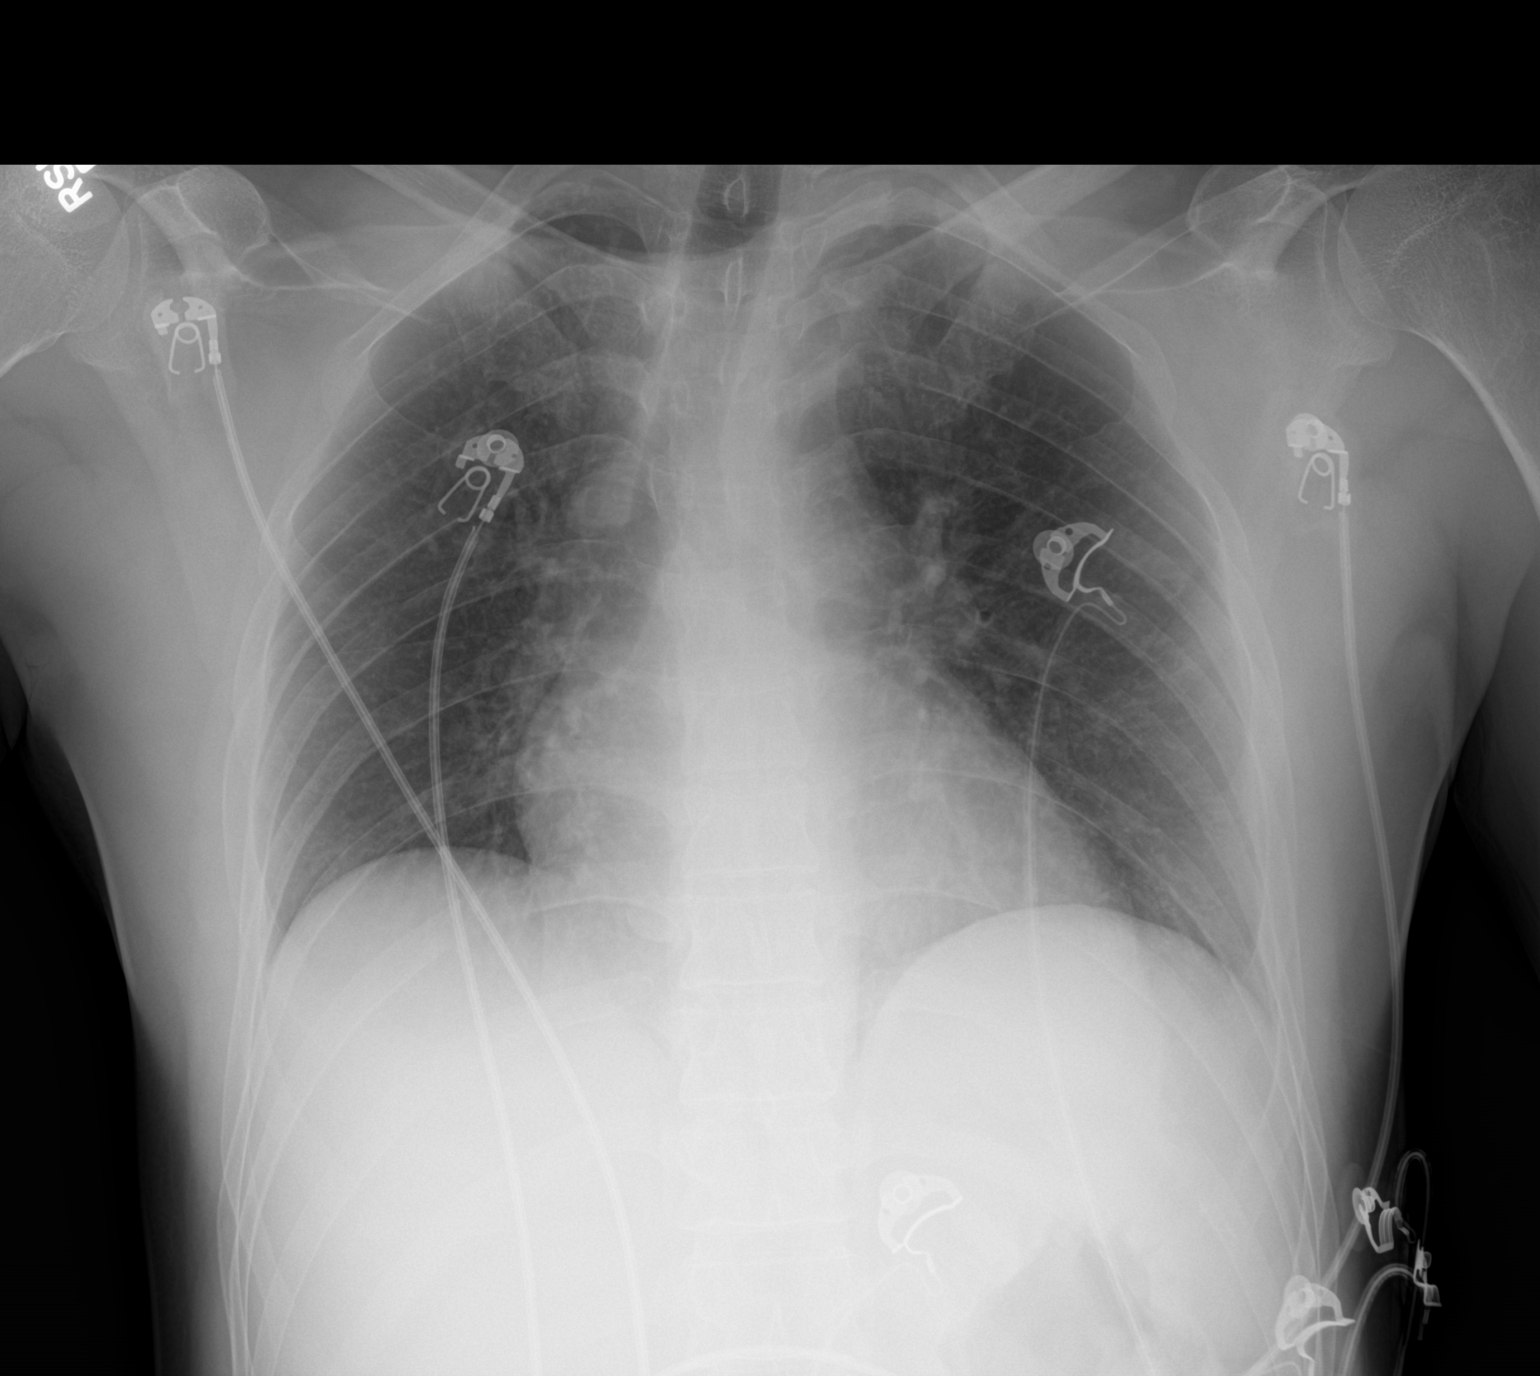

[1 of 1 positions shown; findings below may reference images not displayed]

FINDINGS: The heart size and mediastinal contours are within normal limits.
Both lungs are clear. The visualized skeletal structures are
unremarkable.
IMPRESSION: No active disease.

## 2017-03-19 IMAGING — CT CT CERVICAL SPINE WO CONTRAST
4 of 8 series · 12 of 33 positions shown, 13 images · non-contrast
Comparison: CT head and cervical spine dated [DATE].

CLINICAL DATA: Altered mental status. Concern for fall down stair
well.

EXAM:
CT HEAD WITHOUT CONTRAST
CT CERVICAL SPINE WITHOUT CONTRAST
TECHNIQUE: Multidetector CT imaging of the head and cervical spine was
performed following the standard protocol without intravenous
contrast. Multiplanar CT image reconstructions of the cervical spine
were also generated.

[Series 8: c_spine 2.0 st · axial · 0.35mm/px · z∈[-278,-178]mm · 3 of 102 slices shown, 4 images]
[im 26/102  soft-tissue]
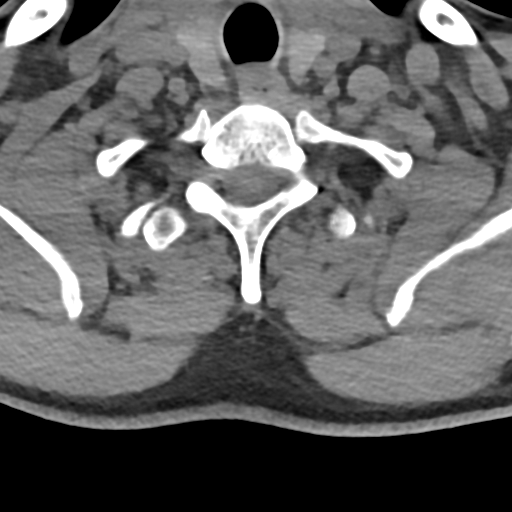
[im 26/102  bone]
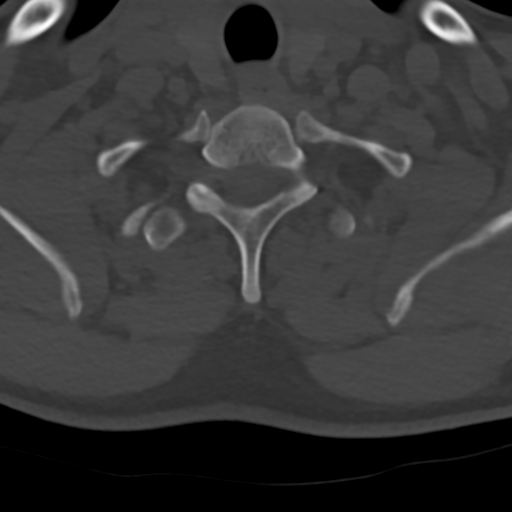
[im 51/102  bone]
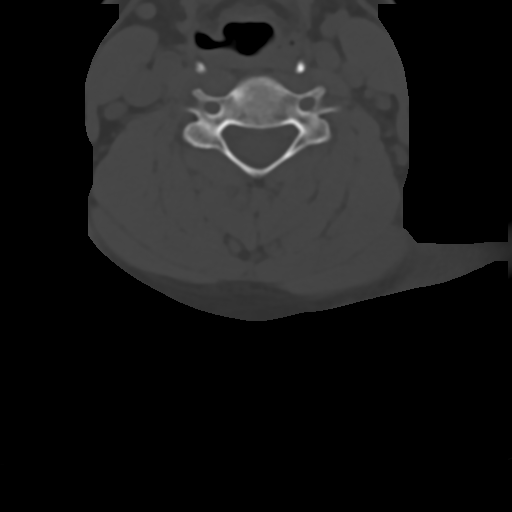
[im 76/102  bone]
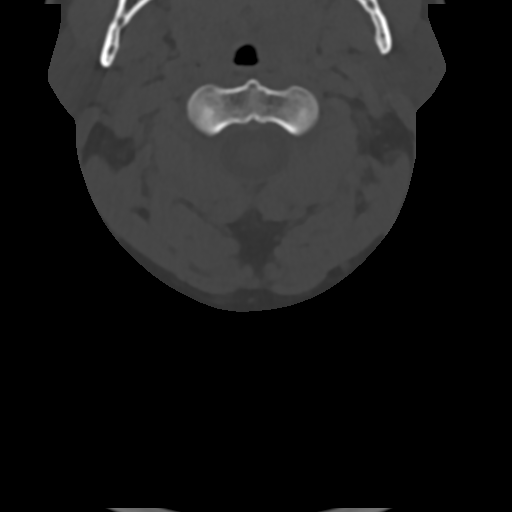

[Series 10: c_spine 2.0 sag bone · sagittal · 0.30mm/px · 5 of 71 slices shown]
[im 12/71  bone]
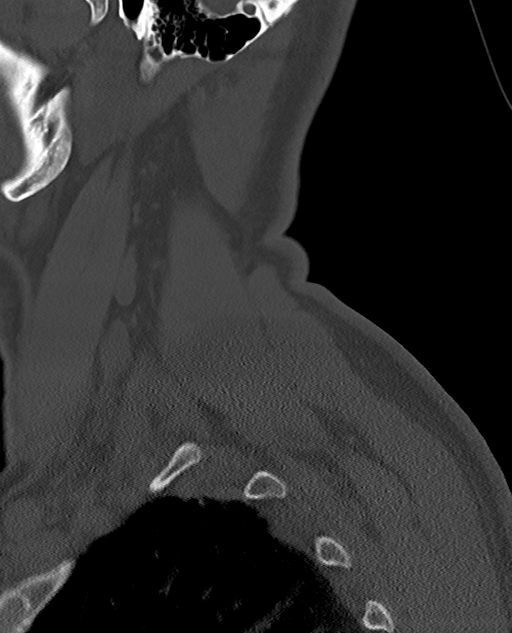
[im 24/71  bone]
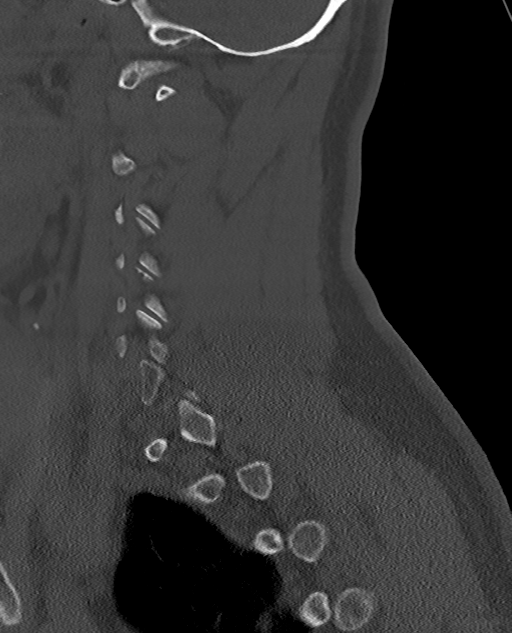
[im 36/71  bone]
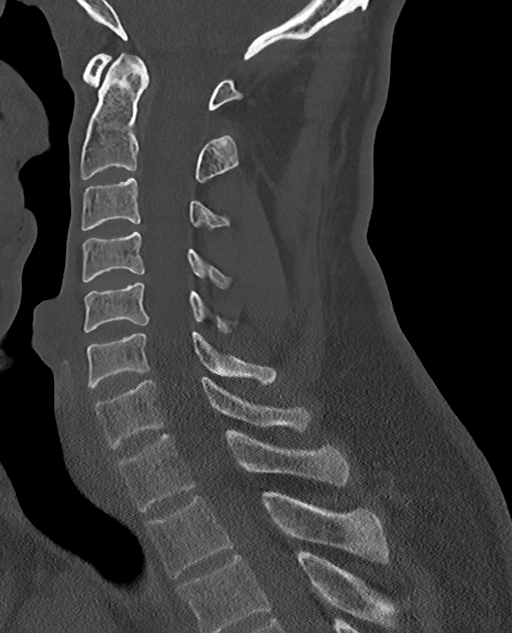
[im 47/71  bone]
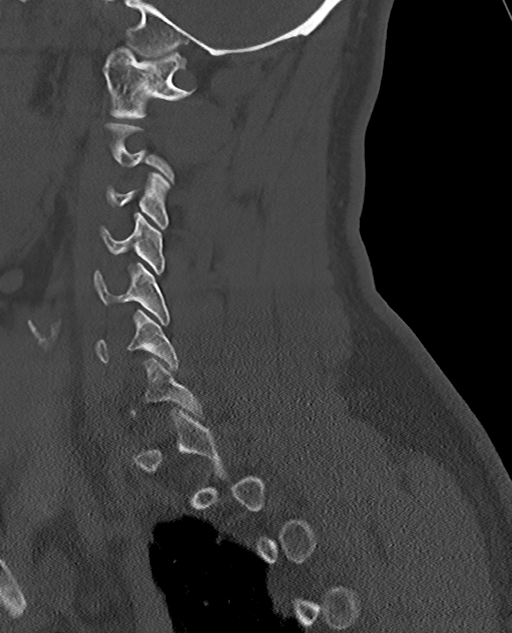
[im 59/71  bone]
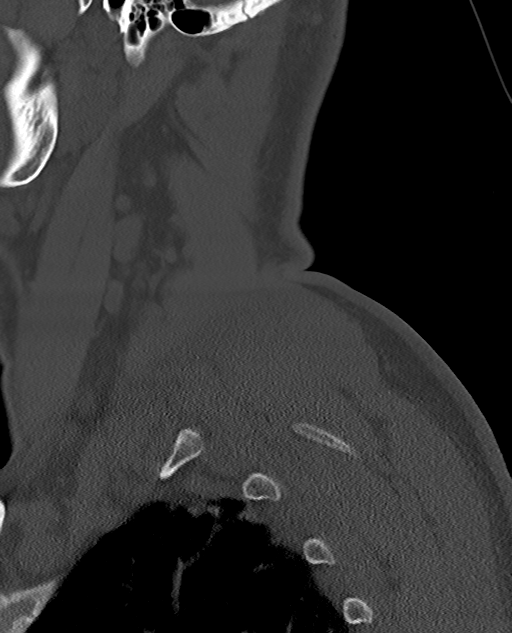

[Series 11: c_spine 2.0 cor bone · coronal · 0.27mm/px · 1 of 75 slices shown]
[im 38/75  bone]
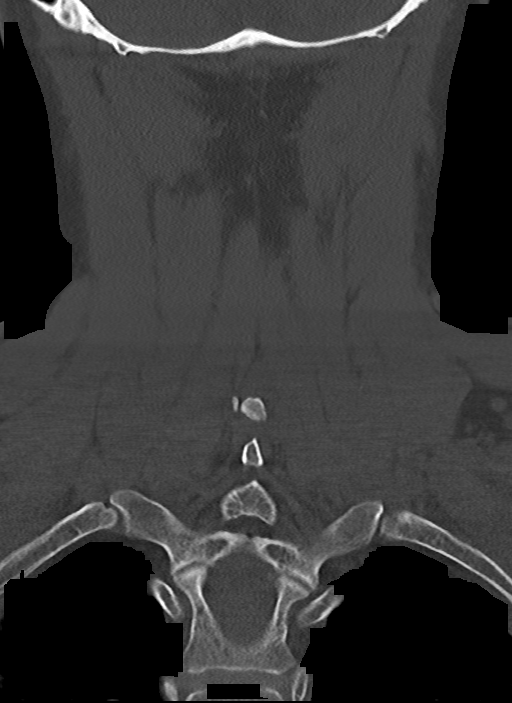

[Series 12: c_spine 2.0 orthogonals · axial · 0.21mm/px · z∈[-293,-197]mm · 3 of 101 slices shown]
[im 26/101  bone]
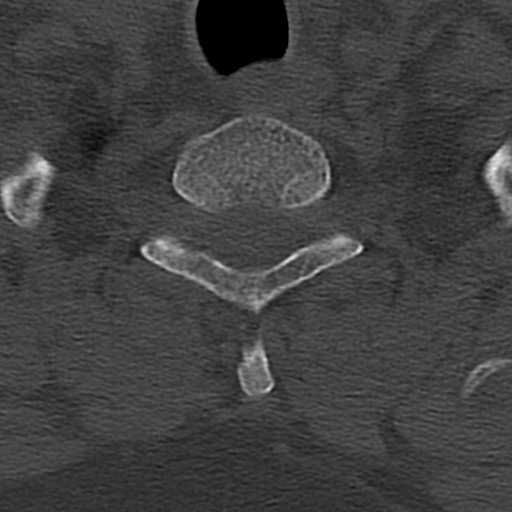
[im 51/101  bone]
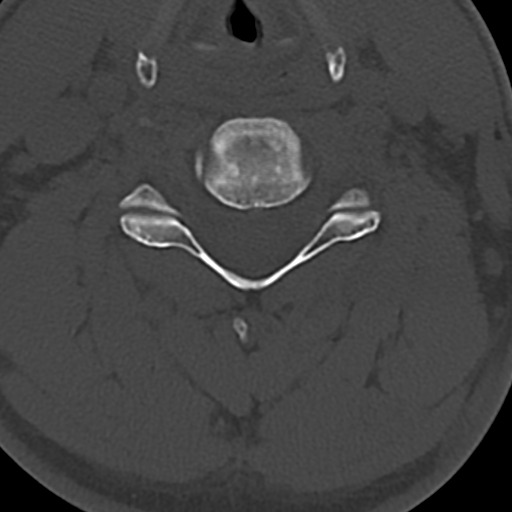
[im 76/101  bone]
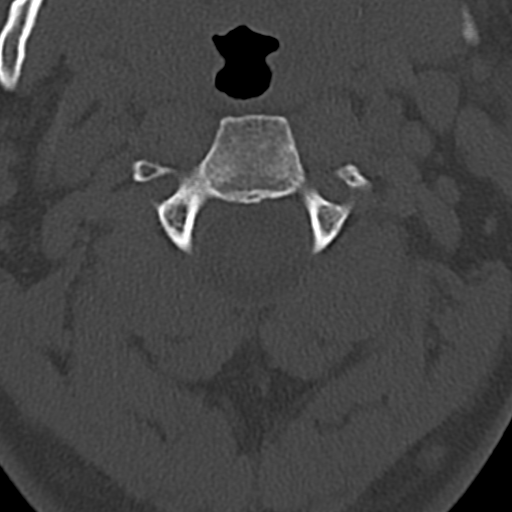

[12 of 33 positions shown; findings below may reference images not displayed]

FINDINGS: CT HEAD FINDINGS

Brain: No evidence of acute infarction, hemorrhage, hydrocephalus,
extra-axial collection or mass lesion/mass effect.

Vascular: No hyperdense vessel or unexpected calcification.

Skull: Normal. Negative for fracture or focal lesion.

Sinuses/Orbits: The bilateral paranasal sinuses and mastoid air
cells are clear. The orbits are unremarkable.

Other: None.

CT CERVICAL SPINE FINDINGS

Alignment: Normal.

Skull base and vertebrae: No acute fracture. No primary bone lesion
or focal pathologic process.

Soft tissues and spinal canal: No prevertebral fluid or swelling. No
visible canal hematoma.

Disc levels:  Normal.

Upper chest: Unchanged 8 mm rim calcified nodule in the left thyroid
gland. Biapical pleuroparenchymal scarring.

Other: None.
IMPRESSION: 1.  No acute intracranial abnormality.
2.  No acute cervical spine fracture.

## 2017-03-19 IMAGING — DX DG PELVIS 1-2 VIEWS
1 series · 1 of 1 positions shown · non-contrast
Comparison: None.

CLINICAL DATA: Fell down stairs today ,tenderness pelvic and chest
arae

EXAM:
PELVIS - 1-2 VIEW

[pelvis ap]
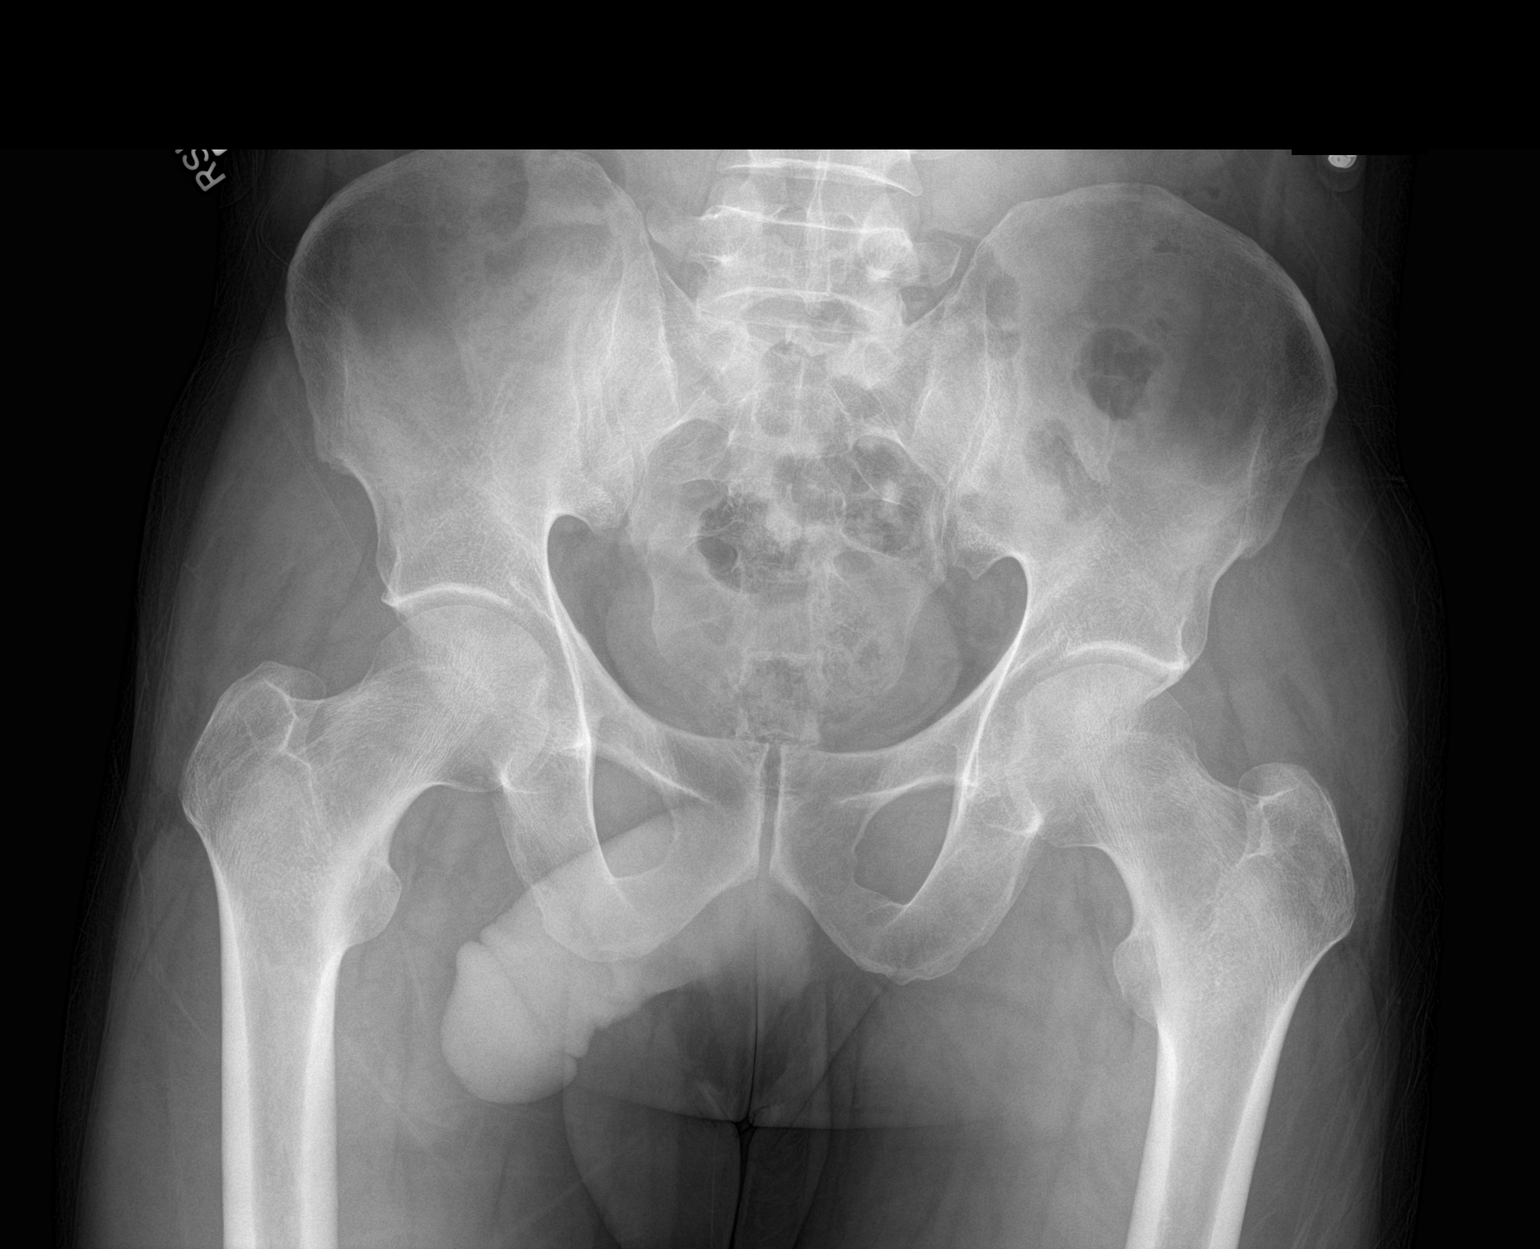

[1 of 1 positions shown; findings below may reference images not displayed]

FINDINGS: No pelvic fracture or sacral fracture.  Hips are located
IMPRESSION: No fracture or dislocation.

## 2017-03-19 IMAGING — CR DG KNEE COMPLETE 4 VIEWS RIGHT
4 series · 4 of 4 positions shown · non-contrast
Comparison: None.

CLINICAL DATA: Fall

EXAM:
RIGHT KNEE - COMPLETE 4+ VIEW

[knee ap]
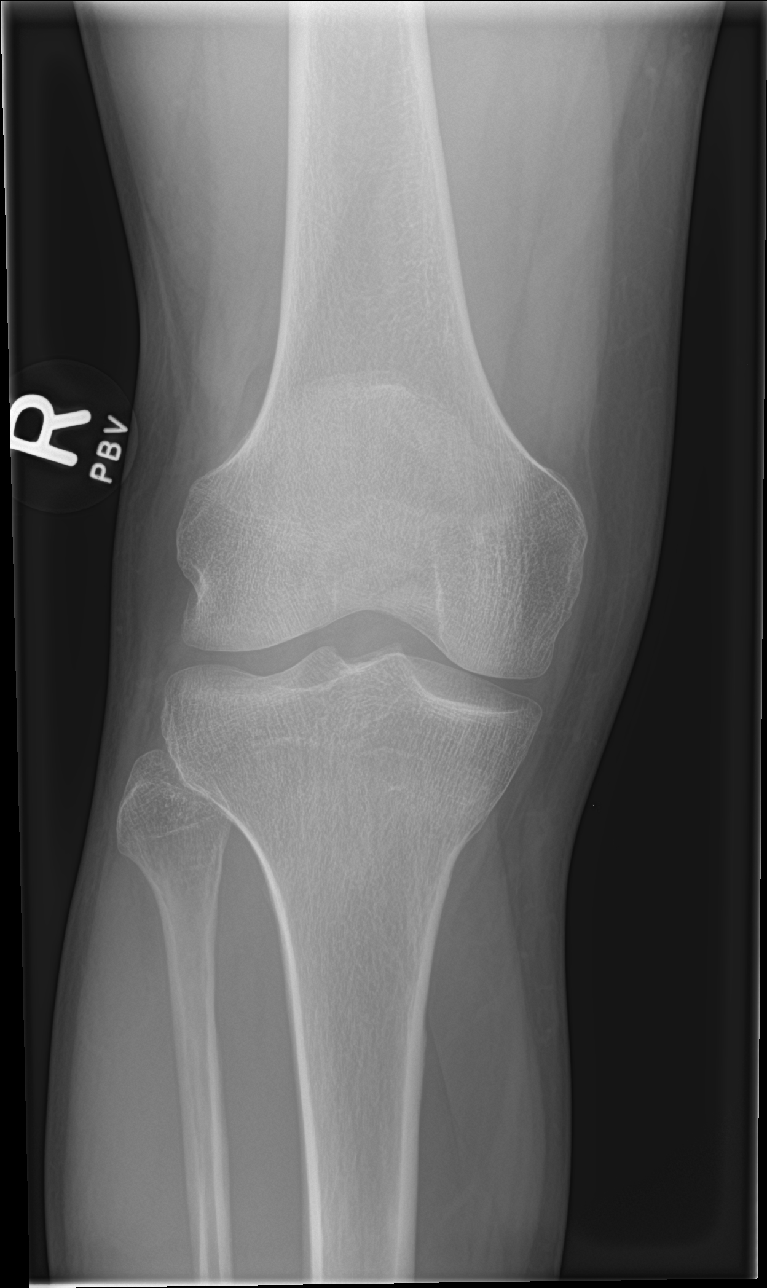

[knee lat]
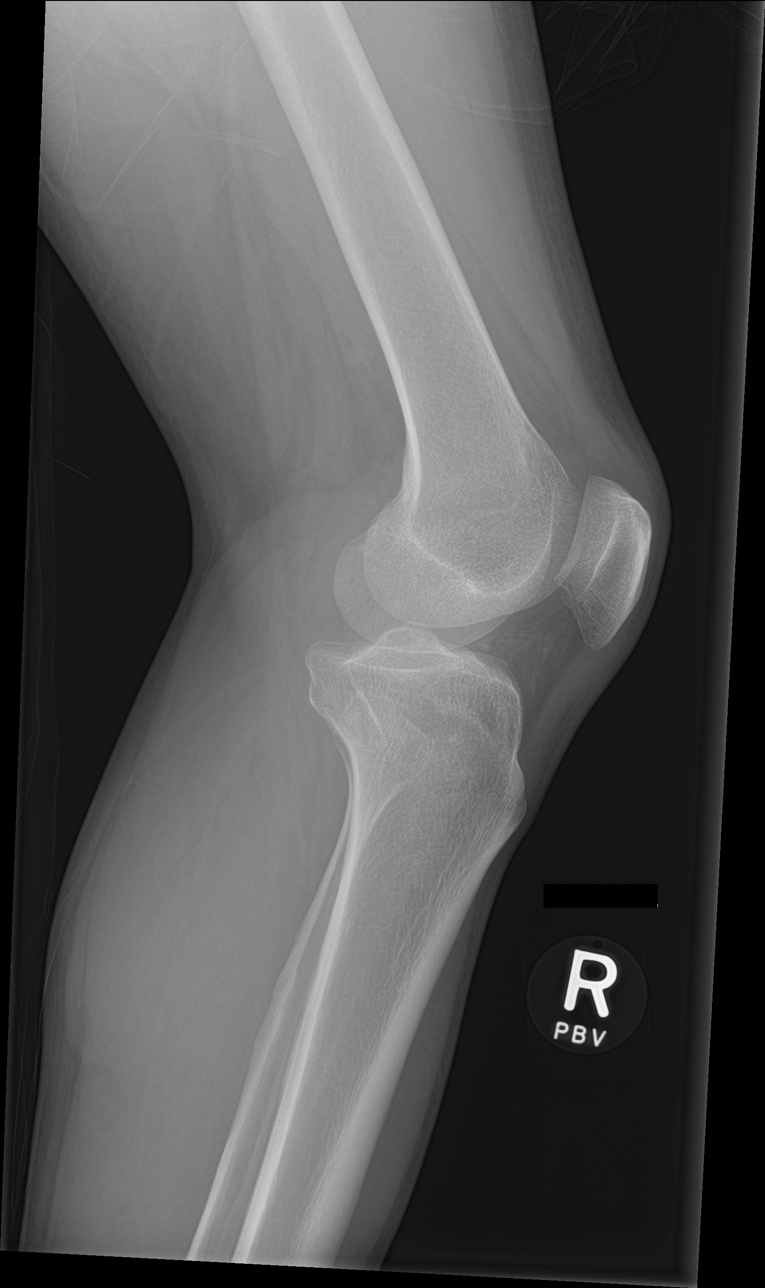

[knee obl (1 of 2)]
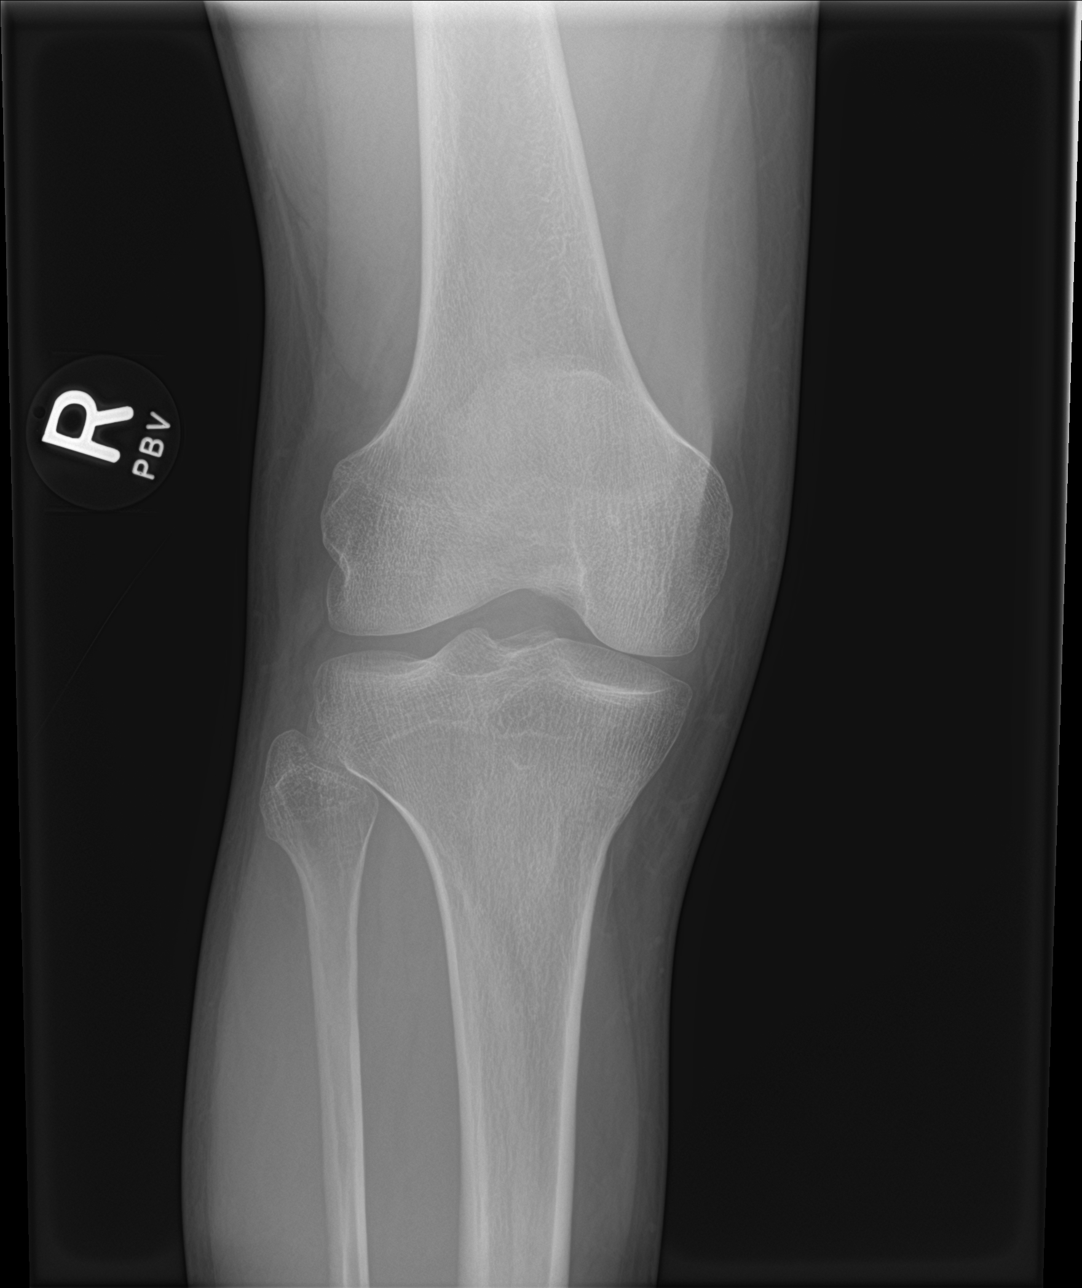

[knee obl (2 of 2)]
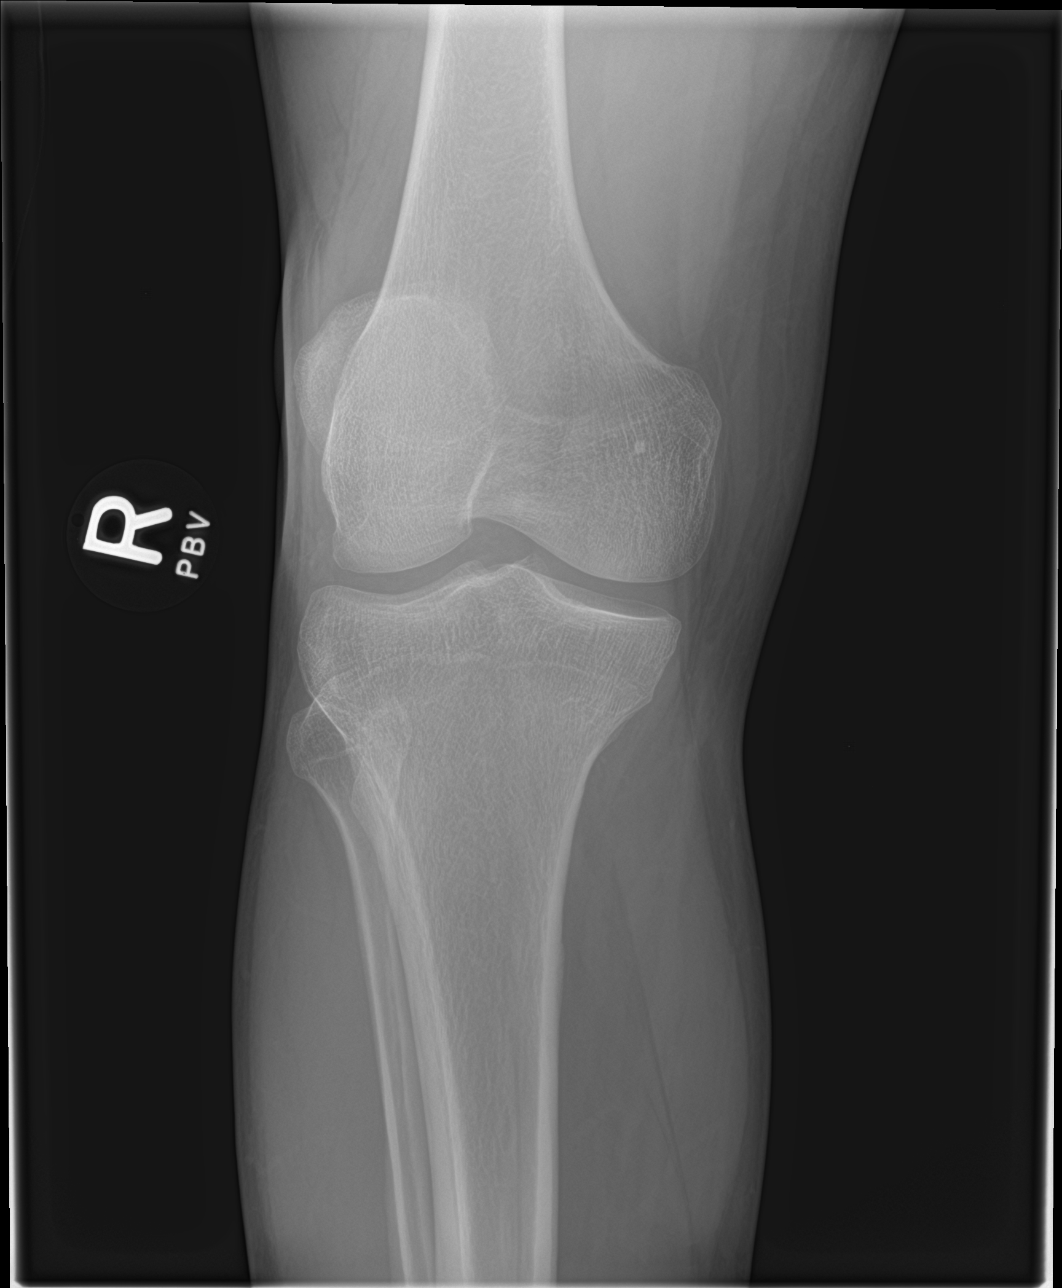

[4 of 4 positions shown; findings below may reference images not displayed]

FINDINGS: No evidence of fracture, dislocation, or joint effusion. No evidence
of arthropathy or other focal bone abnormality. Soft tissues are
unremarkable.
IMPRESSION: Normal right knee.

## 2017-03-19 IMAGING — CR DG ANKLE COMPLETE RIGHT
3 series · 3 of 3 positions shown · non-contrast
Comparison: None.

CLINICAL DATA: Fall

EXAM:
LEFT ANKLE COMPLETE - 3+ VIEW

[ankle ap]
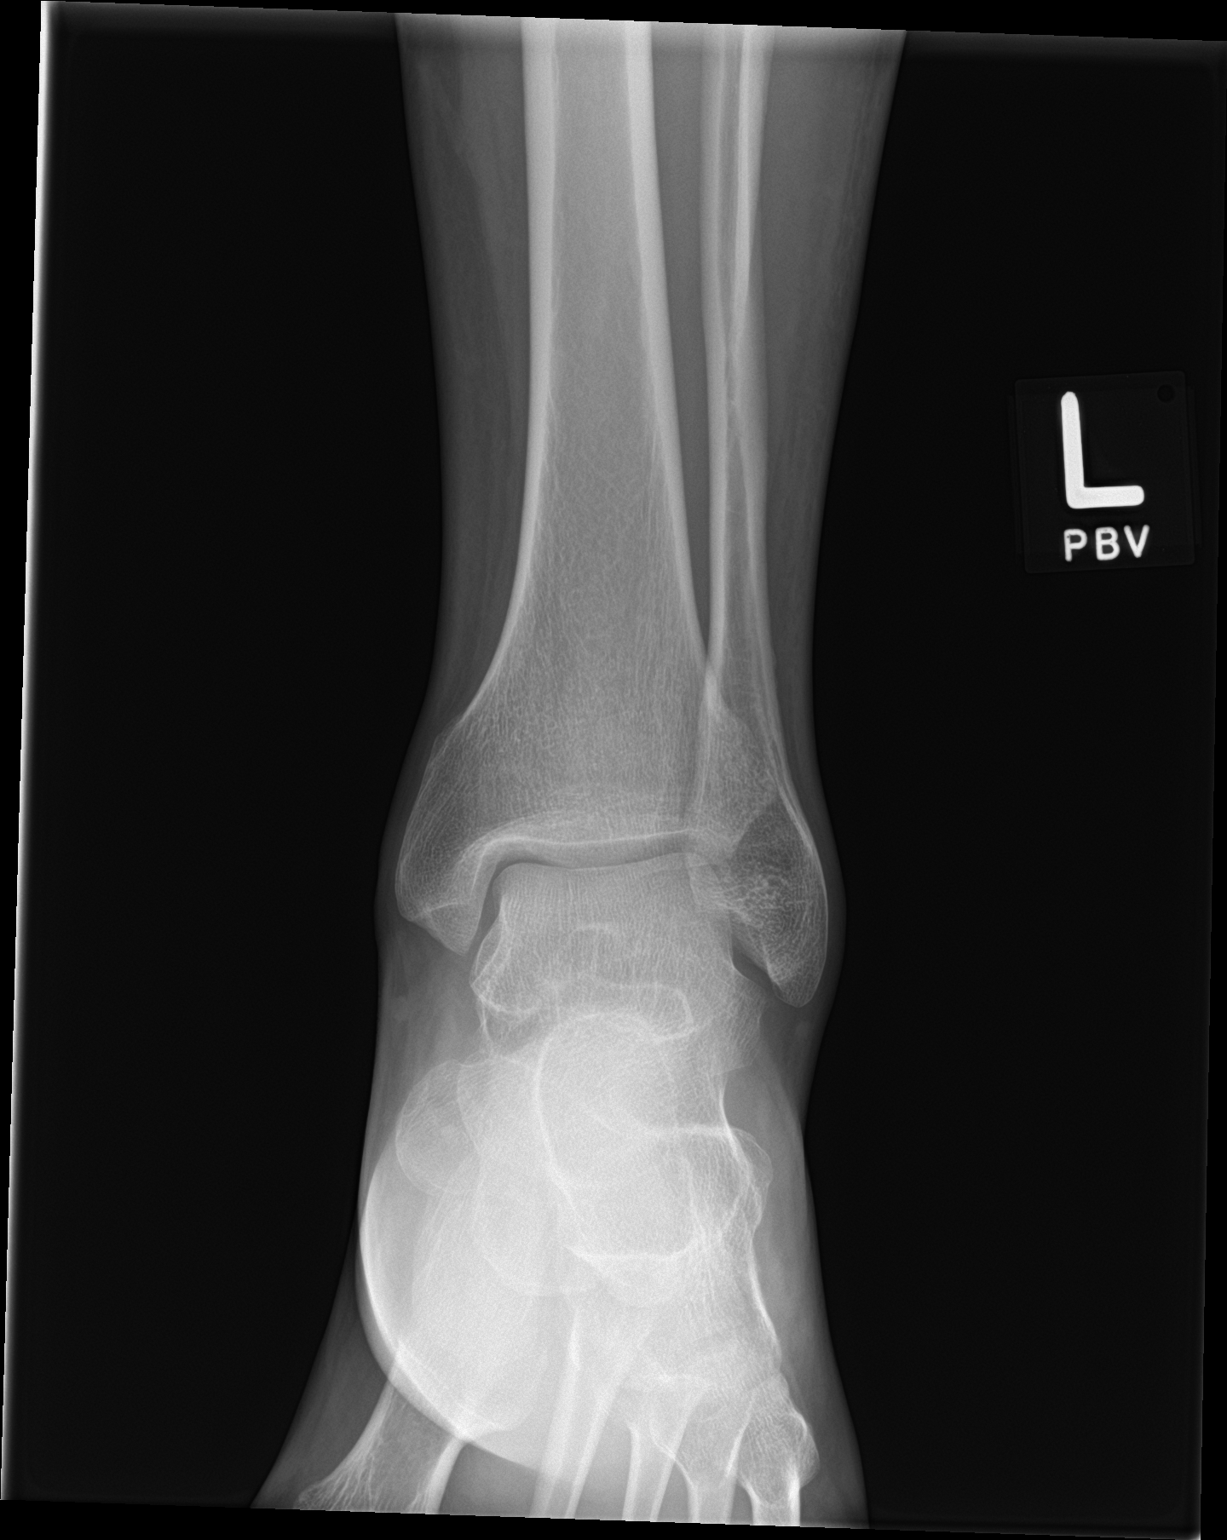

[ankle obl]
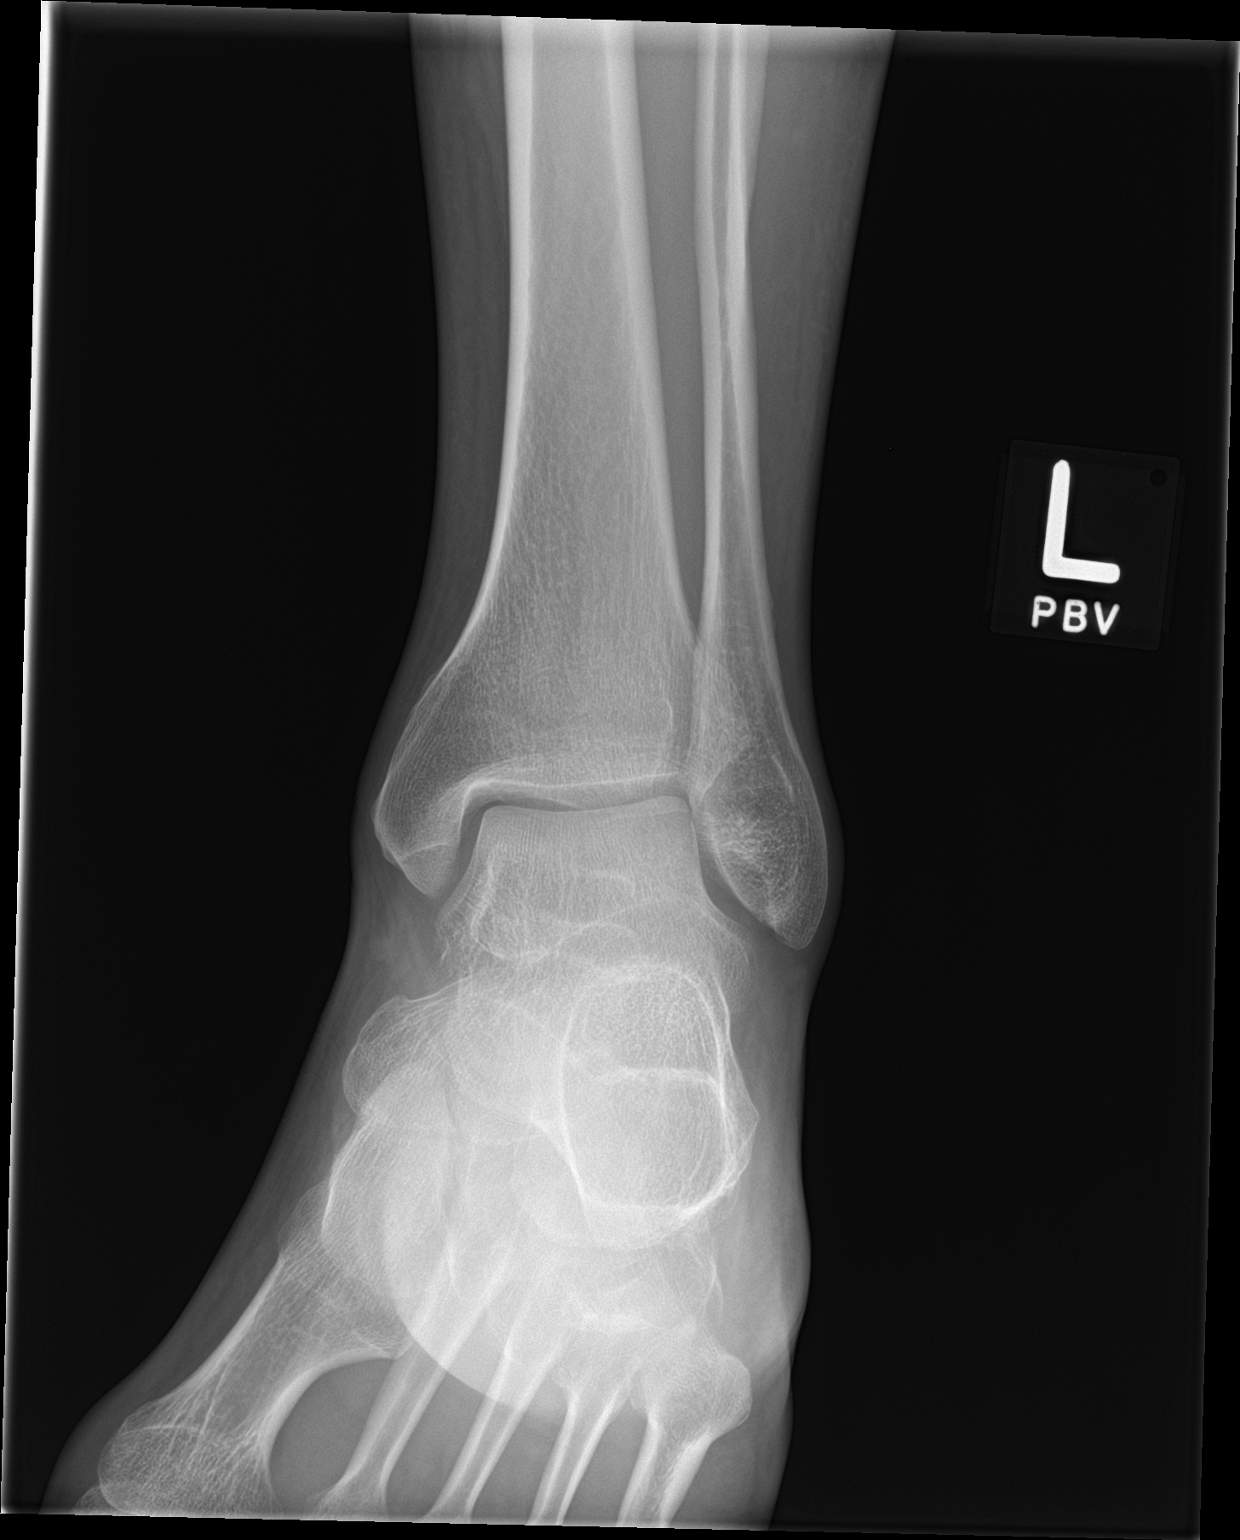

[ankle lat]
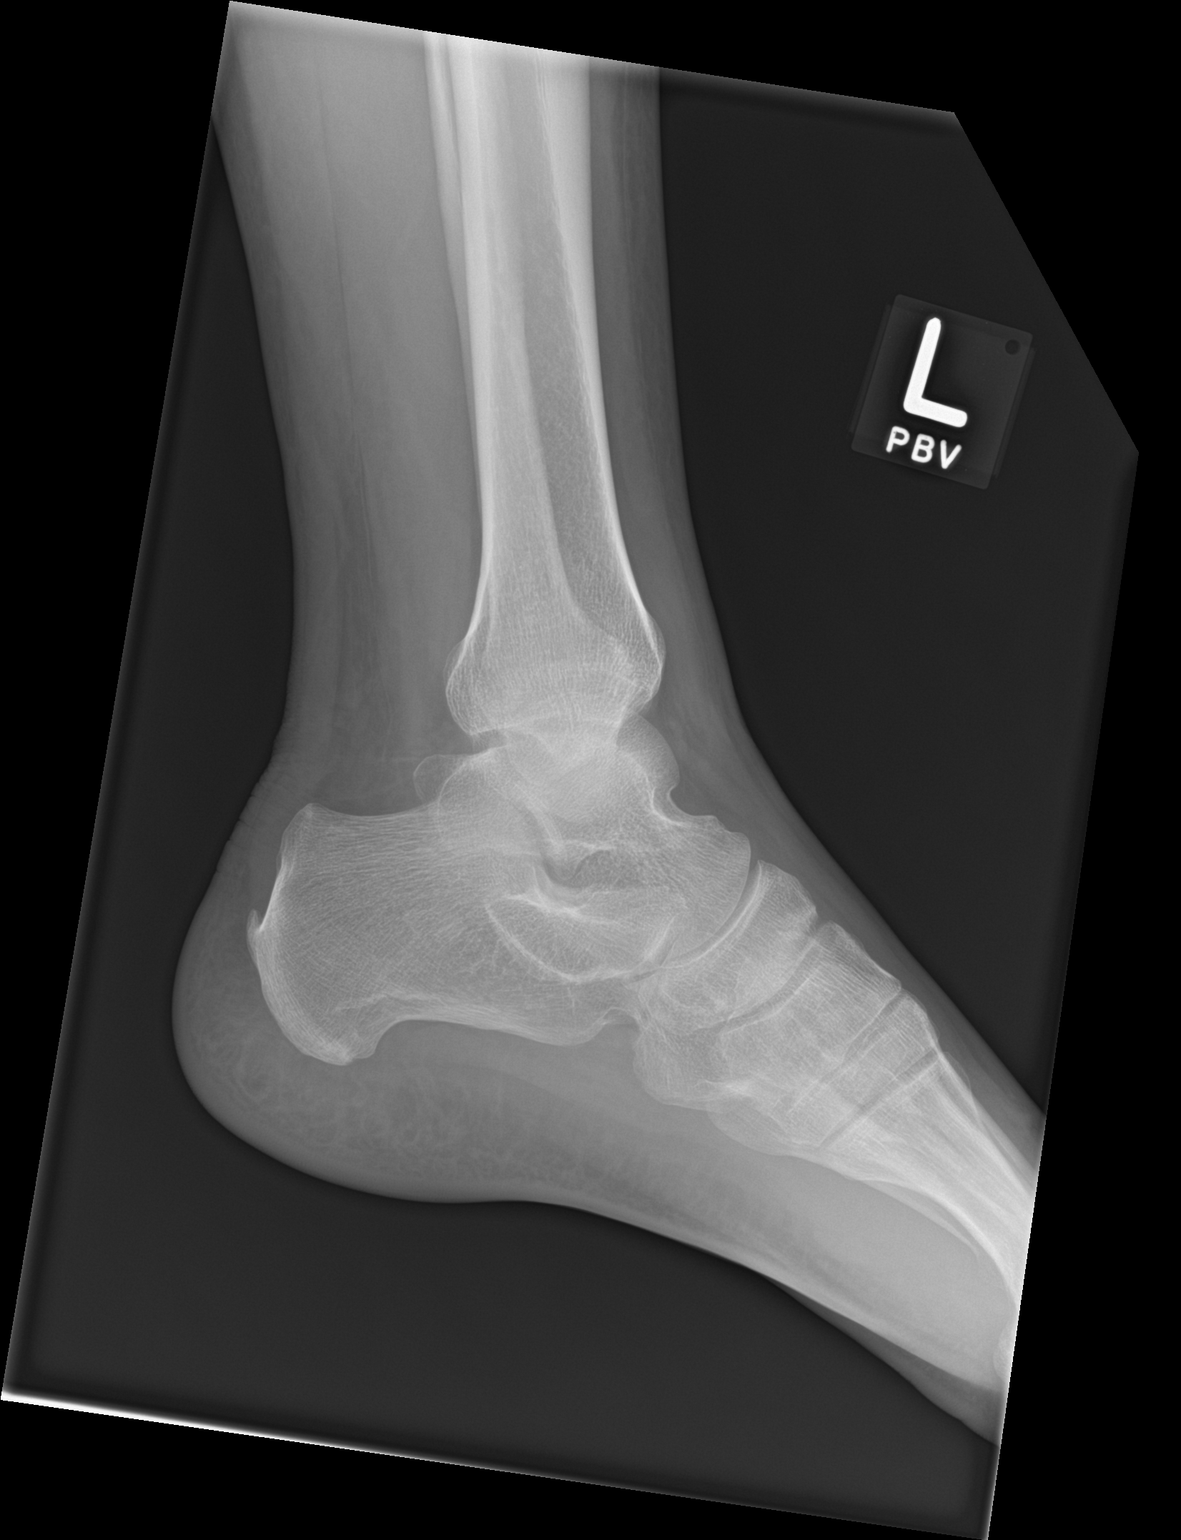

[3 of 3 positions shown; findings below may reference images not displayed]

FINDINGS: There is no evidence of fracture, dislocation, or joint effusion.
There is no evidence of arthropathy or other focal bone abnormality.
Soft tissues are unremarkable.
IMPRESSION: Negative.

## 2017-03-19 IMAGING — CR DG ANKLE COMPLETE LEFT
3 series · 3 of 3 positions shown · non-contrast
Comparison: None.

CLINICAL DATA: Pain following fall

EXAM:
RIGHT ANKLE - COMPLETE 3+ VIEW

[ankle ap]
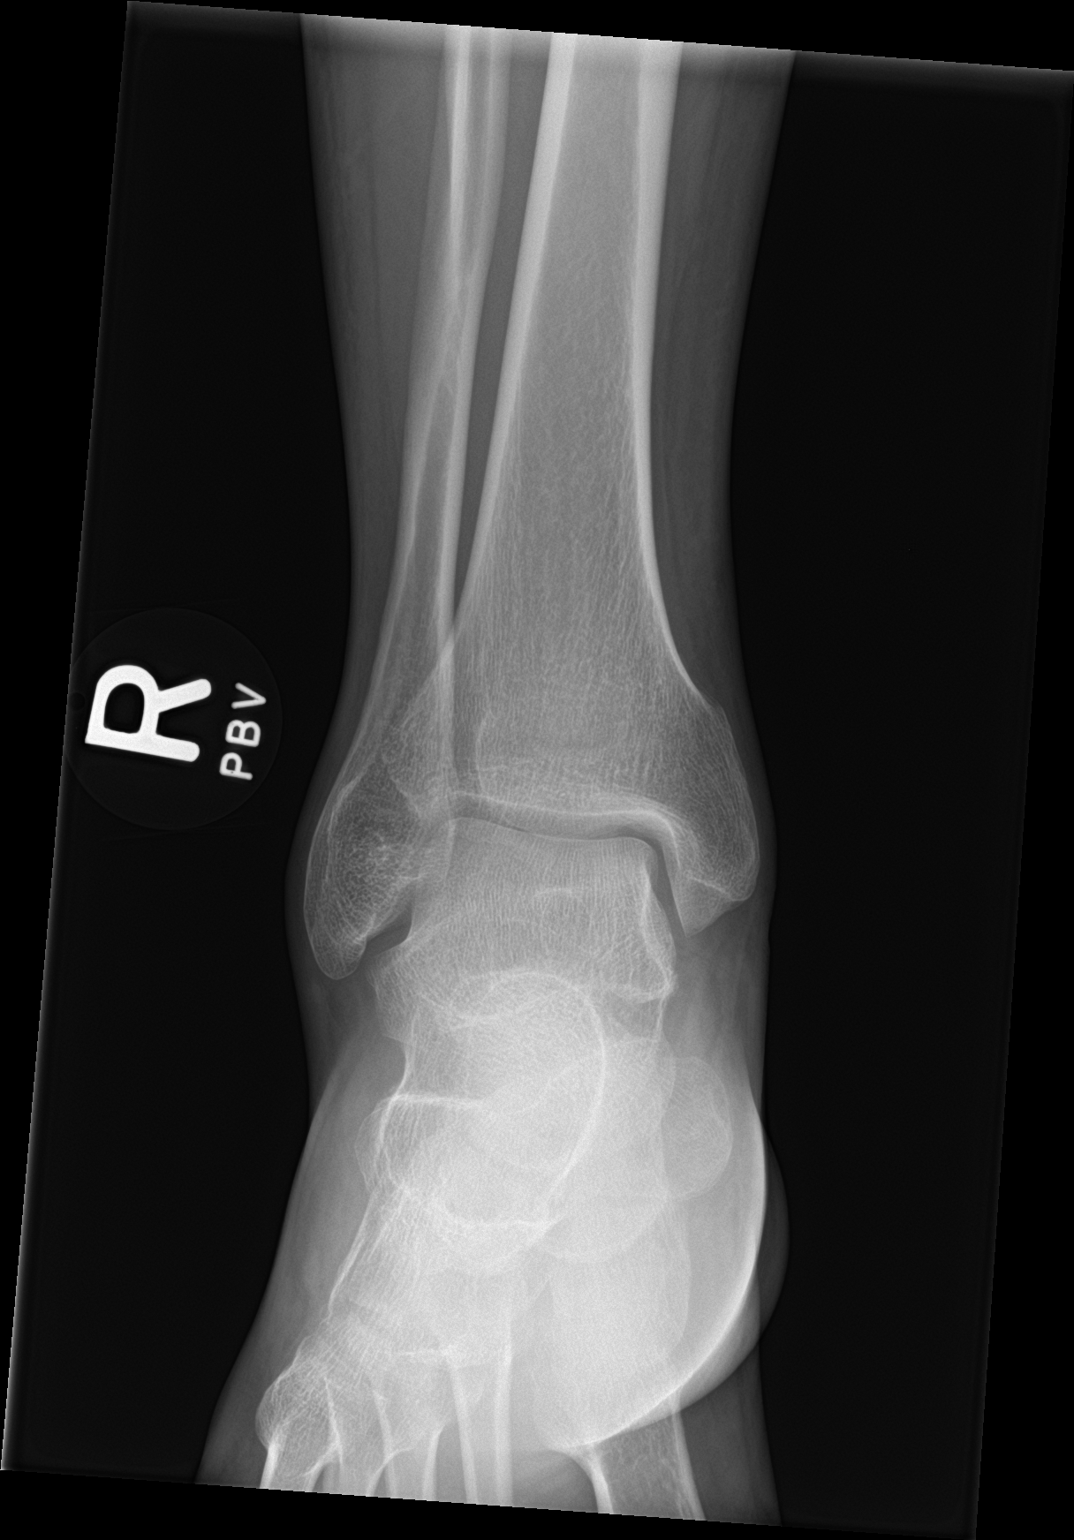

[ankle obl]
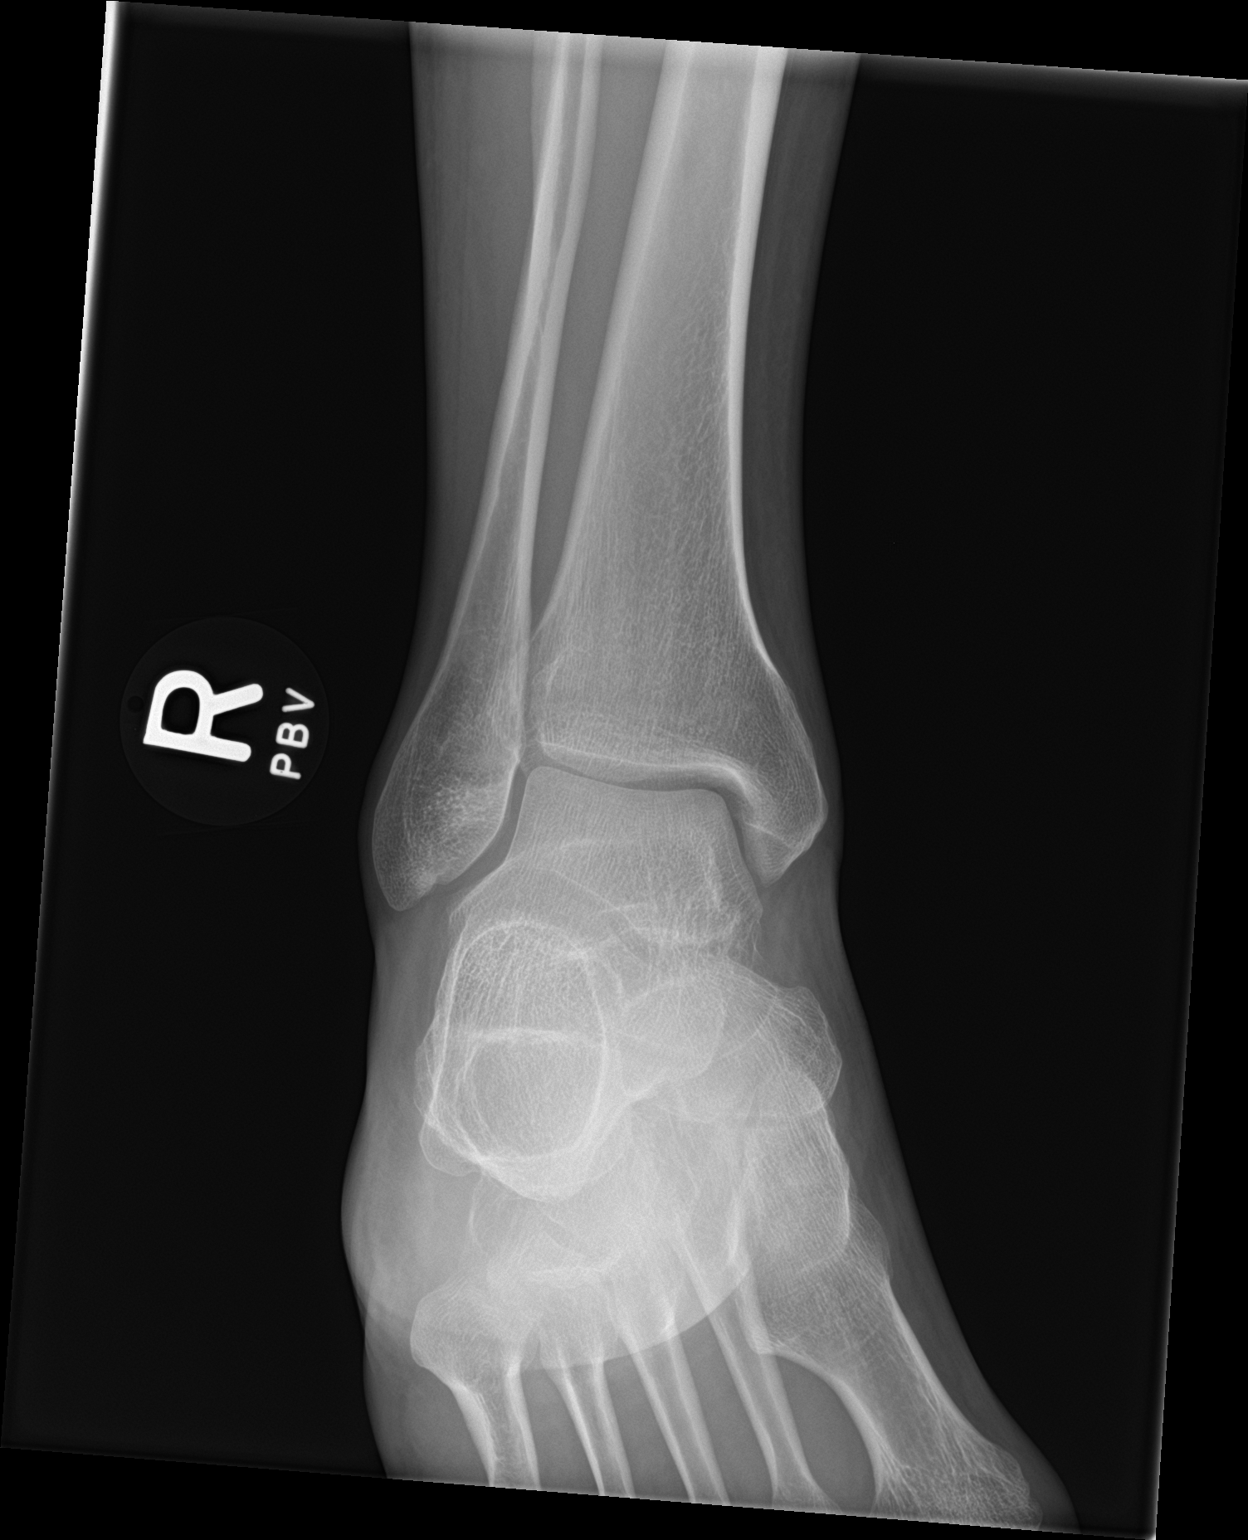

[ankle lat]
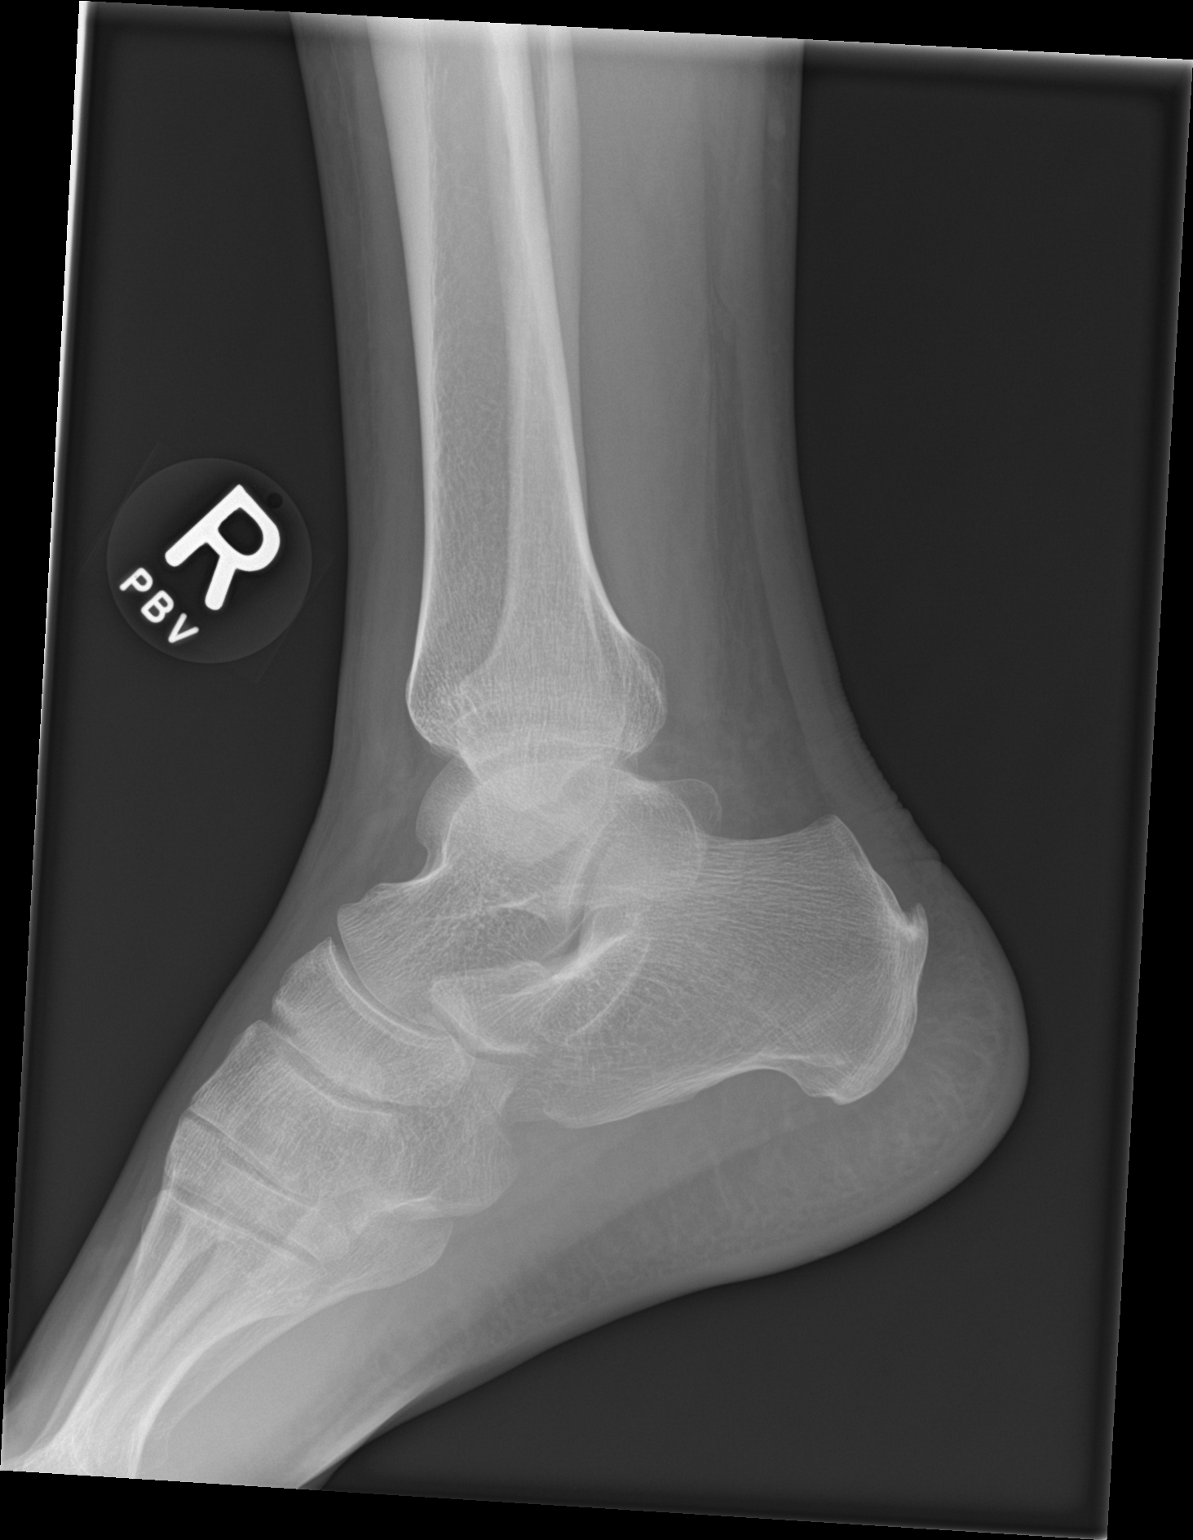

[3 of 3 positions shown; findings below may reference images not displayed]

FINDINGS: Frontal, oblique, and lateral views were obtained. There is no
evident fracture or joint effusion. There is no appreciable joint
space narrowing or erosion. The ankle mortise appears intact. There
is a spur arising from the posterior calcaneus.
IMPRESSION: Small posterior calcaneal spur. No evident fracture or arthropathy.
Ankle mortise appears intact.

## 2017-03-19 MED ORDER — PROCHLORPERAZINE EDISYLATE 5 MG/ML IJ SOLN
5.0000 mg | Freq: Once | INTRAMUSCULAR | Status: AC
  Administered 2017-03-19: 5 mg via INTRAVENOUS
  Filled 2017-03-19: qty 2

## 2017-03-19 MED ORDER — KETOROLAC TROMETHAMINE 30 MG/ML IJ SOLN
30.0000 mg | Freq: Once | INTRAMUSCULAR | Status: AC
  Administered 2017-03-19: 30 mg via INTRAVENOUS
  Filled 2017-03-19: qty 1

## 2017-03-19 MED ORDER — SODIUM CHLORIDE 0.9 % IV BOLUS (SEPSIS)
1000.0000 mL | Freq: Once | INTRAVENOUS | Status: AC
  Administered 2017-03-19: 1000 mL via INTRAVENOUS

## 2017-03-19 MED ORDER — DIPHENHYDRAMINE HCL 50 MG/ML IJ SOLN
25.0000 mg | Freq: Once | INTRAMUSCULAR | Status: AC
  Administered 2017-03-19: 25 mg via INTRAVENOUS
  Filled 2017-03-19: qty 1

## 2017-03-19 MED ORDER — MORPHINE SULFATE (PF) 4 MG/ML IV SOLN
4.0000 mg | Freq: Once | INTRAVENOUS | Status: AC
  Administered 2017-03-19: 4 mg via INTRAVENOUS
  Filled 2017-03-19: qty 1

## 2017-03-19 MED ORDER — MECLIZINE HCL 25 MG PO TABS
25.0000 mg | ORAL_TABLET | Freq: Once | ORAL | Status: AC
  Administered 2017-03-19: 25 mg via ORAL
  Filled 2017-03-19: qty 1

## 2017-03-19 NOTE — Discharge Instructions (Signed)
Return to the emergency department with any concerning symptoms. Follow up with your doctor in 2-3 days for recheck.

## 2017-03-19 NOTE — ED Notes (Signed)
Pt's CBG result was 118. Informed Jamie - RN.

## 2017-03-19 NOTE — ED Triage Notes (Signed)
Pt brought in by [facility] from [place] for refugees. Pt was found lying at the bottom of a stairwell. Pt hyperventilating upon arrival for EMS. No deformity seen.

## 2017-03-19 NOTE — ED Notes (Signed)
Pt continues to not speak in words but only motion with hands.

## 2017-03-19 NOTE — ED Notes (Signed)
Family at beside, pt mumbled few words. Otherwise not verbal with family either. Pt will move hands to motion things only.

## 2017-03-19 NOTE — ED Notes (Signed)
ED Provider at bedside. 

## 2017-03-19 NOTE — ED Notes (Signed)
Attempted to collect urine, pt unable to go at this time.

## 2017-03-19 NOTE — ED Notes (Signed)
Pt not speaking to PA, nurse, or ipad translator, will motion towards knees and chest. Move all extremities.

## 2017-03-19 NOTE — ED Provider Notes (Signed)
Patient signed out at end of shift by Glenford Bayley, PA-C. Patient seen after fall down 5 steps. Negative work up, however, patient developed a headache and dizziness. Suspect concussion from injury.   Symptoms addressed in the ED. Last given Meclizine for dizziness. The patient reports he feels better. He is ambulatory and reports this is improved as well. He is felt stable for discharge per plan of previous treatment team.   Elpidio Anis, PA-C 03/19/17 2246    Mancel Bale, MD 03/19/17 2326

## 2017-03-19 NOTE — ED Provider Notes (Signed)
[facility]EM[facility]ENCY DEPT Provider Note   CSN: [id number] Arrival date & time: 03/19/17  1155     History   Chief Complaint Chief Complaint  Patient presents with  . Fall    HPI Aaron Reyes is a 41 y.o. male with history of noncardiac chest pain, GERD, sensorineural hearing loss, anxiety.  Level V caveat  Per EMS, patient found down at the bottom of a flight of stairs. Patient did not respond to translator on the scene. Patient signaling that he is unable to talk. He signal that he was dizzy and then fell down. We are unsure is patient felt completely on the stairs and felt dizzy at the bottom and then fell from ground level. Patient complaining of chest pain, although this is chronic for the patient per chart review. He is also complaining of head pain. He also reports bilateral knee pain. H&P difficult due to patient not speaking. He infers that he has been unable to talk since the fall.   HPI  Past Medical History:  Diagnosis Date  . GERD (gastroesophageal reflux disease)   . Kidney stone   . Non-cardiac chest pain   . S/P cardiac catheterization 03/2015   cath in [place] and [place], no records,  cath at [facility] normal coronary arteries and normal LV function.    Patient Active Problem List   Diagnosis Date Noted  . Arthralgia of both lower legs 02/21/2017  . Fatigue due to excessive exertion 02/21/2017  . Bilateral sensorineural hearing loss 02/07/2017  . Cervicalgia 10/19/2016  . GERD (gastroesophageal reflux disease) 09/04/2016  . Generalized anxiety disorder 01/26/2016  . Post traumatic stress disorder 01/26/2016  . 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 Back pain 04/26/2015  . Adjustment disorder with anxious mood 04/26/2015  . 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 Dysuria 04/26/2015  . Refugee health examination 03/18/2015  . Housing problems 03/18/2015  . Testicular mass 03/18/2015  . Inguinal hernia 03/18/2015  . Chest pain 02/15/2015  . Hypokalemia  02/15/2015    Past Surgical History:  Procedure Laterality Date  . CARDIAC CATHETERIZATION    . CARDIAC CATHETERIZATION     x 2  . CARDIAC CATHETERIZATION N/A 04/11/2015   Procedure: Left Heart Cath and Coronary Angiography;  Surgeon: Lyn Records, MD;  Location: [facility] INVASIVE CV LAB;  Service: Cardiovascular;  Laterality: N/A;  . HERNIA REPAIR         Home Medications    Prior to Admission medications   Medication Sig Start Date End Date Taking? Authorizing Provider  aspirin EC 81 MG tablet Take 1 tablet (81 mg total) by mouth daily. 04/28/15   Leone Brand, NP  busPIRone (BUSPAR) 15 MG tablet Take 1 tablet (15 mg total) by mouth 3 (three) times daily. 12/10/16   Bing Neighbors, FNP  cyclobenzaprine (FLEXERIL) 10 MG tablet Take 1 tablet (10 mg total) by mouth 3 (three) times daily as needed for muscle spasms. 03/15/17   Leland Her, DO  diazepam (VALIUM) 2 MG tablet Take 1 tablet (2 mg total) by mouth every 4 (four) hours as needed for muscle spasms. 03/23/16   Lorre Nick, MD  escitalopram (LEXAPRO) 20 MG tablet Take 1 tablet (20 mg total) by mouth at bedtime. 12/10/16   Bing Neighbors, FNP  nitroGLYCERIN (NITROSTAT) 0.4 MG SL tablet Place 1 tablet (0.4 mg total) under the tongue every 5 (five) minutes as needed for chest pain. Patient not taking: Reported on 12/10/2016 04/06/15   Annie Paras,  Darcella Gasman, NP  pantoprazole (PROTONIX) 40 MG tablet Take 1 tablet (40 mg total) by mouth daily. 02/20/17   Almon Hercules, MD  traZODone (DESYREL) 100 MG tablet Take 2 tablets (200 mg total) by mouth at bedtime. 12/10/16   Bing Neighbors, FNP    Family History Family History  Problem Relation Age of Onset  . CAD Father   . Heart attack Father   . Hypertension Father   . Stroke Father   . Cancer Mother   . Diabetes Mother   . Hypertension Mother     Social History Social History  Substance Use Topics  . Smoking status: Current Every Day Smoker    Packs/day: 0.25    Types:  Cigarettes  . Smokeless tobacco: Never Used  . Alcohol use No     Allergies   Patient has no known allergies.   Review of Systems Review of Systems  Unable to perform ROS: Patient nonverbal  Cardiovascular: Positive for chest pain.  Neurological: Positive for dizziness and headaches.     Physical Exam Updated Vital Signs BP 104/67   Pulse 65   Resp 13   Ht  (1.651 m)   Wt 64.9 kg (143 lb)   SpO2 96%   BMI 23.80 kg/m   Physical Exam  Constitutional: He appears well-developed and well-nourished. No distress.  HENT:  Head: Normocephalic and atraumatic.  Mouth/Throat: Oropharynx is clear and moist. No oropharyngeal exudate.  Eyes: Pupils are equal, round, and reactive to light. Conjunctivae are normal. Right eye exhibits no discharge. Left eye exhibits no discharge. No scleral icterus.  Neck: Normal range of motion. Neck supple. No thyromegaly present.  Cardiovascular: Normal rate, regular rhythm, normal heart sounds and intact distal pulses.  Exam reveals no gallop and no friction rub.   No murmur heard. Pulmonary/Chest: Effort normal and breath sounds normal. No stridor. No respiratory distress. He has no wheezes. He has no rales.  Abdominal: Soft. Bowel sounds are normal. He exhibits no distension. There is no tenderness. There is no rebound and no guarding.  Musculoskeletal: He exhibits no edema.  No ecchymosis noted throughout Patient taking tenderness of both knees No step-offs, ecchymosis over the spine  Lymphadenopathy:    He has no cervical adenopathy.  Neurological: He is alert. Coordination normal.  Will not talk, even to interpreter. Sensation intact, moving all extremities  Skin: Skin is warm and dry. No rash noted. He is not diaphoretic. No pallor.  Psychiatric: He has a normal mood and affect.  Nursing note and vitals reviewed.    ED Treatments / Results  Labs (all labs ordered are listed, but only abnormal results are displayed) Labs Reviewed    COMPREHENSIVE METABOLIC PANEL - Abnormal; Notable for the following:       Result Value   Glucose, Bld 129 (*)    All other components within normal limits  ACETAMINOPHEN LEVEL - Abnormal; Notable for the following:    Acetaminophen (Tylenol), Serum <10 (*)    All other components within normal limits  CBG MONITORING, ED - Abnormal; Notable for the following:    Glucose-Capillary 118 (*)    All other components within normal limits  I-STAT CHEM 8, ED - Abnormal; Notable for the following:    Creatinine, Ser 0.60 (*)    Glucose, Bld 130 (*)    All other components within normal limits  CBC WITH DIFFERENTIAL/PLATELET  ETHANOL  SALICYLATE LEVEL  TSH  RAPID URINE DRUG SCREEN, HOSP PERFORMED  URINALYSIS, ROUTINE W REFLEX MICROSCOPIC  I-STAT TROPONIN, ED  I-STAT TROPONIN, ED    EKG  EKG Interpretation  Date/Time:  Tuesday March 19 2017 12:11:00 EDT Ventricular Rate:  64 PR Interval:    QRS Duration: 84 QT Interval:  382 QTC Calculation: 395 R Axis:   48 Text Interpretation:  Sinus rhythm Borderline T abnormalities, inferior leads No significant change since last tracing Confirmed by Richardean Canal ([id number]) on 03/19/2017 12:21:32 PM       Radiology Dg Pelvis 1-2 Views  Result Date: 03/19/2017 CLINICAL DATA:  Larey Seat down stairs today ,tenderness pelvic and chest arae EXAM: PELVIS - 1-2 VIEW COMPARISON:  None. FINDINGS: No pelvic fracture or sacral fracture.  Hips are located IMPRESSION: No fracture or dislocation. Electronically Signed   By: Genevive Bi M.D.   On: 03/19/2017 13:09   Dg Ankle Complete Left  Result Date: 03/19/2017 CLINICAL DATA:  Fall EXAM: LEFT ANKLE COMPLETE - 3+ VIEW COMPARISON:  None. FINDINGS: There is no evidence of fracture, dislocation, or joint effusion. There is no evidence of arthropathy or other focal bone abnormality. Soft tissues are unremarkable. IMPRESSION: Negative. Electronically Signed   By: Jasmine Pang M.D.   On: 03/19/2017 14:29   Dg  Ankle Complete Right  Result Date: 03/19/2017 CLINICAL DATA:  Pain following fall EXAM: RIGHT ANKLE - COMPLETE 3+ VIEW COMPARISON:  None. FINDINGS: Frontal, oblique, and lateral views were obtained. There is no evident fracture or joint effusion. There is no appreciable joint space narrowing or erosion. The ankle mortise appears intact. There is a spur arising from the posterior calcaneus. IMPRESSION: Small posterior calcaneal spur. No evident fracture or arthropathy. Ankle mortise appears intact. Electronically Signed   By: Bretta Bang III M.D.   On: 03/19/2017 14:30   Ct Head Wo Contrast  Result Date: 03/19/2017 CLINICAL DATA:  Altered mental status. Concern for fall down stair well. EXAM: CT HEAD WITHOUT CONTRAST CT CERVICAL SPINE WITHOUT CONTRAST TECHNIQUE: Multidetector CT imaging of the head and cervical spine was performed following the standard protocol without intravenous contrast. Multiplanar CT image reconstructions of the cervical spine were also generated. COMPARISON:  CT head and cervical spine dated March 22, 2016. FINDINGS: CT HEAD FINDINGS Brain: No evidence of acute infarction, hemorrhage, hydrocephalus, extra-axial collection or mass lesion/mass effect. Vascular: No hyperdense vessel or unexpected calcification. Skull: Normal. Negative for fracture or focal lesion. Sinuses/Orbits: The bilateral paranasal sinuses and mastoid air cells are clear. The orbits are unremarkable. Other: None. CT CERVICAL SPINE FINDINGS Alignment: Normal. Skull base and vertebrae: No acute fracture. No primary bone lesion or focal pathologic process. Soft tissues and spinal canal: No prevertebral fluid or swelling. No visible canal hematoma. Disc levels:  Normal. Upper chest: Unchanged 8 mm rim calcified nodule in the left thyroid gland. Biapical pleuroparenchymal scarring. Other: None. IMPRESSION: 1.  No acute intracranial abnormality. 2.  No acute cervical spine fracture. Electronically Signed   By: Obie Dredge M.D.   On: 03/19/2017 14:16   Ct Cervical Spine Wo Contrast  Result Date: 03/19/2017 CLINICAL DATA:  Altered mental status. Concern for fall down stair well. EXAM: CT HEAD WITHOUT CONTRAST CT CERVICAL SPINE WITHOUT CONTRAST TECHNIQUE: Multidetector CT imaging of the head and cervical spine was performed following the standard protocol without intravenous contrast. Multiplanar CT image reconstructions of the cervical spine were also generated. COMPARISON:  CT head and cervical spine dated March 22, 2016. FINDINGS: CT HEAD FINDINGS Brain: No evidence of acute infarction, hemorrhage, hydrocephalus,  extra-axial collection or mass lesion/mass effect. Vascular: No hyperdense vessel or unexpected calcification. Skull: Normal. Negative for fracture or focal lesion. Sinuses/Orbits: The bilateral paranasal sinuses and mastoid air cells are clear. The orbits are unremarkable. Other: None. CT CERVICAL SPINE FINDINGS Alignment: Normal. Skull base and vertebrae: No acute fracture. No primary bone lesion or focal pathologic process. Soft tissues and spinal canal: No prevertebral fluid or swelling. No visible canal hematoma. Disc levels:  Normal. Upper chest: Unchanged 8 mm rim calcified nodule in the left thyroid gland. Biapical pleuroparenchymal scarring. Other: None. IMPRESSION: 1.  No acute intracranial abnormality. 2.  No acute cervical spine fracture. Electronically Signed   By: Obie Dredge M.D.   On: 03/19/2017 14:16   Dg Chest Port 1 View  Result Date: 03/19/2017 CLINICAL DATA:  Pain after fall. EXAM: PORTABLE CHEST 1 VIEW COMPARISON:  March 22, 2016 FINDINGS: The heart size and mediastinal contours are within normal limits. Both lungs are clear. The visualized skeletal structures are unremarkable. IMPRESSION: No active disease. Electronically Signed   By: Gerome Sam III M.D   On: 03/19/2017 13:07   Dg Knee Complete 4 Views Left  Result Date: 03/19/2017 CLINICAL DATA:  Pain following fall  EXAM: LEFT KNEE - COMPLETE 4+ VIEW COMPARISON:  None. FINDINGS: Frontal, lateral, and bilateral oblique views were obtained. There is no fracture or dislocation. No joint effusion. Joint spaces appear normal. No erosive change. IMPRESSION: No fracture or dislocation. No joint effusion. No evident arthropathy. Electronically Signed   By: Bretta Bang III M.D.   On: 03/19/2017 14:29   Dg Knee Complete 4 Views Right  Result Date: 03/19/2017 CLINICAL DATA:  Fall EXAM: RIGHT KNEE - COMPLETE 4+ VIEW COMPARISON:  None. FINDINGS: No evidence of fracture, dislocation, or joint effusion. No evidence of arthropathy or other focal bone abnormality. Soft tissues are unremarkable. IMPRESSION: Normal right knee. Electronically Signed   By: Deatra Robinson M.D.   On: 03/19/2017 14:28    Procedures Procedures (including critical care time)  Medications Ordered in ED Medications  sodium chloride 0.9 % bolus 1,000 mL (0 mLs Intravenous Stopped 03/19/17 1453)  morphine 4 MG/ML injection 4 mg (4 mg Intravenous Given 03/19/17 1237)  sodium chloride 0.9 % bolus 1,000 mL (0 mLs Intravenous Stopped 03/19/17 1817)  ketorolac (TORADOL) 30 MG/ML injection 30 mg (30 mg Intravenous Given 03/19/17 1630)  prochlorperazine (COMPAZINE) injection 5 mg (5 mg Intravenous Given 03/19/17 1630)  diphenhydrAMINE (BENADRYL) injection 25 mg (25 mg Intravenous Given 03/19/17 1631)  meclizine (ANTIVERT) tablet 25 mg (25 mg Oral Given 03/19/17 2004)     Initial Impression / Assessment and Plan / ED Course  I have reviewed the triage vital signs and the nursing notes.  Pertinent labs & imaging results that were available during my care of the patient were reviewed by me and considered in my medical decision making (see chart for details).  Clinical Course as of Mar 20 2011  Tue Mar 19, 2017  4540 Patient complaining of headache. Given migraine cocktail and reports he is still feeling lightheaded and like he is on a boat. Will give  meclizine. Suspect patient may have a concussion from fall. Patient now talking normally. He reports that he fell down about 5 stairs. He did not feel dizzy before he fell and has only felt disease since his fall.  [AL]    Clinical Course User Index [AL] Emi Holes, PA-C    Patient presents following a fall down 5 stairs. He  denies feeling dizzy prior. Labs are unremarkable. Dr. troponin negative. UA, UDS pending. CT head, C-spine, bilateral knees and ankle, chest, pelvis negative. Patient's headache improved after migraine cocktail, however complaining of dizziness. We'll give meclizine. Suspect patient may have sustained a concussion. At shift change, patient care transferred to Elpidio Anis, PA-C for continued evaluation, follow up of ambulation following meclizine and determination of disposition. Anticipate discharge with meclizine if able to ambulate without difficulty. Plan to follow up with concussion clinic and PCP.  Final Clinical Impressions(s) / ED Diagnoses   Final diagnoses:  Fall    New Prescriptions New Prescriptions   No medications on file     Verdis Prime 03/19/17 2012    Charlynne Pander, MD 03/21/17 [phone]

## 2017-03-23 NOTE — Congregational Nurse Program (Signed)
Congregational Nurse Program Note  Date of Encounter: 03/19/2017  Past Medical History: Past Medical History:  Diagnosis Date  . GERD (gastroesophageal reflux disease)   . Kidney stone   . Non-cardiac chest pain   . S/P cardiac catheterization 03/2015   cath in [place] and [place], no records,  cath at [facility] normal coronary arteries and normal LV function.    Encounter Details:     CNP Questionnaire - 03/19/17 1430      Patient Demographics   Is this a new or existing patient? Existing   Patient is considered a/an Refugee   Race Asian     Patient Assistance   Location of Patient Assistance Not Applicable   Patient's financial/insurance status Low Income;Medicaid   Uninsured Patient ([employer], trade union) No   Patient referred to apply for the following financial assistance Not Applicable   Food insecurities addressed Not Applicable   Transportation assistance Yes   Type of Assistance Other   Assistance securing medications No   Educational health offerings Not Applicable     Encounter Details   Primary purpose of visit Other   Was an Emergency Department visit averted? No   Does patient have a medical provider? Yes   Patient referred to Emergency Department   Was a mental health screening completed? (GAINS tool) No   Does patient have dental issues? No   Was a dental referral made? No   Does patient have vision issues? No   Does your patient have an abnormal blood pressure today? No   Since previous encounter, have you referred patient for abnormal blood pressure that resulted in a new diagnosis or medication change? No   Does your patient have an abnormal blood glucose today? No   Since previous encounter, have you referred patient for abnormal blood glucose that resulted in a new diagnosis or medication change? No   Was there a life-saving intervention made? Yes         Clinical Intake - 03/15/17 1605      Pre-visit preparation   Pre-visit preparation  completed Yes     Abuse/Neglect   Do you feel unsafe in your current relationship? No   Do you feel physically threatened by others? No   Anyone hurting you at home, work, or school? No     Patient Literacy   How often do you need to have someone help you when you read instructions, pamphlets, or other written materials from your doctor or pharmacy? 3 - Sometimes   What is the last grade level you completed in school? high school     Language Assistant   Interpreter Needed? Yes   Interpreter Agency [employer]   Interpreter Name hassan   Interpreter ID [phone]    Brief visit to visit patient and spouse in [facility]. Responsive to visit with hand gestures. Apparently drowsy and awaiting additional evaluation. Assisted spouse with transportation if needed in returning home with children. Follow-up visit planned with family for support and navigation of medical appointments. Ferol Luz, RN/CN

## 2017-03-23 NOTE — Congregational Nurse Program (Signed)
Congregational Nurse Program Note  Date of Encounter: 03/21/2017  Past Medical History: Past Medical History:  Diagnosis Date  . GERD (gastroesophageal reflux disease)   . Kidney stone   . Non-cardiac chest pain   . S/P cardiac catheterization 03/2015   cath in [place] and [place], no records,  cath at [facility] normal coronary arteries and normal LV function.    Encounter Details:     CNP Questionnaire - 03/21/17 1315      Patient Demographics   Is this a new or existing patient? Existing   Patient is considered a/an Refugee   Race Asian     Patient Assistance   Location of Patient Assistance Not Applicable   Patient's financial/insurance status Low Income;Medicaid   Uninsured Patient ([employer], trade union) No   Patient referred to apply for the following financial assistance Not Applicable   Food insecurities addressed Not Applicable   Transportation assistance Yes   Type of Assistance Other   Assistance securing medications No   Educational health offerings Not Applicable     Encounter Details   Primary purpose of visit Other   Was an Emergency Department visit averted? No   Does patient have a medical provider? Yes   Patient referred to Emergency Department   Was a mental health screening completed? (GAINS tool) No   Does patient have dental issues? No   Was a dental referral made? No   Does patient have vision issues? No   Does your patient have an abnormal blood pressure today? No   Since previous encounter, have you referred patient for abnormal blood pressure that resulted in a new diagnosis or medication change? No   Does your patient have an abnormal blood glucose today? No   Since previous encounter, have you referred patient for abnormal blood glucose that resulted in a new diagnosis or medication change? No   Was there a life-saving intervention made? Yes         Clinical Intake - 03/15/17 1605      Pre-visit preparation   Pre-visit preparation  completed Yes     Abuse/Neglect   Do you feel unsafe in your current relationship? No   Do you feel physically threatened by others? No   Anyone hurting you at home, work, or school? No     Patient Literacy   How often do you need to have someone help you when you read instructions, pamphlets, or other written materials from your doctor or pharmacy? 3 - Sometimes   What is the last grade level you completed in school? high school     Language Assistant   Interpreter Needed? Yes   Designer, jewellery   Interpreter Name hassan   Interpreter ID [phone]    Home visit to follow-up condition and speak to spouse regarding support through agency's social worker staff. Family known to agency and familiar with staff. Spouse present with two youngest children during visit and reported patient preparing for follow-up appointment with physician today for re-evaluation of injury on 10/02. Transportation to medical appointment provided by friend today per spouse. Receptive to visit.  Appointment scheduled for 03/26/17 at [facility] with spouse for support and follow-up of family. Communication in Arabic via phone interpretation site. Ferol Luz, RN/CN

## 2017-03-23 NOTE — Congregational Nurse Program (Signed)
Congregational Nurse Program Note  Date of Encounter: 03/19/2017  Past Medical History: Past Medical History:  Diagnosis Date  . GERD (gastroesophageal reflux disease)   . Kidney stone   . Non-cardiac chest pain   . S/P cardiac catheterization 03/2015   cath in [place] and [place], no records,  cath at [facility] normal coronary arteries and normal LV function.    Encounter Details:     CNP Questionnaire - 03/19/17 1115      Patient Demographics   Is this a new or existing patient? Existing   Patient is considered a/an Refugee   Race Asian     Patient Assistance   Location of Patient Assistance Not Applicable   Patient's financial/insurance status Low Income;Medicaid   Uninsured Patient ([employer], trade union) No   Patient referred to apply for the following financial assistance Not Applicable   Food insecurities addressed Not Applicable   Transportation assistance Yes   Type of Assistance Other   Assistance securing medications No   Educational health offerings Not Applicable     Encounter Details   Primary purpose of visit Other   Was an Emergency Department visit averted? No   Does patient have a medical provider? Yes   Patient referred to Emergency Department   Was a mental health screening completed? (GAINS tool) No   Does patient have dental issues? No   Was a dental referral made? No   Does patient have vision issues? No   Does your patient have an abnormal blood pressure today? No   Since previous encounter, have you referred patient for abnormal blood pressure that resulted in a new diagnosis or medication change? No   Does your patient have an abnormal blood glucose today? No   Since previous encounter, have you referred patient for abnormal blood glucose that resulted in a new diagnosis or medication change? No   Was there a life-saving intervention made? Yes         Clinical Intake - 03/15/17 1605      Pre-visit preparation   Pre-visit preparation  completed Yes     Abuse/Neglect   Do you feel unsafe in your current relationship? No   Do you feel physically threatened by others? No   Anyone hurting you at home, work, or school? No     Patient Literacy   How often do you need to have someone help you when you read instructions, pamphlets, or other written materials from your doctor or pharmacy? 3 - Sometimes   What is the last grade level you completed in school? high school     Language Assistant   Interpreter Needed? Yes   Designer, jewellery   Interpreter Name hassan   Interpreter ID [phone]    [employer] staff  notified of student falling on stairs leading to church basement during class break. Volunteer arrived initially and called 911. Unable to determine if student fell down steps. No witness to incident. An assessment of situation found student at bottom of steps leading to church basement near side entrance to church receiving assistance from Psychiatrist. His color was normal, pulse was detected and he responded to his name with hand movements. No visible cuts or bleeding on head. Maintained in supine position until arrival of EMT'S. No vitals were taken due to the distance from the nurse office and immediate arrival of [place] [employer] and EMT'S within minutes of the staff's arrival to assist student. Spouse notified within same location and provided  information on hospital location. Arabic interpreter used. Transportation arranged with taxi for spouse and children to hospital. Follow-up with family planned with [facility]  nurse office and Child psychotherapist.  Ferol Luz, RN/CN

## 2017-03-29 ENCOUNTER — Ambulatory Visit (INDEPENDENT_AMBULATORY_CARE_PROVIDER_SITE_OTHER): Payer: Medicaid Other | Admitting: Psychology

## 2017-03-29 DIAGNOSIS — F4322 Adjustment disorder with anxiety: Secondary | ICD-10-CM

## 2017-03-29 NOTE — Assessment & Plan Note (Signed)
Assessment/Plan Recommendations:  Patient was distressed during the session around discussing his wife's anxiety and PTSD and associated impairment and became tearful on one occasion. He was visibly frustrated with their difficulty in arranging care for himself and his wife.   [facility] discussed reconnecting patient to [facility] clinic for therapy services and to [facility] for psychiatric services. She recommended they begin treatment at [facility] because there is a long wait list for [place] health and they would benefit from therapy at this time.   Aaron Reyes will return to see [facility] jointly with his wife next Friday to make appointments to [facility] clinic and [facility]. At that time they will also schedule to see their PCP.

## 2017-03-29 NOTE — Progress Notes (Signed)
Session was conducted through [employer], ID [id number], Soha ID [id number], and [name], [id number]. Session was conducted jointly with wife.   Reason for follow-up:  Mar sought [facility] follow-up to coordinate care for PTSD.    Issues discussed:  [facility] discussed Bertrum's psychological treatment until now. He reported seeing a "doctor" but was unsure of the location. Based on the address he pulled up it appears he was seen at the [facility] Psychology clinic. He saw a provider there and spoke through an Arabic interpreter but sopped going  after 3 months because the interpreter moved away. According to [place], one of the biggest barriers to care is their inability to schedule appointments with providers and receive reminders. Jaclyn wanted to return to the [facility] psychology clinic but also expressed interest in seeing a doctor. When [facility] explained that [facility] Psychology clinic is a training clinic with therapists in psychology training not medical doctors he reported he wanted to see a medical doctor and inquired whether it was possible to see someone at [facility] Medicine. Theres seems to be some confusion about their understanding of what services a psychiatrist can provide versus therapists. [facility] explained that medical doctors (psychiatrists) can prescribe medication but may not always do therapy and that therapists do not have prescription privileges. Patient also reported his PCP had referred him to a different doctor who prescribed Trazadone and Escitolapram but was unable to provide information on where the doctor was located.   Patient also asked to schedule appointment with their PCP but [facility] was unable to coordinate this because front office staff had left.  Finally, Marquis Lunch signed a release form allowing [facility] to contact [facility] Psychology clinic to discuss his treatment there.

## 2017-04-04 ENCOUNTER — Ambulatory Visit (INDEPENDENT_AMBULATORY_CARE_PROVIDER_SITE_OTHER): Payer: Medicaid Other | Admitting: Internal Medicine

## 2017-04-04 ENCOUNTER — Encounter: Payer: Self-pay | Admitting: Internal Medicine

## 2017-04-04 DIAGNOSIS — R0789 Other chest pain: Secondary | ICD-10-CM

## 2017-04-04 NOTE — Patient Instructions (Addendum)
It was nice seeing you again today Mr. Aaron Reyes!  If you have any questions or concerns, please feel free to call the clinic.   Be well,  Dr. Natale MilchLancaster

## 2017-04-04 NOTE — Progress Notes (Signed)
Subjective:   Patient: Aaron Reyes       Birthdate: 1975-06-19       MRN: [id number]      HPI  Aaron Reyes is a 41 y.o. male presenting for same day appointment for chest pain.   Patient presenting with a number of complaints, however says that chest pain is most pressing concerned. Patient convinced that he has cancer and is dying, which is the cause of his chest pain. Of note, patient has been thoroughly worked up for chest pain in the past, including multiple times in our office and in the ED within the past month, and no cardiac sources for pain have been found.  Today patient reports that current episode of pain began about a month ago. Pain occurs daily, typically lasting for 1-2 hours. Sometimes he is unable to sleep at night due to pain. Endorses SOB and diaphoresis accompanying chest pain. Says pain is located all over his chest. He also reports trouble standing up during episodes of pain. Has been prescribed nitroglycerin in the past which he says does not help. Was prescribed ASA but does not take anymore. Says that he is anxious during episodes of chest pain because he is always anxious about every aspect of his life. Is followed by [facility] for anxiety and PTSD and prescribed Buspar, Lexapro, and Valium which he says he is taking. Has an appt with [facility] tomorrow.   Smoking status reviewed. Patient is current every day smoker.   Review of Systems See HPI.     Objective:  Physical Exam  Constitutional: He is oriented to person, place, and time.  NAD  HENT:  Head: Normocephalic and atraumatic.  Pulmonary/Chest: Effort normal. No respiratory distress.  Neurological: He is alert and oriented to person, place, and time.  Psychiatric:  Agitated.    Unable to examine further as patient left prior to end of appointment.       Assessment & Plan:  Chest pain Not consistent with cardiac etiology. Patient has been significantly worked up for chest pain in the past, including three  ED visits and one [facility] office visit in the past month. EKGs on 09/05, 10/02, and 10/03 all WNL. Also had echo performed and heart cath in 2016 with no signs of cardiac disease (Stress myoview neg for ischemia with EF 45-55% in 01/2015). Patient's description of generalized chest pain occurring for up to 2 hours daily and not resolved with nitro also not entirely consistent with cardiac etiology. Unable to examine patient as he left before I could do so. Pain most likely associated with patient's poorly controlled anxiety and psychiatric issues. Patient admits he is anxious about every aspect of his life, but is also convinced that he has cardiac issues. Tried to reassure patient and explain in depth why I do not feel that his pain is cardiac in etiology and all of the imaging and EKGs supporting this, however patient still convinced pain is cardiac.   Of note, patient became very agitated after I discussed with him that I do not feel that his pain is cardiac in etiology. Patient claiming that no one at [facility] will ever help him, prescribe medications, or schedule appointments to him. Pointed out to patient that he is already prescribed multiple medications, both for anxiety and chest pain among other things, and has been seen in our office within the past month but did not follow up with his PCP as encouraged to. Also pointed out that he has  [facility]. Patient also requesting that I write a letter to the government stating that he should be receiving more money. Explained to patient that this is a same day appointment and that, as I am not his PCP, it would not be appropriate for me to write such a letter. Patient becoming increasingly more agitated. Patient finally stating that he wishes to transfer his care to Health Department. He then left before completion of the visit.   Case discussed with Dr. Deirdre Priesthambliss.   Tarri AbernethyAbigail J Marvelene Stoneberg, MD, MPH PGY-3 [facility] Pager [phone]

## 2017-04-05 ENCOUNTER — Encounter: Payer: Self-pay | Admitting: Internal Medicine

## 2017-04-05 NOTE — Assessment & Plan Note (Signed)
Not consistent with cardiac etiology. Patient has been significantly worked up for chest pain in the past, including three ED visits and [facility] in the past month. EKGs on 09/05, 10/02, and 10/03 all WNL. Also had echo performed and heart cath in 2016 with no signs of cardiac disease (Stress myoview neg for ischemia with EF 45-55% in 01/2015). Patient's description of generalized chest pain occurring for up to 2 hours daily and not resolved with nitro also not entirely consistent with cardiac etiology. Unable to examine patient as he left before I could do so. Pain most likely associated with patient's poorly controlled anxiety and psychiatric issues. Patient admits he is anxious about every aspect of his life, but is also convinced that he has cardiac issues. Tried to reassure patient and explain in depth why I do not feel that his pain is cardiac in etiology and all of the imaging and EKGs supporting this, however patient still convinced pain is cardiac.

## 2017-04-08 ENCOUNTER — Encounter: Payer: Self-pay | Admitting: Family Medicine

## 2017-04-08 ENCOUNTER — Ambulatory Visit (INDEPENDENT_AMBULATORY_CARE_PROVIDER_SITE_OTHER): Payer: Medicaid Other | Admitting: Family Medicine

## 2017-04-08 VITALS — BP 120/70 | HR 87 | Temp 98.3°F | Ht 65.0 in | Wt 149.0 lb

## 2017-04-08 DIAGNOSIS — R0789 Other chest pain: Secondary | ICD-10-CM | POA: Diagnosis not present

## 2017-04-08 NOTE — Patient Instructions (Signed)
It was a pleasure to see you today! Thank you for choosing [facility] for your primary care. Aaron Reyes was seen for generalized pain. Come back to the clinic if you need anything, and go to the emergency room if you have any life threatening symptoms.  We discussed how your cardiac workup ahs been good lately and we have no indication of need for further workup.   We encourage you to continue with staying active and trying to rotate ibuprofen and tylenol.   If we did any lab work today, and the results require attention, either me or my nurse will get in touch with you. If everything is normal, you will get a letter in mail and a message via . If you don't hear from us in two weeks, please give us a call. Otherwise, we look forward to seeing you again at your next visit. If you have any questions or concerns before then, please call the clinic at [phone].  Please bring all your medications to every doctors visit  Sign up for My Chart to have easy access to your labs results, and communication with your Primary care physician.    Please check-out at the front desk before leaving the clinic.    Best,  Dr. Marthenia RollingScott Triana Coover FAMILY MEDICINE RESIDENT - PGY1 04/08/2017 4:35 PM

## 2017-04-09 NOTE — Assessment & Plan Note (Signed)
Patient has had very thorough cardiac workup with no concerning findings.  I assured the patient that we had no objective evidence that this could be MI related.   Other considerations could be msk, anxiety, gerd.  Will discuss PPI at next visit.

## 2017-04-09 NOTE — Progress Notes (Signed)
    Subjective:  Aaron Reyes is a 41 y.o. male [facility] with a chief complaint of chest pain.  He describes this as episodic.   Unable to recall/deny timing related to food or activity level.   Describes pain level as moderate when it happens, not connected with nausea or SOB.  Substernal with no radiation when it does occur, no noted aggravating/alleviating factors.  He was not having the pain currently in the office.   He describes some medical finding he had from [facility] [employer] in which he says two arteries are "laying against each other" and he was told then that was the cause of this pain.  He has since lost the reports during various  Relocations here.      Objective:  Physical Exam: BP 120/70   Pulse 87   Temp 98.3 F (36.8 C) (Oral)   Ht 5\' 5"  (1.651 m)   Wt 149 lb (67.6 kg)   SpO2 98%   BMI 24.79 kg/m   Gen: NAD, anxious CV: RRR with no murmurs appreciated Pulm: NWOB, CTAB with no crackles, wheezes, or rhonchi GI: Normal bowel sounds present. Soft, Nontender, Nondistended. MSK: no edema, cyanosis, or clubbing noted Skin: warm, dry Neuro: grossly normal, moves all extremities Psych: Normal affect and thought content, although quite fixated on wanting further imaging and referal  No results found for this or any previous visit (from the past 72 hour(s)).   Assessment/Plan:  Chest pain Patient has had very thorough cardiac workup with no concerning findings.  I assured the patient that we had no objective evidence that this could be MI related.   Other considerations could be msk, anxiety, gerd.  Will discuss PPI at next visit.  At close of visit, he did ask for a letter to DSS telling him he didn't have to work, I gave him the clinic number and told him his case worker should call Dr. Gwendolyn GrantWalden at the clinic to discuss.  Marthenia RollingScott Taquisha Phung, DO FAMILY MEDICINE RESIDENT - PGY1 04/09/2017 5:55 AM
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
## 2017-05-17 ENCOUNTER — Encounter: Payer: Self-pay | Admitting: Family Medicine

## 2017-05-17 ENCOUNTER — Ambulatory Visit (INDEPENDENT_AMBULATORY_CARE_PROVIDER_SITE_OTHER): Payer: Medicaid Other | Admitting: Psychology

## 2017-05-17 DIAGNOSIS — F4310 Post-traumatic stress disorder, unspecified: Secondary | ICD-10-CM
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
## 2017-05-17 NOTE — Assessment & Plan Note (Signed)
Session was conducted with live translator Aaron Reyes who accompanied Aaron Reyes to his appointment.   Aaron Reyes came to pick up 2 letters written by Dr. Parke SimmersBland for DSS for him and his wife, which recommend they attend English classes twice instead of 5 x per week. Aaron Reyes appeared to understand what the letters said.   He reported some behavioral issues with his youngest son and will be seeking [facility] for [facility] where he and his wife are currently seen. Per Aaron LunchIbrahim, the behavioral issues are exacerbating his wife's distress.  [facility] explained that if he needed to schedule appointment with PCP or with [facility] he can [facility] to [facility] and the front desk will use an interpretation service to communicate with them and schedule an appointment with them.

## 2017-06-09 ENCOUNTER — Encounter (HOSPITAL_COMMUNITY): Payer: Self-pay | Admitting: *Deleted

## 2017-06-09 ENCOUNTER — Emergency Department (HOSPITAL_COMMUNITY)
Admission: EM | Admit: 2017-06-09 | Discharge: 2017-06-09 | Disposition: A | Discharge status: Home / Self Care | Payer: Medicaid Other | Attending: Emergency Medicine | Admitting: Emergency Medicine

## 2017-06-09 ENCOUNTER — Other Ambulatory Visit: Payer: Self-pay

## 2017-06-09 ENCOUNTER — Emergency Department (HOSPITAL_COMMUNITY): Payer: Medicaid Other

## 2017-06-09 DIAGNOSIS — Z7982 Long term (current) use of aspirin: Secondary | ICD-10-CM | POA: Insufficient documentation

## 2017-06-09 DIAGNOSIS — J111 Influenza due to unidentified influenza virus with other respiratory manifestations: Secondary | ICD-10-CM | POA: Diagnosis not present

## 2017-06-09 DIAGNOSIS — Z79899 Other long term (current) drug therapy: Secondary | ICD-10-CM | POA: Insufficient documentation

## 2017-06-09 DIAGNOSIS — M791 Myalgia, unspecified site: Secondary | ICD-10-CM | POA: Diagnosis present

## 2017-06-09 DIAGNOSIS — N410 Acute prostatitis: Secondary | ICD-10-CM | POA: Diagnosis not present

## 2017-06-09 DIAGNOSIS — F17210 Nicotine dependence, cigarettes, uncomplicated: Secondary | ICD-10-CM | POA: Insufficient documentation

## 2017-06-09 DIAGNOSIS — R079 Chest pain, unspecified: Secondary | ICD-10-CM | POA: Diagnosis not present

## 2017-06-09 DIAGNOSIS — R69 Illness, unspecified: Secondary | ICD-10-CM

## 2017-06-09 LAB — BASIC METABOLIC PANEL
Anion gap: 15 (ref 5–15)
BUN: 17 mg/dL (ref 6–20)
CALCIUM: 8.9 mg/dL (ref 8.9–10.3)
CO2: 14 mmol/L — ABNORMAL LOW (ref 22–32)
CREATININE: 0.89 mg/dL (ref 0.61–1.24)
Chloride: 108 mmol/L (ref 101–111)
GFR calc non Af Amer: >60 mL/min (ref >60)
Glucose, Bld: 205 mg/dL — ABNORMAL HIGH (ref 65–99)
Potassium: 4.4 mmol/L (ref 3.5–5.1)
SODIUM: 137 mmol/L (ref 135–145)

## 2017-06-09 LAB — URINALYSIS, ROUTINE W REFLEX MICROSCOPIC
Bilirubin Urine: NEGATIVE
GLUCOSE, UA: 50 mg/dL — AB
Ketones, ur: NEGATIVE mg/dL
Leukocytes, UA: NEGATIVE
NITRITE: NEGATIVE
Protein, ur: NEGATIVE mg/dL
SPECIFIC GRAVITY, URINE: 1.025 (ref 1.005–1.030)
pH: 5 (ref 5.0–8.0)

## 2017-06-09 LAB — CBC
HCT: 47.9 % (ref 39.0–52.0)
Hemoglobin: 17 g/dL (ref 13.0–17.0)
MCH: 30.2 pg (ref 26.0–34.0)
MCHC: 35.5 g/dL (ref 30.0–36.0)
MCV: 85.2 fL (ref 78.0–100.0)
PLATELETS: 263 10*3/uL (ref 150–400)
RBC: 5.62 MIL/uL (ref 4.22–5.81)
RDW: 12.4 % (ref 11.5–15.5)
WBC: 12.3 10*3/uL — AB (ref 4.0–10.5)

## 2017-06-09 LAB — TROPONIN I: TROPONIN I: 0.03 ng/mL — AB (ref <0.03)

## 2017-06-09 IMAGING — DX DG CHEST 2 VIEW
2 series · 2 of 2 positions shown · non-contrast
Comparison: [DATE]

CLINICAL DATA: Pain fall over body for 2 hours including chest,
abdomen and back.

EXAM:
CHEST  2 VIEW

[x chest ap]
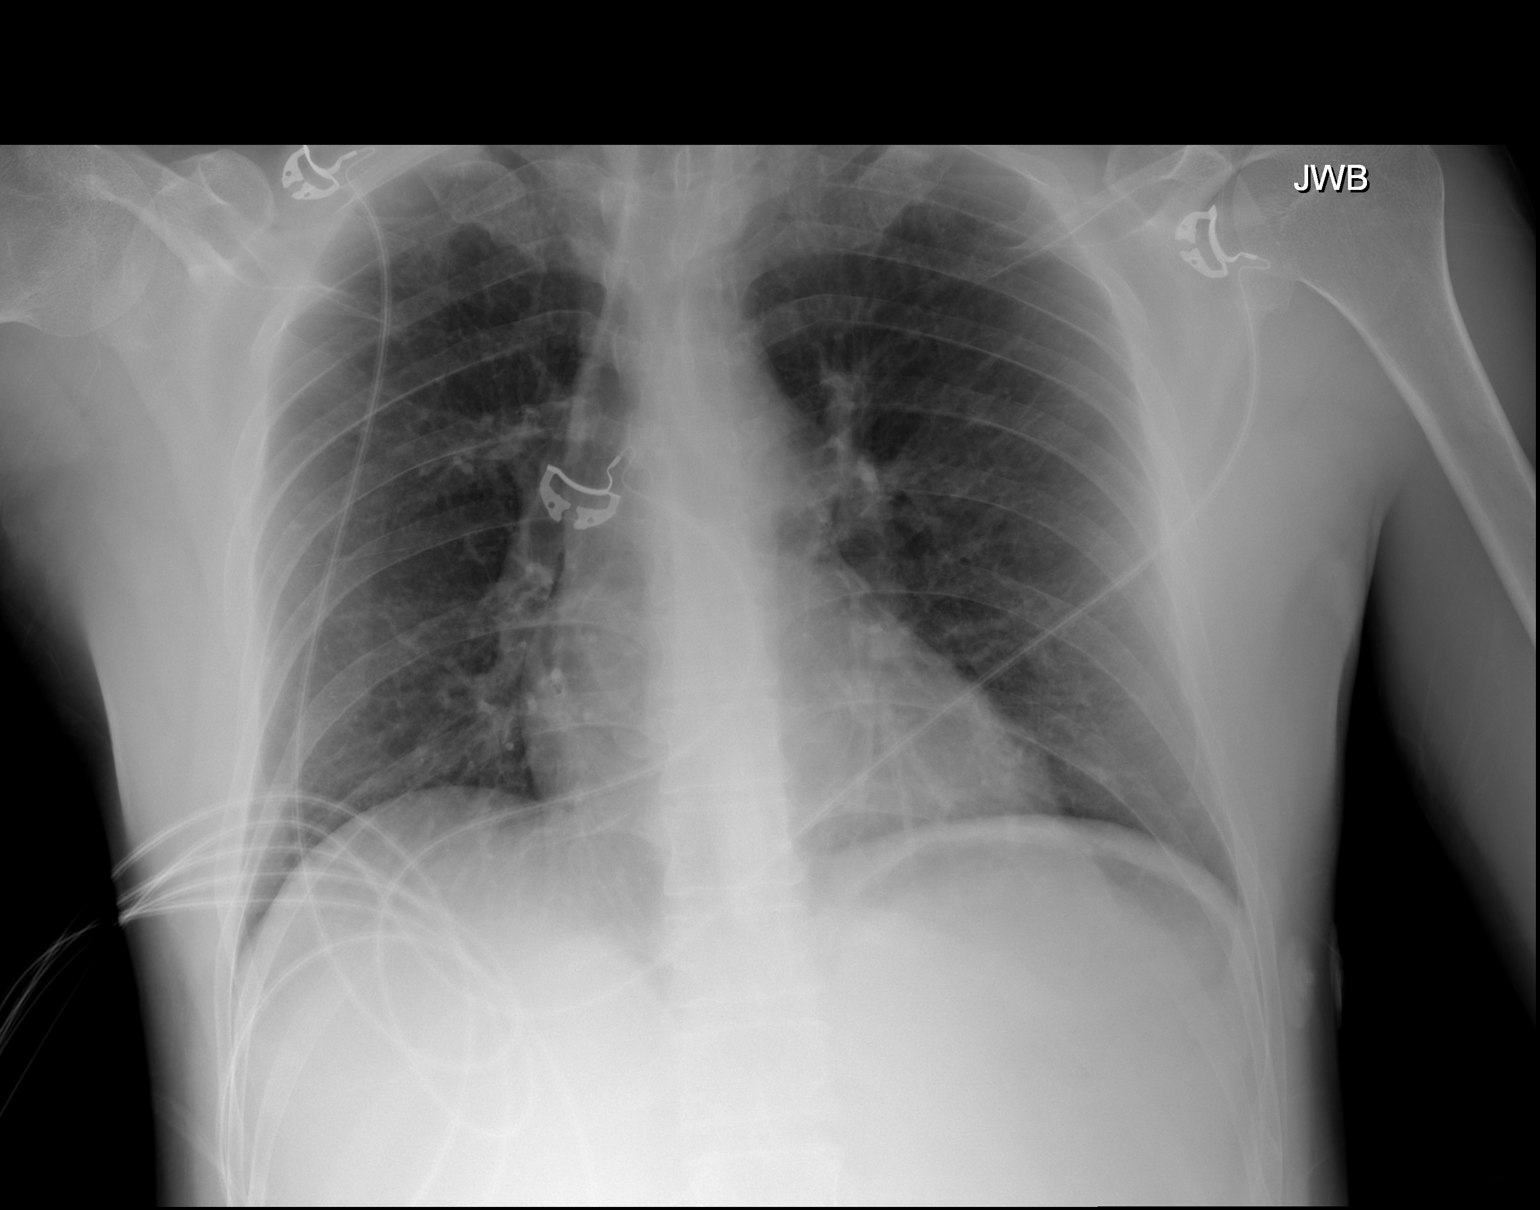

[w chest lat]
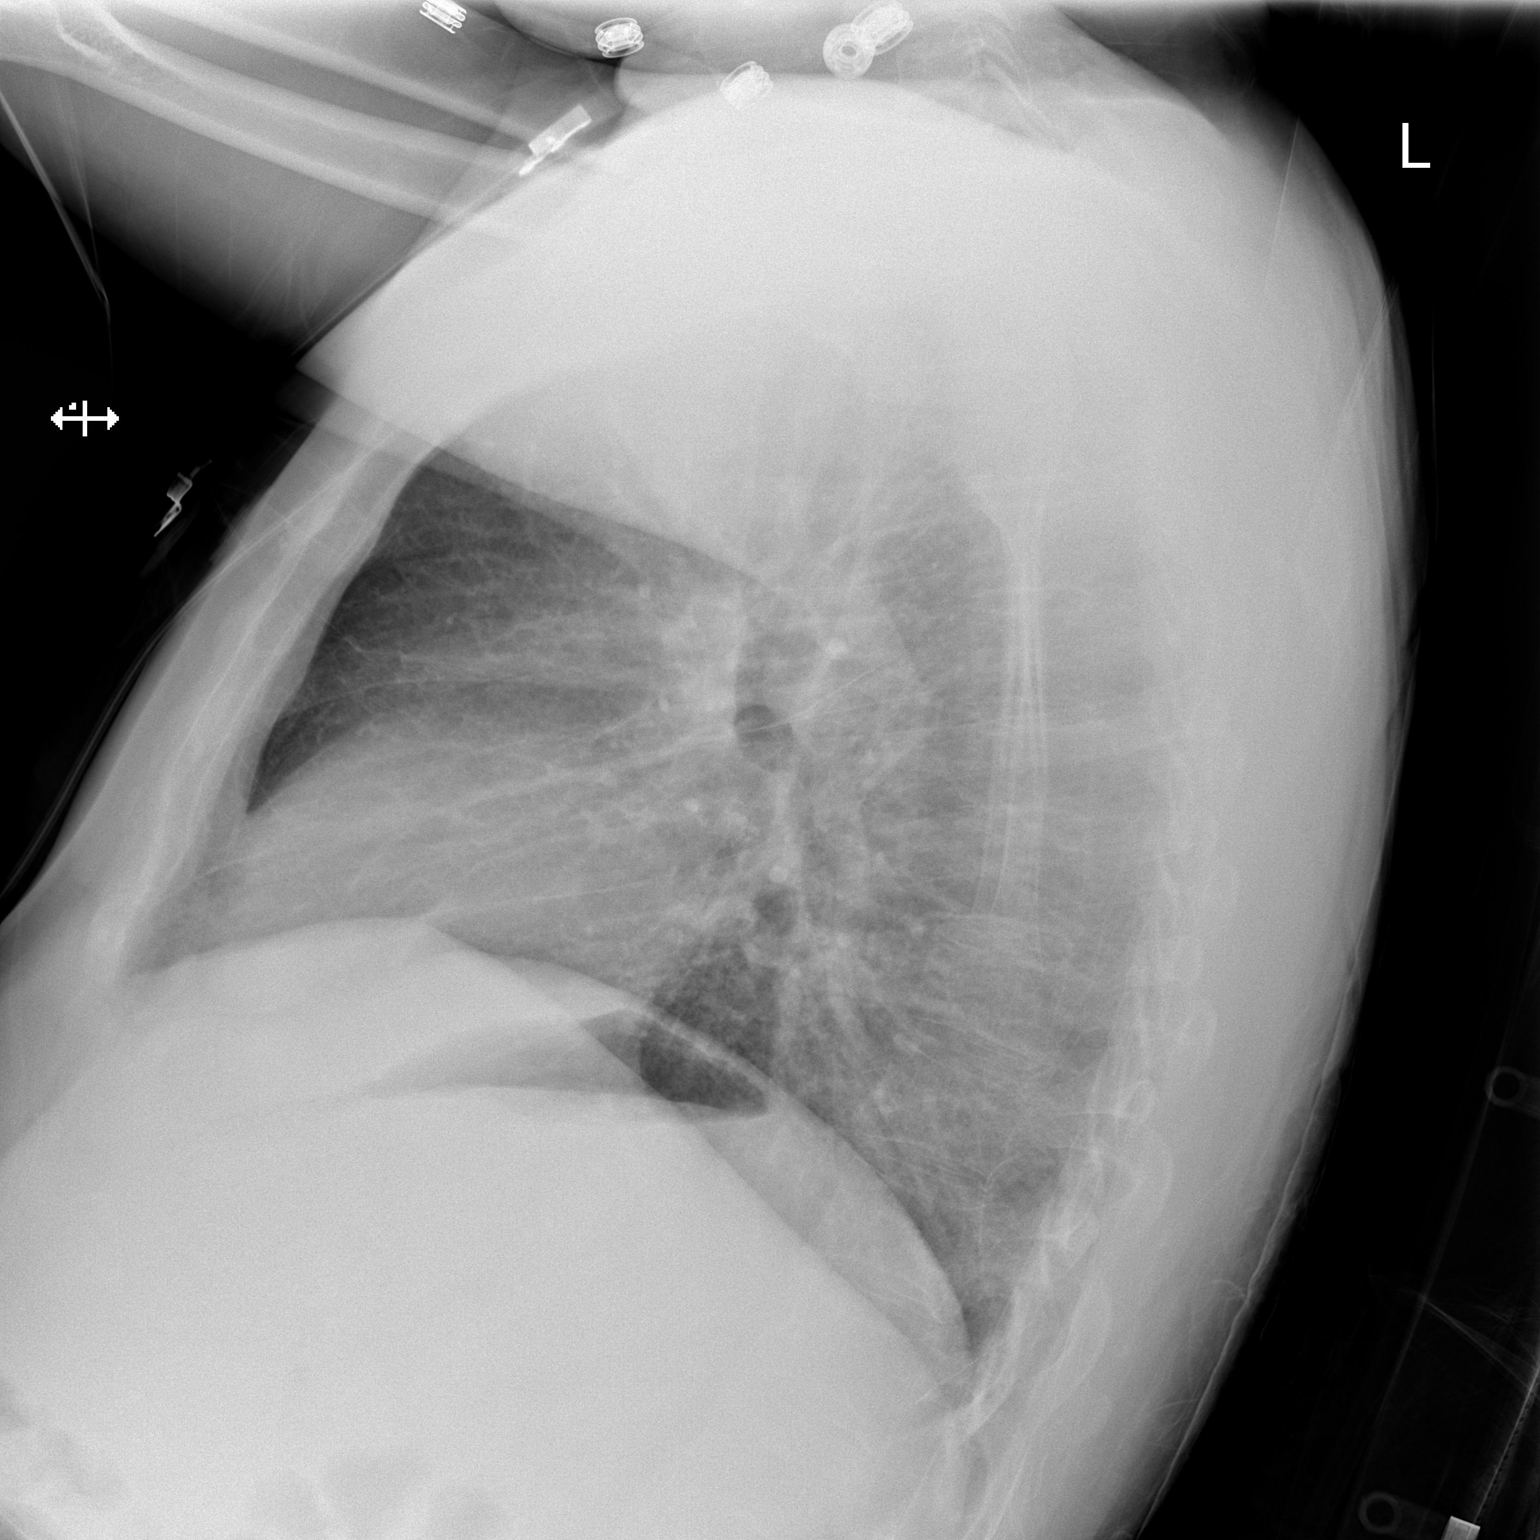

[2 of 2 positions shown; findings below may reference images not displayed]

FINDINGS: Lungs are adequately inflated without focal airspace consolidation
or effusion. Cardiomediastinal silhouette, bones and soft tissues
are within normal.
IMPRESSION: No active cardiopulmonary disease.

## 2017-06-09 MED ORDER — SODIUM CHLORIDE 0.9 % IV BOLUS (SEPSIS)
1000.0000 mL | Freq: Once | INTRAVENOUS | Status: AC
  Administered 2017-06-09: 1000 mL via INTRAVENOUS

## 2017-06-09 MED ORDER — ACETAMINOPHEN 500 MG PO TABS
1000.0000 mg | ORAL_TABLET | Freq: Once | ORAL | Status: AC
  Administered 2017-06-09: 1000 mg via ORAL
  Filled 2017-06-09: qty 2

## 2017-06-09 MED ORDER — DOXYCYCLINE HYCLATE 100 MG PO CAPS: 100.0000 mg | ORAL_CAPSULE | Freq: Two times a day (BID) | ORAL | 0 refills | Status: DC

## 2017-06-09 MED ORDER — DEXTROSE 5 % IV SOLN
1.0000 g | Freq: Once | INTRAVENOUS | Status: AC
  Administered 2017-06-09: 1 g via INTRAVENOUS
  Filled 2017-06-09: qty 10

## 2017-06-09 MED ORDER — KETOROLAC TROMETHAMINE 15 MG/ML IJ SOLN
15.0000 mg | Freq: Once | INTRAMUSCULAR | Status: AC
  Administered 2017-06-09: 15 mg via INTRAVENOUS
  Filled 2017-06-09: qty 1

## 2017-06-09 MED ORDER — AZITHROMYCIN 250 MG PO TABS
1000.0000 mg | ORAL_TABLET | Freq: Once | ORAL | Status: AC
  Administered 2017-06-09: 1000 mg via ORAL
  Filled 2017-06-09: qty 4

## 2017-06-09 NOTE — ED Triage Notes (Signed)
The pt is c/o pain all over his body  For 2 hours  Chest and abd and back pain

## 2017-06-09 NOTE — Discharge Instructions (Signed)
Take 4 over the counter ibuprofen tablets 3 times a day or 2 over-the-counter naproxen tablets twice a day for pain. Also take tylenol 1000mg(2 extra strength) four times a day.    

## 2017-06-09 NOTE — ED Provider Notes (Signed)
[facility] EMERGENCY DEPARTMENT Provider Note   CSN: [id number] Arrival date & time: 06/09/17  1755     History   Chief Complaint Chief Complaint  Patient presents with  . Generalized Body Aches    HPI Aaron Reyes is a 41 y.o. male.  41 yo M with a chief complaint of acute onset muscle aches.  These are all over.  Worse to the lower back and the upper legs.  He feels that the pain goes all the way down from his low back down his legs.  Feels weak all over as well.  Having some chest pain with it.  Patient has had chest pain off and on for many years.  He recently had a clean cath.  He thinks that the pain feels the same.  Denies vomiting or diarrhea.  Denies cough or congestion.  Denies sick contacts.  Denies difficulty urinating or pain with urination.  Denies other medical issue.  Denies recent travel.   The history is provided by the patient and a relative. A language interpreter was used.  Illness  This is a new problem. The current episode started 1 to 2 hours ago. The problem occurs constantly. The problem has not changed since onset.Pertinent negatives include no chest pain, no abdominal pain, no headaches and no shortness of breath. Nothing aggravates the symptoms. Nothing relieves the symptoms. He has tried nothing for the symptoms. The treatment provided no relief.    Past Medical History:  Diagnosis Date  . GERD (gastroesophageal reflux disease)   . Kidney stone   . Non-cardiac chest pain   . S/P cardiac catheterization 03/2015   cath in [place] and [place], no records,  cath at [facility] normal coronary arteries and normal LV function.    Patient Active Problem List   Diagnosis Date Noted  . Arthralgia of both lower legs 02/21/2017  . Fatigue due to excessive exertion 02/21/2017  . Bilateral sensorineural hearing loss 02/07/2017  . Cervicalgia 10/19/2016  . GERD (gastroesophageal reflux disease) 09/04/2016  . Generalized anxiety disorder  01/26/2016  . Post traumatic stress disorder 01/26/2016  . 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 Back pain 04/26/2015  . Adjustment disorder with anxious mood 04/26/2015  . 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 Dysuria 04/26/2015  . Refugee health examination 03/18/2015  . Housing problems 03/18/2015  . Testicular mass 03/18/2015  . Inguinal hernia 03/18/2015  . Chest pain 02/15/2015  . Hypokalemia 02/15/2015    Past Surgical History:  Procedure Laterality Date  . CARDIAC CATHETERIZATION    . CARDIAC CATHETERIZATION     x 2  . CARDIAC CATHETERIZATION N/A 04/11/2015   Procedure: Left Heart Cath and Coronary Angiography;  Surgeon: Lyn Records, MD;  Location: [facility] INVASIVE CV LAB;  Service: Cardiovascular;  Laterality: N/A;  . HERNIA REPAIR         Home Medications    Prior to Admission medications   Medication Sig Start Date End Date Taking? Authorizing Provider  aspirin EC 81 MG tablet Take 1 tablet (81 mg total) by mouth daily. 04/28/15  Yes Leone Brand, NP  aspirin-acetaminophen-caffeine (EXCEDRIN MIGRAINE) [phone] MG tablet Take 1 tablet by mouth every 6 (six) hours as needed for headache.   Yes [provider]  PRESCRIPTION MEDICATION Take 1 tablet by mouth 2 (two) times daily. Medication for anxiety/depression   Yes [provider]  PRESCRIPTION MEDICATION Take 1 tablet by mouth at bedtime. Medication for sleep   Yes  [provider]  busPIRone (BUSPAR) 15 MG tablet Take 1 tablet (15 mg total) by mouth 3 (three) times daily. 12/10/16   Bing Neighbors, FNP  cyclobenzaprine (FLEXERIL) 10 MG tablet Take 1 tablet (10 mg total) by mouth 3 (three) times daily as needed for muscle spasms. 03/15/17   Leland Her, DO  diazepam (VALIUM) 2 MG tablet Take 1 tablet (2 mg total) by mouth every 4 (four) hours as needed for muscle spasms. Patient not taking: Reported on 06/09/2017 03/23/16   Lorre Nick, MD  doxycycline (VIBRAMYCIN) 100 MG  capsule Take 1 capsule (100 mg total) by mouth 2 (two) times daily. 06/09/17   Melene Plan, DO  escitalopram (LEXAPRO) 20 MG tablet Take 1 tablet (20 mg total) by mouth at bedtime. 12/10/16   Bing Neighbors, FNP  nitroGLYCERIN (NITROSTAT) 0.4 MG SL tablet Place 1 tablet (0.4 mg total) under the tongue every 5 (five) minutes as needed for chest pain. 04/06/15   Leone Brand, NP  pantoprazole (PROTONIX) 40 MG tablet Take 1 tablet (40 mg total) by mouth daily. 02/20/17   Almon Hercules, MD  traZODone (DESYREL) 100 MG tablet Take 2 tablets (200 mg total) by mouth at bedtime. 12/10/16   Bing Neighbors, FNP    Family History Family History  Problem Relation Age of Onset  . CAD Father   . Heart attack Father   . Hypertension Father   . Stroke Father   . Cancer Mother   . Diabetes Mother   . Hypertension Mother     Social History Social History   Tobacco Use  . Smoking status: Current Every Day Smoker    Packs/day: 0.25    Types: Cigarettes  . Smokeless tobacco: Never Used  Substance Use Topics  . Alcohol use: No  . Drug use: No     Allergies   Patient has no known allergies.   Review of Systems Review of Systems  Constitutional: Positive for chills and fever.  HENT: Positive for sore throat. Negative for congestion and facial swelling.   Eyes: Negative for discharge and visual disturbance.  Respiratory: Negative for shortness of breath.   Cardiovascular: Negative for chest pain and palpitations.  Gastrointestinal: Negative for abdominal pain, diarrhea and vomiting.  Musculoskeletal: Positive for arthralgias and myalgias.  Skin: Negative for color change and rash.  Neurological: Negative for tremors, syncope and headaches.  Psychiatric/Behavioral: Negative for confusion and dysphoric mood.     Physical Exam Updated Vital Signs BP 114/75   Pulse 92   Temp 99.1 F (37.3 C) (Rectal)   Resp (!) 22   SpO2 96%   Physical Exam  Constitutional: He is oriented to  person, place, and time. He appears well-developed and well-nourished.  HENT:  Head: Normocephalic and atraumatic.  Eyes: EOM are normal. Pupils are equal, round, and reactive to light.  Neck: Normal range of motion. Neck supple. No JVD present.  Cardiovascular: Normal rate and regular rhythm. Exam reveals no gallop and no friction rub.  No murmur heard. Pulmonary/Chest: No respiratory distress. He has no wheezes.  Abdominal: He exhibits no distension and no mass. There is no tenderness. There is no rebound and no guarding. Hernia confirmed negative in the right inguinal area and confirmed negative in the left inguinal area.  Genitourinary: Right testis shows mass (mobile soft). Right testis shows no tenderness. Left testis shows no tenderness.  Musculoskeletal: Normal range of motion. He exhibits tenderness.  Lymphadenopathy: No inguinal adenopathy noted on  the right or left side.  Neurological: He is alert and oriented to person, place, and time.  Skin: No rash noted. No pallor.  Psychiatric: He has a normal mood and affect. His behavior is normal.  Nursing note and vitals reviewed.    ED Treatments / Results  Labs (all labs ordered are listed, but only abnormal results are displayed) Labs Reviewed  BASIC METABOLIC PANEL - Abnormal; Notable for the following components:      Result Value   CO2 14 (*)    Glucose, Bld 205 (*)    All other components within normal limits  CBC - Abnormal; Notable for the following components:   WBC 12.3 (*)    All other components within normal limits  TROPONIN I - Abnormal; Notable for the following components:   Troponin I 0.03 (*)    All other components within normal limits  URINALYSIS, ROUTINE W REFLEX MICROSCOPIC - Abnormal; Notable for the following components:   APPearance HAZY (*)    Glucose, UA 50 (*)    Hgb urine dipstick SMALL (*)    Bacteria, UA RARE (*)    Squamous Epithelial / LPF 0-5 (*)    All other components within normal  limits  GC/CHLAMYDIA PROBE AMP ([place]) NOT AT [facility]    EKG  EKG Interpretation  Date/Time:  Sunday June 09 2017 18:01:50 EST Ventricular Rate:  114 PR Interval:  116 QRS Duration: 92 QT Interval:  316 QTC Calculation: 435 R Axis:   57 Text Interpretation:  Sinus tachycardia Right atrial enlargement Nonspecific T wave abnormality Abnormal ECG No significant change since last tracing Confirmed by ,  ([id number]) on 06/09/2017 6:36:39 PM       Radiology Dg Chest 2 View  Result Date: 06/09/2017 CLINICAL DATA:  Pain fall over body for 2 hours including chest, abdomen and back. EXAM: CHEST  2 VIEW COMPARISON:  03/19/2017 FINDINGS: Lungs are adequately inflated without focal airspace consolidation or effusion. Cardiomediastinal silhouette, bones and soft tissues are within normal. IMPRESSION: No active cardiopulmonary disease. Electronically Signed   By: iel  Boyle M.D.   On: 06/09/2017 18:47    Procedures Procedures (including critical care time)  Medications Ordered in ED Medications  ketorolac (TORADOL) 15 MG/ML injection 15 mg (15 mg Intravenous Given 06/09/17 1932)  sodium chloride 0.9 % bolus 1,000 mL (0 mLs Intravenous Stopped 06/09/17 2040)  acetaminophen (TYLENOL) tablet 1,000 mg (1,000 mg Oral Given 06/09/17 1935)  cefTRIAXone (ROCEPHIN) 1 g in dextrose 5 % 50 mL IVPB (0 g Intravenous Stopped 06/09/17 2018)  azithromycin (ZITHROMAX) tablet 1,000 mg (1,000 mg Oral Given 06/09/17 1935)     Initial Impression / Assessment and Plan / ED Course  I have reviewed the triage vital signs and the nursing notes.  Pertinent labs & imaging results that were available during my care of the patient were reviewed by me and considered in my medical decision making (see chart for details).     41  yo M with a chief complaint of diffuse muscle aches.  This started acutely.  As this is flu season I feel that probably the most likely diagnosis.  He was having more low back  and GU symptoms so I performed a rectal exam.  He did have some significant tenderness to his prostate.  We will treat as possible prostatitis.  Give Toradol fluids Tylenol and reassess.  Patient is feeling better on reassessment.  Lab work with reassuring values.  Will treat the patient is possible influenza,  but with significant prostate tenderness will start on antibiotics.  Given Rocephin and Zithromax and was sent home with a course of doxy.  As the patient has a new mass in his scrotum we will have him follow-up with urology.  Discharge home.  9:34 PM:  I have discussed the diagnosis/risks/treatment options with the patient and family and believe the pt to be eligible for discharge home to follow-up with PCP, urology. We also discussed returning to the ED immediately if new or worsening sx occur. We discussed the sx which are most concerning (e.g., sudden worsening pain, fever, inability to tolerate by mouth) that necessitate immediate return. Medications administered to the patient during their visit and any new prescriptions provided to the patient are listed below.  Medications given during this visit Medications  ketorolac (TORADOL) 15 MG/ML injection 15 mg (15 mg Intravenous Given 06/09/17 1932)  sodium chloride 0.9 % bolus 1,000 mL (0 mLs Intravenous Stopped 06/09/17 2040)  acetaminophen (TYLENOL) tablet 1,000 mg (1,000 mg Oral Given 06/09/17 1935)  cefTRIAXone (ROCEPHIN) 1 g in dextrose 5 % 50 mL IVPB (0 g Intravenous Stopped 06/09/17 2018)  azithromycin (ZITHROMAX) tablet 1,000 mg (1,000 mg Oral Given 06/09/17 1935)     The patient appears reasonably screen and/or stabilized for discharge and I doubt any other medical condition or other [facility] requiring further screening, evaluation, or treatment in the ED at this time prior to discharge.    Final Clinical Impressions(s) / ED Diagnoses   Final diagnoses:  Influenza-like illness  Acute prostatitis    ED Discharge Orders         Ordered    doxycycline (VIBRAMYCIN) 100 MG capsule  2 times daily     06/09/17 2129       Melene PlanFloyd, Bresha Hosack, DO 06/09/17 2134

## 2017-06-09 NOTE — ED Notes (Signed)
Patient transported to X-ray 

## 2017-06-11 LAB — GC/CHLAMYDIA PROBE AMP (~~LOC~~) NOT AT ARMC
Chlamydia: NEGATIVE
Neisseria Gonorrhea: NEGATIVE

## 2017-07-03 ENCOUNTER — Ambulatory Visit (HOSPITAL_COMMUNITY)
Admission: EM | Admit: 2017-07-03 | Discharge: 2017-07-03 | Disposition: A | Discharge status: Home / Self Care | Payer: Medicaid Other | Attending: Urgent Care | Admitting: Urgent Care

## 2017-07-03 ENCOUNTER — Encounter (HOSPITAL_COMMUNITY): Payer: Self-pay | Admitting: Emergency Medicine

## 2017-07-03 DIAGNOSIS — R69 Illness, unspecified: Secondary | ICD-10-CM | POA: Diagnosis not present

## 2017-07-03 DIAGNOSIS — R509 Fever, unspecified: Secondary | ICD-10-CM | POA: Diagnosis present

## 2017-07-03 DIAGNOSIS — R51 Headache: Secondary | ICD-10-CM | POA: Diagnosis present

## 2017-07-03 DIAGNOSIS — R1110 Vomiting, unspecified: Secondary | ICD-10-CM | POA: Diagnosis not present

## 2017-07-03 DIAGNOSIS — J209 Acute bronchitis, unspecified: Secondary | ICD-10-CM | POA: Diagnosis present

## 2017-07-03 DIAGNOSIS — Z79899 Other long term (current) drug therapy: Secondary | ICD-10-CM | POA: Diagnosis not present

## 2017-07-03 DIAGNOSIS — F17210 Nicotine dependence, cigarettes, uncomplicated: Secondary | ICD-10-CM | POA: Insufficient documentation

## 2017-07-03 DIAGNOSIS — Z801 Family history of malignant neoplasm of trachea, bronchus and lung: Secondary | ICD-10-CM | POA: Diagnosis not present

## 2017-07-03 DIAGNOSIS — J111 Influenza due to unidentified influenza virus with other respiratory manifestations: Secondary | ICD-10-CM

## 2017-07-03 DIAGNOSIS — R059 Cough, unspecified: Secondary | ICD-10-CM

## 2017-07-03 DIAGNOSIS — R52 Pain, unspecified: Secondary | ICD-10-CM

## 2017-07-03 DIAGNOSIS — G8929 Other chronic pain: Secondary | ICD-10-CM

## 2017-07-03 DIAGNOSIS — J029 Acute pharyngitis, unspecified: Secondary | ICD-10-CM | POA: Diagnosis not present

## 2017-07-03 DIAGNOSIS — M791 Myalgia, unspecified site: Secondary | ICD-10-CM

## 2017-07-03 DIAGNOSIS — R6889 Other general symptoms and signs: Secondary | ICD-10-CM

## 2017-07-03 DIAGNOSIS — R05 Cough: Secondary | ICD-10-CM | POA: Diagnosis not present

## 2017-07-03 DIAGNOSIS — K219 Gastro-esophageal reflux disease without esophagitis: Secondary | ICD-10-CM | POA: Diagnosis not present

## 2017-07-03 DIAGNOSIS — Z7982 Long term (current) use of aspirin: Secondary | ICD-10-CM | POA: Insufficient documentation

## 2017-07-03 DIAGNOSIS — R079 Chest pain, unspecified: Secondary | ICD-10-CM

## 2017-07-03 DIAGNOSIS — R0789 Other chest pain: Secondary | ICD-10-CM | POA: Diagnosis not present

## 2017-07-03 DIAGNOSIS — F419 Anxiety disorder, unspecified: Secondary | ICD-10-CM | POA: Diagnosis not present

## 2017-07-03 DIAGNOSIS — F329 Major depressive disorder, single episode, unspecified: Secondary | ICD-10-CM | POA: Diagnosis not present

## 2017-07-03 DIAGNOSIS — F17200 Nicotine dependence, unspecified, uncomplicated: Secondary | ICD-10-CM

## 2017-07-03 MED ORDER — KETOROLAC TROMETHAMINE 60 MG/2ML IM SOLN
60.0000 mg | Freq: Once | INTRAMUSCULAR | Status: AC
  Administered 2017-07-03: 60 mg via INTRAMUSCULAR

## 2017-07-03 MED ORDER — BENZONATATE 100 MG PO CAPS: 100.0000 mg | ORAL_CAPSULE | Freq: Three times a day (TID) | ORAL | 0 refills | Status: DC | PRN

## 2017-07-03 MED ORDER — KETOROLAC TROMETHAMINE 60 MG/2ML IM SOLN
INTRAMUSCULAR | Status: AC
  Filled 2017-07-03: qty 2

## 2017-07-03 MED ORDER — HYDROCODONE-HOMATROPINE 5-1.5 MG/5ML PO SYRP: 5.0000 mL | ORAL_SOLUTION | Freq: Every evening | ORAL | 0 refills | Status: DC | PRN

## 2017-07-03 NOTE — ED Provider Notes (Signed)
MRN: [id number] DOB: September 03, 1975  Subjective:   Aaron Reyes is a 42 y.o. male presenting for 1 day history of body aches, sore throat, headache, subjective fever, chills, productive cough with post-tussive emesis. Has a history of chronic non-cardiac chest pain, normal hear catheterization 04/11/2015. Patient is worried that he could have lung cancer because his father had it. He smokes cigarettes daily. He is also worried that he needs another antibiotic for an infection, is requesting injectable pain medications.  No current facility-administered medications for this encounter.   Current Outpatient Medications:  .  aspirin EC 81 MG tablet, Take 1 tablet (81 mg total) by mouth daily., Disp: 30 tablet, Rfl: 3 .  aspirin-acetaminophen-caffeine (EXCEDRIN MIGRAINE) 250-250-65 MG tablet, Take 1 tablet by mouth every 6 (six) hours as needed for headache., Disp: , Rfl:  .  busPIRone (BUSPAR) 15 MG tablet, Take 1 tablet (15 mg total) by mouth 3 (three) times daily., Disp: 90 tablet, Rfl: 5 .  cyclobenzaprine (FLEXERIL) 10 MG tablet, Take 1 tablet (10 mg total) by mouth 3 (three) times daily as needed for muscle spasms., Disp: 20 tablet, Rfl: 0 .  diazepam (VALIUM) 2 MG tablet, Take 1 tablet (2 mg total) by mouth every 4 (four) hours as needed for muscle spasms. (Patient not taking: Reported on 06/09/2017), Disp: 30 tablet, Rfl: 0 .  doxycycline (VIBRAMYCIN) 100 MG capsule, Take 1 capsule (100 mg total) by mouth 2 (two) times daily., Disp: 20 capsule, Rfl: 0 .  escitalopram (LEXAPRO) 20 MG tablet, Take 1 tablet (20 mg total) by mouth at bedtime., Disp: 30 tablet, Rfl: 5 .  nitroGLYCERIN (NITROSTAT) 0.4 MG SL tablet, Place 1 tablet (0.4 mg total) under the tongue every 5 (five) minutes as needed for chest pain., Disp: 20 tablet, Rfl: 3 .  pantoprazole (PROTONIX) 40 MG tablet, Take 1 tablet (40 mg total) by mouth daily., Disp: 90 tablet, Rfl: 0 .  PRESCRIPTION MEDICATION, Take 1 tablet by mouth 2 (two)  times daily. Medication for anxiety/depression, Disp: , Rfl:  .  PRESCRIPTION MEDICATION, Take 1 tablet by mouth at bedtime. Medication for sleep, Disp: , Rfl:  .  traZODone (DESYREL) 100 MG tablet, Take 2 tablets (200 mg total) by mouth at bedtime., Disp: 180 tablet, Rfl: 1  Facility-Administered Medications Ordered in Other Encounters:  .  hepatitis A virus (PF) vaccine (HAVRIX (PF)) injection 1,440 Units, 1 mL, Intramuscular, Once, [place], Abigail Joseph, MD  Marquis Lunchbrahim has No Known Allergies.  Marquis Lunchbrahim  has a past medical history of GERD (gastroesophageal reflux disease), Kidney stone, Non-cardiac chest pain, and S/P cardiac catheterization (03/2015). Also  has a past surgical history that includes Cardiac catheterization; Hernia repair; Cardiac catheterization; and Cardiac catheterization (N/A, 04/11/2015).  Objective:   Vitals: BP 118/86 (BP Location: Left Arm)   Pulse 84   Temp 98 F (36.7 C) (Oral)   Resp (!) 26   SpO2 99%   Physical Exam  Constitutional: He is oriented to person, place, and time. He appears well-developed and well-nourished.  HENT:  Mouth/Throat: Oropharynx is clear and moist.  Eyes: Right eye exhibits no discharge. Left eye exhibits no discharge.  Cardiovascular: Normal rate, regular rhythm and intact distal pulses. Exam reveals no gallop and no friction rub.  No murmur heard. Pulmonary/Chest: No respiratory distress. He has no wheezes. He has no rales.  Neurological: He is alert and oriented to person, place, and time.  Skin: Skin is warm and dry.  Psychiatric: He has a normal mood and affect.  No results found for this or any previous visit (from the past 24 hour(s)).   Assessment and Plan :   Influenza-like illness  Flu-like symptoms  Body aches  Cough  Sore throat  Post-tussive emesis  Smoker  Family history of lung cancer  Atypical chest pain  Chronic chest pain  Will manage patient for influenza like illness. Patient was upset  that he was not getting an antibiotic but I counseled that his physical exam findings, strep test do not support this. I also counseled on potential for harmful effects of taking antibiotics when they are not necessary. I offered supportive care. Return-to-clinic precautions discussed, patient verbalized understanding. Patient is to follow up with his PCP for continued management.   Wallis Bamberg, [place] 07/03/17 [phone]

## 2017-07-03 NOTE — Discharge Instructions (Addendum)
You may take 500mg  Tylenol with ibuprofen 600mg  every 6 hours for throat pain, body aches, fever and inflammation. For sore throat try using a honey-based tea. Use 3 teaspoons of honey with juice squeezed from half lemon. Place shaved pieces of ginger into 1/2-1 cup of water and warm over stove top. Then mix the ingredients and repeat every 4 hours as needed.

## 2017-07-03 NOTE — ED Triage Notes (Addendum)
Via Arabic interpreter [phone]#[id number]  PT C/O: cold sx onset yest associated w/sore throat, odynophagia, chills, fever, vomiting, nauseas, body aches, headache    [facility] for similar sx  TAKING MEDS: Doxycycline for Prostatitis   A&O x4... NAD... Ambulatory

## 2017-07-04 ENCOUNTER — Other Ambulatory Visit: Payer: Self-pay

## 2017-07-04 ENCOUNTER — Encounter: Payer: Self-pay | Admitting: Student

## 2017-07-04 ENCOUNTER — Ambulatory Visit: Payer: Medicaid Other | Admitting: Student

## 2017-07-04 VITALS — BP 112/80 | HR 98 | Temp 98.1°F | Ht 65.0 in | Wt 153.8 lb

## 2017-07-04 DIAGNOSIS — R69 Illness, unspecified: Secondary | ICD-10-CM

## 2017-07-04 DIAGNOSIS — J111 Influenza due to unidentified influenza virus with other respiratory manifestations: Secondary | ICD-10-CM

## 2017-07-04 NOTE — Progress Notes (Signed)
  Subjective:    Aaron Reyes is a 42 y.o. old male here for multiple symptoms Video interpretor with ID # [id number] was used for this encounter.  HPI Patient presents with headache, myalgia, sore throat, cough, runny nose, fever and chills on and off for the last 4-5 days. Reports having similar symptoms three weeks ago. Went to ED three weeks ago and yesterday. He was diagnosed with influenza-like illness and recommended on conservative management. Strep culture were obtained yesterday and pending. He didn't have flu shot this year. He denies nausea or vomiting. Denies diarrhea. He has been tolerating fluid. He has been taking a lot of fluid.  He says all his family members are sick with similar symptoms. He is concerned that he could have a lung cancer as he father died from lung cancer.  Patient has history of anxiety, PTSD and ?depression. He is an immigrant from [place].  PMH/Problem List: has Chest pain; Hypokalemia; Refugee health examination; Housing problems; Testicular mass; Inguinal hernia; Back pain; Adjustment disorder with anxious mood; Tinnitus of both ears; Dysuria; Positive QuantiFERON-TB Gold test; Generalized anxiety disorder; Post traumatic stress disorder; GERD (gastroesophageal reflux disease); Cervicalgia; Bilateral sensorineural hearing loss; Arthralgia of both lower legs; Fatigue due to excessive exertion; and Influenza-like illness on their problem list.   has a past medical history of GERD (gastroesophageal reflux disease), Kidney stone, Non-cardiac chest pain, and S/P cardiac catheterization (03/2015).  FH:  Family History  Problem Relation Age of Onset  . CAD Father   . Heart attack Father   . Hypertension Father   . Stroke Father   . Cancer Mother   . Diabetes Mother   . Hypertension Mother     [facility] Social History   Tobacco Use  . Smoking status: Current Every Day Smoker    Packs/day: 0.25    Types: Cigarettes  . Smokeless tobacco: Never Used  Substance Use Topics   . Alcohol use: No  . Drug use: No    Review of Systems Review of systems negative except for pertinent positives and negatives in history of present illness above.     Objective:     Vitals:   07/04/17 1620  BP: 112/80  Pulse: 98  Temp: 98.1 F (36.7 C)  TempSrc: Oral  SpO2: 97%  Weight: 153 lb 12.8 oz (69.8 kg)  Height: 5\' 5"  (1.651 m)   Body mass index is 25.59 kg/m.  Physical Exam  GEN: appears well, no apparent distress. Head: normocephalic and atraumatic  Eyes: conjunctiva with mild injection, sclera anicteric Nares: rhinorrhea, congestion or erythema bilaterally Oropharynx: mmm with some erythema but no exudation HEM: some anterior cervical triangle lymphadenopathies CVS: RRR, nl s1 & s2, no murmurs, cap refills brisk RESP: no IWOB, good air movement bilaterally, CTAB GI: BS present & normal, soft, NTND MSK: no focal tenderness or notable swelling SKIN: no apparent skin lesion NEURO: alert and oiented appropriately, no gross deficits  PSYCH: anxious    Assessment and Plan:  1. Influenza-like illness: symptoms consistent with flu. He didn't have flu vaccine this year. Family member with similar symptoms. Unlikely strep pharyngitis. He has no resp distress. Lung exam within normal limit. Reassured him that it is very unlikely for him to have lung cancer. He is relieved to hear this. Discussed conservative management. Will follow up on his strep culture from ED.   Return if symptoms worsen or fail to improve.  Almon Herculesaye T Hatsue Sime, MD 07/04/17 Pager: [phone]

## 2017-07-04 NOTE — Patient Instructions (Signed)

## 2017-07-06 LAB — CULTURE, GROUP A STREP (THRC)

## 2017-08-26 ENCOUNTER — Ambulatory Visit (INDEPENDENT_AMBULATORY_CARE_PROVIDER_SITE_OTHER): Payer: Medicaid Other | Admitting: *Deleted

## 2017-08-26 DIAGNOSIS — Z23 Encounter for immunization: Secondary | ICD-10-CM | POA: Diagnosis present

## 2017-09-02 ENCOUNTER — Encounter: Payer: Self-pay | Admitting: Student

## 2017-09-02 ENCOUNTER — Ambulatory Visit: Payer: Medicaid Other | Admitting: Student

## 2017-09-02 ENCOUNTER — Other Ambulatory Visit: Payer: Self-pay

## 2017-09-02 VITALS — BP 118/72 | HR 95 | Temp 98.3°F | Ht 65.0 in | Wt 151.6 lb

## 2017-09-02 DIAGNOSIS — R52 Pain, unspecified: Secondary | ICD-10-CM

## 2017-09-02 LAB — POCT SEDIMENTATION RATE: POCT SED RATE: 0 mm/hr (ref 0–22)

## 2017-09-02 MED ORDER — AMITRIPTYLINE HCL 50 MG PO TABS: 25.0000 mg | ORAL_TABLET | Freq: Every day | ORAL | 2 refills | Status: DC

## 2017-09-02 MED ORDER — AMITRIPTYLINE HCL 25 MG PO TABS: 25.0000 mg | ORAL_TABLET | Freq: Every day | ORAL | 0 refills | Status: DC

## 2017-09-02 NOTE — Patient Instructions (Signed)
Myofascial Pain Syndrome and Fibromyalgia Myofascial pain syndrome and fibromyalgia are both pain disorders. This pain may be felt mainly in your muscles.  Myofascial pain syndrome: ? Always has trigger points or tender points in the muscle that will cause pain when pressed. The pain may come and go. ? Usually affects your neck, upper back, and shoulder areas. The pain often radiates into your arms and hands.  Fibromyalgia: ? Has muscle pains and tenderness that come and go. ? Is often associated with fatigue and sleep disturbances. ? Has trigger points. ? Tends to be long-lasting (chronic), but is not life-threatening.  Fibromyalgia and myofascial pain are not the same. However, they often occur together. If you have both conditions, each can make the other worse. Both are common and can cause enough pain and fatigue to make day-to-day activities difficult. What are the causes? The exact causes of fibromyalgia and myofascial pain are not known. People with certain gene types may be more likely to develop fibromyalgia. Some factors can be triggers for both conditions, such as:  Spine disorders.  Arthritis.  Severe injury (trauma) and other physical stressors.  Being under a lot of stress.  A medical illness.  What are the signs or symptoms? Fibromyalgia The main symptom of fibromyalgia is widespread pain and tenderness in your muscles. This can vary over time. Pain is sometimes described as stabbing, shooting, or burning. You may have tingling or numbness, too. You may also have sleep problems and fatigue. You may wake up feeling tired and groggy (fibro fog). Other symptoms may include:  Bowel and bladder problems.  Headaches.  Visual problems.  Problems with odors and noises.  Depression or mood changes.  Painful menstrual periods (dysmenorrhea).  Dry skin or eyes.  Myofascial pain syndrome Symptoms of myofascial pain syndrome include:  Tight, ropy bands of  muscle.  Uncomfortable sensations in muscular areas, such as: ? Aching. ? Cramping. ? Burning. ? Numbness. ? Tingling. ? Muscle weakness.  Trouble moving certain muscles freely (range of motion).  How is this diagnosed? There are no specific tests to diagnose fibromyalgia or myofascial pain syndrome. Both can be hard to diagnose because their symptoms are common in many other conditions. Your health care provider may suspect one or both of these conditions based on your symptoms and medical history. Your health care provider will also do a physical exam. The key to diagnosing fibromyalgia is having pain, fatigue, and other symptoms for more than three months that cannot be explained by another condition. The key to diagnosing myofascial pain syndrome is finding trigger points in muscles that are tender and cause pain elsewhere in your body (referred pain). How is this treated? Treating fibromyalgia and myofascial pain often requires a team of health care providers. This usually starts with your primary provider and a physical therapist. You may also find it helpful to work with alternative health care providers, such as massage therapists or acupuncturists. Treatment for fibromyalgia may include medicines. This may include nonsteroidal anti-inflammatory drugs (NSAIDs), along with other medicines. Treatment for myofascial pain may also include:  NSAIDs.  Cooling and stretching of muscles.  Trigger point injections.  Sound wave (ultrasound) treatments to stimulate muscles.  Follow these instructions at home:  Take medicines only as directed by your health care provider.  Exercise as directed by your health care provider or physical therapist.  Try to avoid stressful situations.  Practice relaxation techniques to control your stress. You may want to try: ? Biofeedback. ? Visual   imagery. ? Hypnosis. ? Muscle relaxation. ? Yoga. ? Meditation.  Talk to your health care provider  about alternative treatments, such as acupuncture or massage treatment.  Maintain a healthy lifestyle. This includes eating a healthy diet and getting enough sleep.  Consider joining a support group.  Do not do activities that stress or strain your muscles. That includes repetitive motions and heavy lifting. Where to find more information:  National Fibromyalgia Association: www.fmaware.org  Arthritis Foundation: www.arthritis.org  American Chronic Pain Association: www.theacpa.org/condition/myofascial-pain Contact a health care provider if:  You have new symptoms.  Your symptoms get worse.  You have side effects from your medicines.  You have trouble sleeping.  Your condition is causing depression or anxiety. This information is not intended to replace advice given to you by your health care provider. Make sure you discuss any questions you have with your health care provider. Document Released: 06/04/2005 Document Revised: 11/10/2015 Document Reviewed: 03/10/2014 Elsevier Interactive Patient Education  2018 [employer] Inc.  

## 2017-09-02 NOTE — Progress Notes (Signed)
Subjective:    Aaron Reyes is a 42 y.o. old male here for pain. Video interpreter with ID number [id number] was used for this encounter HPI Reports having neck, shoulder, arm and back pain: these have been going on for over a year. Could happen together or separately. Pain on and off. Pain is sharp in nature. Had severe neck pain three days ago that he even couldn't move his neck or make a fist.  He also reports numbness in both legs, sometimes in one leg. No temporal pattern. Denies fever, weight loss, bowel or bladder issue. He does lifting jobs. He says he likes to know what is going on with him.  He had a normal CT cervical spine about 5 months ago.  He had multiple x-rays without significant finding. He is an immigrant from [place].  He has history of GAD and PTSD. PMH/Problem List: has Chest pain; Hypokalemia; Refugee health examination; Housing problems; Testicular mass; Inguinal hernia; Back pain; Adjustment disorder with anxious mood; Tinnitus of both ears; Dysuria; Positive QuantiFERON-TB Gold test; Generalized anxiety disorder; Post traumatic stress disorder; GERD (gastroesophageal reflux disease); Cervicalgia; Bilateral sensorineural hearing loss; Arthralgia of both lower legs; Fatigue due to excessive exertion; and Influenza-like illness on their problem list.   has a past medical history of GERD (gastroesophageal reflux disease), Kidney stone, Non-cardiac chest pain, and S/P cardiac catheterization (03/2015).  FH:  Family History  Problem Relation Age of Onset  . CAD Father   . Heart attack Father   . Hypertension Father   . Stroke Father   . Cancer Mother   . Diabetes Mother   . Hypertension Mother     [facility] Social History   Tobacco Use  . Smoking status: Current Every Day Smoker    Packs/day: 0.25    Types: Cigarettes  . Smokeless tobacco: Never Used  Substance Use Topics  . Alcohol use: No  . Drug use: No    Review of Systems Review of systems negative except for  pertinent positives and negatives in history of present illness above.     Objective:     Vitals:   09/02/17 1433  BP: 118/72  Pulse: 95  Temp: 98.3 F (36.8 C)  TempSrc: Oral  SpO2: 99%  Weight: 151 lb 9.6 oz (68.8 kg)  Height: 5\' 5"  (1.651 m)   Body mass index is 25.23 kg/m.  Physical Exam  GEN: Anxious looking Head: normocephalic and atraumatic  Eyes: conjunctiva without injection, sclera anicteric HEM: negative for cervical or periauricular lymphadenopathies CVS: RRR, nl s1 & s2, no murmurs, no edema RESP: no IWOB, good air movement bilaterally, CTAB GI: BS present & normal, soft, NTND MSK:   Normal skin, spine with normal alignment and no deformity. LE symmetric appears symmetric  Diffuse tenderness of palpation over his upper and lower backs, shoulders, his arms and his legs.  ROM is full in his neck and lumbosacral area  Neuro exam in LE: motor 5/5 in all muscle groups, light sensation intact in L4-S1 dermatomes, biceps and patellar reflex 2+ bilaterally  SKIN: no apparent skin lesion ENDO: negative thyromegally NEURO: alert and oiented appropriately, no gross deficits  PSYCH:  appropriately dressed. Not cooperative or attentive. Speech volume and rate high. Mood is depressed with a mildly restricted affect. Appears anxious. Thought process not logical.  No suicidal or homicidal ideation. Does not appear to be responding to any internal stimuli.  Not able to maintain train of thought and concentrate on the questions. Cognitive ability may be  below average.   Assessment and Plan:  1. Generalized body aches: I think this is a manifestation of his underlying mood issue.  He has history of GAD and PTSD.  He is an immigrant from [name]. He appears anxious and depressed.  Increased speech rate and volume.  Some difficulty concentrating on questions. Will obtain sed rate and CRP to exclude inflammatory process although he has no constitutional symptoms.  - amitriptyline  (ELAVIL) 50 MG tablet; Take 0.5 tablets (25 mg total) by mouth at bedtime.  Dispense: 30 tablet; Refill: 2  Return if symptoms worsen or fail to improve.  Almon Herculesaye T Gonfa, MD 09/02/17 Pager: [phone]

## 2017-09-03 LAB — C-REACTIVE PROTEIN: CRP: 2.1 mg/L (ref 0.0–4.9)

## 2017-11-29 ENCOUNTER — Other Ambulatory Visit: Payer: Self-pay | Admitting: Student

## 2017-11-29 DIAGNOSIS — K219 Gastro-esophageal reflux disease without esophagitis: Secondary | ICD-10-CM

## 2018-01-01 ENCOUNTER — Encounter (HOSPITAL_COMMUNITY): Payer: Self-pay

## 2018-01-01 ENCOUNTER — Inpatient Hospital Stay (HOSPITAL_COMMUNITY)
Admission: EM | Admit: 2018-01-01 | Discharge: 2018-01-03 | DRG: 880 | Disposition: A | Discharge status: Home Health | Payer: Medicaid Other | Attending: Family Medicine | Admitting: Family Medicine

## 2018-01-01 ENCOUNTER — Emergency Department (HOSPITAL_COMMUNITY): Payer: Medicaid Other

## 2018-01-01 ENCOUNTER — Other Ambulatory Visit: Payer: Self-pay

## 2018-01-01 DIAGNOSIS — R0600 Dyspnea, unspecified: Secondary | ICD-10-CM | POA: Diagnosis not present

## 2018-01-01 DIAGNOSIS — Z91018 Allergy to other foods: Secondary | ICD-10-CM

## 2018-01-01 DIAGNOSIS — R0781 Pleurodynia: Secondary | ICD-10-CM

## 2018-01-01 DIAGNOSIS — Z720 Tobacco use: Secondary | ICD-10-CM | POA: Diagnosis not present

## 2018-01-01 DIAGNOSIS — E119 Type 2 diabetes mellitus without complications: Secondary | ICD-10-CM

## 2018-01-01 DIAGNOSIS — N179 Acute kidney failure, unspecified: Secondary | ICD-10-CM | POA: Diagnosis present

## 2018-01-01 DIAGNOSIS — R42 Dizziness and giddiness: Secondary | ICD-10-CM | POA: Diagnosis present

## 2018-01-01 DIAGNOSIS — R079 Chest pain, unspecified: Secondary | ICD-10-CM

## 2018-01-01 DIAGNOSIS — N4830 Priapism, unspecified: Secondary | ICD-10-CM | POA: Diagnosis present

## 2018-01-01 DIAGNOSIS — F419 Anxiety disorder, unspecified: Principal | ICD-10-CM | POA: Diagnosis present

## 2018-01-01 DIAGNOSIS — F17210 Nicotine dependence, cigarettes, uncomplicated: Secondary | ICD-10-CM | POA: Diagnosis present

## 2018-01-01 DIAGNOSIS — I252 Old myocardial infarction: Secondary | ICD-10-CM

## 2018-01-01 DIAGNOSIS — R55 Syncope and collapse: Secondary | ICD-10-CM | POA: Diagnosis present

## 2018-01-01 DIAGNOSIS — E785 Hyperlipidemia, unspecified: Secondary | ICD-10-CM | POA: Diagnosis present

## 2018-01-01 DIAGNOSIS — R0789 Other chest pain: Secondary | ICD-10-CM

## 2018-01-01 HISTORY — DX: Acute myocardial infarction, unspecified: I21.9

## 2018-01-01 LAB — CBC
HEMATOCRIT: 42.8 % (ref 39.0–52.0)
HEMOGLOBIN: 14.6 g/dL (ref 13.0–17.0)
MCH: 29.2 pg (ref 26.0–34.0)
MCHC: 34.1 g/dL (ref 30.0–36.0)
MCV: 85.6 fL (ref 78.0–100.0)
Platelets: 231 10*3/uL (ref 150–400)
RBC: 5 MIL/uL (ref 4.22–5.81)
RDW: 12.3 % (ref 11.5–15.5)
WBC: 9 10*3/uL (ref 4.0–10.5)

## 2018-01-01 LAB — TROPONIN I

## 2018-01-01 LAB — BASIC METABOLIC PANEL
ANION GAP: 8 (ref 5–15)
BUN: 14 mg/dL (ref 6–20)
CHLORIDE: 114 mmol/L — AB (ref 98–111)
CO2: 24 mmol/L (ref 22–32)
Calcium: 9.1 mg/dL (ref 8.9–10.3)
Creatinine, Ser: 1.2 mg/dL (ref 0.61–1.24)
GFR calc Af Amer: >60 mL/min (ref >60)
GLUCOSE: 109 mg/dL — AB (ref 70–99)
POTASSIUM: 4.7 mmol/L (ref 3.5–5.1)
Sodium: 146 mmol/L — ABNORMAL HIGH (ref 135–145)

## 2018-01-01 LAB — I-STAT TROPONIN, ED
Troponin i, poc: 0 ng/mL (ref 0.00–0.08)
Troponin i, poc: 0 ng/mL (ref 0.00–0.08)

## 2018-01-01 LAB — RAPID URINE DRUG SCREEN, HOSP PERFORMED
Amphetamines: NOT DETECTED
BENZODIAZEPINES: NOT DETECTED
COCAINE: NOT DETECTED
OPIATES: NOT DETECTED
Tetrahydrocannabinol: NOT DETECTED

## 2018-01-01 LAB — D-DIMER, QUANTITATIVE (NOT AT ARMC): D DIMER QUANT: 0.36 ug{FEU}/mL (ref 0.00–0.50)

## 2018-01-01 LAB — CBG MONITORING, ED: Glucose-Capillary: 111 mg/dL — ABNORMAL HIGH (ref 70–99)

## 2018-01-01 IMAGING — CT CT ANGIO CHEST-ABD-PELV FOR DISSECTION W/ AND WO/W CM
2 of 7 series · 13 of 46 positions shown, 15 images · IV contrast (OMNI 350)
Comparison: Chest radiograph dated [DATE]

CLINICAL DATA: 41-year-old male with chest and back pain. Concern
for aortic dissection.

EXAM:
CT ANGIOGRAPHY CHEST, ABDOMEN AND PELVIS
TECHNIQUE: Multidetector CT imaging through the chest, abdomen and pelvis was
performed using the standard protocol during bolus administration of
intravenous contrast. Multiplanar reconstructed images and MIPs were
obtained and reviewed to evaluate the vascular anatomy.
CONTRAST:  100mL [OR] IOPAMIDOL ([OR]) INJECTION 76%

[Series 7: dissection 2mm · axial · 0.84mm/px · z∈[+835,+1415]mm · 10 of 326 slices shown, 12 images]
[im 18/326  soft-tissue]
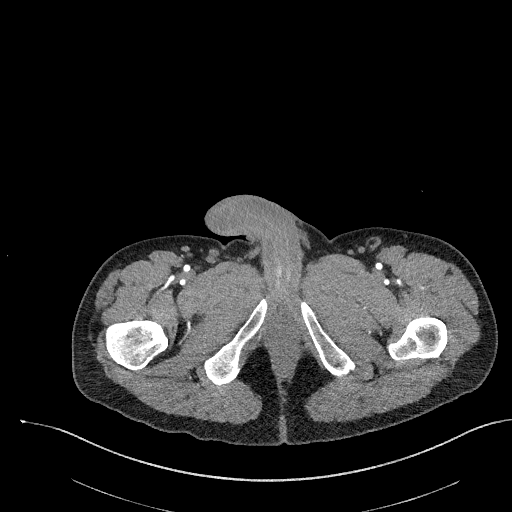
[im 18/326  bone]
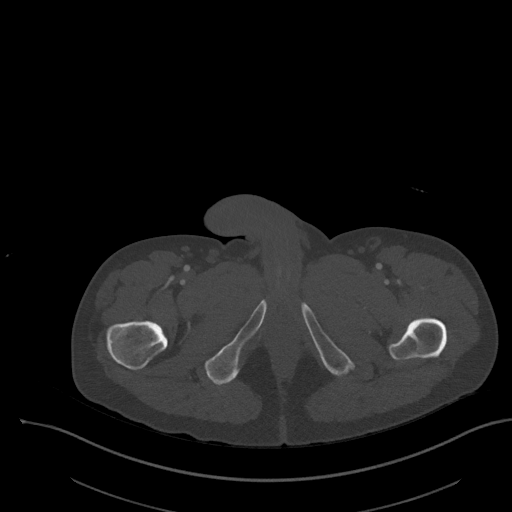
[im 52/326  soft-tissue]
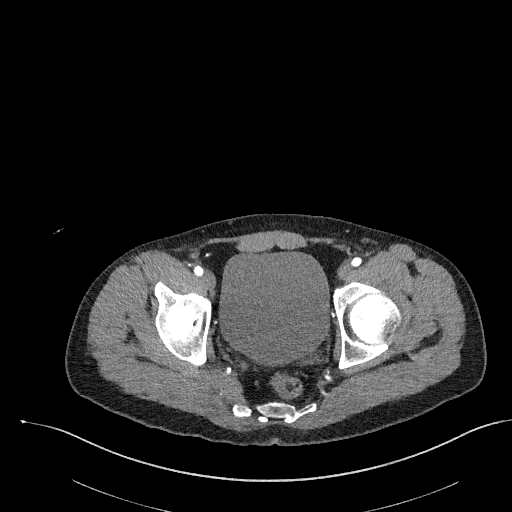
[im 86/326  soft-tissue]
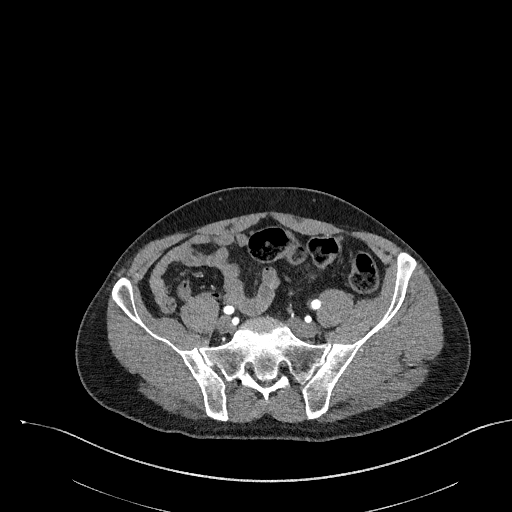
[im 120/326  soft-tissue]
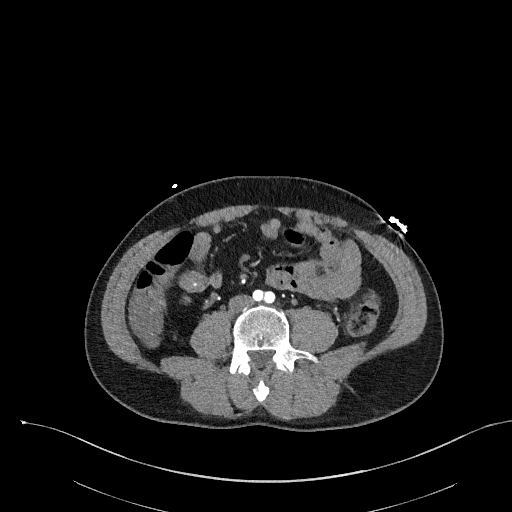
[im 154/326  soft-tissue]
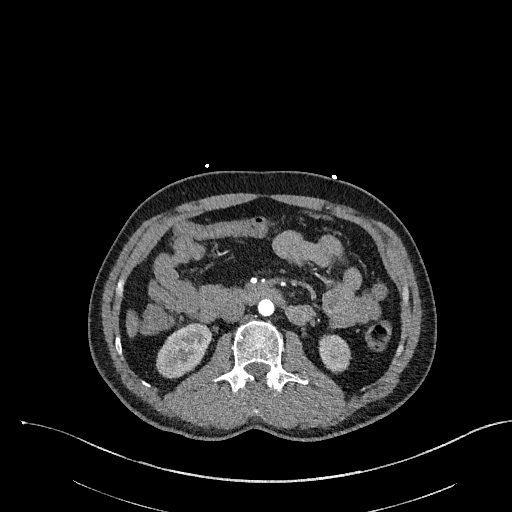
[im 172/326  soft-tissue]
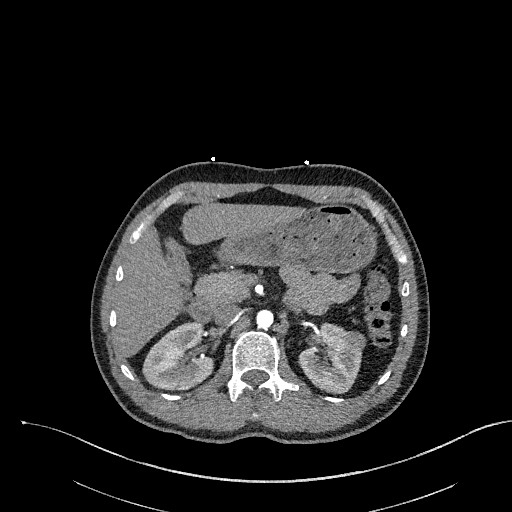
[im 206/326  soft-tissue]
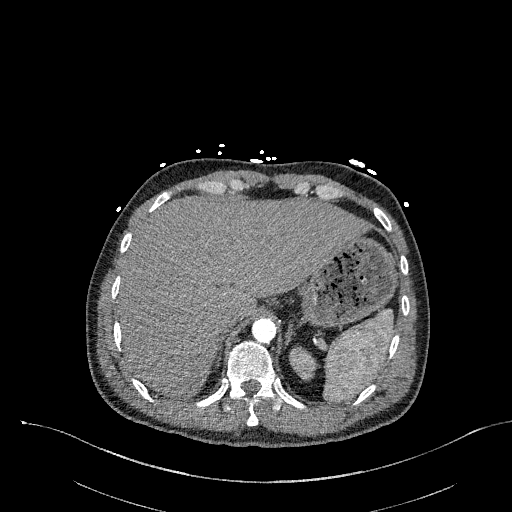
[im 240/326  soft-tissue]
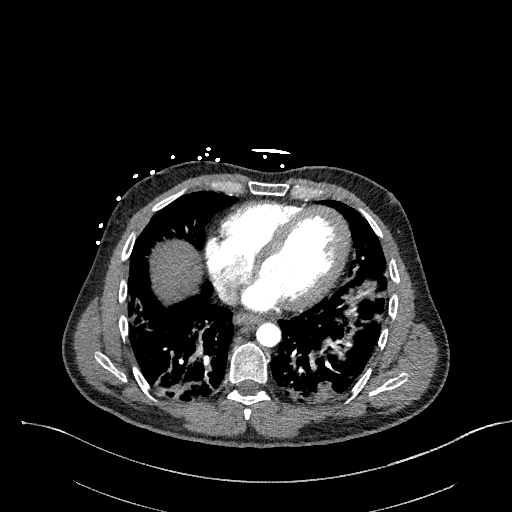
[im 274/326  soft-tissue]
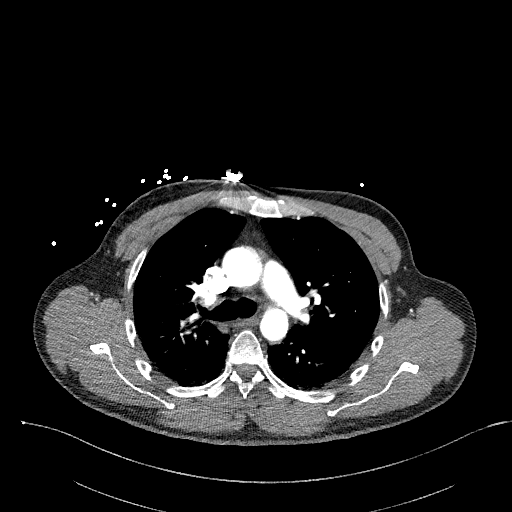
[im 274/326  bone]
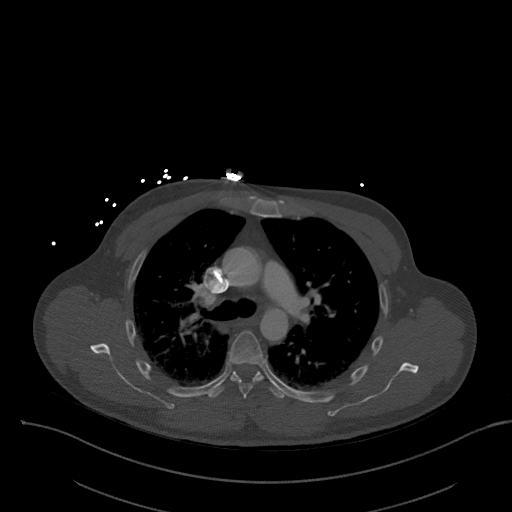
[im 308/326  soft-tissue]
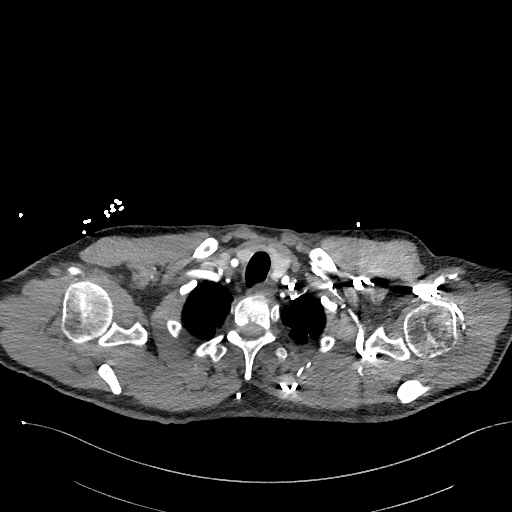

[Series 10: dissection 2mm cor · coronal · 0.77mm/px · 3 of 133 slices shown]
[im 34/133  soft-tissue]
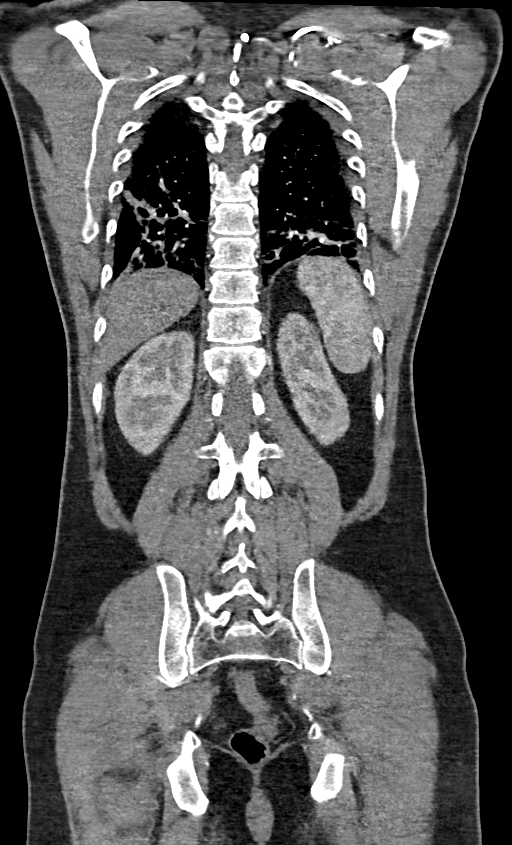
[im 67/133  soft-tissue]
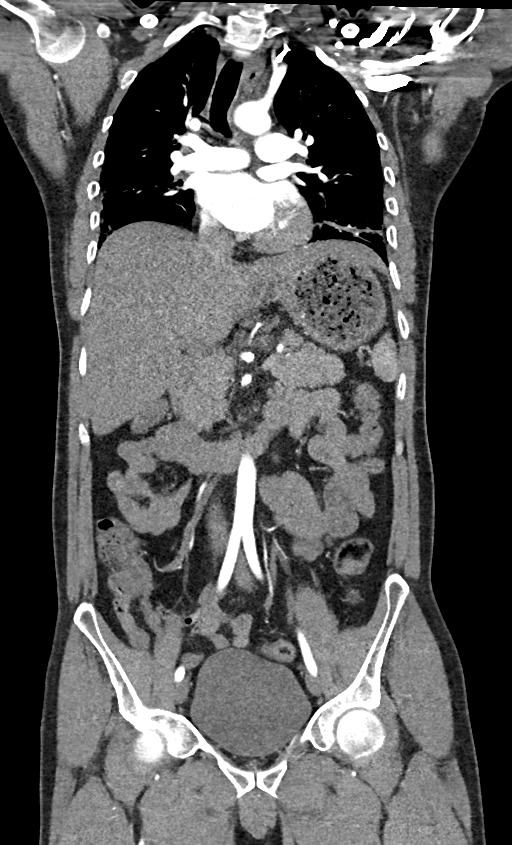
[im 100/133  soft-tissue]
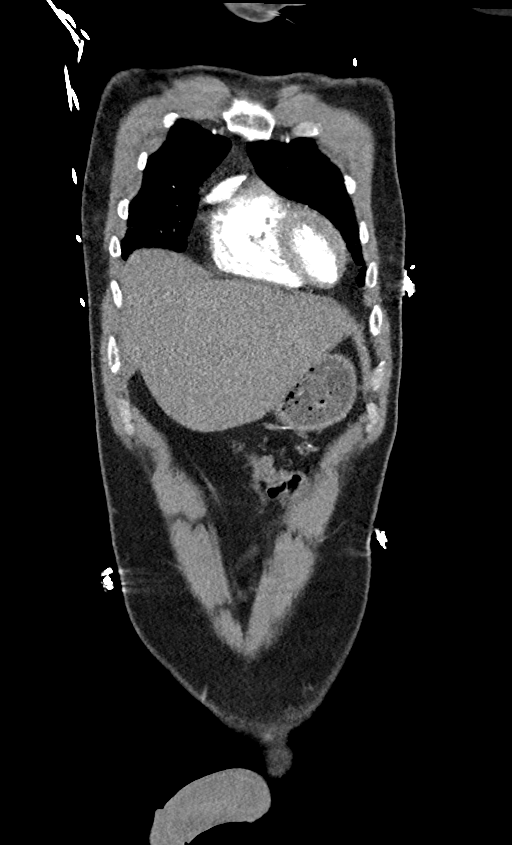

[13 of 46 positions shown; findings below may reference images not displayed]

FINDINGS: CTA CHEST FINDINGS

Cardiovascular: There is no cardiomegaly or pericardial effusion.
The thoracic aorta is unremarkable. The origins of the great vessels
of the aortic arch are patent. There is no CT evidence of pulmonary
embolism.

Mediastinum/Nodes: Top-normal bilateral hilar lymph nodes.
Subcarinal lymph node measures approximately 14 mm in short axis.
The esophagus and the thyroid gland are grossly unremarkable. No
mediastinal fluid collection.

Lungs/Pleura: Bilateral lower lobe predominant streaky densities and
peribronchial vascular thickening may represent bronchitis or viral
infection. Clinical correlation is recommended. There is no pleural
effusion or pneumothorax. The central airways are patent.

Musculoskeletal: No chest wall abnormality. No acute or significant
osseous findings.

Review of the MIP images confirms the above findings.

CTA ABDOMEN AND PELVIS FINDINGS

VASCULAR

Aorta: Normal caliber aorta without aneurysm, dissection, vasculitis
or significant stenosis.

Celiac: Patent without evidence of aneurysm, dissection, vasculitis
or significant stenosis.

SMA: Patent without evidence of aneurysm, dissection, vasculitis or
significant stenosis.

Renals: Both renal arteries are patent without evidence of aneurysm,
dissection, vasculitis, fibromuscular dysplasia or significant
stenosis. Accessory renal arteries noted inferior to the main renal
arteries bilaterally.

IMA: Patent without evidence of aneurysm, dissection, vasculitis or
significant stenosis.

Inflow: Patent without evidence of aneurysm, dissection, vasculitis
or significant stenosis.

Veins: No obvious venous abnormality within the limitations of this
arterial phase study.

Review of the MIP images confirms the above findings.

NON-VASCULAR

No intra-abdominal free air or free fluid.

Hepatobiliary: The liver is unremarkable. No intrahepatic biliary
ductal dilatation. The gallbladder is predominantly contracted but
otherwise unremarkable.

Pancreas: Unremarkable. No pancreatic ductal dilatation or
surrounding inflammatory changes.

Spleen: Normal in size without focal abnormality.

Adrenals/Urinary Tract: A 1 cm left renal upper pole as well as left
renal interpolar hypodense lesions are not well characterized,
likely cysts. These can be better characterized by ultrasound. There
is no hydronephrosis on either side. The visualized ureters and
urinary bladder appear unremarkable.

Stomach/Bowel: Stomach is within normal limits. Appendix appears
normal. No evidence of bowel wall thickening, distention, or
inflammatory changes.

Lymphatic: No adenopathy.

Reproductive: The prostate and seminal vesicles are grossly
unremarkable.

Other: None

Musculoskeletal: No acute or significant osseous findings.

Review of the MIP images confirms the above findings.
IMPRESSION: 1. Bilateral streaky and peribronchial densities may represent
bronchitis or viral infection. Clinical correlation is recommended.
2. No aortic aneurysm or dissection. No CT evidence of pulmonary
embolism.
3. No acute intra-abdominal or pelvic pathology. Indeterminate left
renal hypodense lesions, possibly cysts. Ultrasound may provide
better characterization on a nonemergent basis.

## 2018-01-01 IMAGING — DX DG CHEST 1V PORT
1 series · 1 of 1 positions shown · non-contrast
Comparison: None.

CLINICAL DATA: 41 y/o  M; centralized chest pain after syncope.

EXAM:
PORTABLE CHEST 1 VIEW

[chest ap]
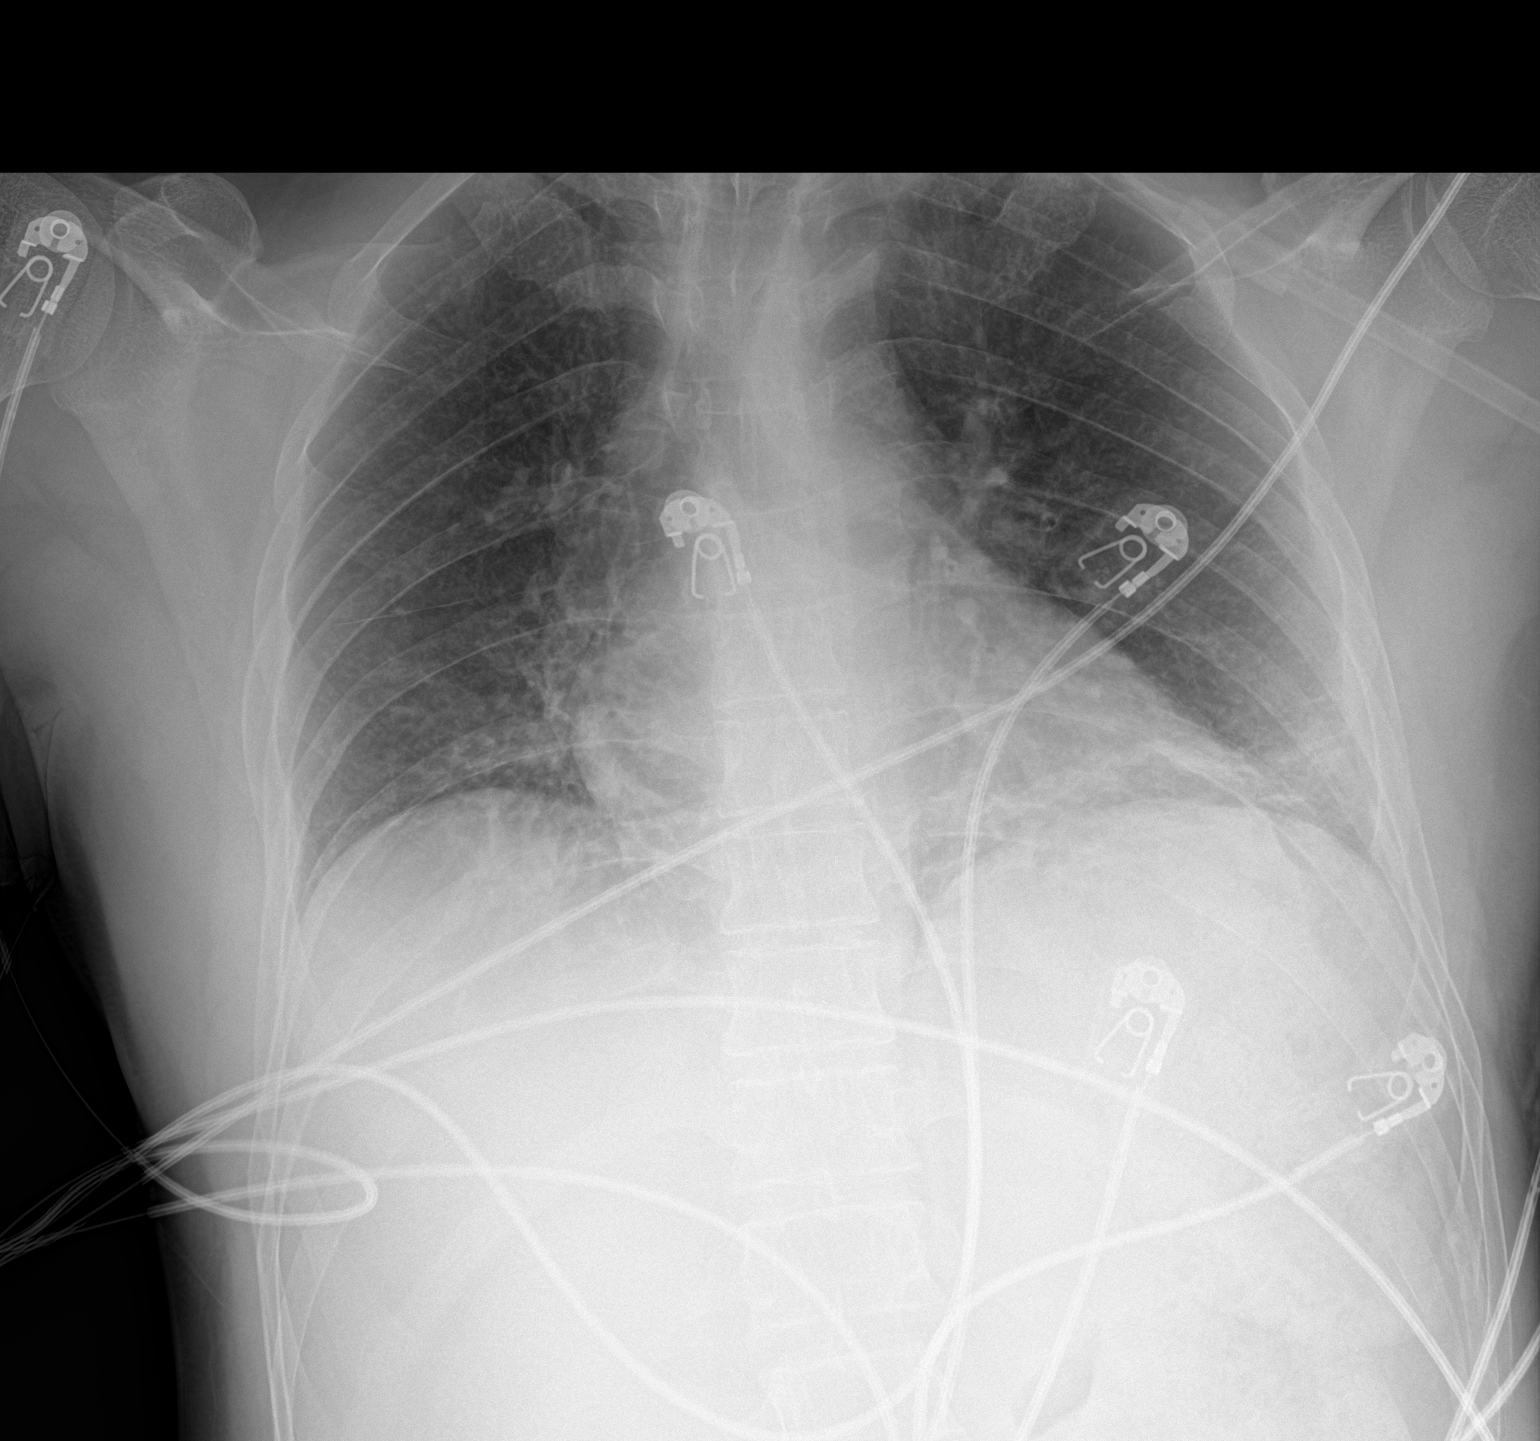

[1 of 1 positions shown; findings below may reference images not displayed]

FINDINGS: Normal cardiac silhouette. Increased pulmonary markings and patchy
opacity in the lung bases. No pleural effusion or pneumothorax. Mild
dextrocurvature of thoracic spine. No acute osseous abnormality is
evident.
IMPRESSION: Increased pulmonary markings and patchy opacities in the lung bases,
probably atelectasis or mild edema.

By: JENA M.D.

## 2018-01-01 IMAGING — CT CT HEAD W/O CM
4 of 5 series · 15 of 47 positions shown, 17 images · non-contrast
Comparison: None.

CLINICAL DATA: Syncope

EXAM:
CT HEAD WITHOUT CONTRAST
TECHNIQUE: Contiguous axial images were obtained from the base of the skull
through the vertex without intravenous contrast.

[Series 3: head without · axial · non-contrast · 0.44mm/px · z∈[-165,-60]mm · 7 of 29 slices shown, 9 images]
[im 4/29  brain]
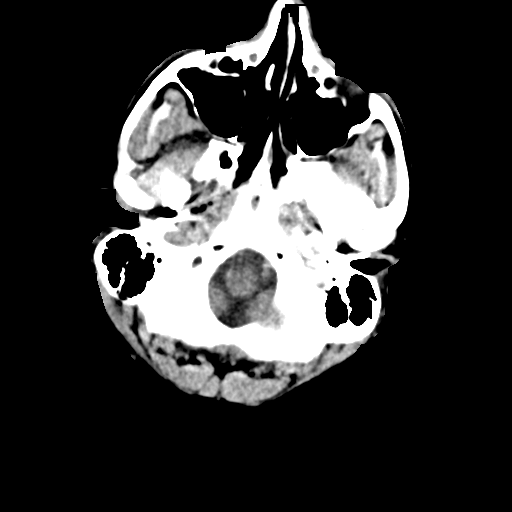
[im 4/29  bone]
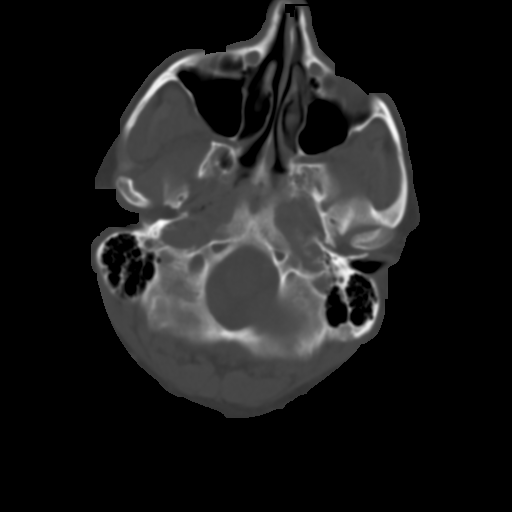
[im 8/29  brain]
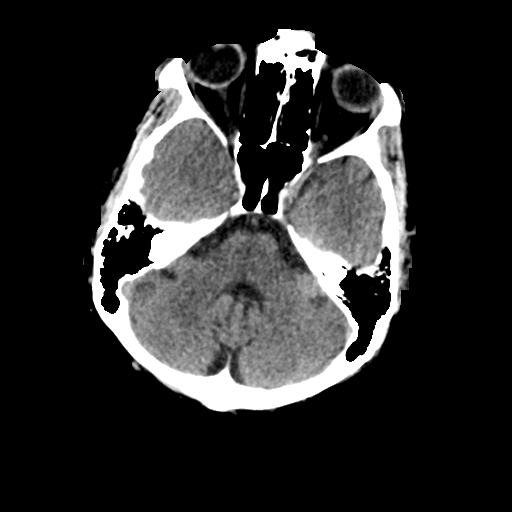
[im 11/29  brain]
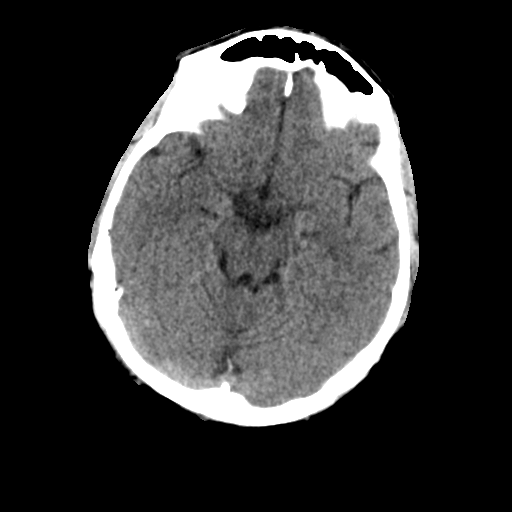
[im 15/29  brain]
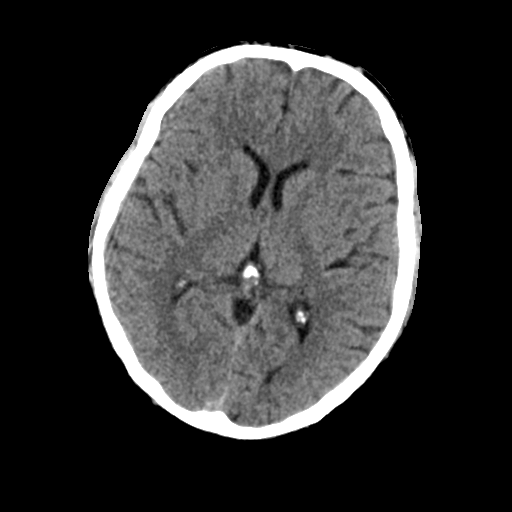
[im 18/29  brain]
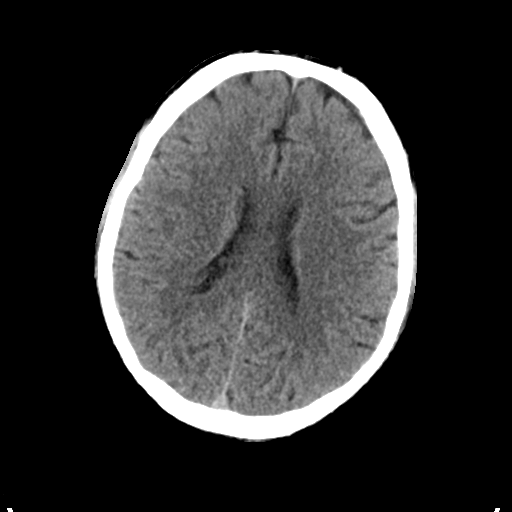
[im 18/29  bone]
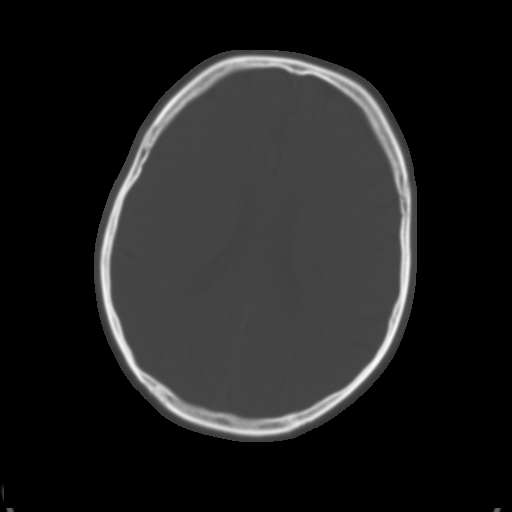
[im 22/29  brain]
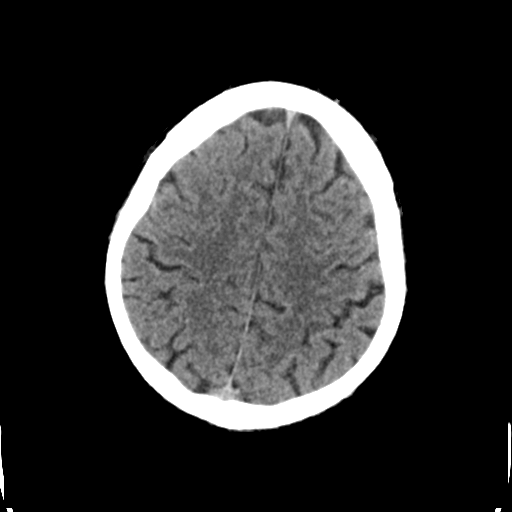
[im 25/29  brain]
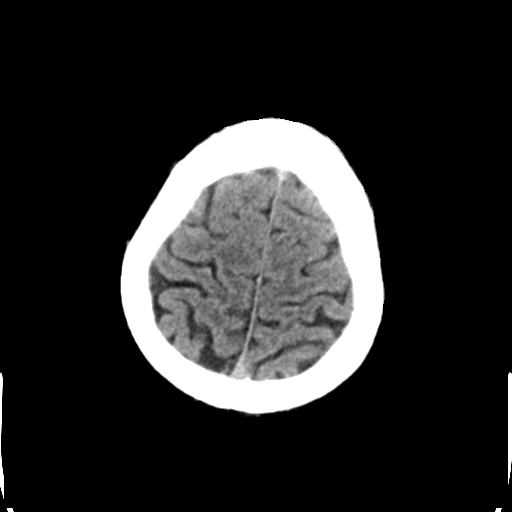

[Series 4: head bone · axial · 0.44mm/px · z∈[-166,-138]mm · 3 of 73 slices shown]
[im 8/73  bone]
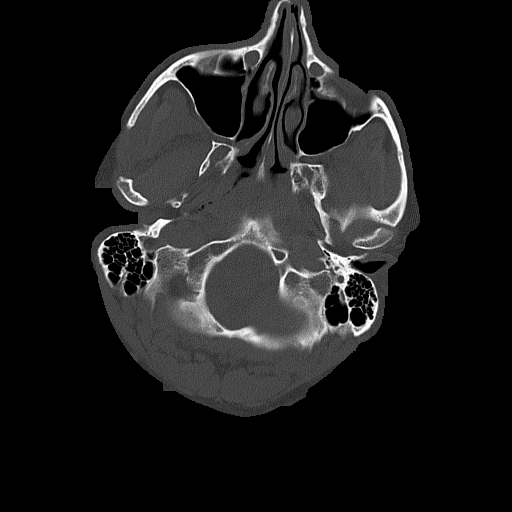
[im 15/73  bone]
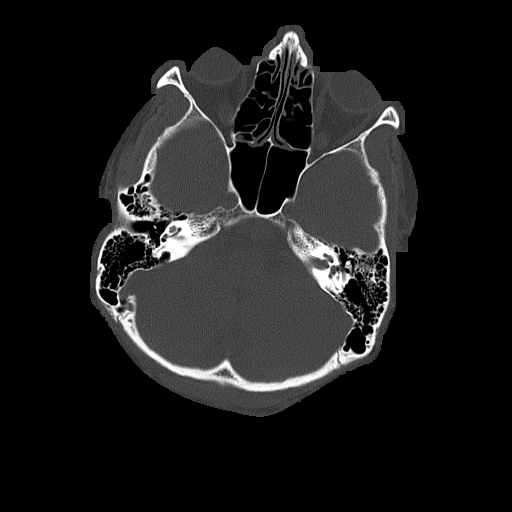
[im 22/73  bone]
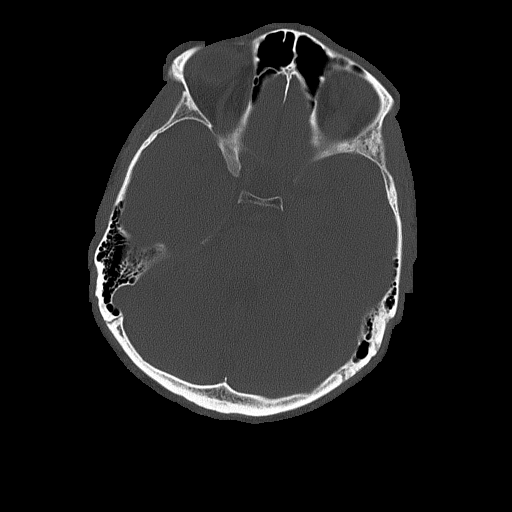

[Series 5: head without cor · coronal · non-contrast · 0.28mm/px · 3 of 71 slices shown]
[im 24/71  brain]
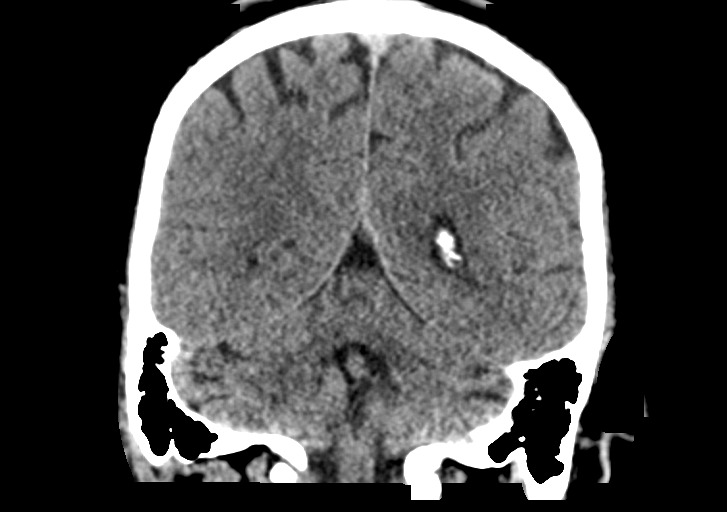
[im 32/71  brain]
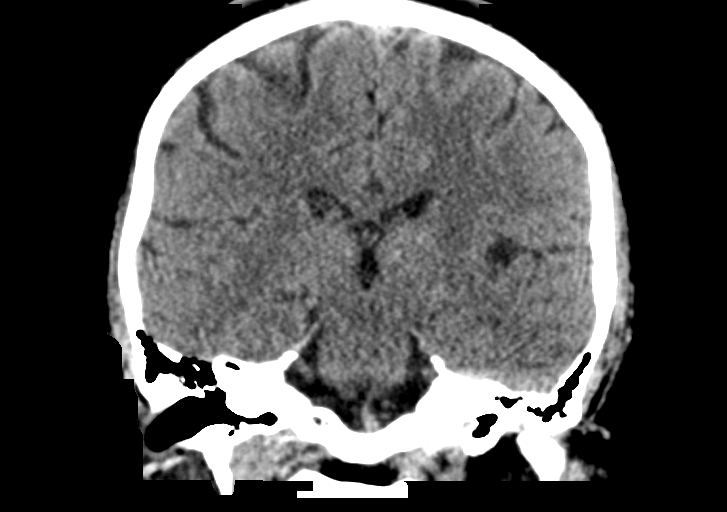
[im 39/71  brain]
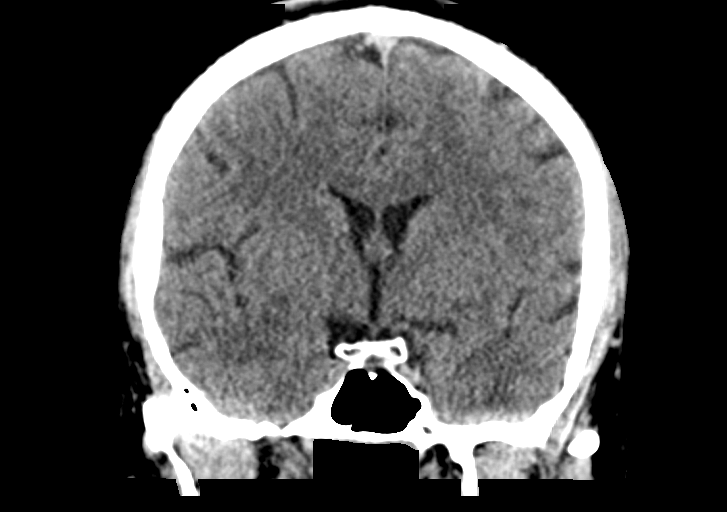

[Series 7: head without new sag · sagittal · non-contrast · 0.31mm/px · 2 of 67 slices shown]
[im 23/67  brain]
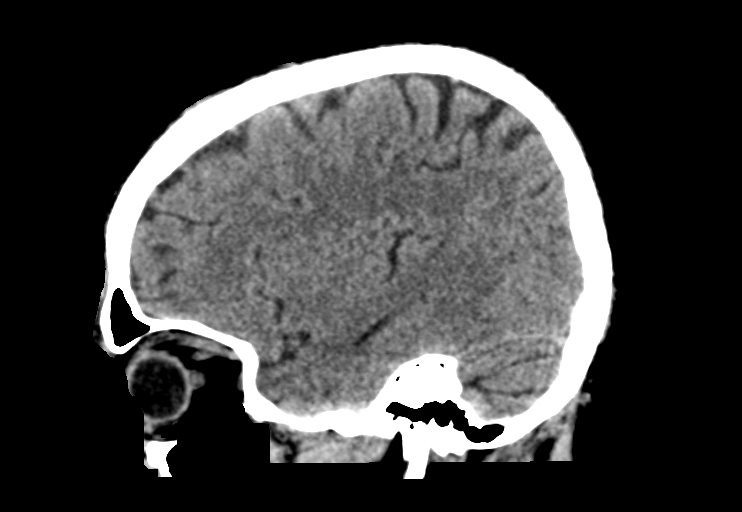
[im 45/67  brain]
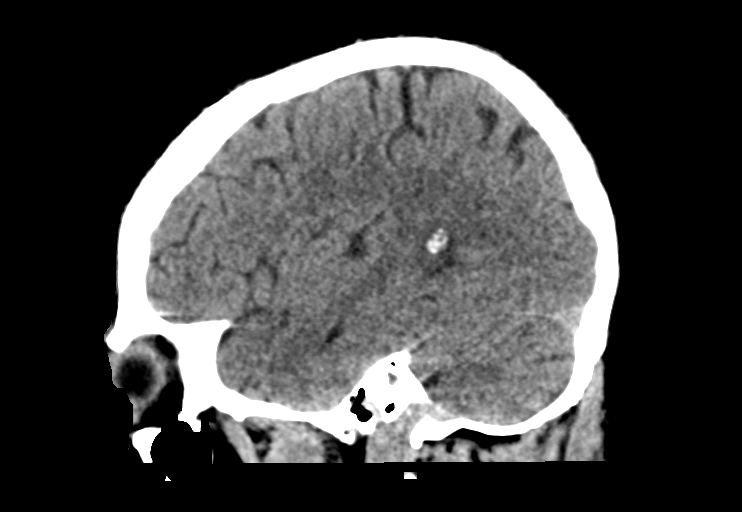

[15 of 47 positions shown; findings below may reference images not displayed]

FINDINGS: Brain: The ventricles are normal in size and configuration. There is
no intracranial mass, hemorrhage, extra-axial fluid collection, or
midline shift. Gray-white compartments appear normal. No evident
acute infarct. There is calcification in the pineal gland.

Vascular: No hyperdense vessel. No vascular calcifications are
evident.

Skull: The bony calvarium appears intact.

Sinuses/Orbits: There is mucosal thickening in several ethmoid air
cells. Other visualized paranasal sinuses are clear. Orbits appear
symmetric bilaterally.

Other: Mastoid air cells are clear.
IMPRESSION: Mucosal thickening in several ethmoid air cells. Study otherwise
unremarkable.

## 2018-01-01 MED ORDER — MECLIZINE HCL 25 MG PO TABS
25.0000 mg | ORAL_TABLET | Freq: Two times a day (BID) | ORAL | Status: DC | PRN
  Administered 2018-01-02: 25 mg via ORAL
  Filled 2018-01-01: qty 1

## 2018-01-01 MED ORDER — SODIUM CHLORIDE 0.9 % IV BOLUS
1000.0000 mL | Freq: Once | INTRAVENOUS | Status: AC
  Administered 2018-01-01: 1000 mL via INTRAVENOUS

## 2018-01-01 MED ORDER — IPRATROPIUM-ALBUTEROL 0.5-2.5 (3) MG/3ML IN SOLN: 3.0000 mL | Freq: Four times a day (QID) | RESPIRATORY_TRACT | Status: DC | PRN

## 2018-01-01 MED ORDER — IOPAMIDOL (ISOVUE-370) INJECTION 76%
INTRAVENOUS | Status: AC
  Filled 2018-01-01: qty 100

## 2018-01-01 MED ORDER — IOPAMIDOL (ISOVUE-370) INJECTION 76%
100.0000 mL | Freq: Once | INTRAVENOUS | Status: AC | PRN
  Administered 2018-01-01: 100 mL via INTRAVENOUS

## 2018-01-01 MED ORDER — MECLIZINE HCL 25 MG PO TABS
25.0000 mg | ORAL_TABLET | Freq: Once | ORAL | Status: AC
  Administered 2018-01-01: 25 mg via ORAL
  Filled 2018-01-01: qty 1

## 2018-01-01 MED ORDER — MECLIZINE HCL 25 MG PO TABS: 25.0000 mg | ORAL_TABLET | Freq: Once | ORAL | Status: DC

## 2018-01-01 MED ORDER — DM-GUAIFENESIN ER 30-600 MG PO TB12
1.0000 | ORAL_TABLET | Freq: Two times a day (BID) | ORAL | Status: DC
  Administered 2018-01-01 – 2018-01-03 (×4): 1 via ORAL
  Filled 2018-01-01 (×4): qty 1

## 2018-01-01 MED ORDER — LORAZEPAM 2 MG/ML IJ SOLN
1.0000 mg | Freq: Once | INTRAMUSCULAR | Status: AC
  Administered 2018-01-01: 1 mg via INTRAVENOUS
  Filled 2018-01-01: qty 1

## 2018-01-01 MED ORDER — ACETAMINOPHEN 325 MG PO TABS
650.0000 mg | ORAL_TABLET | Freq: Four times a day (QID) | ORAL | Status: DC | PRN
  Administered 2018-01-01 – 2018-01-03 (×3): 650 mg via ORAL
  Filled 2018-01-01 (×3): qty 2

## 2018-01-01 MED ORDER — ACETAMINOPHEN 650 MG RE SUPP: 650.0000 mg | Freq: Four times a day (QID) | RECTAL | Status: DC | PRN

## 2018-01-01 MED ORDER — SODIUM CHLORIDE 0.9 % IV SOLN
INTRAVENOUS | Status: DC
  Administered 2018-01-01 – 2018-01-03 (×4): via INTRAVENOUS

## 2018-01-01 NOTE — ED Notes (Signed)
Care assumed at this time - resting quietly on stretcher with son at bedside - pt is non-english speaking - son is translating

## 2018-01-01 NOTE — ED Triage Notes (Signed)
Per [facility], pt arrives from home with c/o centralized chest pain after a syncopal episode. EMS reports 5 minutes of downtime. Son at bedside reports pt fell and hit left shoulder. This RN witnessed an unresponsive episode of 3 minutes. Pulses maintained during episode. Pt placed on 3L [place] for comfort. Pt speaks arabic. Video translator at bedside. EMS gave 324 ASA. No NTG given

## 2018-01-01 NOTE — ED Notes (Signed)
X-ray at bedside

## 2018-01-01 NOTE — H&P (Addendum)
History and Physical    Aaron Reyes:[id number] DOB: 09/27/1975 DOA: 01/01/2018  PCP: Patient, No Pcp Per Consultants:  none Patient coming from: Home - lives with wife and young children  Chief Complaint: chest pain, sob  HPI: Aaron Reyes is a 42 y.o. male, non-english speaking [place] male. Son reports pmh of headaches and "heart attacks" when living in [place] and [place], but unable to elaborate. Only takes an occasional Excedrin for headache. Tobacco abuse ~1/2 PPD. 40 year old son, Arbutus Ped, is translating for pt. He states while at home last night around 1900 pt suddenly began c/o chest pain, dizziness and difficulty breathing. Son denies syncopal event, but brought pt to ED for further evaluation. Hx is limited due to language barrier and pt disposition. Son speaks fluent English and assisted throughout exam. Pt states symptoms unchanged since arrival to ED. When awakened and asked to sit up he moans and grabs chest and begins breathing heavily stating he feels short of breath and dizzy. He and son deny recent illness, headache, fever, cough, abdominal pain, n/v/d, no recent travel.    ED Course: CBC and BMET unremarkable, trop poc negative x 2, no ischemic changes on ekg, uds unremarkable, chest xray shows mild bilateral atx vs mild edema but  Further clarified with CT angio chest showing streaky peribronchial densities c/w viral infection or bronchitis. CT head unremarkable. No evidence of PE. VSS. Attempted to ambulate pt multiple times including twice by myself. Pt immediately becomes dizzy and unable to stand or ambulate falling back on bed.   Review of Systems: As per HPI; otherwise review of systems reviewed and negative.   Ambulatory Status: Ambulates without assistance at baseline  Past Medical History:  Diagnosis Date  . Myocardial infarct [facility])      Social History   Socioeconomic History  . Marital status: Married    Spouse name: Not on file  . Number of  children: Not on file  . Years of education: Not on file  . Highest education level: Not on file  Occupational History  . Not on file  Social Needs  . Financial resource strain: Not on file  . Food insecurity:    Worry: Not on file    Inability: Not on file  . Transportation needs:    Medical: Not on file    Non-medical: Not on file  Tobacco Use  . Smoking status: Current Every Day Smoker    Types: Cigarettes  Substance and Sexual Activity  . Alcohol use: Not on file  . Drug use: Not on file  . Sexual activity: Not on file  Lifestyle  . Physical activity:    Days per week: Not on file    Minutes per session: Not on file  . Stress: Not on file  Relationships  . Social connections:    Talks on phone: Not on file    Gets together: Not on file    Attends religious service: Not on file    Active member of club or organization: Not on file    Attends meetings of clubs or organizations: Not on file    Relationship status: Not on file  . Intimate partner violence:    Fear of current or ex partner: Not on file    Emotionally abused: Not on file    Physically abused: Not on file    Forced sexual activity: Not on file  Other Topics Concern  . Not on file  Social History Narrative  .  Not on file    Allergies not on file  No family history on file.  Prior to Admission medications   Not on File    Physical Exam: Vitals:   01/01/18 0630 01/01/18 0645 01/01/18 0841 01/01/18 0845  BP: (!) 136/95 (!) 129/92 (!) 120/96 123/86  Pulse: 74 74 81 79  Resp: 15 16 20    Temp:   97.9 F (36.6 C)   TempSrc:   Oral   SpO2: 99% 97% 95% 92%  Weight:         General: Sleeping comfortably on my arrival to room and becomes anxious when awakened.  Eyes: PERRL, EOMI, normal lids, iris ENT: grossly normal hearing, lips & tongue, mmm; appropriate dentition, TMs clear bilaterally without erythema or exudate Neck: no LAD, masses or thyromegaly; no carotid bruits Cardiovascular: S1S2  RRR, no m/r/g. No LE edema.  Respiratory:  CTA bilaterally with no wheezes/rales/rhonchi.  Diminished bilateral lower lobes but decreased effort.  Abdomen: soft, NT, ND, NABS Back:  normal alignment, no CVAT Skin: no rash or induration seen on limited exam Musculoskeletal: grossly normal tone BUE/BLE, good ROM, no bony abnormality Lower extremity: No LE edema.  Limited foot exam with no ulcerations.  2+ distal pulses. Psychiatric: anxious normal mood and affect, speech fluent and appropriate, AOx3 Neurologic: CN 2-12 grossly intact, moves all extremities in coordinated fashion, sensation intact    Radiological Exams on Admission: Ct Head Wo Contrast  Result Date: 01/01/2018 CLINICAL DATA:  Syncope EXAM: CT HEAD WITHOUT CONTRAST TECHNIQUE: Contiguous axial images were obtained from the base of the skull through the vertex without intravenous contrast. COMPARISON:  None. FINDINGS: Brain: The ventricles are normal in size and configuration. There is no intracranial mass, hemorrhage, extra-axial fluid collection, or midline shift. Gray-white compartments appear normal. No evident acute infarct. There is calcification in the pineal gland. Vascular: No hyperdense vessel. No vascular calcifications are evident. Skull: The bony calvarium appears intact. Sinuses/Orbits: There is mucosal thickening in several ethmoid air cells. Other visualized paranasal sinuses are clear. Orbits appear symmetric bilaterally. Other: Mastoid air cells are clear. IMPRESSION: Mucosal thickening in several ethmoid air cells. Study otherwise unremarkable. Electronically Signed   By: Bretta Bang III M.D.   On: 01/01/2018 11:32   Dg Chest Port 1 View  Result Date: 01/01/2018 CLINICAL DATA:  42 y/o  M; centralized chest pain after syncope. EXAM: PORTABLE CHEST 1 VIEW COMPARISON:  None. FINDINGS: Normal cardiac silhouette. Increased pulmonary markings and patchy opacity in the lung bases. No pleural effusion or pneumothorax.  Mild dextrocurvature of thoracic spine. No acute osseous abnormality is evident. IMPRESSION: Increased pulmonary markings and patchy opacities in the lung bases, probably atelectasis or mild edema. Electronically Signed   By: Mitzi Hansen M.D.   On: 01/01/2018 02:23   Ct Angio Chest/abd/pel For Dissection W And/or Wo Contrast  Result Date: 01/01/2018 CLINICAL DATA:  42 year old male with chest and back pain. Concern for aortic dissection. EXAM: CT ANGIOGRAPHY CHEST, ABDOMEN AND PELVIS TECHNIQUE: Multidetector CT imaging through the chest, abdomen and pelvis was performed using the standard protocol during bolus administration of intravenous contrast. Multiplanar reconstructed images and MIPs were obtained and reviewed to evaluate the vascular anatomy. CONTRAST:  ISOVUE-370 IOPAMIDOL (ISOVUE-370) INJECTION 76% COMPARISON:  Chest radiograph dated 01/01/2018 FINDINGS: CTA CHEST FINDINGS Cardiovascular: There is no cardiomegaly or pericardial effusion. The thoracic aorta is unremarkable. The origins of the great vessels of the aortic arch are patent. There is no CT evidence of pulmonary embolism.  Mediastinum/Nodes: Top-normal bilateral hilar lymph nodes. Subcarinal lymph node measures approximately 14 mm in short axis. The esophagus and the thyroid gland are grossly unremarkable. No mediastinal fluid collection. Lungs/Pleura: Bilateral lower lobe predominant streaky densities and peribronchial vascular thickening may represent bronchitis or viral infection. Clinical correlation is recommended. There is no pleural effusion or pneumothorax. The central airways are patent. Musculoskeletal: No chest wall abnormality. No acute or significant osseous findings. Review of the MIP images confirms the above findings. CTA ABDOMEN AND PELVIS FINDINGS VASCULAR Aorta: Normal caliber aorta without aneurysm, dissection, vasculitis or significant stenosis. Celiac: Patent without evidence of aneurysm, dissection,  vasculitis or significant stenosis. SMA: Patent without evidence of aneurysm, dissection, vasculitis or significant stenosis. Renals: Both renal arteries are patent without evidence of aneurysm, dissection, vasculitis, fibromuscular dysplasia or significant stenosis. Accessory renal arteries noted inferior to the main renal arteries bilaterally. IMA: Patent without evidence of aneurysm, dissection, vasculitis or significant stenosis. Inflow: Patent without evidence of aneurysm, dissection, vasculitis or significant stenosis. Veins: No obvious venous abnormality within the limitations of this arterial phase study. Review of the MIP images confirms the above findings. NON-VASCULAR No intra-abdominal free air or free fluid. Hepatobiliary: The liver is unremarkable. No intrahepatic biliary ductal dilatation. The gallbladder is predominantly contracted but otherwise unremarkable. Pancreas: Unremarkable. No pancreatic ductal dilatation or surrounding inflammatory changes. Spleen: Normal in size without focal abnormality. Adrenals/Urinary Tract: A 1 cm left renal upper pole as well as left renal interpolar hypodense lesions are not well characterized, likely cysts. These can be better characterized by ultrasound. There is no hydronephrosis on either side. The visualized ureters and urinary bladder appear unremarkable. Stomach/Bowel: Stomach is within normal limits. Appendix appears normal. No evidence of bowel wall thickening, distention, or inflammatory changes. Lymphatic: No adenopathy. Reproductive: The prostate and seminal vesicles are grossly unremarkable. Other: None Musculoskeletal: No acute or significant osseous findings. Review of the MIP images confirms the above findings. IMPRESSION: 1. Bilateral streaky and peribronchial densities may represent bronchitis or viral infection. Clinical correlation is recommended. 2. No aortic aneurysm or dissection. No CT evidence of pulmonary embolism. 3. No acute  intra-abdominal or pelvic pathology. Indeterminate left renal hypodense lesions, possibly cysts. Ultrasound may provide better characterization on a nonemergent basis. Electronically Signed   By: Elgie CollardArash  Radparvar M.D.   On: 01/01/2018 05:29    EKG: Independently reviewed.  NSR with rate ; nonspecific ST changes with no evidence of acute ischemia. C/w LVH   Labs on Admission: I have personally reviewed the available labs and imaging studies at the time of the admission.  Pertinent labs:  CBC, BMET wnl Trop poc negative x 2 ddimer 0.36    Assessment/Plan Principal Problem:   Dyspnea Active Problems:   Chest pain, pleuritic   Tobacco abuse    Chest pain and dyspnea -pain appears pleuritic in nature and very atypical of cardiac etiology. Trop poc - x 2, no ischemic findings on ekg. Pt has been in ED for well over 12 hours. No further cardiac w/u plan.  -CT angio negative PE. No evidence of pna -No evidence of volume overload on exam -Difficult to discharge from ED as pt states he becomes too sob and dizzy to walk -supportive care for viral illness, prn nebs, mucinex, gentle IVFs  Dizziness -CT head negative -prn meclizine, supportive care  Tobacco abuse -nicotine patch -pt counseled through translator   DVT prophylaxis: early ambulation Code Status: full Family Communication: son at bedside on admission  Disposition Plan: Home once clinically  improved Consults called: none  Admission status: obs   Cordelia Pen, NP-C [facility]  pgr [phone]   If note is complete, please contact covering daytime or nighttime physician. [email or web address] Password [facility]  01/01/2018, 3:39 PM

## 2018-01-01 NOTE — ED Notes (Signed)
Patient transported to CT 

## 2018-01-01 NOTE — ED Notes (Signed)
Admitting team in to assess 

## 2018-01-01 NOTE — Progress Notes (Addendum)
New Admission Note:  Patient transferred from the [facility] to room 5M14  Arrival Method: via stretcher Mental Orientation: Alert oriented, speaks arabic through son Telemetry: N/A Assessment: Completed Skin: Intact IV: L hand / L AC Pain: 8 out 10 to chest, worse with respirations page to Dr. Ophelia CharterYates for notification Tubes: None Safety Measures: Safety Fall Prevention Plan has been discussed  Admission: To be completed [facility] [place] Orientation: Patient has been orientated to the room, unit and staff.   Family: Son at bedside  Orders to be reviewed and implemented. Will continue to monitor the patient. Call light has been placed within reach and bed alarm has been activated.   Burley SaverKami Margee Trentham, BSN, RN-BC Phone: [phone]

## 2018-01-01 NOTE — ED Provider Notes (Signed)
[facility] EMERGENCY DEPARTMENT Provider Note   CSN: [id number] Arrival date & time: 01/01/18  0138     History   Chief Complaint Chief Complaint  Patient presents with  . Chest Pain    HPI Aaron Reyes is a 42 y.o. male.  HPI  This is a 42 year old male with a reported history of MI who presents with an episode of syncope.  History obtained by interpreter; however, patient provides limited history.  He does have a young family member at the bedside who provides some collateral information.  EMS also provides collateral information.  Per report, patient was noted to have a syncopal episode at home.  He was unresponsive for approximately 5 minutes.  He since that time has been complaining of centralized chest pain.  It does not radiate.  No known recent fevers or cough.  Patient provides very limited history.  Reports generalized weakness.  Level 5 caveat  Past Medical History:  Diagnosis Date  . Myocardial infarct (HCC)     There are no active problems to display for this patient.       Home Medications    Prior to Admission medications   Not on File    Family History No family history on file.  Social History Social History   Tobacco Use  . Smoking status: Current Every Day Smoker    Types: Cigarettes  Substance Use Topics  . Alcohol use: Not on file  . Drug use: Not on file     Allergies   Patient has no allergy information on record.   Review of Systems Review of Systems  Constitutional: Negative for fever.  Respiratory: Negative for shortness of breath.   Cardiovascular: Positive for chest pain.  Genitourinary: Positive for dysuria.  Musculoskeletal:       Shoulder pain  Neurological: Positive for syncope.  All other systems reviewed and are negative.    Physical Exam Updated Vital Signs BP 125/82   Pulse 78   Resp (!) 21   Wt 68 kg (150 lb)   SpO2 99%   Physical Exam  Constitutional: He is oriented to  person, place, and time.  Ill-appearing, eyes closed, holding his chest  HENT:  Head: Normocephalic and atraumatic.  Eyes: Pupils are equal, round, and reactive to light.  Bilateral injected conjunctiva  Neck: Neck supple.  Cardiovascular: Normal rate, regular rhythm, normal heart sounds and normal pulses.  No murmur heard. Pulmonary/Chest: Effort normal and breath sounds normal. No respiratory distress. He has no wheezes.  Abdominal: Soft. Bowel sounds are normal. There is no tenderness. There is no rebound.  Musculoskeletal: He exhibits no edema.  Lymphadenopathy:    He has no cervical adenopathy.  Neurological: He is alert and oriented to person, place, and time.  Skin: Skin is warm and dry.  Psychiatric: He has a normal mood and affect.  Nursing note and vitals reviewed.    ED Treatments / Results  Labs (all labs ordered are listed, but only abnormal results are displayed) Labs Reviewed  BASIC METABOLIC PANEL - Abnormal; Notable for the following components:      Result Value   Sodium 146 (*)    Chloride 114 (*)    Glucose, Bld 109 (*)    All other components within normal limits  RAPID URINE DRUG SCREEN, HOSP PERFORMED - Abnormal; Notable for the following components:   Barbiturates   (*)    Value: Result not available. Reagent lot number recalled by manufacturer.  All other components within normal limits  CBG MONITORING, ED - Abnormal; Notable for the following components:   Glucose-Capillary 111 (*)    All other components within normal limits  CBC  D-DIMER, QUANTITATIVE (NOT AT [facility])  I-STAT TROPONIN, ED  I-STAT TROPONIN, ED    EKG EKG Interpretation  Date/Time:  Wednesday January 01 2018 01:51:20 EDT Ventricular Rate:  83 PR Interval:    QRS Duration: 85 QT Interval:  364 QTC Calculation: 428 R Axis:   46 Text Interpretation:  Sinus rhythm Abnormal R-wave progression, early transition Probable left ventricular hypertrophy Confirmed by Ross MarcusHorton, Courtney  [id number]([id number]) on 01/01/2018 5:37:14 AM   Radiology Dg Chest Port 1 View  Result Date: 01/01/2018 CLINICAL DATA:  42 y/o  M; centralized chest pain after syncope. EXAM: PORTABLE CHEST 1 VIEW COMPARISON:  None. FINDINGS: Normal cardiac silhouette. Increased pulmonary markings and patchy opacity in the lung bases. No pleural effusion or pneumothorax. Mild dextrocurvature of thoracic spine. No acute osseous abnormality is evident. IMPRESSION: Increased pulmonary markings and patchy opacities in the lung bases, probably atelectasis or mild edema. Electronically Signed   By: Mitzi HansenLance  Furusawa-Stratton M.D.   On: 01/01/2018 02:23   Ct Angio Chest/abd/pel For Dissection W And/or Wo Contrast  Result Date: 01/01/2018 CLINICAL DATA:  42 year old male with chest and back pain. Concern for aortic dissection. EXAM: CT ANGIOGRAPHY CHEST, ABDOMEN AND PELVIS TECHNIQUE: Multidetector CT imaging through the chest, abdomen and pelvis was performed using the standard protocol during bolus administration of intravenous contrast. Multiplanar reconstructed images and MIPs were obtained and reviewed to evaluate the vascular anatomy. CONTRAST:  100mL ISOVUE-370 IOPAMIDOL (ISOVUE-370) INJECTION 76% COMPARISON:  Chest radiograph dated 01/01/2018 FINDINGS: CTA CHEST FINDINGS Cardiovascular: There is no cardiomegaly or pericardial effusion. The thoracic aorta is unremarkable. The origins of the great vessels of the aortic arch are patent. There is no CT evidence of pulmonary embolism. Mediastinum/Nodes: Top-normal bilateral hilar lymph nodes. Subcarinal lymph node measures approximately 14 mm in short axis. The esophagus and the thyroid gland are grossly unremarkable. No mediastinal fluid collection. Lungs/Pleura: Bilateral lower lobe predominant streaky densities and peribronchial vascular thickening may represent bronchitis or viral infection. Clinical correlation is recommended. There is no pleural effusion or pneumothorax. The central  airways are patent. Musculoskeletal: No chest wall abnormality. No acute or significant osseous findings. Review of the MIP images confirms the above findings. CTA ABDOMEN AND PELVIS FINDINGS VASCULAR Aorta: Normal caliber aorta without aneurysm, dissection, vasculitis or significant stenosis. Celiac: Patent without evidence of aneurysm, dissection, vasculitis or significant stenosis. SMA: Patent without evidence of aneurysm, dissection, vasculitis or significant stenosis. Renals: Both renal arteries are patent without evidence of aneurysm, dissection, vasculitis, fibromuscular dysplasia or significant stenosis. Accessory renal arteries noted inferior to the main renal arteries bilaterally. IMA: Patent without evidence of aneurysm, dissection, vasculitis or significant stenosis. Inflow: Patent without evidence of aneurysm, dissection, vasculitis or significant stenosis. Veins: No obvious venous abnormality within the limitations of this arterial phase study. Review of the MIP images confirms the above findings. NON-VASCULAR No intra-abdominal free air or free fluid. Hepatobiliary: The liver is unremarkable. No intrahepatic biliary ductal dilatation. The gallbladder is predominantly contracted but otherwise unremarkable. Pancreas: Unremarkable. No pancreatic ductal dilatation or surrounding inflammatory changes. Spleen: Normal in size without focal abnormality. Adrenals/Urinary Tract: A 1 cm left renal upper pole as well as left renal interpolar hypodense lesions are not well characterized, likely cysts. These can be better characterized by ultrasound. There is no hydronephrosis on either side. The  visualized ureters and urinary bladder appear unremarkable. Stomach/Bowel: Stomach is within normal limits. Appendix appears normal. No evidence of bowel wall thickening, distention, or inflammatory changes. Lymphatic: No adenopathy. Reproductive: The prostate and seminal vesicles are grossly unremarkable. Other: None  Musculoskeletal: No acute or significant osseous findings. Review of the MIP images confirms the above findings. IMPRESSION: 1. Bilateral streaky and peribronchial densities may represent bronchitis or viral infection. Clinical correlation is recommended. 2. No aortic aneurysm or dissection. No CT evidence of pulmonary embolism. 3. No acute intra-abdominal or pelvic pathology. Indeterminate left renal hypodense lesions, possibly cysts. Ultrasound may provide better characterization on a nonemergent basis. Electronically Signed   By: Elgie Collard M.D.   On: 01/01/2018 05:29    Procedures Procedures (including critical care time)  Medications Ordered in ED Medications  iopamidol (ISOVUE-370) 76 % injection (has no administration in time range)  sodium chloride 0.9 % bolus 1,000 mL (has no administration in time range)  meclizine (ANTIVERT) tablet 25 mg (has no administration in time range)  sodium chloride 0.9 % bolus 1,000 mL (0 mLs Intravenous Stopped 01/01/18 0342)  iopamidol (ISOVUE-370) 76 % injection 100 mL (100 mLs Intravenous Contrast Given 01/01/18 0422)  LORazepam (ATIVAN) injection 1 mg (1 mg Intravenous Given 01/01/18 1610)     Initial Impression / Assessment and Plan / ED Course  I have reviewed the triage vital signs and the nursing notes.  Pertinent labs & imaging results that were available during my care of the patient were reviewed by me and considered in my medical decision making (see chart for details).     Presents with reported chest pain and syncopal episode.  He provides very minimal history.  Reports a history of recent MI.  He appears uncomfortable and is clutching his chest.  His vital signs are initially reassuring.  He is not orthostatic.  He is not febrile or hypertensive.  EKG shows no evidence of arrhythmia or ischemia.  Work-up initiated including cardiac testing and d-dimer.  Troponin is negative initially.  D-dimer is negative.  CT scan for dissection  obtained which shows no evidence of dissection.  No evidence of PE.  No lung parenchymal issues.  No pericardial effusion noted.  On multiple rechecks, patient is resting but when you wake him up he begins to moan and complain of chest pain.  Question anxiety component.  Repeat troponin was obtained and is negative.  Patient was given a dose of Ativan.  When attempted ambulation by nursing, patient stated that he felt dizzy.  He is not orthostatic.  He has received 1 L of fluids.  Will provide with an additional liter of fluid.  At this time there is no clear etiology of his symptoms and his work-up is largely reassuring.  Emergent causes of chest pain and syncope have been out.  Patient signed out pending recheck and ambulation.  Final Clinical Impressions(s) / ED Diagnoses   Final diagnoses:  Syncope, unspecified syncope type  Chest pain, unspecified type    ED Discharge Orders    None       Shon Baton, MD 01/01/18 [phone]

## 2018-01-01 NOTE — ED Provider Notes (Signed)
I attempted to sit patient up.  Patient felt very vertiginous and was unable to stand.  Will consult general medicine for admission.   Donnetta Hutchingook, Tylan Kinn, MD 01/01/18 1256

## 2018-01-01 NOTE — ED Notes (Signed)
CBG 111 

## 2018-01-01 NOTE — ED Notes (Signed)
Pt went to CT

## 2018-01-01 NOTE — ED Notes (Signed)
Phlebotomy at bedside.

## 2018-01-01 NOTE — Progress Notes (Signed)
Patient reports he finished his trazadone 1 month ago and has not been able to refill it or see the doctor. Dondra SpryMoore, Iisha Soyars Islee, RN

## 2018-01-01 NOTE — ED Notes (Signed)
ED Provider at bedside. 

## 2018-01-01 NOTE — ED Notes (Signed)
Pt given water 

## 2018-01-02 ENCOUNTER — Ambulatory Visit (HOSPITAL_BASED_OUTPATIENT_CLINIC_OR_DEPARTMENT_OTHER): Payer: Medicaid Other

## 2018-01-02 DIAGNOSIS — R55 Syncope and collapse: Secondary | ICD-10-CM | POA: Diagnosis present

## 2018-01-02 DIAGNOSIS — R0600 Dyspnea, unspecified: Secondary | ICD-10-CM | POA: Diagnosis not present

## 2018-01-02 DIAGNOSIS — R42 Dizziness and giddiness: Secondary | ICD-10-CM | POA: Diagnosis present

## 2018-01-02 DIAGNOSIS — R079 Chest pain, unspecified: Secondary | ICD-10-CM | POA: Diagnosis present

## 2018-01-02 DIAGNOSIS — E119 Type 2 diabetes mellitus without complications: Secondary | ICD-10-CM | POA: Diagnosis present

## 2018-01-02 DIAGNOSIS — Z720 Tobacco use: Secondary | ICD-10-CM | POA: Diagnosis not present

## 2018-01-02 DIAGNOSIS — N4830 Priapism, unspecified: Secondary | ICD-10-CM | POA: Diagnosis present

## 2018-01-02 DIAGNOSIS — R0789 Other chest pain: Secondary | ICD-10-CM | POA: Diagnosis not present

## 2018-01-02 DIAGNOSIS — E785 Hyperlipidemia, unspecified: Secondary | ICD-10-CM | POA: Diagnosis present

## 2018-01-02 DIAGNOSIS — N179 Acute kidney failure, unspecified: Secondary | ICD-10-CM | POA: Diagnosis present

## 2018-01-02 DIAGNOSIS — I252 Old myocardial infarction: Secondary | ICD-10-CM | POA: Diagnosis not present

## 2018-01-02 DIAGNOSIS — F419 Anxiety disorder, unspecified: Principal | ICD-10-CM

## 2018-01-02 DIAGNOSIS — Z91018 Allergy to other foods: Secondary | ICD-10-CM | POA: Diagnosis not present

## 2018-01-02 DIAGNOSIS — F17210 Nicotine dependence, cigarettes, uncomplicated: Secondary | ICD-10-CM | POA: Diagnosis present

## 2018-01-02 LAB — BASIC METABOLIC PANEL
Anion gap: 7 (ref 5–15)
BUN: 11 mg/dL (ref 6–20)
CO2: 24 mmol/L (ref 22–32)
CREATININE: 0.74 mg/dL (ref 0.61–1.24)
Calcium: 8.4 mg/dL — ABNORMAL LOW (ref 8.9–10.3)
Chloride: 109 mmol/L (ref 98–111)
Glucose, Bld: 137 mg/dL — ABNORMAL HIGH (ref 70–99)
POTASSIUM: 3.7 mmol/L (ref 3.5–5.1)
SODIUM: 140 mmol/L (ref 135–145)

## 2018-01-02 LAB — HIV ANTIBODY (ROUTINE TESTING W REFLEX): HIV SCREEN 4TH GENERATION: NONREACTIVE

## 2018-01-02 LAB — ECHOCARDIOGRAM COMPLETE: WEIGHTICAEL: 2400 [oz_av]

## 2018-01-02 NOTE — Evaluation (Signed)
Physical Therapy Evaluation Patient Details Name: Aaron Reyes MRN: [id number] DOB: 05-Jan-1976 Today's Date: 01/02/2018   History of Present Illness  Patient is a 42 y/o [place] male admitted due to dizziness, chest pain, fall at home and difficulty discharging due to inability to walk.   Clinical Impression  Patient presents with symptoms consistent with R vestibular hypofunction.  He is extremely anxious about his symptoms and is unable to compensate successfully at this time and is high fall risk.  Feel he needs psychological evaluation and medication in addition to vestibular rehabilitation.  Difficulty with education, initiating exercises and using visual compensation due to his level of anxiety and actually holding his breath near the end of session.  Feel he needs continued skilled PT during acute stay and follow up HHPT with vestibular trained PT at d/c (if able to get charity care).     Follow Up Recommendations Home health PT;Supervision/Assistance - 24 hour    Equipment Recommendations  Rolling walker with 5" wheels    Recommendations for Other Services       Precautions / Restrictions Precautions Precautions: Fall      Mobility  Bed Mobility Overal bed mobility: Needs Assistance Bed Mobility: Supine to Sit     Supine to sit: Min assist     General bed mobility comments: pt reaching out to be assisted into sitting, cues to use railing  Transfers Overall transfer level: Needs assistance Equipment used: None Transfers: Sit to/from Stand Sit to Stand: Supervision         General transfer comment: stands initially on his own, then falls back onto the bed reporting this is what initiates his dizziness  Ambulation/Gait Ambulation/Gait assistance: Min assist;Mod assist Gait Distance (Feet): 120 Feet Assistive device: Rolling walker (2 wheeled) Gait Pattern/deviations: Step-through pattern;Step-to pattern;Decreased stride length     General Gait Details: at  times leans forward pushing walker too far out and almost falls forward, but for assist to right posture and stand inside walker; cues for positioning in walker and to keep eyes focused on walker for visual compensation  Stairs            Wheelchair Mobility    Modified Rankin (Stroke Patients Only)       Balance Overall balance assessment: Needs assistance   Sitting balance-Leahy Scale: Good       Standing balance-Leahy Scale: Poor Standing balance comment: unable to stand without assist                             Pertinent Vitals/Pain Pain Assessment: Faces Faces Pain Scale: Hurts little more Pain Location: holding his chest at times Pain Descriptors / Indicators: Discomfort;Grimacing Pain Intervention(s): Monitored during session    Home Living Family/patient expects to be discharged to:: Private residence Living Arrangements: Spouse/significant other;Children;Other relatives Available Help at Discharge: Family Type of Home: House Home Access: Stairs to enter Entrance Stairs-Rails: None Entrance Stairs-Number of Steps: 4 Home Layout: One level        Prior Function Level of Independence: Independent               Hand Dominance        Extremity/Trunk Assessment   Upper Extremity Assessment Upper Extremity Assessment: Overall WFL for tasks assessed    Lower Extremity Assessment Lower Extremity Assessment: Overall WFL for tasks assessed       Communication   Communication: Prefers language other than [place];Interpreter utilized(Simone Stratus Video interpreter)  Cognition Arousal/Alertness: Awake/alert Behavior During Therapy: Anxious Overall Cognitive Status: Difficult to assess                                        General Comments General comments (skin integrity, edema, etc.): Educated with interpreter on unilateral vestibular hypofunction and initiated education on HHPT and x 1 viewing exercises; pt pt  became somatic/symptomatic and required cues for slowing his breathing and to relax.  With the help of RN, Mathis Fare, calmed pt after he held his breath several seconds and stopped responding with SpO2 drop into 70's. then gasping for breath to slowly recover to baseline.    Vestibular Assessment - 01/02/18 1757      Vestibular Assessment   General Observation  Reports symptoms starting about 2 days prior and described his head spinning around when he moves around.  Reports 1 fall and initially son reports he fell on his shoulder, later reported he hit his head.       Symptom Behavior   Type of Dizziness  Spinning and imbalance    Frequency of Dizziness  intermittent    Duration of Dizziness  minutes    Aggravating Factors  Activity in general;Sit to stand    Relieving Factors  Lying supine;Closing eyes      Occulomotor Exam   Occulomotor Alignment  Normal    Spontaneous  Absent    Gaze-induced  Left beating nystagmus with L gaze    Smooth Pursuits  Intact    Saccades  Intact      Vestibulo-Occular Reflex   VOR 1 Head Only (x 1 viewing)  performed about 15 seconds with horizontal head movements with near target prior to stopping due to increased symptoms; able to perform with vertical head movements without much increased symptoms    VOR to Slow Head Movement  Positive right    VOR Cancellation  Normal      Auditory   Comments  reports long history of hearing loss, no differences L to R      Positional Testing   Sidelying Test  Sidelying Right;Sidelying Left      Sidelying Right   Sidelying Right Duration  30 s    Sidelying Right Symptoms  No nystagmus      Sidelying Left   Sidelying Left Duration  30s    Sidelying Left Symptoms  No nystagmus         Exercises     Assessment/Plan    PT Assessment Patient needs continued PT services  PT Problem List Decreased strength;Decreased mobility;Decreased safety awareness;Decreased balance;Decreased knowledge of use of  DME;Pain;Decreased activity tolerance;Decreased knowledge of precautions       PT Treatment Interventions DME instruction;Therapeutic activities;Gait training;Therapeutic exercise;Patient/family education;Balance training;Functional mobility training;Stair training    PT Goals (Current goals can be found in the Care Plan section)  Acute Rehab PT Goals Patient Stated Goal: None stated PT Goal Formulation: Patient unable to participate in goal setting Time For Goal Achievement: 01/09/18 Potential to Achieve Goals: Good    Frequency Min 3X/week   Barriers to discharge        Co-evaluation               AM-PAC PT "6 Clicks" Daily Activity  Outcome Measure Difficulty turning over in bed (including adjusting bedclothes, sheets and blankets)?: A Lot Difficulty moving from lying on back to sitting on the side of  the bed? : Unable Difficulty sitting down on and standing up from a chair with arms (e.g., wheelchair, bedside commode, etc,.)?: Unable Help needed moving to and from a bed to chair (including a wheelchair)?: A Little Help needed walking in hospital room?: A Lot Help needed climbing 3-5 steps with a railing? : A Lot 6 Click Score: 11    End of Session Equipment Utilized During Treatment: Gait belt Activity Tolerance: Treatment limited secondary to medical complications (Comment)(symptoms exacerbated by anxiety) Patient left: with call bell/phone within reach;in chair;with family/visitor present Nurse Communication: Mobility status PT Visit Diagnosis: Other abnormalities of gait and mobility (R26.89);Dizziness and giddiness (R42);History of falling (Z91.81)    Time: 1610-9604 PT Time Calculation (min) (ACUTE ONLY): 59 min   Charges:   PT Evaluation $PT Eval High Complexity: 1 High PT Treatments $Gait Training: 8-22 mins $Neuromuscular Re-education: 8-22 mins $Self Care/Home Management: 02/13/23   PT G CodesSheran Lawless,  [phone] 01/02/2018   Elray Mcgregor 01/02/2018, 6:20 PM

## 2018-01-02 NOTE — Progress Notes (Signed)
Family Medicine Teaching Service Daily Progress Note Intern Pager: [phone]  Patient name: Aaron Reyes Medical record number: [id number] Date of birth: [birth year] Age: 42 y.o. Gender: male  Primary Care Provider: Patient, No Pcp Per Consultants: none Code Status: full  Pt Overview and Major Events to Date:  7/17 admission for chest pain  Assessment and Plan: Multiple visits for chest pain for noncardiac etiology, GAD.  Chest pain and dyspnea DDX includes number was here in the hospital anxiety with panic attacks versus coronary etiology. pain appears pleuritic in nature and very atypical of cardiac etiology. Trop poc - x 3, no ischemic findings on ekg. Pt has been in ED for well over 12 hours. No further cardiac w/u plan. CT angio negative PE. No evidence of pna. -Difficult to discharge from ED as pt states he becomes too sob and dizzy to walk -possibly anxiety related -will attempt to contact case manager for to improved medication compliance and future access to care -TT ECHO  Dizziness Patient initially presented to the ED following chest pain with a fall.  Foley work-up in ED patient appeared to again have trouble walking.  Physical exam today (7/18) patient was able to take several steps that was apparently dizzy. 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  There is concern during his transportation the patient may have priapism.  Attempts were made to examine the patient at the time but patient resisted.  On exam today (7/18) patient was flaccid and had no complaints of penile pain. - DC trazadone  AKI - resolved BL 0.7-0.8, now 1.2 lightly hydrated with  Now 0.7  Tobacco abuse -nicotine patch -pt counseled through translator  FEN/GI: general diet PPx: none  Disposition: DC home today pending PT assessment and recs  Subjective:  Spoke with patient extensively today through a Forensic scientist.  Aaron Reyes continues to report diffuse chest pain in addition to right sided back pain that radiates down his leg at times in addition to lower abdominal pain on both the left and right side.  Throughout our conversation he reiterated that he was a very concerned about his recent episode of chest pain, which is still ongoing, and his lightheadedness in addition to transient heaviness of his tongue.  Each each of these issues were addressed individually, explaining to Aaron Reyes that we have conducted tests and imaging which have ruled out the more acute and deadly possible pathologies for these findings.  Following these explanations Mr. Clardy continued to express his concern for his pain and current situation.  He was informed that the most likely diagnosis at this point is due to excessive stress with poor coping.  An examination of Aaron Reyes genitals revealed a flaccid penis without pain in addition to normal testicles without pain.  This was done due to previous concern for a priapism.  During the interview I asked Aaron Reyes to stand up and to walk across his room.  He was able to stand on his own in addition to taking small steps on his own.  He did not travel far before needing assistance by leaning on the bed or on an arm and indicated that he was dizzy and that the room was spinning.  Objective: Temp:  [97.8 F (36.6 C)-98.2 F (36.8 C)] 97.8 F (36.6 C) (07/18 0527) Pulse Rate:  [62-84] 73 (07/18 0527) Resp:  [0-31] 16 (07/18 0527) BP: (119-148)/(83-99) 136/83 (07/18 0527) SpO2:  [  92 %-99 %] 99 % (07/18 0527) Physical Exam: General: Alert and cooperative and appears to be in no acute distress.  Most of the interview was conducted with the patient lying in bed.  Although we were able to have the patient stand and walk briefly. HEENT: Neck non-tender without lymphadenopathy, masses or thyromegaly Cardio: Normal A1 and S2, no S3 or S4. Rhythm and rate are regular. No murmurs or  rubs. Pulm: Clear to auscultation bilaterally, no crackles, wheezing, or diminished breath sounds. Normal respiratory effort Abdomen: Bowel sounds normal. Abdomen soft and non-tender.  Male GU: normal flacid penis, 2 normal non-tender testicles Extremities: No peripheral edema. Warm/ well perfused.  Strong radial and pedal pulses. Neuro: Patient was able to stand on his own, though unsteady.  He was also able to take several small steps on his own before needing assistance.  He appeared to be dizzy at this time no additional neurological exam was done at this time.    Laboratory: Recent Labs  Lab 01/01/18 0151  WBC 9.0  HGB 14.6  HCT 42.8  PLT 231   Recent Labs  Lab 01/01/18 0151  NA 146*  K 4.7  CL 114*  CO2 24  BUN 14  CREATININE 1.20  CALCIUM 9.1  GLUCOSE 109*    No results found for this or any previous visit.   Imaging/Diagnostic Tests: Ct Head Wo Contrast  Result Date: 01/01/2018 CLINICAL DATA:  Syncope EXAM: CT HEAD WITHOUT CONTRAST TECHNIQUE: Contiguous axial images were obtained from the base of the skull through the vertex without intravenous contrast. COMPARISON:  None. FINDINGS: Brain: The ventricles are normal in size and configuration. There is no intracranial mass, hemorrhage, extra-axial fluid collection, or midline shift. Gray-white compartments appear normal. No evident acute infarct. There is calcification in the pineal gland. Vascular: No hyperdense vessel. No vascular calcifications are evident. Skull: The bony calvarium appears intact. Sinuses/Orbits: There is mucosal thickening in several ethmoid air cells. Other visualized paranasal sinuses are clear. Orbits appear symmetric bilaterally. Other: Mastoid air cells are clear. IMPRESSION: Mucosal thickening in several ethmoid air cells. Study otherwise unremarkable. Electronically Signed   By: Bretta BangWilliam  Woodruff III M.D.   On: 01/01/2018 11:32   Dg Chest Port 1 View  Result Date: 01/01/2018 CLINICAL DATA:   42 y/o  M; centralized chest pain after syncope. EXAM: PORTABLE CHEST 1 VIEW COMPARISON:  None. FINDINGS: Normal cardiac silhouette. Increased pulmonary markings and patchy opacity in the lung bases. No pleural effusion or pneumothorax. Mild dextrocurvature of thoracic spine. No acute osseous abnormality is evident. IMPRESSION: Increased pulmonary markings and patchy opacities in the lung bases, probably atelectasis or mild edema. Electronically Signed   By: Mitzi HansenLance  Furusawa-Stratton M.D.   On: 01/01/2018 02:23   Ct Angio Chest/abd/pel For Dissection W And/or Wo Contrast  Result Date: 01/01/2018 CLINICAL DATA:  42 year old male with chest and back pain. Concern for aortic dissection. EXAM: CT ANGIOGRAPHY CHEST, ABDOMEN AND PELVIS TECHNIQUE: Multidetector CT imaging through the chest, abdomen and pelvis was performed using the standard protocol during bolus administration of intravenous contrast. Multiplanar reconstructed images and MIPs were obtained and reviewed to evaluate the vascular anatomy. CONTRAST:  100mL ISOVUE-370 IOPAMIDOL (ISOVUE-370) INJECTION 76% COMPARISON:  Chest radiograph dated 01/01/2018 FINDINGS: CTA CHEST FINDINGS Cardiovascular: There is no cardiomegaly or pericardial effusion. The thoracic aorta is unremarkable. The origins of the great vessels of the aortic arch are patent. There is no CT evidence of pulmonary embolism. Mediastinum/Nodes: Top-normal bilateral hilar lymph nodes. Subcarinal lymph  node measures approximately 14 mm in short axis. The esophagus and the thyroid gland are grossly unremarkable. No mediastinal fluid collection. Lungs/Pleura: Bilateral lower lobe predominant streaky densities and peribronchial vascular thickening may represent bronchitis or viral infection. Clinical correlation is recommended. There is no pleural effusion or pneumothorax. The central airways are patent. Musculoskeletal: No chest wall abnormality. No acute or significant osseous findings. Review of  the MIP images confirms the above findings. CTA ABDOMEN AND PELVIS FINDINGS VASCULAR Aorta: Normal caliber aorta without aneurysm, dissection, vasculitis or significant stenosis. Celiac: Patent without evidence of aneurysm, dissection, vasculitis or significant stenosis. SMA: Patent without evidence of aneurysm, dissection, vasculitis or significant stenosis. Renals: Both renal arteries are patent without evidence of aneurysm, dissection, vasculitis, fibromuscular dysplasia or significant stenosis. Accessory renal arteries noted inferior to the main renal arteries bilaterally. IMA: Patent without evidence of aneurysm, dissection, vasculitis or significant stenosis. Inflow: Patent without evidence of aneurysm, dissection, vasculitis or significant stenosis. Veins: No obvious venous abnormality within the limitations of this arterial phase study. Review of the MIP images confirms the above findings. NON-VASCULAR No intra-abdominal free air or free fluid. Hepatobiliary: The liver is unremarkable. No intrahepatic biliary ductal dilatation. The gallbladder is predominantly contracted but otherwise unremarkable. Pancreas: Unremarkable. No pancreatic ductal dilatation or surrounding inflammatory changes. Spleen: Normal in size without focal abnormality. Adrenals/Urinary Tract: A 1 cm left renal upper pole as well as left renal interpolar hypodense lesions are not well characterized, likely cysts. These can be better characterized by ultrasound. There is no hydronephrosis on either side. The visualized ureters and urinary bladder appear unremarkable. Stomach/Bowel: Stomach is within normal limits. Appendix appears normal. No evidence of bowel wall thickening, distention, or inflammatory changes. Lymphatic: No adenopathy. Reproductive: The prostate and seminal vesicles are grossly unremarkable. Other: None Musculoskeletal: No acute or significant osseous findings. Review of the MIP images confirms the above findings.  IMPRESSION: 1. Bilateral streaky and peribronchial densities may represent bronchitis or viral infection. Clinical correlation is recommended. 2. No aortic aneurysm or dissection. No CT evidence of pulmonary embolism. 3. No acute intra-abdominal or pelvic pathology. Indeterminate left renal hypodense lesions, possibly cysts. Ultrasound may provide better characterization on a nonemergent basis. Electronically Signed   By: Elgie Collard M.D.   On: 01/01/2018 05:29     Mirian Mo, MD 01/02/2018, 6:24 AM PGY-1,  Family Medicine FPTS Intern pager: [phone], text pages welcome

## 2018-01-02 NOTE — Progress Notes (Signed)
  Echocardiogram 2D Echocardiogram has been performed.  Carrye Goller L Androw 01/02/2018, 11:39 AM

## 2018-01-03 ENCOUNTER — Encounter (HOSPITAL_COMMUNITY): Payer: Self-pay

## 2018-01-03 ENCOUNTER — Other Ambulatory Visit: Payer: Self-pay

## 2018-01-03 DIAGNOSIS — F419 Anxiety disorder, unspecified: Secondary | ICD-10-CM

## 2018-01-03 LAB — LIPID PANEL
CHOL/HDL RATIO: 6.1 ratio
CHOLESTEROL: 183 mg/dL (ref 0–200)
HDL: 30 mg/dL — ABNORMAL LOW (ref >40)
LDL CALC: 113 mg/dL — AB (ref 0–99)
Triglycerides: 198 mg/dL — ABNORMAL HIGH (ref <150)
VLDL: 40 mg/dL (ref 0–40)

## 2018-01-03 LAB — HEMOGLOBIN A1C
Hgb A1c MFr Bld: 6.8 % — ABNORMAL HIGH (ref 4.8–5.6)
MEAN PLASMA GLUCOSE: 148.46 mg/dL

## 2018-01-03 LAB — TSH: TSH: 3.099 u[IU]/mL (ref 0.350–4.500)

## 2018-01-03 MED ORDER — BACLOFEN 10 MG PO TABS: 10.0000 mg | ORAL_TABLET | Freq: Every day | ORAL | 0 refills | Status: DC

## 2018-01-03 MED ORDER — MECLIZINE HCL 25 MG PO TABS: 25.0000 mg | ORAL_TABLET | Freq: Two times a day (BID) | ORAL | 0 refills | Status: DC | PRN

## 2018-01-03 MED ORDER — ROSUVASTATIN CALCIUM 20 MG PO TABS: 20.0000 mg | ORAL_TABLET | Freq: Every day | ORAL | 0 refills | Status: DC

## 2018-01-03 NOTE — Progress Notes (Signed)
Family Medicine Teaching Service Daily Progress Note Intern Pager: [phone]  Patient name: Aaron Reyes Medical record number: [id number] Date of birth: 1976/03/10 Age: 42 y.o. Gender: male  Primary Care Provider: Patient, No Pcp Per Consultants: none Code Status: full  Pt Overview and Major Events to Date:  7/17 admission for chest pain  Assessment and Plan: Multiple visits for chest pain for noncardiac etiology, GAD.  Chest pain and dyspnea 2/2 anxiety DDX includes number was here in the hospital anxiety with panic attacks versus coronary etiology. pain appears pleuritic in nature and very atypical of cardiac etiology. Trop poc - x 3, no ischemic findings on ekg. Pt has been in ED for well over 12 hours. No further cardiac w/u plan. CT angio negative PE. No evidence of pna. -Difficult to discharge from ED as pt states he becomes too sob and dizzy to walk -possibly anxiety related -will attempt to contact case manager for to improved medication compliance and future access to care -TT ECHO - totally normal - will send home on Lexapro and Buspar   Dizziness Patient initially presented to the ED following chest pain with a fall.  Foley work-up in ED patient appeared to again have trouble walking.  Physical exam today (7/18) patient was able to take several steps that was apparently dizzy. -CT head negative for acute changes -prn meclizine, supportive care -PT consult - Rolling walker, 24 hr supervision, home health vestibular PT  Hyperlipidemia Lipid panel done in the hospital was remarkable for total cholesterol 183 LDL 113 and HDL 30.  ASCVD risk 13% -We will begin high intensity statin  Type 2 diabetes While hospital he was found to have an A1c of 6.8. - We will attempt to control with lifestyle modification for now - Ambulatory nutrition consult  Priapism On trazadone at home.  There is concern during his transportation the patient may have priapism.  Attempts were made  to examine the patient at the time but patient resisted.  On exam today (7/18) patient was flaccid and had no complaints of penile pain. - DC trazadone  AKI - resolved BL 0.7-0.8, now 1.2 lightly hydrated with  Now 0.7  Tobacco abuse -nicotine patch -pt counseled through translator  FEN/GI: general diet PPx: none  Disposition: DC home today  Subjective:   Objective: Temp:  [97.7 F (36.5 C)-98.2 F (36.8 C)] 98.2 F (36.8 C) (07/19 0555) Pulse Rate:  [63-80] 63 (07/19 0555) Resp:  [16-18] 18 (07/19 0555) BP: (124-139)/(71-98) 139/71 (07/19 0555) SpO2:  [98 %-99 %] 99 % (07/19 0555) Physical Exam:     Laboratory: Recent Labs  Lab 01/01/18 0151  WBC 9.0  HGB 14.6  HCT 42.8  PLT 231   Recent Labs  Lab 01/01/18 0151 01/02/18 0825  NA 146* 140  K 4.7 3.7  CL 114* 109  CO2 24 24  BUN 14 11  CREATININE 1.20 0.74  CALCIUM 9.1 8.4*  GLUCOSE 109* 137*    No results found for this or any previous visit.   Imaging/Diagnostic Tests: Ct Head Wo Contrast  Result Date: 01/01/2018 CLINICAL DATA:  Syncope EXAM: CT HEAD WITHOUT CONTRAST TECHNIQUE: Contiguous axial images were obtained from the base of the skull through the vertex without intravenous contrast. COMPARISON:  None. FINDINGS: Brain: The ventricles are normal in size and configuration. There is no intracranial mass, hemorrhage, extra-axial fluid collection, or midline shift. Gray-white compartments appear normal. No evident acute infarct. There is calcification in the pineal gland. Vascular: No hyperdense  vessel. No vascular calcifications are evident. Skull: The bony calvarium appears intact. Sinuses/Orbits: There is mucosal thickening in several ethmoid air cells. Other visualized paranasal sinuses are clear. Orbits appear symmetric bilaterally. Other: Mastoid air cells are clear. IMPRESSION: Mucosal thickening in several ethmoid air cells. Study otherwise unremarkable. Electronically Signed   By: Bretta Bang III M.D.   On: 01/01/2018 11:32   Dg Chest Port 1 View  Result Date: 01/01/2018 CLINICAL DATA:  42 y/o  M; centralized chest pain after syncope. EXAM: PORTABLE CHEST 1 VIEW COMPARISON:  None. FINDINGS: Normal cardiac silhouette. Increased pulmonary markings and patchy opacity in the lung bases. No pleural effusion or pneumothorax. Mild dextrocurvature of thoracic spine. No acute osseous abnormality is evident. IMPRESSION: Increased pulmonary markings and patchy opacities in the lung bases, probably atelectasis or mild edema. Electronically Signed   By: Mitzi Hansen M.D.   On: 01/01/2018 02:23   Ct Angio Chest/abd/pel For Dissection W And/or Wo Contrast  Result Date: 01/01/2018 CLINICAL DATA:  42 year old male with chest and back pain. Concern for aortic dissection. EXAM: CT ANGIOGRAPHY CHEST, ABDOMEN AND PELVIS TECHNIQUE: Multidetector CT imaging through the chest, abdomen and pelvis was performed using the standard protocol during bolus administration of intravenous contrast. Multiplanar reconstructed images and MIPs were obtained and reviewed to evaluate the vascular anatomy. CONTRAST:  ISOVUE-370 IOPAMIDOL (ISOVUE-370) INJECTION 76% COMPARISON:  Chest radiograph dated 01/01/2018 FINDINGS: CTA CHEST FINDINGS Cardiovascular: There is no cardiomegaly or pericardial effusion. The thoracic aorta is unremarkable. The origins of the great vessels of the aortic arch are patent. There is no CT evidence of pulmonary embolism. Mediastinum/Nodes: Top-normal bilateral hilar lymph nodes. Subcarinal lymph node measures approximately 14 mm in short axis. The esophagus and the thyroid gland are grossly unremarkable. No mediastinal fluid collection. Lungs/Pleura: Bilateral lower lobe predominant streaky densities and peribronchial vascular thickening may represent bronchitis or viral infection. Clinical correlation is recommended. There is no pleural effusion or pneumothorax. The central airways  are patent. Musculoskeletal: No chest wall abnormality. No acute or significant osseous findings. Review of the MIP images confirms the above findings. CTA ABDOMEN AND PELVIS FINDINGS VASCULAR Aorta: Normal caliber aorta without aneurysm, dissection, vasculitis or significant stenosis. Celiac: Patent without evidence of aneurysm, dissection, vasculitis or significant stenosis. SMA: Patent without evidence of aneurysm, dissection, vasculitis or significant stenosis. Renals: Both renal arteries are patent without evidence of aneurysm, dissection, vasculitis, fibromuscular dysplasia or significant stenosis. Accessory renal arteries noted inferior to the main renal arteries bilaterally. IMA: Patent without evidence of aneurysm, dissection, vasculitis or significant stenosis. Inflow: Patent without evidence of aneurysm, dissection, vasculitis or significant stenosis. Veins: No obvious venous abnormality within the limitations of this arterial phase study. Review of the MIP images confirms the above findings. NON-VASCULAR No intra-abdominal free air or free fluid. Hepatobiliary: The liver is unremarkable. No intrahepatic biliary ductal dilatation. The gallbladder is predominantly contracted but otherwise unremarkable. Pancreas: Unremarkable. No pancreatic ductal dilatation or surrounding inflammatory changes. Spleen: Normal in size without focal abnormality. Adrenals/Urinary Tract: A 1 cm left renal upper pole as well as left renal interpolar hypodense lesions are not well characterized, likely cysts. These can be better characterized by ultrasound. There is no hydronephrosis on either side. The visualized ureters and urinary bladder appear unremarkable. Stomach/Bowel: Stomach is within normal limits. Appendix appears normal. No evidence of bowel wall thickening, distention, or inflammatory changes. Lymphatic: No adenopathy. Reproductive: The prostate and seminal vesicles are grossly unremarkable. Other: None  Musculoskeletal: No acute or significant  osseous findings. Review of the MIP images confirms the above findings. IMPRESSION: 1. Bilateral streaky and peribronchial densities may represent bronchitis or viral infection. Clinical correlation is recommended. 2. No aortic aneurysm or dissection. No CT evidence of pulmonary embolism. 3. No acute intra-abdominal or pelvic pathology. Indeterminate left renal hypodense lesions, possibly cysts. Ultrasound may provide better characterization on a nonemergent basis. Electronically Signed   By: Elgie Collard M.D.   On: 01/01/2018 05:29     Mirian Mo, MD 01/03/2018, 6:05 AM PGY-1, [facility] Family Medicine FPTS Intern pager: [phone], text pages welcome

## 2018-01-03 NOTE — Progress Notes (Signed)
Aaron PippinsIbrahim A Reyes to be D/C'd Home per MD order.  Discussed prescriptions and follow up appointments with the patient. Prescriptions given to patient, medication list explained in detail. Pt verbalized understanding.  Allergies as of 01/03/2018      Reactions   Pork-derived Products    per religious preference      Medication List    STOP taking these medications   aspirin-acetaminophen-caffeine 250-250-65 MG tablet Commonly known as:  EXCEDRIN MIGRAINE   cyclobenzaprine 10 MG tablet Commonly known as:  FLEXERIL   nitroGLYCERIN 0.4 MG SL tablet Commonly known as:  NITROSTAT   traZODone 100 MG tablet Commonly known as:  DESYREL     TAKE these medications   amitriptyline 50 MG tablet Commonly known as:  ELAVIL Take 25 mg by mouth at bedtime.   aspirin EC 81 MG tablet Take 81 mg by mouth daily.   baclofen 10 MG tablet Commonly known as:  LIORESAL Take 1 tablet (10 mg total) by mouth daily.   busPIRone 15 MG tablet Commonly known as:  BUSPAR Take 15 mg by mouth 3 (three) times daily.   escitalopram 20 MG tablet Commonly known as:  LEXAPRO Take 20 mg by mouth at bedtime.   meclizine 25 MG tablet Commonly known as:  ANTIVERT Take 1 tablet (25 mg total) by mouth 2 (two) times daily as needed for dizziness.   rosuvastatin 20 MG tablet Commonly known as:  CRESTOR Take 1 tablet (20 mg total) by mouth daily.            Durable Medical Equipment  (From admission, onward)        Start     Ordered   01/03/18 0944  For home use only DME Walker  Once    Question:  Patient needs a walker to treat with the following condition  Answer:  Dizziness   01/03/18 0944      Vitals:   01/03/18 1009 01/03/18 1037  BP: 139/71 (!) 132/95  Pulse: 63 62  Resp: 18 18  Temp: 98.2 F (36.8 C) 97.8 F (36.6 C)  SpO2:  99%    Skin clean, dry and intact without evidence of skin break down, no evidence of skin tears noted. IV catheter discontinued intact. Site without signs and  symptoms of complications. Dressing and pressure applied. Pt denies pain at this time. No complaints noted.  An After Visit Summary was printed and given to the patient. Translator used with patient and son.  Patient escorted via WC, and D/C home via private auto.   Aaron BologneseAnisha Mabe RN, BSN

## 2018-01-03 NOTE — Progress Notes (Signed)
Inpatient Rehabilitation Admissions Coordinator  Rehab Admissions Coordinator Note:  Patient was screened by Clois DupesBoyette, Wilfredo Canterbury Godwin for appropriateness for an Inpatient Acute Rehab Consult per PT change in recommendation.  At this time, we are recommending HH and 24/7 assist of family. Patient unlikely to meet the Medical neccesity for an inpt hospital rehab admit with current diagnosis. Please call me with any questions.  Clois DupesBoyette, Nakari Bracknell Godwin 01/03/2018, 12:45 PM  I can be reached at [phone].

## 2018-01-03 NOTE — Care Management Note (Signed)
Case Management Note  Patient Details  Name: Aaron Reyes MRN: [id number] Date of Birth: 01-Apr-1976  Subjective/Objective:    From home with family, for dc today, son in room , [facility], [facility] and to Berkley[facility] for rolling walker.                 [facility]Plan[facility]  Expected Discharge Date:  01/03/18               Expected Discharge Plan:  Home w Home Health Services  In-House Referral:     Discharge planning Services  CM Consult  Post Acute Care Choice:  Home Health Choice offered to:  Patient  DME Arranged:  Walker rolling DME Agency:  [facility]  [facility]  [facility]  Status of Service:  Completed, signed off  If discussed at Long Length of Stay Meetings, dates discussed:    Additional Comments:  Leone Havenaylor, Jhonatan Lomeli Clinton, RN 01/03/2018, 2:33 PM

## 2018-01-03 NOTE — Discharge Instructions (Signed)
°  Please take SSRI and buspar for anxiety Please start taking statin for cholesterol Please start changing lifestyle for DM- follow up w/ PCP in 3 months

## 2018-01-03 NOTE — Progress Notes (Addendum)
Physical Therapy Treatment Patient Details Name: Aaron Reyes MRN: [id number] DOB: 1975/07/17 Today's Date: 01/03/2018    History of Present Illness Patient is a 42 y/o [place] male admitted due to dizziness, chest pain, fall at home and difficulty discharging due to inability to walk.     PT Comments    Pt admitted with above diagnosis. Pt currently with functional limitations due to vertigo, balance and endurance deficits. Pt was able to perform x1 exercises with cues and dizziness worse when pt was moving head  up and down.  Pt can recover between exercises.  Pt attempted to ambulate with RW and min assist initially but progressed to mod assist as pt lost balance when he was not focusing on target and needing mod assist and chair brought to pt.  Pt unsafe even with RW at this time due to profound vertigo and pt has difficulty following instructions for compensation.  Also even though video interpreter used, it still limits pt a litltle due to translation time.  Pt with supportive family. Will have 24 hour care.  Feel that Rehab may benefit pt so he can learn strategies and improve mobility.  Hopeful that pt will be considered for Rehab. MD:  Pt reports tinnitus as well as hearing loss.  Paged intern regarding these symptoms.  Pt will not get therapy at home most likely due to Insurance.   Pt will benefit from skilled PT to increase their independence and safety with mobility to allow discharge to the venue listed below.     Follow Up Recommendations  CIR;Supervision/Assistance - 24 hour     Equipment Recommendations  Rolling walker with 5" wheels;Wheelchair (measurements PT);Wheelchair cushion (measurements PT);3in1 (PT)    Recommendations for Other Services       Precautions / Restrictions Precautions Precautions: Fall Restrictions Weight Bearing Restrictions: No    Mobility  Bed Mobility Overal bed mobility: Needs Assistance Bed Mobility: Supine to Sit     Supine to sit: Min  guard     General bed mobility comments: Pt able to come to EOB on his own but rather impulsively needing cues for safety.    Transfers Overall transfer level: Needs assistance Equipment used: None Transfers: Sit to/from Stand Sit to Stand: Supervision;Min guard         General transfer comment: Pt stood with cues for hand placement.  Steady upon standing  Ambulation/Gait Ambulation/Gait assistance: Min assist;Mod assist;+2 safety/equipment Gait Distance (Feet): 15 Feet Assistive device: Rolling walker (2 wheeled) Gait Pattern/deviations: Step-through pattern;Step-to pattern;Decreased stride length   Gait velocity interpretation: <1.31 ft/sec, indicative of household ambulator General Gait Details: cues for positioning in walker and to keep eyes focused on object for visual compensation.  After only 15 feet, pt quit looking at target and needed mod assist to recover balance with pt closing eyes as well.  Had to pull chair up behind pt.     Stairs             Wheelchair Mobility    Modified Rankin (Stroke Patients Only)       Balance Overall balance assessment: Needs assistance Sitting-balance support: No upper extremity supported;Feet supported Sitting balance-Leahy Scale: Good     Standing balance support: Bilateral upper extremity supported;During functional activity Standing balance-Leahy Scale: Poor Standing balance comment: unable to stand without assist                            Cognition Arousal/Alertness: Awake/alert  Behavior During Therapy: Anxious Overall Cognitive Status: Difficult to assess                                        Exercises Other Exercises Other Exercises: x1 exercises with handouts given     General Comments General comments (skin integrity, edema, etc.): Used video interpreter Aaron Reyes.  Upon sitting reviewed x1 exercises with pt performing with cues side to side and up and down.  Dizzy 5/10  initially and 7/10 with exercises.  Resolution to 5/10 each attempt.  Extensive education with pt and son to perform exercises multiple times throughout today and weekend and they agree.  Pt did not have difficulty with exercises.  Then when pt walked, he did have episode where he was breathing hard and needed to sit.  Came back to bed and assessed for right BPPV and treated as he did have symptoms.  Pt still with hypofunction as well and needs to continue to perform exercises.  Assisted pt and son with how to order meals on phone.  Son was able to order food that pt wanted.        Pertinent Vitals/Pain Pain Assessment: Faces Faces Pain Scale: Hurts little more Pain Location: holding his chest at times Pain Descriptors / Indicators: Discomfort;Grimacing Pain Intervention(s): Limited activity within patient's tolerance;Monitored during session;Repositioned    Home Living                      Prior Function            PT Goals (current goals can now be found in the care plan section) Acute Rehab PT Goals Patient Stated Goal: None stated Progress towards PT goals: Progressing toward goals    Frequency    Min 3X/week      PT Plan Discharge plan needs to be updated    Co-evaluation              AM-PAC PT "6 Clicks" Daily Activity  Outcome Measure  Difficulty turning over in bed (including adjusting bedclothes, sheets and blankets)?: A Lot Difficulty moving from lying on back to sitting on the side of the bed? : Unable Difficulty sitting down on and standing up from a chair with arms (e.g., wheelchair, bedside commode, etc,.)?: A Lot Help needed moving to and from a bed to chair (including a wheelchair)?: A Little Help needed walking in hospital room?: A Lot Help needed climbing 3-5 steps with a railing? : A Lot 6 Click Score: 12    End of Session Equipment Utilized During Treatment: Gait belt Activity Tolerance: Treatment limited secondary to medical  complications (Comment)(symptoms exacerbated by anxiety) Patient left: with call bell/phone within reach;with family/visitor present;in bed;with bed alarm set Nurse Communication: Mobility status PT Visit Diagnosis: Other abnormalities of gait and mobility (R26.89);Dizziness and giddiness (R42);History of falling (Z91.81)     Time: 1610-9604 PT Time Calculation (min) (ACUTE ONLY): 64 min  Charges:  $Gait Training: 8-22 mins $Therapeutic Exercise: 8-22 mins $Therapeutic Activity: 8-22 mins $Self Care/Home Management: 8-22                    G Codes:       Jisell Majer,PT Acute Rehabilitation [phone] (pager)    Berline Lopes 01/03/2018, 10:57 AM

## 2018-01-03 NOTE — Progress Notes (Signed)
Patient complaining of heartburn and anxiety. MD notified. MD stated that they are unable to give anything for patient at this time. Will continue to monitor.

## 2018-01-05 NOTE — Discharge Summary (Signed)
Family Medicine Teaching [facility] Discharge Summary  Patient name: Aaron Reyes Medical record number: [id number] Date of birth: May 26, 1976 Age: 42 y.o. Gender: male Date of Admission: 01/01/2018  Date of Discharge: 01/03/2018 Admitting Physician: Provider Default, MD  Primary Care Provider: Patient, No Pcp Per Consultants: none  Indication for Hospitalization: chest pain  Discharge Diagnoses/Problem List:  Patient Active Problem List   Diagnosis Date Noted  . Anxiety   . Chest pain 01/01/2018  . Dyspnea 01/01/2018  . Tobacco abuse 01/01/2018  . Dizziness 01/01/2018    Disposition: DC home with home PT  Discharge Condition: Stable  Discharge Exam:  Physical Exam  Constitutional: He appears well-developed and well-nourished. No distress.  Anxious appearing 42 year old man who would frequently repeat questions regarding the cause of his chest pain despite receiving answers.  Eyes: Pupils are equal, round, and reactive to light. EOM are normal.  Neck: No thyromegaly present.  Cardiovascular: Normal rate and regular rhythm. Exam reveals no gallop and no friction rub.  No murmur heard. Pulmonary/Chest: Effort normal and breath sounds normal. No stridor. No respiratory distress. He has no wheezes. He has no rales. He exhibits no tenderness.  Abdominal: Soft. Bowel sounds are normal. He exhibits no distension. There is no tenderness. There is no guarding.  Genitourinary: Penis normal. No penile tenderness.  Lymphadenopathy:    He has no cervical adenopathy.  Neurological:  Cranial nerves grossly intact.  Patient did have difficulty walking independently.  He was able to take several small steps before requiring assistance.  Skin: He is not diaphoretic.  Psychiatric:  Anxious   Brief Hospital Course:  [name] is a 42 year old [place] refugee the medical history significant for headaches and multiple work-ups for MI all of which have been unremarkable.  He presented to  the emergency room 7/17 with complaints of crushing chest pain.  Chest x-ray, EKG, TTE, troponin labs were all unremarkable.  There is no concern for cardiac etiology of his chest pain.  Most likely diagnosis for his chest pain is unmanaged anxiety.  His home BuSpar and Lexapro were continued however his trazodone was discontinued out of concern for priapism.  During his time here he was found to have trouble balancing while walking.  CT head showed no acute changes in physical therapy noted that his presentation was suspicious for labyrinthitis or other vestibular etiology.  He was sent home on 7/19 with a walker and home vestibular physical therapy.  Issues for Follow Up:  1. Follow-up regarding compliance of medications.  Specifically, Lexapro and BuSpar.  Appropriate medication for anxiety will likely reduce future emergency room visits for chest pain. 2. Please work with social work to help patient with transportation to and from medical appointments and assistance with medication.  Significant Procedures: none  Significant Labs and Imaging:  Recent Labs  Lab 01/01/18 0151  WBC 9.0  HGB 14.6  HCT 42.8  PLT 231   Recent Labs  Lab 01/01/18 0151 01/02/18 0825  NA 146* 140  K 4.7 3.7  CL 114* 109  CO2 24 24  GLUCOSE 109* 137*  BUN 14 11  CREATININE 1.20 0.74  CALCIUM 9.1 8.4*      Results/Tests Pending at Time of Discharge: none  Discharge Medications:  Allergies as of 01/03/2018      Reactions   Pork-derived Products    per religious preference      Medication List    STOP taking these medications   aspirin-acetaminophen-caffeine 250-250-65 MG tablet Commonly known  as:  EXCEDRIN MIGRAINE   cyclobenzaprine 10 MG tablet Commonly known as:  FLEXERIL   nitroGLYCERIN 0.4 MG SL tablet Commonly known as:  NITROSTAT   traZODone 100 MG tablet Commonly known as:  DESYREL     TAKE these medications   amitriptyline 50 MG tablet Commonly known as:  ELAVIL Take 25 mg  by mouth at bedtime.   aspirin EC 81 MG tablet Take 81 mg by mouth daily.   baclofen 10 MG tablet Commonly known as:  LIORESAL Take 1 tablet (10 mg total) by mouth daily.   busPIRone 15 MG tablet Commonly known as:  BUSPAR Take 15 mg by mouth 3 (three) times daily.   escitalopram 20 MG tablet Commonly known as:  LEXAPRO Take 20 mg by mouth at bedtime.   meclizine 25 MG tablet Commonly known as:  ANTIVERT Take 1 tablet (25 mg total) by mouth 2 (two) times daily as needed for dizziness.   rosuvastatin 20 MG tablet Commonly known as:  CRESTOR Take 1 tablet (20 mg total) by mouth daily.       Discharge Instructions: Please refer to Patient Instructions section of EMR for full details.  Patient was counseled important signs and symptoms that should prompt return to medical care, changes in medications, dietary instructions, activity restrictions, and follow up appointments.   Follow-Up Appointments: Follow-up Information    Tobey GrimWalden, Jeffrey H, MD. Go on 01/07/2018.   Specialty:  Family Medicine Why:  at 11:30 am  Contact information: [address] [place] [address] [phone]        [facility] - Dme Follow up.   Why:  rolling walker Contact information: [address] [place] [address] [phone]        Health, [facility]-Home Follow up.   Specialty:  Home Health Services Why:  HHPT Contact information: [address] [place] [address] [phone]           Mirian MoFrank, Tekela Garguilo, MD 01/05/2018, 2:06 PM PGY-1, [facility]

## 2018-01-06 ENCOUNTER — Encounter: Payer: Self-pay | Admitting: Student

## 2018-01-07 ENCOUNTER — Ambulatory Visit: Payer: Medicaid Other | Admitting: Family Medicine

## 2018-01-08 ENCOUNTER — Telehealth: Payer: Self-pay | Admitting: *Deleted

## 2018-01-08 NOTE — Telephone Encounter (Signed)
Jerilyn needs verbal orders for the following:  2x week for 4 weeks for S/p referral from emergency room for vertigo  Taylynn Easton, Maryjo RochesterJessica Dawn, CMA

## 2018-01-08 NOTE — Telephone Encounter (Signed)
LMOVM informing Marcie BalJerilyn of Verbal orders. Fleeger, Maryjo RochesterJessica Dawn, CMA

## 2018-01-14 ENCOUNTER — Ambulatory Visit: Payer: Medicaid Other | Admitting: Family Medicine

## 2018-01-14 ENCOUNTER — Other Ambulatory Visit: Payer: Self-pay

## 2018-01-14 ENCOUNTER — Encounter: Payer: Self-pay | Admitting: Family Medicine

## 2018-01-14 VITALS — BP 114/78 | HR 84 | Temp 98.3°F | Ht 65.0 in | Wt 149.4 lb

## 2018-01-14 DIAGNOSIS — R0789 Other chest pain: Secondary | ICD-10-CM | POA: Diagnosis present

## 2018-01-14 MED ORDER — FAMOTIDINE 20 MG PO TABS: 20.0000 mg | ORAL_TABLET | Freq: Two times a day (BID) | ORAL | 2 refills | Status: DC

## 2018-01-14 NOTE — Patient Instructions (Signed)
It was a pleasure to see you today! Thank you for choosing [facility] for your primary care. Aaron Reyes was seen for anxiety. Come back to the clinic if you have any new concerns, and go to the emergency room if you have any life threatening problems.  Your heart tests have been good.  We think most of this chest tightness is from anxiety and that to treat it you need to be more regular in seeing your counselor/psychiatrist.  If we did any lab work today that did not result today, one of two things will happen.  1. If everything is normal, you will get a letter in mail sent to the address in your chart with the results for your records.  It is important to keep your address up to date as that is where we will send results.  2. If the results require some sort of discussion, my nurses or myself will call you on the phone number listed in your records.  It is important to keep your phone number up to date in our system as this is how we will try to reach you.  If we cannot reach you on the phone, we will try to send you a letter in the mail so please enable to voicemail function of your phone.  If you don't hear from us in two weeks, please give us a call to verify your results. Otherwise, we look forward to seeing you again at your next visit. If you have any questions or concerns before then, please call the clinic at [phone].   Please bring all your medications to every doctors visit   Sign up for My Chart to have easy access to your labs results, and communication with your Primary care physician.     Please check-out at the front desk before leaving the clinic.     Best,  Dr. Marthenia RollingScott Charlies Reyes FAMILY MEDICINE RESIDENT - PGY2 01/14/2018 2:57 PM

## 2018-01-16 ENCOUNTER — Telehealth: Payer: Self-pay | Admitting: Licensed Clinical Social Worker

## 2018-01-16 NOTE — Progress Notes (Signed)
Type of Service: Clinical Social Work  Social work consult from Dr. Theron AristaPeter reference assisting patient with transportation to medical appointments.   LCSW spoke with patient to assess the need via interpreter [phone] [facility])  The following was discussed;Medicaid Transportation, phone number and how to access transportation for his appointment.  Patient appreciative of talking with LCSW.  Update provided to PCP via in basket message.   Sammuel Hineseborah Moore, LCSW Licensed Clinical Social Worker [facility]   [phone] 10:12 AM

## 2018-01-16 NOTE — Progress Notes (Signed)
    Subjective:  Aaron Reyes is a 42 y.o. male who [facility] with a chief complaint of hospital followup.   HPI: Patient with unchanging chest tightness and feeling of shortness of breath when he gets stressed out.  He has significant family stressors  He shared that he had been missing a lot of his psych appts due to stress.  The physical therapist appts he has kept since leaving the hospital but doesn't think it's helping since he has had only 2 visits so far.  He does have an upcoming psych appt and commits to going to it.  We talked at length about his cardiac workup results.  And how it has been reassuring so far.  We will spend more time on diabetes at his next visit as he wants to focus on cardiac discussion this visit.  Objective:  Physical Exam: BP 114/78   Pulse 84   Temp 98.3 F (36.8 C) (Oral)   Ht 5\' 5"  (1.651 m)   Wt 149 lb 6.4 oz (67.8 kg)   SpO2 98%   BMI 24.86 kg/m   Gen: NAD, anxious CV: RRR with no murmurs appreciated Pulm: NWOB, CTAB with no crackles, wheezes, or rhonchi GI: Normal bowel sounds present. Soft, Nontender, Nondistended. MSK: no edema, cyanosis, or clubbing noted.  Was walking with a cane in case he gets dizzy but gait was normal on walk in Skin: warm, dry Neuro: grossly normal, moves all extremities Psych: Normal affect and thought content  No results found for this or any previous visit (from the past 72 hour(s)).   Assessment/Plan:  Chest pain We talked about how his chest pain is almost certainly anxiety related and the most effective treatment as his cardiac workups for MI have all been negative.  He agrees to be more consistent with psych followup   Marthenia RollingScott Avrom Robarts, DO FAMILY MEDICINE RESIDENT - PGY2 01/16/2018 12:48 PM

## 2018-01-16 NOTE — Assessment & Plan Note (Signed)
We talked about how his chest pain is almost certainly anxiety related and the most effective treatment as his cardiac workups for MI have all been negative.  He agrees to be more consistent with psych followup

## 2018-01-20 ENCOUNTER — Other Ambulatory Visit: Payer: Self-pay | Admitting: Family Medicine

## 2018-01-20 NOTE — Progress Notes (Signed)
Orders for home health filled out and placed in fax pile  -Dr. Parke SimmersBland

## 2018-01-30 ENCOUNTER — Ambulatory Visit (INDEPENDENT_AMBULATORY_CARE_PROVIDER_SITE_OTHER): Payer: Medicaid Other | Admitting: Licensed Clinical Social Worker

## 2018-01-30 DIAGNOSIS — Z658 Other specified problems related to psychosocial circumstances: Secondary | ICD-10-CM

## 2018-01-30 NOTE — Progress Notes (Signed)
Type of Service: Integrated Behavioral Health  Estimate Time:1 hour Interpreter:Yes.   Azucena CecilNuha Mohammed with [facility].  Lennox Pippinsbrahim A Newkirk is a [age] y.o. male [facility].  [facility].  Referral from [facility] is to get patient reconnected with PCP, psychotherapy referral, psychiatric medication and assistance with housing.  Patient  was accompanied by his wife and interpreter. Reports : concerns with housing, not being able to pay rent, feeling stressed and overwhelmed, wants social security for his wife, wants new housing . Reports spends most time in his room. Does not like being around loud nose and people.  States he is currently facing eviction if he cannot pay the rent.  Indicated someone corrected money for his family and now will not give it to them.  Patient spent a lot of time talking about his current difficult situation.  Duration of problem: on and off for the past 3 years   Impact: difficulty interacting with family as well as  feeling overwhelmed and stressed.  Mental health / substance use: PTSD and  Anxiety  Appearance:Well Groomed ; Thought process: Coherent; Affect: Appropriate and Tearful : No plan to harm self or others thinks "If God takes me this will better but never think of hurting or killing self, this is again my religion".  Depression screen [facility] 2/9 01/31/2018 01/14/2018 09/02/2017  Decreased Interest 3 0 0  Down, Depressed, Hopeless 3 1 1   PHQ - 2 Score 6 1 1   Altered sleeping 3 - -  Tired, decreased energy 3 - -  Change in appetite 3 - -  Feeling bad or failure about yourself  3 - -  Trouble concentrating 3 - -  Moving slowly or fidgety/restless 2 - -  Suicidal thoughts 2 - -  PHQ-9 Score 25 - -  Difficult doing work/chores - - -  LIFE /SOCIAL :  patient lives with wife of 15 years ,and five children   Recent Life changes: stress with housing and managing health concerns  GOALS ADDRESSED:  Patient  will: 1. Reduce symptoms of: anxiety, depression and stress 2. Increase knowledge and/or ability of: stress reduction  3. Resolve housing concerns INTERVENTION: ,Emotional support,  Reflective listening, , Publishing rights managerCommunity Resource, Chief Technology Officerroblem-solving teaching/coping strategies, Supportive Counseling and Consult MD   ISSUES DISCUSSED: Integrated care services, support system, coping skills, community resources currently and previously utilized, medicaid transportation, ( states friends takes him to appointments) review of community options and scheduling appointment with PCP.    ASSESSMENT:Patient has a PTSD and anxiety diagnosis.  Symptoms exacerbated by psychosocial stressors and difficulty managing these chronic stressors. Per patient he is not currently seeing a therapist or psychiatry.  Patient has previously received services from [facility] for 8 months ( he is no longer receiving services), [employer] and [employer] [facility].  Patient may benefit from a case manager to assist with navigating the systems due to language barriers and possible lack of F/U.  Patient also wants to be referred to psychiatry.  LCSW will send message to PCP. PLAN:   1. Patient will schedule appointment with PCP  2. Patient will F/U with LCSW in 2 weeks to review what he has accomplished with information provided.  3. Referral:Community Resources:  Actuaryinances, PhotographerHousing and Transportation, Manufacturing engineeraith [facility] international [facility], [employer] for [employer] [place] for Case Office managermanagement Services, Housing Coalition and [employer] [employer] as well as Tour managercontact information for [employer].  Sammuel Hineseborah Almarosa Bohac, LCSW Licensed Clinical Social Worker [facility]   [phone] 9:26 AM

## 2018-01-31 ENCOUNTER — Encounter: Payer: Self-pay | Admitting: Licensed Clinical Social Worker

## 2018-02-03 ENCOUNTER — Encounter: Payer: Self-pay | Admitting: Psychology

## 2018-02-03 ENCOUNTER — Ambulatory Visit: Payer: Medicaid Other | Admitting: Family Medicine

## 2018-02-11 ENCOUNTER — Encounter: Payer: Self-pay | Admitting: Licensed Clinical Social Worker

## 2018-02-11 NOTE — Progress Notes (Addendum)
Type of Service: Clinical Social Work Consult  Patient was no show for medical appointment 02/03/18.  LCSW Called [facility] therapist Willene Hatchet for care coordination as he made referral for patient to come to [facility]. He was unable to provided therapy for patient due to complex social issues.  Has tried to connect patient to [employer] [phone]) to assist with bills and locating House.  Unsuccessful with getting patient connected due to language barrier.  LCSW provided patient and interpreter with same information during [facility] office visit.  Patient is scheduled to return 02/12/18 for F/U.  LCSW met with [facility] Team (Dr. Gwenlyn Saran and Jackalyn Lombard) for care coordination of community resources for this family.  Gennaro Africa is seeing patient's wife.    Family will be referred to [facility] [phone] and [employer] [phone].  Family has complex social needs, unable to meet the needs at this clinic. They would benefit from a case manager to assist them with navigating community resources. Will work to get the family connected.   Casimer Lanius, LCSW Licensed Clinical Social Worker [place]   [phone] 11:21 AM

## 2018-02-12 ENCOUNTER — Ambulatory Visit (INDEPENDENT_AMBULATORY_CARE_PROVIDER_SITE_OTHER): Payer: Medicaid Other | Admitting: Licensed Clinical Social Worker

## 2018-02-12 ENCOUNTER — Other Ambulatory Visit: Payer: Self-pay | Admitting: Family Medicine

## 2018-02-12 DIAGNOSIS — F411 Generalized anxiety disorder: Secondary | ICD-10-CM

## 2018-02-12 DIAGNOSIS — Z658 Other specified problems related to psychosocial circumstances: Secondary | ICD-10-CM

## 2018-02-12 DIAGNOSIS — F419 Anxiety disorder, unspecified: Secondary | ICD-10-CM

## 2018-02-12 DIAGNOSIS — F4310 Post-traumatic stress disorder, unspecified: Secondary | ICD-10-CM

## 2018-02-12 NOTE — Progress Notes (Addendum)
Type of Service: Integrated Behavioral Health 2nd F/U Visit Total time:35 minutes :  Interpreter:Yes.    Virgel BouquetJameel Ali from [facility]  Reason for follow-up: Assist patient and wife with getting connected to immigrant and refugee services to assist with housing and financial stressors . Patient accompanied by wife and interpreter.   Screening provided today all 0 which is not a true indication of patient's previous anxiety and depression levels.  Informed patient Integrated Care program is not a good match for the severity of their symptoms. Focus today would ZO:XWRUEAVbe:Getting patient connected to psychiatry and to community resources.  Provided update on PCP psychiatry referral.  Patient appreciative of information.  Depression screen [facility] 02/13/2018 01/31/2018 01/14/2018  Decreased Interest 0 3 0  Down, Depressed, Hopeless 0 3 1  PHQ - 2 Score 0 6 1  Altered sleeping 0 3 -  Tired, decreased energy 0 3 -  Change in appetite 0 3 -  Feeling bad or failure about yourself  0 3 -  Trouble concentrating 0 3 -  Moving slowly or fidgety/restless 0 2 -  Suicidal thoughts 0 2 -  PHQ-9 Score 0 25 -  Difficult doing work/chores - - -  Goals: Patient will  1. reduce symptoms of: anxiety, depression and stress ,  2. increase  ability WU:JWJXBJof:coping skills, self-management skills and stress reduction,  3. F/U with resources and appointments Intervention: Link to WalgreenCommunity Resources, Reflective listening,  Advocacy/Education   Issues discussed: called Pensions [employer]anta[employer] while patient was in the office to set up appointment.  Informed patient of psychiatric referral completed by PCP  ;  Discussed concerns with previous and current agency assisting patient and family. Patient previously received case management services from [facility] had a problem with this agency and is now working with [employer] [place].  Patient and wife again state [employer] "was not good to them", They did not complete paper  work and help them with appointments.  Patient and wife are willing to try [facility].  Assessment:Patient continues to experience stress due to financial and housing concerns.   Patient may benefit from, and is in agreement to meet with [facility] for case management and supportive services. Plan: [facility] Sept. 3rd at 11:30  Sammuel Hineseborah Rami Budhu, LCSW Licensed Clinical Social Worker [facility]   [phone] 9:19 AM

## 2018-02-12 NOTE — Progress Notes (Signed)
Referral to psych office that has language translation services.

## 2018-02-13 ENCOUNTER — Encounter: Payer: Self-pay | Admitting: Licensed Clinical Social Worker

## 2018-03-03 ENCOUNTER — Ambulatory Visit (INDEPENDENT_AMBULATORY_CARE_PROVIDER_SITE_OTHER): Payer: Medicaid Other | Admitting: Psychology

## 2018-03-03 DIAGNOSIS — F419 Anxiety disorder, unspecified: Secondary | ICD-10-CM

## 2018-03-03 NOTE — Progress Notes (Signed)
Patient showed up at front desk stating that he has an appointment every Monday with a psychologist at the [facility].  As referenced in note dated 8/27, treatment team met and discussed this case.  The recommendations were communicated and documented in a visit dated 02/12/18.  Patient is difficult to understand from a language standpoint (an interpreter was used) and from a health literacy standpoint. He seems to confuse psychiatrists, psychologists, and other mental health providers as well as primary care physicians. This is not uncommon but it makes communication even more challenging.  I attempted to communicate the prior recommendation but I am not sure I was successful. When I focused on how I could help best today, Mr. Laren Everts stated two things:  1) Complete paperwork for Social Services Disability hearing.  As best I can tell, this was a standard Release of Information. I verified this with Nuala Alpha, Practice Manager and gave it to Leory Plowman who handles these things in our office. I told Mr. Ebony Hail that the records would be released, per the ROI, to the Social Security office and that it may take up to two weeks.  2)  A referral for psychiatry (or psychology services) from Dr. Criss Rosales. I am not sure what this is about. Dr. Criss Rosales mentioned it in a note dated 02/12/18 but Kennyth Lose (who also handles referrals) did not have anything on her end. I will send a message to Dr. Criss Rosales and include the IC team so that we can understand the recommendations and provide a consistent message.  In addition, I reiterated the recommendations previously made - for [employer] house and asked for understanding that he does not have a standing appointment at the [facility] to see a psychologist every Monday.  Time spent:  60 minutes in care coordination.

## 2018-03-03 NOTE — Assessment & Plan Note (Signed)
Did not assess current level of anxiety.  Focus of visit was on care coordination.

## 2018-04-02 ENCOUNTER — Ambulatory Visit (INDEPENDENT_AMBULATORY_CARE_PROVIDER_SITE_OTHER): Payer: Medicaid Other | Admitting: *Deleted

## 2018-04-02 DIAGNOSIS — Z23 Encounter for immunization: Secondary | ICD-10-CM | POA: Diagnosis present

## 2018-04-10 ENCOUNTER — Other Ambulatory Visit: Payer: Self-pay | Admitting: Family Medicine

## 2018-04-10 DIAGNOSIS — F419 Anxiety disorder, unspecified: Secondary | ICD-10-CM

## 2018-04-10 NOTE — Progress Notes (Signed)
I've received referal message that psych cannot see this patient because of translator needs, as requested I am referring to neuropsych  -Dr. Parke Simmers

## 2018-04-18 ENCOUNTER — Telehealth: Payer: Self-pay | Admitting: Licensed Clinical Social Worker

## 2018-04-18 NOTE — BH Specialist Note (Signed)
Service : Integrated Behavioral Health F/U Call  Interpretor:Yes.   ; Name: Aaron Reyes [phone]) and Language: Arabic  F/U call to patient's wife ref. [facility] appointments scheduled 04/29/18.  Patient is requesting assistance with getting connected to psychiatry for ongoing mental health concerns.  Reviewed chart: PCP placed Ambulatory referral to Neuropsychology looks it looks like the referral was denied.    [facility] [phone] with patient and wife on the phone, Jovita Kussmaul would not make the appointment if patient did not have an interpreter. Informed LCSW they did not provide interpretors for patient.  Called [employer], [place], and Neuropsychiatry. None provided interpretors.   Called [place] Medicaid office to see if Medicaid was able to assist with interpreter resources.  Per Meagan they were not able to provide this service. Each agency is responsible.    Patient and wife provided verbal approval for LCSW to make referral to [facility] and [facility] [place] for care coordination due to language barriers.  Will also F/U with referral coordinator to obtain more information and provide PCP an update.    Sammuel Hines, LCSW Behavioral Health Clinician [facility]   [phone] 5:00 PM

## 2018-04-18 NOTE — Telephone Encounter (Signed)
Service : Integrated Behavioral Health F/U Call  Interpretor:Yes.   ; Name: Ahmed [phone]) and Language: Arabic  F/U call to patient's wife ref. [facility] appointments scheduled 04/29/18.  Patient is requesting assistance with getting connected to psychiatry for ongoing mental health concerns.  Reviewed chart: PCP placed Ambulatory referral to Neuropsychology looks it looks like the referral was denied.    [facility] [phone] with patient and wife on the phone, Jovita Kussmaul would not make the appointment if patient did not have an interpreter. Informed LCSW they did not provide interpretors for patient.  Called [employer], [place], and Neuropsychiatry. None provided interpretors.   Called [place] Medicaid office to see if Medicaid was able to assist with interpreter resources.  Per Meagan they were not able to provide this service. Each agency is responsible.    Patient and wife provided verbal approval for LCSW to make referral to [facility] and [facility] [place] for care coordination due to language barriers.   Plan: LCSW will F/U with referral coordinator to obtain more information on referral. Will also provide PCP an update.    Sammuel Hines, LCSW Behavioral Health Clinician [facility]   [phone] 5:00 PM

## 2018-04-24 ENCOUNTER — Telehealth: Payer: Self-pay | Admitting: Licensed Clinical Social Worker

## 2018-04-24 NOTE — Progress Notes (Signed)
Interpretor:Yes.   ; Name: Sam; Language: Arabic; Code: ACSS  F/U call to patient's wife ref. getting him connected to psychiatric services.  Informed patient and wife that [facility] is able to provide interpreter services.  Provided contact information, address and walk-in hours for [facility]. Per wife they will go to [facility] tomorrow for medication management and counseling services.  Appointment no longer needed with [facility] due to unable to meet ongoing therapy needs.   Plan: LCSW will F/U with patient and family in 5 to 7 days.  Sammuel Hines, LCSW Behavioral Health Clinician [facility]   [phone] 3:35 PM

## 2018-04-29 ENCOUNTER — Ambulatory Visit: Payer: Self-pay

## 2018-05-01 ENCOUNTER — Telehealth: Payer: Self-pay | Admitting: Licensed Clinical Social Worker

## 2018-05-01 NOTE — Progress Notes (Signed)
Service : Integrated Behavioral Health F/U Call   Interpretor:Yes ; Name: Aaron Reyes # [id number] and Language: Arabic  F/U call to patient reference getting connected to psychiatry for medication management and going therapy.  Patient and [facility] last week to complete intake and assessment.  2nd appointment has been scheduled to meet with psychiatry. Patient appreciative of F/U call and assistance for care coordination. No additional services needed at this time.  Update provided to PCP.  Plan: Patient will go to psychiatry appointment [facility] Tuesday Nov 19th at 8:00  Sammuel Hineseborah Arlina Sabina, LCSW Behavioral Health Clinician [facility]   [phone] 12:14 PM

## 2018-05-07 ENCOUNTER — Emergency Department (HOSPITAL_COMMUNITY)
Admission: EM | Admit: 2018-05-07 | Discharge: 2018-05-08 | Disposition: A | Discharge status: Home / Self Care | Payer: Medicaid Other | Attending: Emergency Medicine | Admitting: Emergency Medicine

## 2018-05-07 ENCOUNTER — Other Ambulatory Visit: Payer: Self-pay

## 2018-05-07 ENCOUNTER — Emergency Department (HOSPITAL_COMMUNITY): Payer: Medicaid Other

## 2018-05-07 DIAGNOSIS — S299XXA Unspecified injury of thorax, initial encounter: Secondary | ICD-10-CM | POA: Diagnosis present

## 2018-05-07 DIAGNOSIS — S2222XA Fracture of body of sternum, initial encounter for closed fracture: Secondary | ICD-10-CM | POA: Diagnosis not present

## 2018-05-07 DIAGNOSIS — Y92410 Unspecified street and highway as the place of occurrence of the external cause: Secondary | ICD-10-CM | POA: Diagnosis not present

## 2018-05-07 DIAGNOSIS — Y999 Unspecified external cause status: Secondary | ICD-10-CM | POA: Insufficient documentation

## 2018-05-07 DIAGNOSIS — Y9389 Activity, other specified: Secondary | ICD-10-CM | POA: Diagnosis not present

## 2018-05-07 DIAGNOSIS — R4182 Altered mental status, unspecified: Secondary | ICD-10-CM | POA: Insufficient documentation

## 2018-05-07 LAB — COMPREHENSIVE METABOLIC PANEL
ALT: 26 U/L (ref 0–44)
ANION GAP: 6 (ref 5–15)
AST: 21 U/L (ref 15–41)
Albumin: 4.1 g/dL (ref 3.5–5.0)
Alkaline Phosphatase: 69 U/L (ref 38–126)
BUN: 21 mg/dL — ABNORMAL HIGH (ref 6–20)
CHLORIDE: 105 mmol/L (ref 98–111)
CO2: 25 mmol/L (ref 22–32)
CREATININE: 0.89 mg/dL (ref 0.61–1.24)
Calcium: 9.4 mg/dL (ref 8.9–10.3)
GFR calc non Af Amer: 50 mL/min — ABNORMAL LOW (ref >60)
GFR, EST AFRICAN AMERICAN: 58 mL/min — AB (ref >60)
GLUCOSE: 169 mg/dL — AB (ref 70–99)
Potassium: 3.9 mmol/L (ref 3.5–5.1)
SODIUM: 136 mmol/L (ref 135–145)
Total Bilirubin: 0.2 mg/dL — ABNORMAL LOW (ref 0.3–1.2)
Total Protein: 7.2 g/dL (ref 6.5–8.1)

## 2018-05-07 LAB — CBG MONITORING, ED: Glucose-Capillary: 155 mg/dL — ABNORMAL HIGH (ref 70–99)

## 2018-05-07 LAB — CBC
HCT: 49 % (ref 39.0–52.0)
HEMOGLOBIN: 16 g/dL (ref 13.0–17.0)
MCH: 28.9 pg (ref 26.0–34.0)
MCHC: 32.7 g/dL (ref 30.0–36.0)
MCV: 88.4 fL (ref 80.0–100.0)
NRBC: 0 % (ref 0.0–0.2)
PLATELETS: 242 10*3/uL (ref 150–400)
RBC: 5.54 MIL/uL (ref 4.22–5.81)
RDW: 12 % (ref 11.5–15.5)
WBC: 7.2 10*3/uL (ref 4.0–10.5)

## 2018-05-07 LAB — RAPID URINE DRUG SCREEN, HOSP PERFORMED
AMPHETAMINES: NOT DETECTED
BARBITURATES: NOT DETECTED
Benzodiazepines: NOT DETECTED
COCAINE: NOT DETECTED
OPIATES: NOT DETECTED
TETRAHYDROCANNABINOL: NOT DETECTED

## 2018-05-07 LAB — ETHANOL: Alcohol, Ethyl (B): <10 mg/dL (ref <10)

## 2018-05-07 MED ORDER — KETOROLAC TROMETHAMINE 30 MG/ML IJ SOLN
30.0000 mg | Freq: Once | INTRAMUSCULAR | Status: AC
  Administered 2018-05-07: 30 mg via INTRAVENOUS
  Filled 2018-05-07: qty 1

## 2018-05-07 MED ORDER — MORPHINE SULFATE (PF) 4 MG/ML IV SOLN
4.0000 mg | Freq: Once | INTRAVENOUS | Status: AC
  Administered 2018-05-07: 4 mg via INTRAVENOUS
  Filled 2018-05-07: qty 1

## 2018-05-07 MED ORDER — ONDANSETRON HCL 4 MG/2ML IJ SOLN
4.0000 mg | Freq: Once | INTRAMUSCULAR | Status: AC
  Administered 2018-05-07: 4 mg via INTRAVENOUS

## 2018-05-07 MED ORDER — OXYCODONE-ACETAMINOPHEN 5-325 MG PO TABS: 1.0000 | ORAL_TABLET | ORAL | 0 refills | Status: DC | PRN

## 2018-05-07 MED ORDER — ONDANSETRON HCL 4 MG/2ML IJ SOLN
INTRAMUSCULAR | Status: AC
  Filled 2018-05-07: qty 2

## 2018-05-07 MED ORDER — HYDROMORPHONE HCL 1 MG/ML IJ SOLN
1.0000 mg | Freq: Once | INTRAMUSCULAR | Status: AC
  Administered 2018-05-07: 1 mg via INTRAVENOUS
  Filled 2018-05-07: qty 1

## 2018-05-07 MED ORDER — IBUPROFEN 400 MG PO TABS
600.0000 mg | ORAL_TABLET | Freq: Once | ORAL | Status: DC
  Filled 2018-05-07: qty 1

## 2018-05-07 MED ORDER — SODIUM CHLORIDE 0.9 % IV BOLUS
1000.0000 mL | Freq: Once | INTRAVENOUS | Status: AC
  Administered 2018-05-07: 1000 mL via INTRAVENOUS

## 2018-05-07 MED ORDER — IOHEXOL 300 MG/ML  SOLN
100.0000 mL | Freq: Once | INTRAMUSCULAR | Status: AC | PRN
  Administered 2018-05-07: 100 mL via INTRAVENOUS

## 2018-05-07 MED ORDER — OXYCODONE-ACETAMINOPHEN 5-325 MG PO TABS
1.0000 | ORAL_TABLET | Freq: Once | ORAL | Status: AC
  Administered 2018-05-07: 1 via ORAL
  Filled 2018-05-07: qty 1
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
## 2018-05-07 NOTE — ED Notes (Signed)
Pt moaning does not answer questions asked

## 2018-05-07 NOTE — ED Notes (Signed)
The pt speaks no english ?????

## 2018-05-07 NOTE — ED Notes (Signed)
bp 160/100
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
## 2018-05-07 NOTE — ED Notes (Signed)
 4mg  zofran and 1L bolus given by Wendie ChessKevin RN
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
## 2018-05-13 ENCOUNTER — Emergency Department (HOSPITAL_COMMUNITY): Payer: Medicaid Other

## 2018-05-13 ENCOUNTER — Other Ambulatory Visit: Payer: Self-pay

## 2018-05-13 ENCOUNTER — Emergency Department (HOSPITAL_COMMUNITY)
Admission: EM | Admit: 2018-05-13 | Discharge: 2018-05-13 | Disposition: A | Discharge status: Home / Self Care | Payer: Medicaid Other | Attending: Emergency Medicine | Admitting: Emergency Medicine

## 2018-05-13 DIAGNOSIS — Y999 Unspecified external cause status: Secondary | ICD-10-CM | POA: Diagnosis not present

## 2018-05-13 DIAGNOSIS — S2222XA Fracture of body of sternum, initial encounter for closed fracture: Secondary | ICD-10-CM | POA: Insufficient documentation

## 2018-05-13 DIAGNOSIS — Y929 Unspecified place or not applicable: Secondary | ICD-10-CM | POA: Insufficient documentation

## 2018-05-13 DIAGNOSIS — Z79899 Other long term (current) drug therapy: Secondary | ICD-10-CM | POA: Diagnosis not present

## 2018-05-13 DIAGNOSIS — Z7982 Long term (current) use of aspirin: Secondary | ICD-10-CM | POA: Insufficient documentation

## 2018-05-13 DIAGNOSIS — F17210 Nicotine dependence, cigarettes, uncomplicated: Secondary | ICD-10-CM | POA: Diagnosis not present

## 2018-05-13 DIAGNOSIS — W228XXA Striking against or struck by other objects, initial encounter: Secondary | ICD-10-CM | POA: Diagnosis not present

## 2018-05-13 DIAGNOSIS — Y939 Activity, unspecified: Secondary | ICD-10-CM | POA: Insufficient documentation

## 2018-05-13 DIAGNOSIS — S2222XD Fracture of body of sternum, subsequent encounter for fracture with routine healing: Secondary | ICD-10-CM

## 2018-05-13 IMAGING — DX DG CHEST 1V PORT
1 series · 1 of 1 positions shown · non-contrast
Comparison: None available.

CLINICAL DATA: Initial evaluation for acute chest pain.

EXAM:
PORTABLE CHEST 1 VIEW

[chest ap]
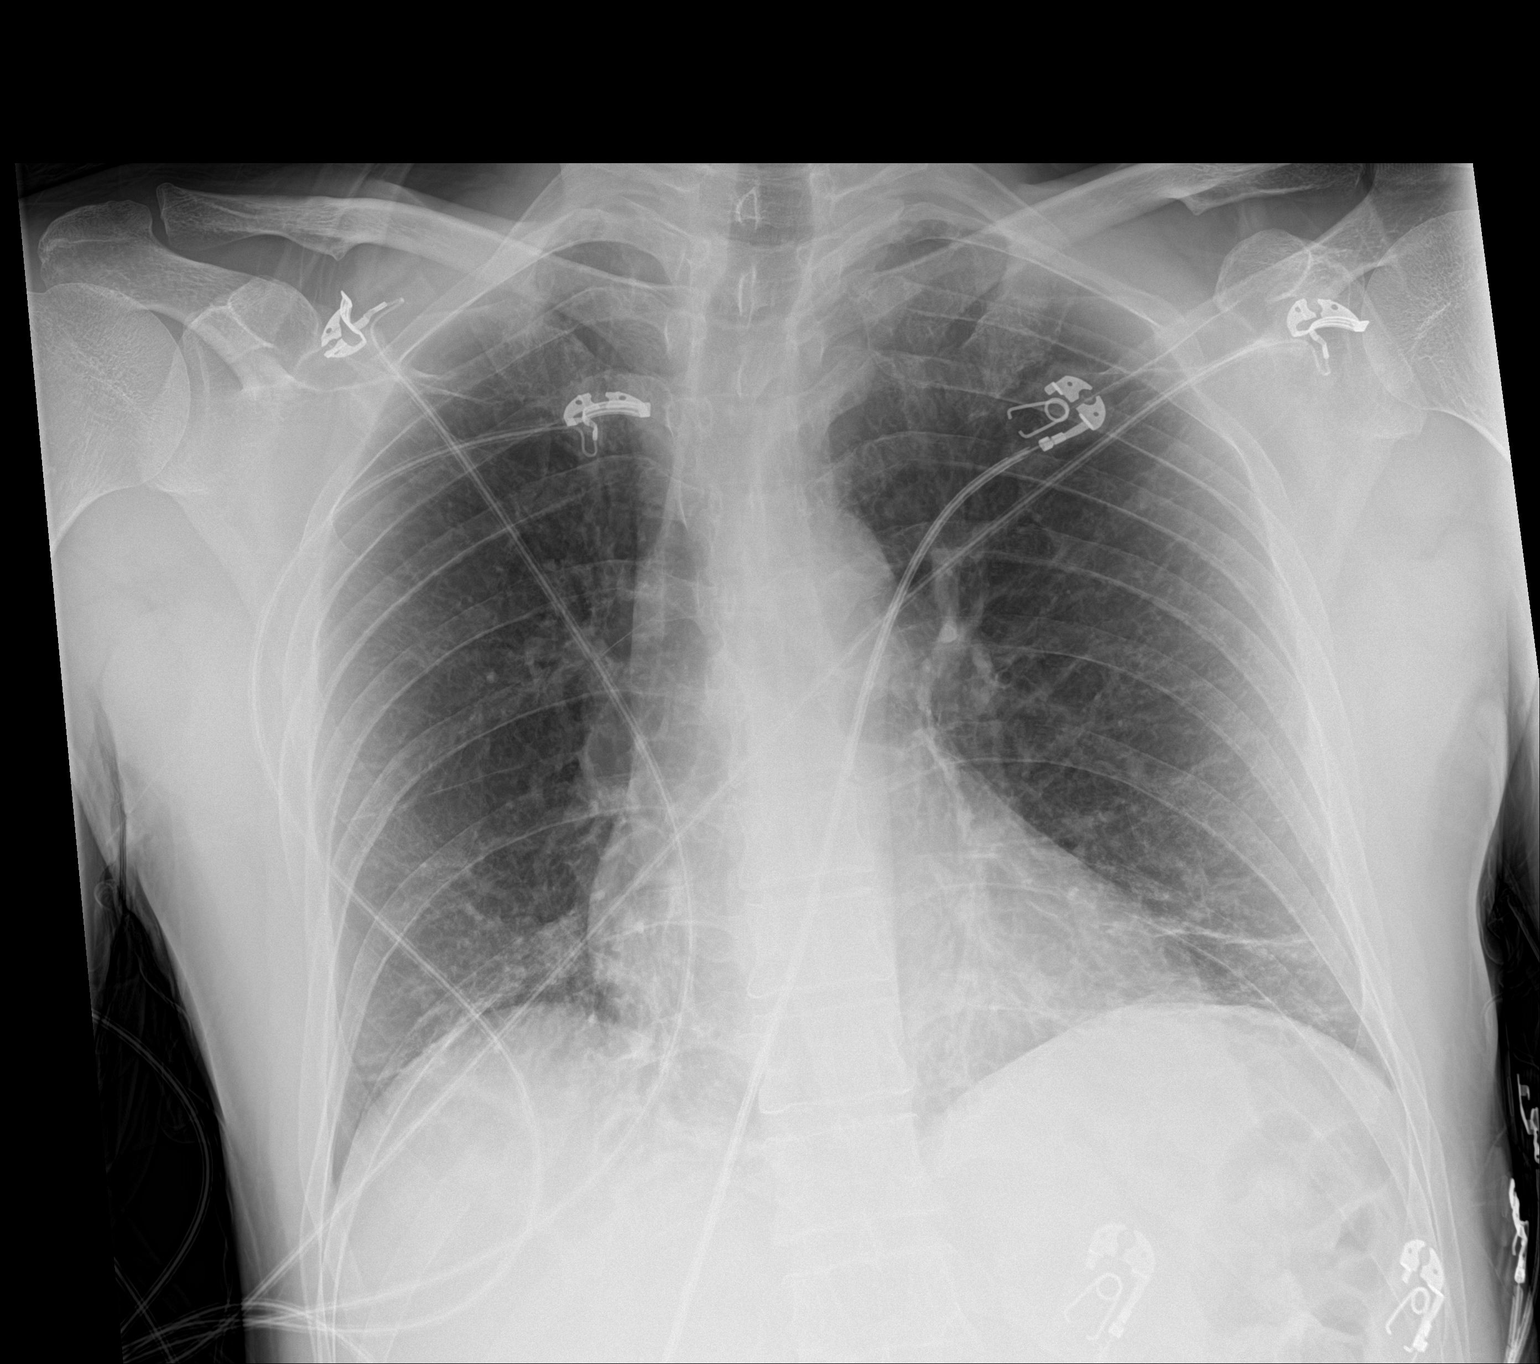

[1 of 1 positions shown; findings below may reference images not displayed]

FINDINGS: Transverse heart size at the upper limits of normal. Mediastinal
silhouette within normal limits.

Lungs mildly hypoinflated. Scattered bibasilar subsegmental
atelectasis. No focal infiltrates. No pulmonary edema or pleural
effusion. No pneumothorax.

No acute osseous abnormality.
IMPRESSION: 1. Shallow lung inflation with scattered bibasilar subsegmental
atelectasis.
2. No other active cardiopulmonary disease.

## 2018-05-13 MED ORDER — DIAZEPAM 5 MG PO TABS
5.0000 mg | ORAL_TABLET | Freq: Once | ORAL | Status: AC
  Administered 2018-05-13: 5 mg via ORAL
  Filled 2018-05-13: qty 1

## 2018-05-13 MED ORDER — OXYCODONE HCL 5 MG PO TABS
5.0000 mg | ORAL_TABLET | Freq: Once | ORAL | Status: AC
  Administered 2018-05-13: 5 mg via ORAL
  Filled 2018-05-13: qty 1

## 2018-05-13 MED ORDER — KETOROLAC TROMETHAMINE 60 MG/2ML IM SOLN
15.0000 mg | Freq: Once | INTRAMUSCULAR | Status: AC
  Administered 2018-05-13: 15 mg via INTRAMUSCULAR
  Filled 2018-05-13: qty 2

## 2018-05-13 MED ORDER — ACETAMINOPHEN 500 MG PO TABS
1000.0000 mg | ORAL_TABLET | Freq: Once | ORAL | Status: AC
  Administered 2018-05-13: 1000 mg via ORAL
  Filled 2018-05-13: qty 2

## 2018-05-13 NOTE — Discharge Instructions (Signed)
Incentive spirometry 10 min out of every hour you are awake.
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
## 2018-06-02 ENCOUNTER — Telehealth: Payer: Self-pay | Admitting: Family Medicine
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
## 2018-06-03 ENCOUNTER — Ambulatory Visit: Payer: Self-pay | Admitting: Student in an Organized Health Care Education/Training Program

## 2018-06-03 ENCOUNTER — Other Ambulatory Visit: Payer: Self-pay

## 2018-06-03 ENCOUNTER — Encounter (HOSPITAL_COMMUNITY): Payer: Self-pay

## 2018-06-03 ENCOUNTER — Ambulatory Visit (HOSPITAL_COMMUNITY)
Admission: EM | Admit: 2018-06-03 | Discharge: 2018-06-03 | Disposition: A | Discharge status: Home / Self Care | Payer: Medicaid Other | Attending: Family Medicine | Admitting: Family Medicine

## 2018-06-03 ENCOUNTER — Encounter (HOSPITAL_COMMUNITY): Payer: Self-pay | Admitting: *Deleted

## 2018-06-03 ENCOUNTER — Emergency Department (HOSPITAL_COMMUNITY): Payer: Medicaid Other

## 2018-06-03 ENCOUNTER — Emergency Department (HOSPITAL_COMMUNITY)
Admission: EM | Admit: 2018-06-03 | Discharge: 2018-06-03 | Discharge status: Against Medical Advice | Payer: Medicaid Other | Attending: Emergency Medicine | Admitting: Emergency Medicine

## 2018-06-03 DIAGNOSIS — Z79899 Other long term (current) drug therapy: Secondary | ICD-10-CM | POA: Diagnosis not present

## 2018-06-03 DIAGNOSIS — R0789 Other chest pain: Secondary | ICD-10-CM | POA: Insufficient documentation

## 2018-06-03 DIAGNOSIS — S2220XS Unspecified fracture of sternum, sequela: Secondary | ICD-10-CM | POA: Insufficient documentation

## 2018-06-03 DIAGNOSIS — R05 Cough: Secondary | ICD-10-CM | POA: Diagnosis not present

## 2018-06-03 DIAGNOSIS — R0602 Shortness of breath: Secondary | ICD-10-CM | POA: Diagnosis not present

## 2018-06-03 DIAGNOSIS — R071 Chest pain on breathing: Secondary | ICD-10-CM | POA: Diagnosis not present

## 2018-06-03 DIAGNOSIS — I252 Old myocardial infarction: Secondary | ICD-10-CM | POA: Insufficient documentation

## 2018-06-03 DIAGNOSIS — F17210 Nicotine dependence, cigarettes, uncomplicated: Secondary | ICD-10-CM | POA: Insufficient documentation

## 2018-06-03 DIAGNOSIS — R059 Cough, unspecified: Secondary | ICD-10-CM

## 2018-06-03 LAB — BASIC METABOLIC PANEL
Anion gap: 10 (ref 5–15)
BUN: 11 mg/dL (ref 6–20)
CALCIUM: 9.4 mg/dL (ref 8.9–10.3)
CO2: 24 mmol/L (ref 22–32)
CREATININE: 0.66 mg/dL (ref 0.61–1.24)
Chloride: 106 mmol/L (ref 98–111)
GFR calc Af Amer: >60 mL/min (ref >60)
GLUCOSE: 80 mg/dL (ref 70–99)
POTASSIUM: 4 mmol/L (ref 3.5–5.1)
SODIUM: 140 mmol/L (ref 135–145)

## 2018-06-03 LAB — CBC
HCT: 44.6 % (ref 39.0–52.0)
HEMOGLOBIN: 14.9 g/dL (ref 13.0–17.0)
MCH: 28.5 pg (ref 26.0–34.0)
MCHC: 33.4 g/dL (ref 30.0–36.0)
MCV: 85.4 fL (ref 80.0–100.0)
Platelets: 293 10*3/uL (ref 150–400)
RBC: 5.22 MIL/uL (ref 4.22–5.81)
RDW: 11.6 % (ref 11.5–15.5)
WBC: 8.4 10*3/uL (ref 4.0–10.5)
nRBC: 0 % (ref 0.0–0.2)

## 2018-06-03 LAB — I-STAT TROPONIN, ED: TROPONIN I, POC: 0.01 ng/mL (ref 0.00–0.08)

## 2018-06-03 IMAGING — DX DG CHEST 2V
2 series · 2 of 2 positions shown · non-contrast
Comparison: [DATE]

CLINICAL DATA: Mid chest pain.  Shortness of breath.

EXAM:
CHEST - 2 VIEW

[chest pa]
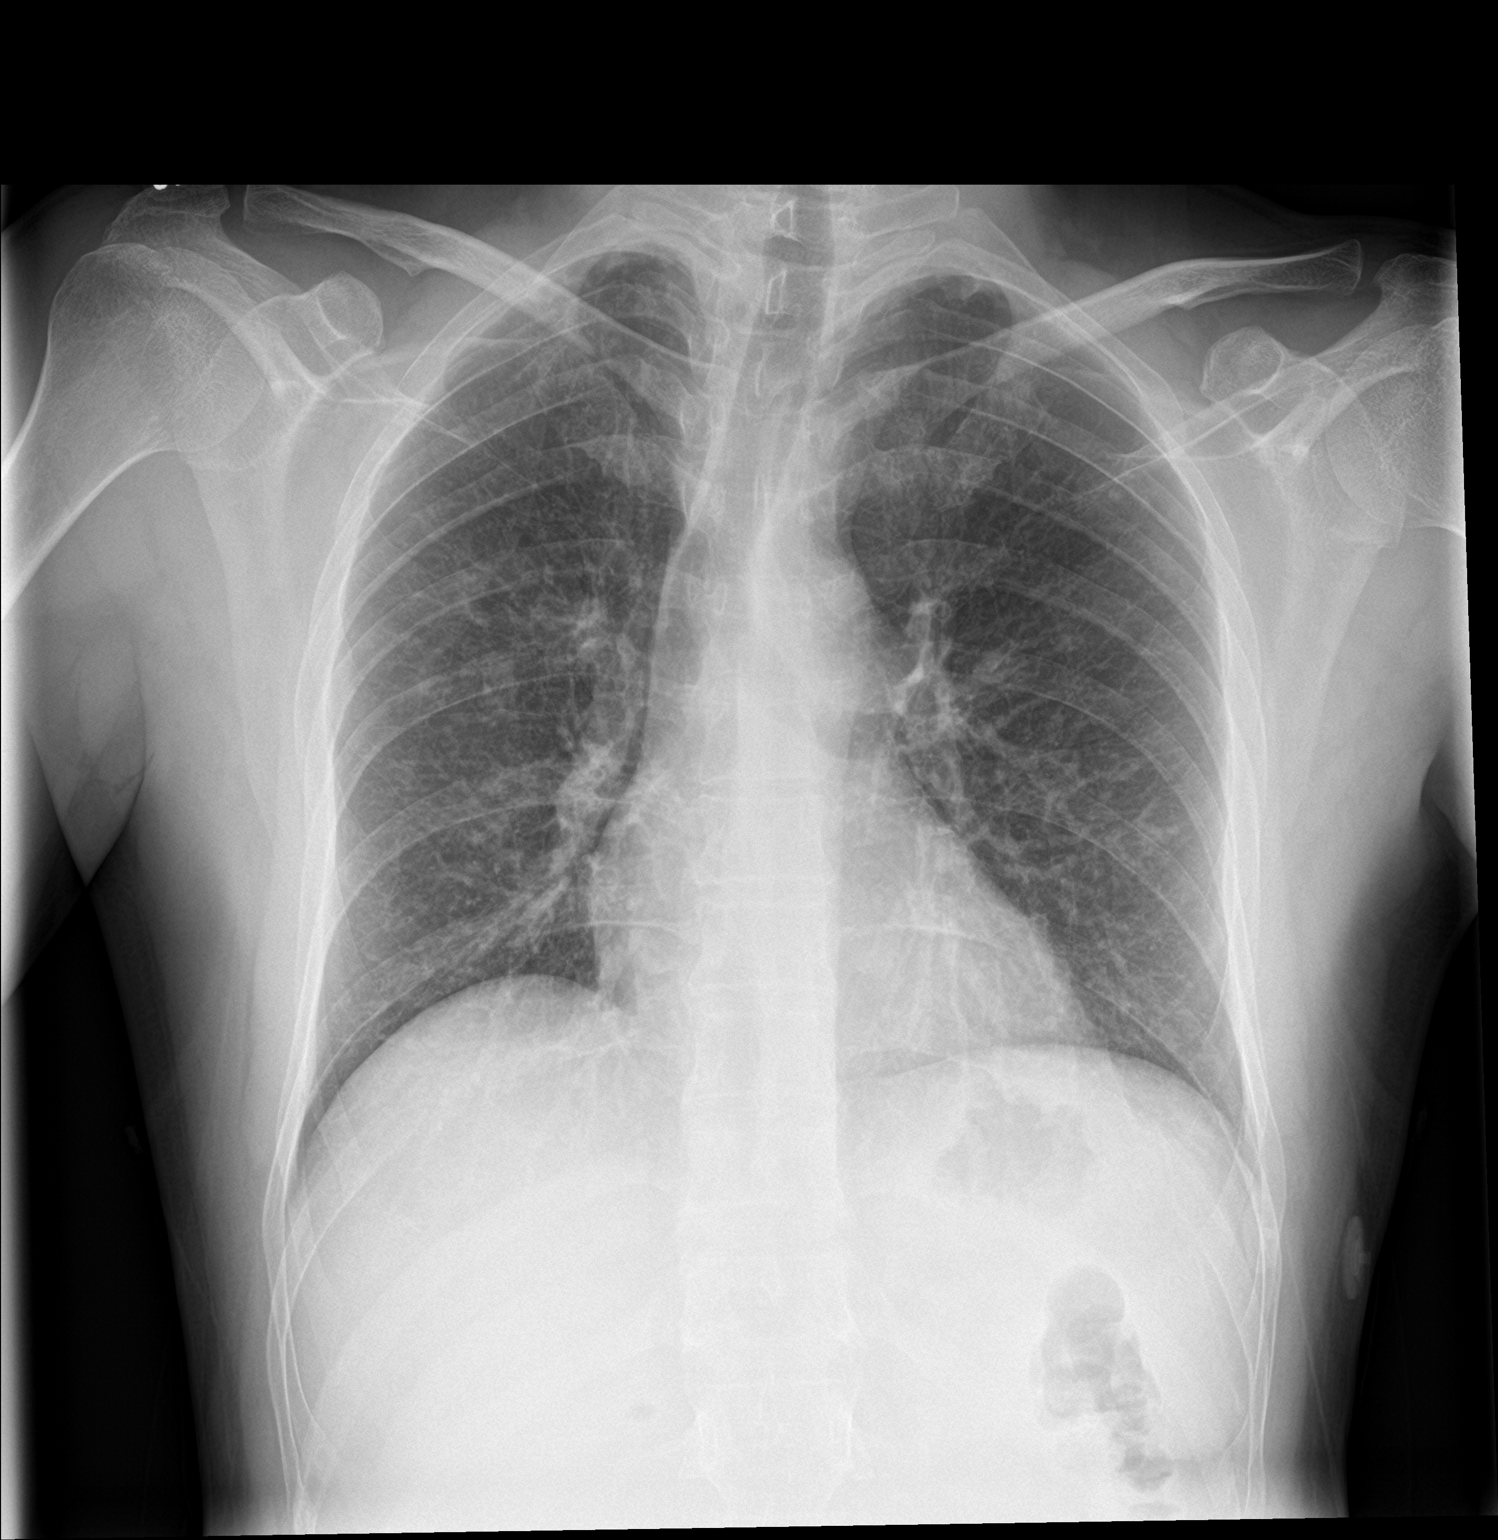

[chest lat]
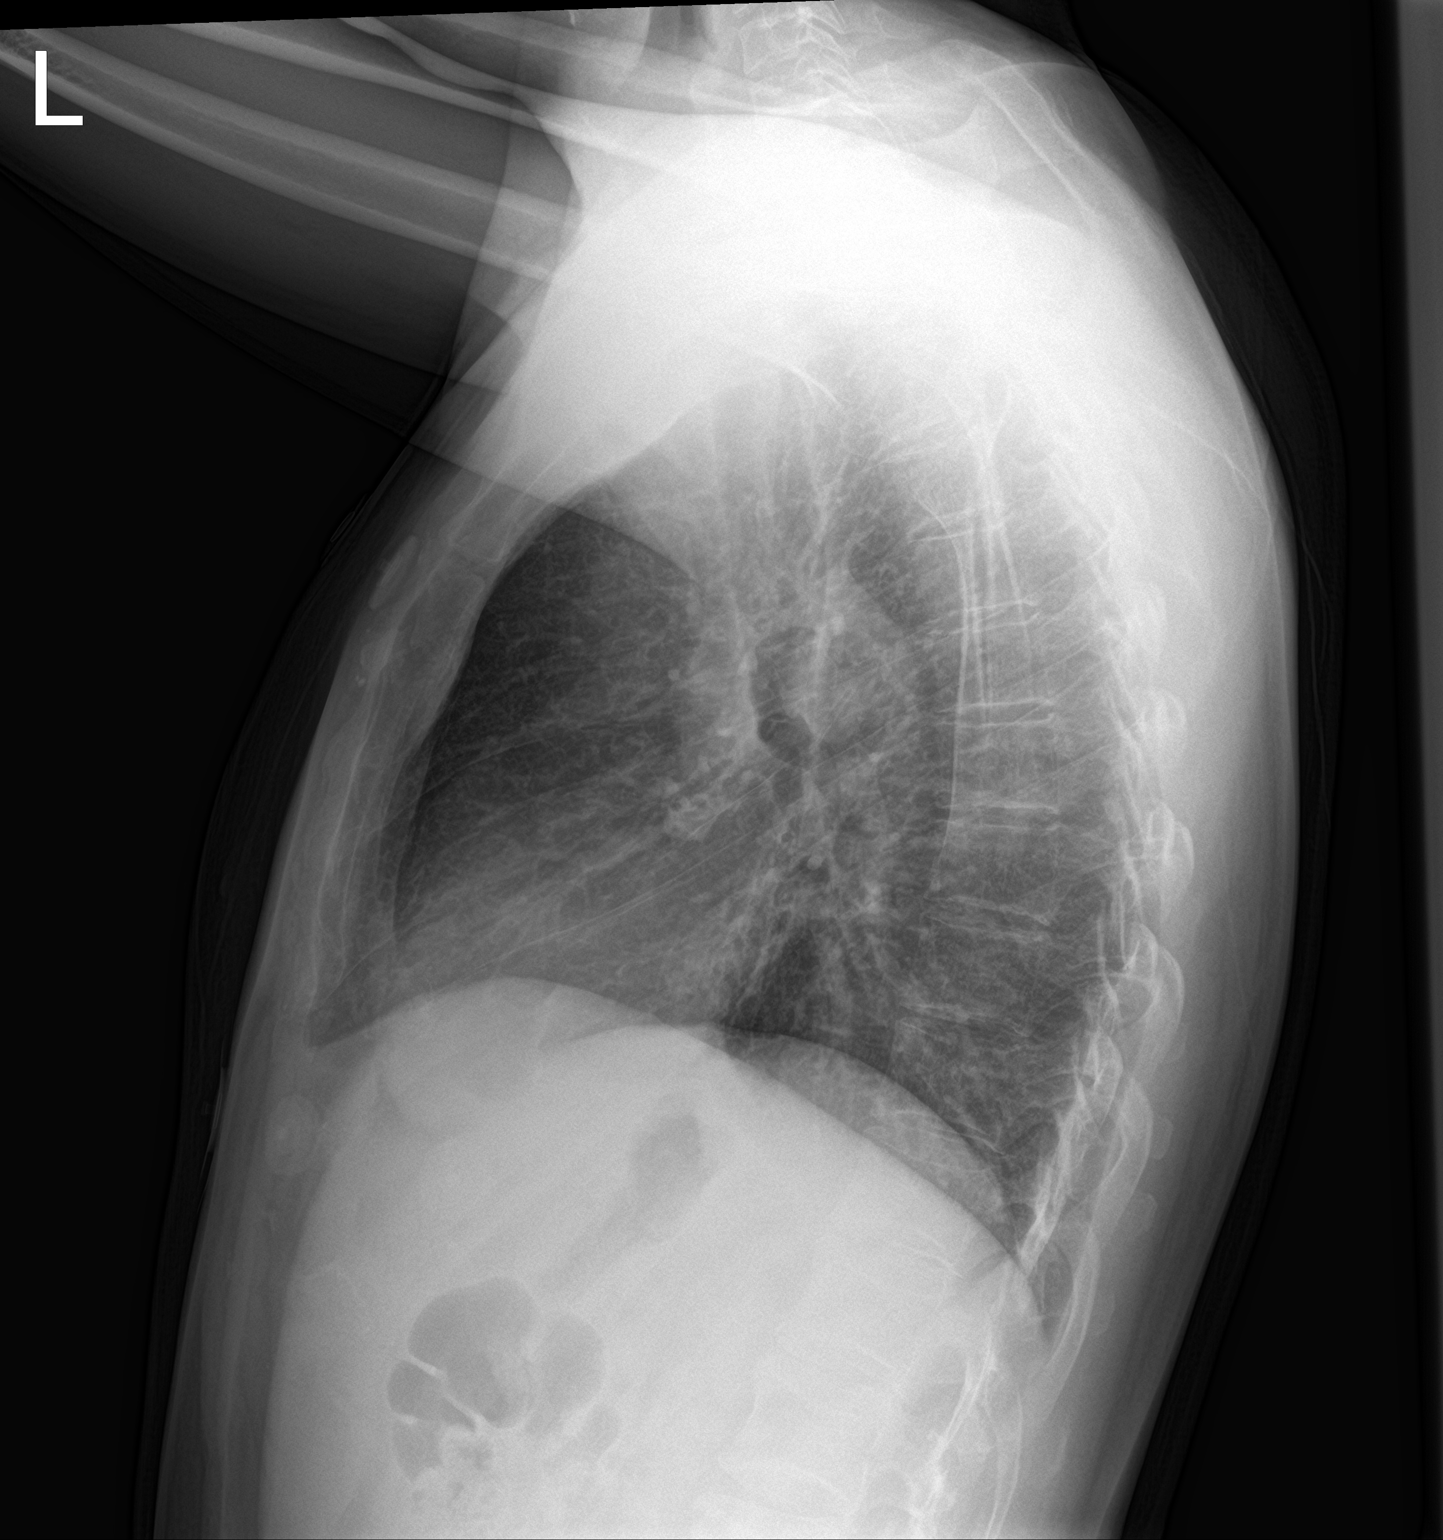

[2 of 2 positions shown; findings below may reference images not displayed]

FINDINGS: The heart size and mediastinal contours are within normal limits.
Both lungs are clear. The visualized skeletal structures are
unremarkable.
IMPRESSION: No active cardiopulmonary disease.

## 2018-06-03 MED ORDER — KETOROLAC TROMETHAMINE 60 MG/2ML IM SOLN
60.0000 mg | Freq: Once | INTRAMUSCULAR | Status: AC
  Administered 2018-06-03: 60 mg via INTRAMUSCULAR
  Filled 2018-06-03: qty 2

## 2018-06-03 MED ORDER — LIDOCAINE 5 % EX PTCH: 1.0000 | MEDICATED_PATCH | CUTANEOUS | 0 refills | Status: AC

## 2018-06-03 MED ORDER — BENZONATATE 100 MG PO CAPS: 100.0000 mg | ORAL_CAPSULE | Freq: Three times a day (TID) | ORAL | 0 refills | Status: AC

## 2018-06-03 MED ORDER — LIDOCAINE 5 % EX PTCH
1.0000 | MEDICATED_PATCH | CUTANEOUS | Status: DC
  Administered 2018-06-03: 1 via TRANSDERMAL
  Filled 2018-06-03: qty 1

## 2018-06-03 MED ORDER — IBUPROFEN 800 MG PO TABS: 800.0000 mg | ORAL_TABLET | Freq: Three times a day (TID) | ORAL | 0 refills | Status: DC | PRN

## 2018-06-03 NOTE — ED Notes (Signed)
DC instructions discussed at length through interpretor. Pt states understanding through interpretor.
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
## 2018-06-03 NOTE — Telephone Encounter (Addendum)
Pt seen in ED today

## 2018-06-03 NOTE — Discharge Instructions (Addendum)
Due to worsening of chest pain, shortness of breath and difficulty breathing due to pain, recommend go to the ER for further evaluation.
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
## 2018-06-03 NOTE — ED Notes (Signed)
Discharged using arabic interpreter. [facility] by [facility]

## 2018-06-04 ENCOUNTER — Telehealth: Payer: Self-pay | Admitting: Family Medicine
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
## 2018-06-09 ENCOUNTER — Encounter: Payer: Self-pay | Admitting: Family Medicine

## 2018-06-09 ENCOUNTER — Ambulatory Visit: Payer: Medicaid Other | Admitting: Family Medicine

## 2018-06-09 VITALS — BP 110/70 | HR 89 | Temp 98.2°F | Wt 149.4 lb

## 2018-06-09 DIAGNOSIS — R0789 Other chest pain: Secondary | ICD-10-CM | POA: Diagnosis not present

## 2018-06-09 DIAGNOSIS — S2222XA Fracture of body of sternum, initial encounter for closed fracture: Secondary | ICD-10-CM | POA: Insufficient documentation

## 2018-06-09 DIAGNOSIS — S2222XD Fracture of body of sternum, subsequent encounter for fracture with routine healing: Secondary | ICD-10-CM | POA: Diagnosis not present

## 2018-06-09 HISTORY — DX: Fracture of body of sternum, initial encounter for closed fracture: S22.22XA

## 2018-06-09 MED ORDER — OXYCODONE-ACETAMINOPHEN 5-325 MG PO TABS: 1.0000 | ORAL_TABLET | ORAL | 0 refills | Status: DC | PRN

## 2018-06-09 MED ORDER — BENZONATATE 100 MG PO CAPS: 100.0000 mg | ORAL_CAPSULE | Freq: Two times a day (BID) | ORAL | 0 refills | Status: DC | PRN

## 2018-06-09 MED ORDER — MELOXICAM 15 MG PO TABS: 15.0000 mg | ORAL_TABLET | Freq: Every day | ORAL | 0 refills | Status: DC
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
## 2018-06-23 ENCOUNTER — Telehealth: Payer: Self-pay | Admitting: Family Medicine

## 2018-06-23 NOTE — Telephone Encounter (Signed)
Patient has appt on 06/27/18 but is requesting pain medicine for pain in his chest that he says he discussed with doctor on visit in December.

## 2018-06-23 NOTE — Telephone Encounter (Signed)
Sending to Dr. Primitivo Gauze as he is the Dr. Claiborne Billings saw pt in December. Sunday Spillers, CMA

## 2018-06-25 MED ORDER — OXYCODONE-ACETAMINOPHEN 5-325 MG PO TABS: 1.0000 | ORAL_TABLET | ORAL | 0 refills | Status: DC | PRN

## 2018-06-25 NOTE — Telephone Encounter (Signed)
Sent in for an additional 5 tablets since I only gave him 15 of percocet. Needs to keep follow up appointment on 1/10.  Myrene Buddy MD PGY-2 Family Medicine Resident

## 2018-06-27 ENCOUNTER — Encounter: Payer: Self-pay | Admitting: Family Medicine

## 2018-06-27 ENCOUNTER — Ambulatory Visit: Payer: Medicaid Other | Admitting: Family Medicine

## 2018-06-27 VITALS — BP 108/60 | HR 88 | Temp 98.2°F | Ht 65.0 in | Wt 147.2 lb

## 2018-06-27 DIAGNOSIS — F419 Anxiety disorder, unspecified: Secondary | ICD-10-CM

## 2018-06-27 DIAGNOSIS — R059 Cough, unspecified: Secondary | ICD-10-CM

## 2018-06-27 DIAGNOSIS — R7309 Other abnormal glucose: Secondary | ICD-10-CM | POA: Diagnosis present

## 2018-06-27 DIAGNOSIS — S2222XD Fracture of body of sternum, subsequent encounter for fracture with routine healing: Secondary | ICD-10-CM | POA: Diagnosis not present

## 2018-06-27 DIAGNOSIS — R7303 Prediabetes: Secondary | ICD-10-CM | POA: Diagnosis not present

## 2018-06-27 DIAGNOSIS — R05 Cough: Secondary | ICD-10-CM | POA: Diagnosis not present

## 2018-06-27 LAB — POCT GLYCOSYLATED HEMOGLOBIN (HGB A1C): HEMOGLOBIN A1C: 6.2 % — AB (ref 4.0–5.6)

## 2018-06-27 MED ORDER — TRAMADOL HCL 50 MG PO TABS: 50.0000 mg | ORAL_TABLET | Freq: Two times a day (BID) | ORAL | 0 refills | Status: AC

## 2018-06-27 MED ORDER — BENZONATATE 100 MG PO CAPS: 100.0000 mg | ORAL_CAPSULE | Freq: Two times a day (BID) | ORAL | 0 refills | Status: DC | PRN

## 2018-06-27 MED ORDER — METFORMIN HCL 500 MG PO TABS: 500.0000 mg | ORAL_TABLET | Freq: Every day | ORAL | 0 refills | Status: DC
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
## 2018-06-28 DIAGNOSIS — R05 Cough: Secondary | ICD-10-CM | POA: Insufficient documentation

## 2018-06-28 DIAGNOSIS — R059 Cough, unspecified: Secondary | ICD-10-CM | POA: Insufficient documentation

## 2018-06-28 DIAGNOSIS — R7309 Other abnormal glucose: Secondary | ICD-10-CM | POA: Insufficient documentation
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
## 2018-06-28 NOTE — Assessment & Plan Note (Signed)
Patient with prior history of elevated glucose and A1c of 6.8.  Recheck today at 6.2

## 2018-06-28 NOTE — Assessment & Plan Note (Signed)
Patient with prior A1c of 6.8, rechecked in the office today at 6.2.  We discussed this puts him in the prediabetic range and that he needs to begin diet and exercise control.  Shared decision making and offering metformin has decided they would like to go ahead and start.  We talked about ways to lower blood sugar via diet control and patient will make attempt, will recheck in next visit in approximately 3 months.

## 2018-06-28 NOTE — Assessment & Plan Note (Signed)
Minor seasonal URI with nonproductive cough and no febrile symptoms.   No ear or eye symptoms, no chest pain no shortness of breath, no activity intolerance  Tessalon Perles offered for symptom control at patient's request.
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
## 2018-07-11 ENCOUNTER — Telehealth: Payer: Self-pay | Admitting: Family Medicine

## 2018-07-11 NOTE — Telephone Encounter (Signed)
DMV form dropped off for at front desk for completion.  Verified that patient section of form has been completed.  Last DOS/WCC with PCP wasBland   Placed form in team folder to be completed by clinical staff.  Aaron Reyes

## 2018-07-14 ENCOUNTER — Ambulatory Visit: Payer: Medicaid Other

## 2018-07-14 ENCOUNTER — Ambulatory Visit (INDEPENDENT_AMBULATORY_CARE_PROVIDER_SITE_OTHER): Payer: Medicaid Other | Admitting: Family Medicine

## 2018-07-14 ENCOUNTER — Encounter: Payer: Self-pay | Admitting: Family Medicine

## 2018-07-14 VITALS — BP 117/70 | HR 71 | Temp 97.8°F | Wt 148.2 lb

## 2018-07-14 DIAGNOSIS — S2222XD Fracture of body of sternum, subsequent encounter for fracture with routine healing: Secondary | ICD-10-CM | POA: Diagnosis not present

## 2018-07-14 DIAGNOSIS — E119 Type 2 diabetes mellitus without complications: Secondary | ICD-10-CM

## 2018-07-14 DIAGNOSIS — F411 Generalized anxiety disorder: Secondary | ICD-10-CM

## 2018-07-14 MED ORDER — BLOOD GLUCOSE MONITOR KIT: PACK | 0 refills | Status: DC
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
## 2018-07-14 NOTE — Patient Instructions (Addendum)
  It was wonderful to see you today.  Thank you for choosing [facility].   Please call [phone] with any questions about today's appointment.  Please be sure to schedule follow up at the front  desk before you leave today.   Terisa Starr, MD  Family Medicine   1. Call the eye doctor to schedule an appointment 2. I will call with an interpreter when I complete this paperwork  3. Please make an appointment in about 4 weeks with Dr. Parke Simmers

## 2018-07-14 NOTE — Telephone Encounter (Signed)
Pt had appointment today and brought the paper work with him and it was left with Dr. Manson Passey to be completed. Aaron Reyes, [name], [place]

## 2018-07-15 ENCOUNTER — Encounter: Payer: Self-pay | Admitting: Pediatric Intensive Care

## 2018-07-16 ENCOUNTER — Telehealth: Payer: Self-pay | Admitting: *Deleted

## 2018-07-16 NOTE — Telephone Encounter (Signed)
Contacted pt and informed him that his paperwork has been completed and there is one section he will need to sign before his takes the form. Used [employer] Interpreter Mohamed# [phone] to communicate this to pt. Lamonte SakaiZimmerman Rumple, [name], [place]
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
## 2018-10-03 ENCOUNTER — Telehealth (INDEPENDENT_AMBULATORY_CARE_PROVIDER_SITE_OTHER): Payer: Medicaid Other | Admitting: Family Medicine

## 2018-10-03 ENCOUNTER — Encounter (HOSPITAL_COMMUNITY): Payer: Self-pay | Admitting: Pharmacy Technician

## 2018-10-03 ENCOUNTER — Emergency Department (HOSPITAL_COMMUNITY): Payer: Medicaid Other

## 2018-10-03 ENCOUNTER — Emergency Department (HOSPITAL_COMMUNITY)
Admission: EM | Admit: 2018-10-03 | Discharge: 2018-10-03 | Disposition: A | Discharge status: Home / Self Care | Payer: Medicaid Other | Attending: Emergency Medicine | Admitting: Emergency Medicine

## 2018-10-03 ENCOUNTER — Other Ambulatory Visit: Payer: Self-pay

## 2018-10-03 DIAGNOSIS — Z20822 Contact with and (suspected) exposure to covid-19: Secondary | ICD-10-CM

## 2018-10-03 DIAGNOSIS — Z7982 Long term (current) use of aspirin: Secondary | ICD-10-CM | POA: Insufficient documentation

## 2018-10-03 DIAGNOSIS — F17210 Nicotine dependence, cigarettes, uncomplicated: Secondary | ICD-10-CM | POA: Insufficient documentation

## 2018-10-03 DIAGNOSIS — R6889 Other general symptoms and signs: Secondary | ICD-10-CM

## 2018-10-03 DIAGNOSIS — R079 Chest pain, unspecified: Secondary | ICD-10-CM | POA: Diagnosis not present

## 2018-10-03 DIAGNOSIS — Z79899 Other long term (current) drug therapy: Secondary | ICD-10-CM | POA: Diagnosis not present

## 2018-10-03 LAB — CBC
HCT: 46.1 % (ref 39.0–52.0)
Hemoglobin: 16.1 g/dL (ref 13.0–17.0)
MCH: 30 pg (ref 26.0–34.0)
MCHC: 34.9 g/dL (ref 30.0–36.0)
MCV: 86 fL (ref 80.0–100.0)
Platelets: 261 10*3/uL (ref 150–400)
RBC: 5.36 MIL/uL (ref 4.22–5.81)
RDW: 12.1 % (ref 11.5–15.5)
WBC: 7.7 10*3/uL (ref 4.0–10.5)
nRBC: 0 % (ref 0.0–0.2)

## 2018-10-03 LAB — BASIC METABOLIC PANEL
Anion gap: 8 (ref 5–15)
BUN: 14 mg/dL (ref 6–20)
CO2: 27 mmol/L (ref 22–32)
Calcium: 9.6 mg/dL (ref 8.9–10.3)
Chloride: 105 mmol/L (ref 98–111)
Creatinine, Ser: 0.7 mg/dL (ref 0.61–1.24)
GFR calc Af Amer: >60 mL/min (ref >60)
GFR calc non Af Amer: >60 mL/min (ref >60)
Glucose, Bld: 128 mg/dL — ABNORMAL HIGH (ref 70–99)
Potassium: 3.9 mmol/L (ref 3.5–5.1)
Sodium: 140 mmol/L (ref 135–145)

## 2018-10-03 LAB — TROPONIN I: Troponin I: <0.03 ng/mL (ref <0.03)

## 2018-10-03 IMAGING — DX PORTABLE CHEST - 1 VIEW
1 series · 1 of 1 positions shown · non-contrast
Comparison: [DATE]

CLINICAL DATA: Cough and chest pain

EXAM:
PORTABLE CHEST 1 VIEW

[chest ap]
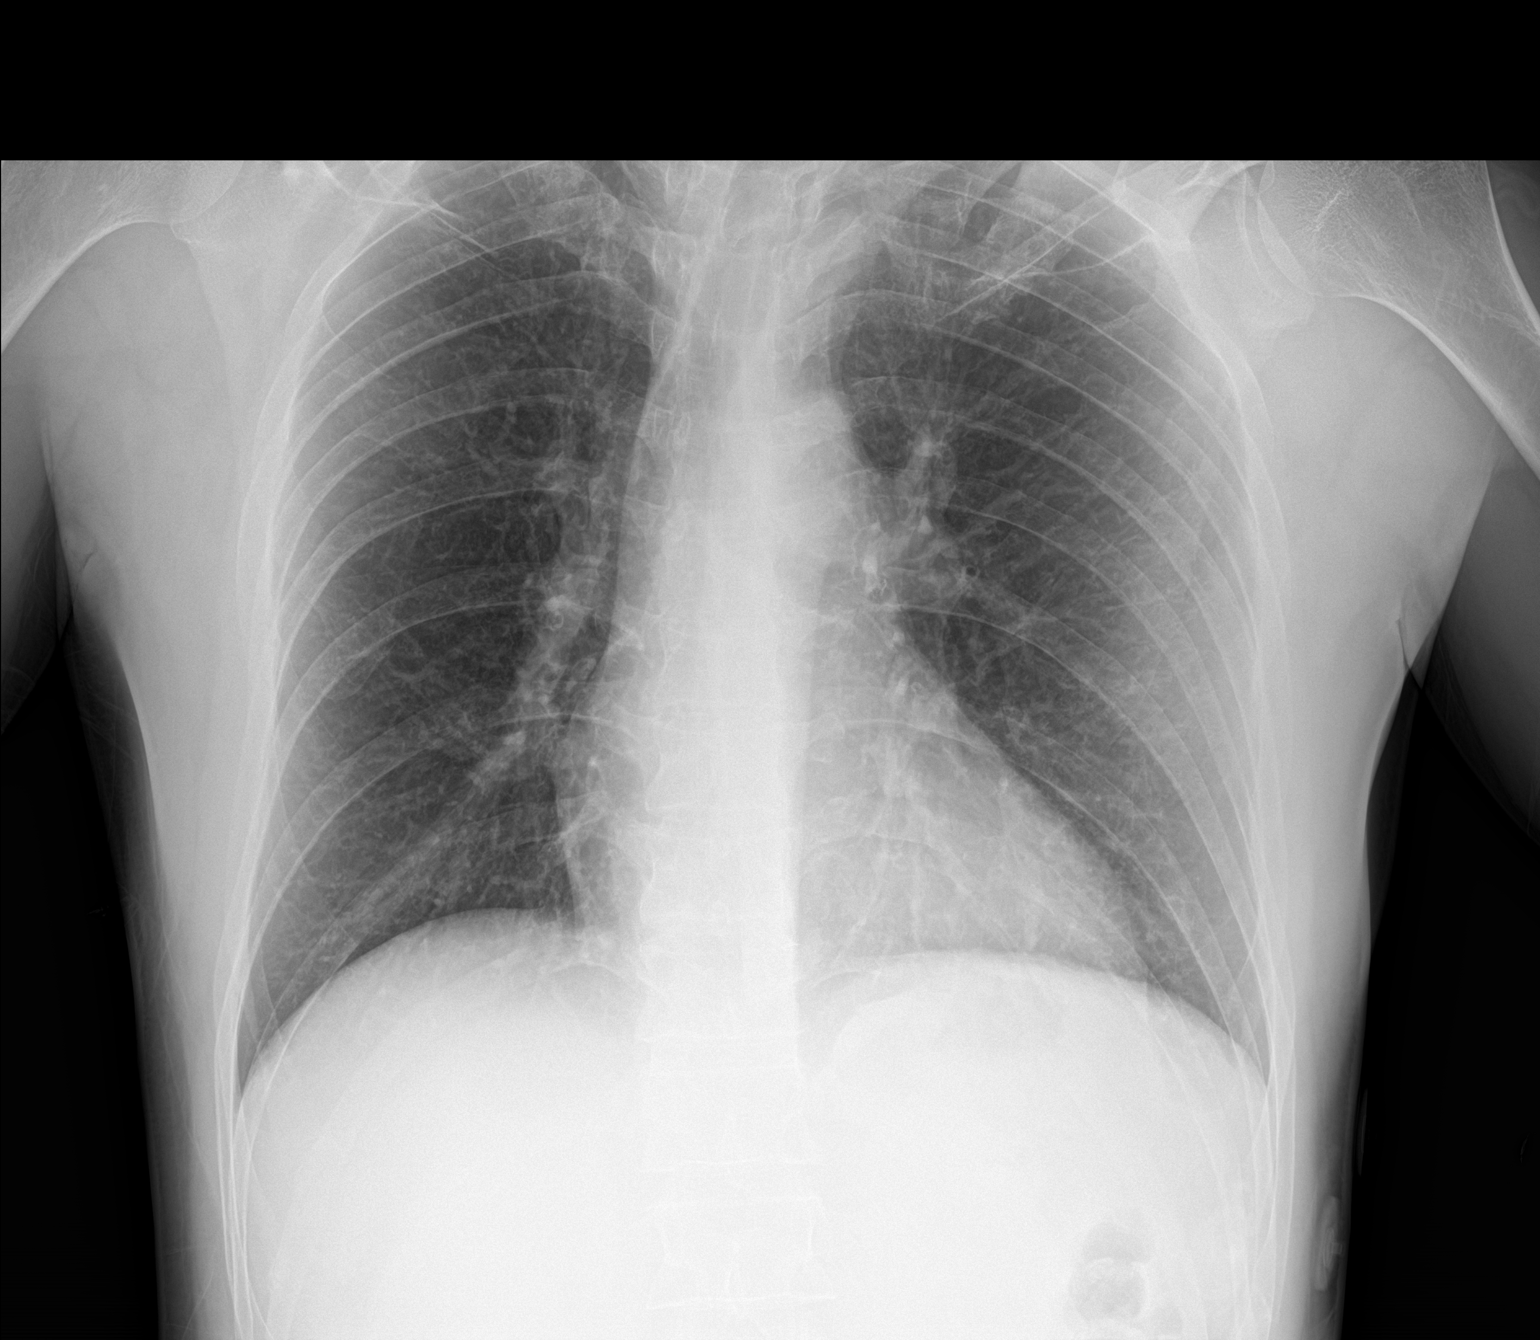

[1 of 1 positions shown; findings below may reference images not displayed]

FINDINGS: The heart size and mediastinal contours are within normal limits.
Both lungs are clear. The visualized skeletal structures are
unremarkable.
IMPRESSION: No active disease.

## 2018-10-03 MED ORDER — SODIUM CHLORIDE 0.9% FLUSH
3.0000 mL | Freq: Once | INTRAVENOUS | Status: AC
  Administered 2018-10-03: 3 mL via INTRAVENOUS

## 2018-10-03 MED ORDER — ACETAMINOPHEN 500 MG PO TABS
1000.0000 mg | ORAL_TABLET | Freq: Once | ORAL | Status: AC
  Administered 2018-10-03: 1000 mg via ORAL
  Filled 2018-10-03: qty 2

## 2018-10-03 MED ORDER — KETOROLAC TROMETHAMINE 60 MG/2ML IM SOLN
15.0000 mg | Freq: Once | INTRAMUSCULAR | Status: AC
  Administered 2018-10-03: 15 mg via INTRAMUSCULAR
  Filled 2018-10-03: qty 2

## 2018-10-03 NOTE — ED Triage Notes (Addendum)
Per arabic interpreter, pt reports cp cough and fever, called pcp and advised to come here to be ruled out covid. Also c/o feeling sob. Denies any known contact. Pt also with cough and sore throat. Hx MI.

## 2018-10-03 NOTE — Progress Notes (Signed)
[facility] Telemedicine Visit  Patient consented to have virtual visit. Method of visit:Video call attempted,but failed   Encounter participants: Patient: Aaron Reyes - located at Home Provider: Janit Pagan - located;Offsite work Others (if applicable): [employer] interpreter [phone]  Chief Complaint: Flu symptoms  HPI: Sore Throat   This is a new problem. The current episode started yesterday. The problem has been unchanged. Maximum temperature: Did not check his temp but felt warm yesterday. The fever has been present for 1 to 2 days. The pain is at a severity of 7/10. The pain is moderate. Associated symptoms include coughing, ear pain and headaches. Pertinent negatives include no abdominal pain, diarrhea, ear discharge, shortness of breath, trouble swallowing or vomiting. Associated symptoms comments: He endorsed associated right ear pain which is mild. Chest feels tight like he is suffocating right now. . He has had no exposure to strep or mono. Exposure to: Denies eposure to someone with confirmed COVID-19 infection. He has tried nothing for the symptoms. The treatment provided no relief.    ROS: per HPI  Pertinent PMHx: Problem list reviewed  Exam:  Respiratory: Does not sound like he is having respiratory distress  Assessment/Plan:  Flu-like symptoms associated with SOB and feeling of suffocation. He does not sound like he is in any respiratory distress. However, he stated that he feels like he is suffocating; hence I recommended ED evaluation. He is advised to wear a face mask on his way to the ED. I called the ED and gave a report to [facility].  As discussed with the patient, he will likely need to stay off work for a few days, and ED will give him more information about self-quarantine. He agreed with the plan and verbalized understanding.  Time spent during visit with patient: 30 minutes

## 2018-10-03 NOTE — Progress Notes (Addendum)
Consult request has been received. CSW attempting to follow up at present time.  CSW spoke to EPD who stated pt needs resources for food as pt has been given medical advice to remain at home and self-quarantine for two weeks.  Per EPD, pt had asked how am I supposed to "feed my kids" via the interpreter?  8:05 PM CSW spoke to pt via interpreter and provided resources, via fa and the RN, to be provided to the pt for [employer], Food Pantries and two [employer] in the areas to assist the pt with further needs.  EDP/RN updated.  CSW called pt back via interpreter.  Pt asked for a two-week note for his employer, allowing the pt to be home from work for two weeks.  CSW spoke to the EDP who stated the pt can be provided with a note for one week, rather than two, which is in alignment with the latest recommendations.  Pt, via telephonic interpreter, was appreciative, voiced understanding and thanked the CSW.   Please reconsult if future social work needs arise.  CSW signing off, as social work intervention is no longer needed.  Dorothe Pea. Micaylah Bertucci, LCSW, LCAS, CSI Transitions of Care Clinical Social Worker Care Coordination Department Ph: [phone]

## 2018-10-03 NOTE — Discharge Instructions (Addendum)
Person Under Monitoring Name: [facility]  Location: [address] [place] [address]   Infection Prevention Recommendations for Individuals Confirmed to have, or Being Evaluated for, 2019 Novel Coronavirus (COVID-19) Infection Who Receive Care at Home  Individuals who are confirmed to have, or are being evaluated for, COVID-19 should follow the prevention steps below until a healthcare provider or local or state health department says they can return to normal activities.  Stay home except to get medical care You should restrict activities outside your home, except for getting medical care. Do not go to work, school, or public areas, and do not use public transportation or taxis.  Call ahead before visiting your doctor Before your medical appointment, call the healthcare provider and tell them that you have, or are being evaluated for, COVID-19 infection. This will help the healthcare providers office take steps to keep other people from getting infected. Ask your healthcare provider to call the local or state health department.  Monitor your symptoms Seek prompt medical attention if your illness is worsening (e.g., difficulty breathing). Before going to your medical appointment, call the healthcare provider and tell them that you have, or are being evaluated for, COVID-19 infection. Ask your healthcare provider to call the local or state health department.  Wear a facemask You should wear a facemask that covers your nose and mouth when you are in the same room with other people and when you visit a healthcare provider. People who live with or visit you should also wear a facemask while they are in the same room with you.  Separate yourself from other people in your home As much as possible, you should stay in a different room from other people in your home. Also, you should use a separate bathroom, if available.  Avoid sharing household items You should not share  dishes, drinking glasses, cups, eating utensils, towels, bedding, or other items with other people in your home. After using these items, you should wash them thoroughly with soap and water.  Cover your coughs and sneezes Cover your mouth and nose with a tissue when you cough or sneeze, or you can cough or sneeze into your sleeve. Throw used tissues in a lined trash can, and immediately wash your hands with soap and water for at least 20 seconds or use an alcohol-based hand rub.  Wash your [employer] your hands often and thoroughly with soap and water for at least 20 seconds. You can use an alcohol-based hand sanitizer if soap and water are not available and if your hands are not visibly dirty. Avoid touching your eyes, nose, and mouth with unwashed hands.   Prevention Steps for Caregivers and Household Members of Individuals Confirmed to have, or Being Evaluated for, COVID-19 Infection Being Cared for in the Home  If you live with, or provide care at home for, a person confirmed to have, or being evaluated for, COVID-19 infection please follow these guidelines to prevent infection:  Follow healthcare providers instructions Make sure that you understand and can help the patient follow any healthcare provider instructions for all care.  Provide for the patients basic needs You should help the patient with basic needs in the home and provide support for getting groceries, prescriptions, and other personal needs.  Monitor the patients symptoms If they are getting sicker, call his or her medical provider and tell them that the patient has, or is being evaluated for, COVID-19 infection. This will help the healthcare providers office  take steps to keep other people from getting infected. °Ask the healthcare provider to call the local or state health department. ° °Limit the number of people who have contact with the patient °If possible, have only one caregiver for the patient. °Other  household members should stay in another home or place of residence. If this is not possible, they should stay °in another room, or be separated from the patient as much as possible. Use a separate bathroom, if available. °Restrict visitors who do not have an essential need to be in the home. ° °Keep older adults, very young children, and other sick people away from the patient °Keep older adults, very young children, and those who have compromised immune systems or chronic health conditions away from the patient. This includes people with chronic heart, lung, or kidney conditions, diabetes, and cancer. ° °Ensure good ventilation °Make sure that shared spaces in the home have good air flow, such as from an air conditioner or an opened window, °weather permitting. ° °Wash your hands often °Wash your hands often and thoroughly with soap and water for at least 20 seconds. You can use an alcohol based hand sanitizer if soap and water are not available and if your hands are not visibly dirty. °Avoid touching your eyes, nose, and mouth with unwashed hands. °Use disposable paper towels to dry your hands. If not available, use dedicated cloth towels and replace them when they become wet. ° °Wear a facemask and gloves °Wear a disposable facemask at all times in the room and gloves when you touch or have contact with the patient’s blood, body fluids, and/or secretions or excretions, such as sweat, saliva, sputum, nasal mucus, vomit, urine, or feces.  Ensure the mask fits over your nose and mouth tightly, and do not touch it during use. °Throw out disposable facemasks and gloves after using them. Do not reuse. °Wash your hands immediately after removing your facemask and gloves. °If your personal clothing becomes contaminated, carefully remove clothing and launder. Wash your hands after handling contaminated clothing. °Place all used disposable facemasks, gloves, and other waste in a lined container before disposing them with  other household waste. °Remove gloves and wash your hands immediately after handling these items. ° °Do not share dishes, glasses, or other household items with the patient °Avoid sharing household items. You should not share dishes, drinking glasses, cups, eating utensils, towels, bedding, or other items with a patient who is confirmed to have, or being evaluated for, COVID-19 infection. °After the person uses these items, you should wash them thoroughly with soap and water. ° °Wash laundry thoroughly °Immediately remove and wash clothes or bedding that have blood, body fluids, and/or secretions or excretions, such as sweat, saliva, sputum, nasal mucus, vomit, urine, or feces, on them. °Wear gloves when handling laundry from the patient. °Read and follow directions on labels of laundry or clothing items and detergent. In general, wash and dry with the warmest temperatures recommended on the label. ° °Clean all areas the individual has used often °Clean all touchable surfaces, such as counters, tabletops, doorknobs, bathroom fixtures, toilets, phones, keyboards, tablets, and bedside tables, every day. Also, clean any surfaces that may have blood, body fluids, and/or secretions or excretions on them. °Wear gloves when cleaning surfaces the patient has come in contact with. °Use a diluted bleach solution (e.g., dilute bleach with 1 part bleach and 10 parts water) or a household disinfectant with a label that says EPA-registered for coronaviruses. To make a bleach   solution at home, add 1 tablespoon of bleach to 1 quart (4 cups) of water. For a larger supply, add  cup of bleach to 1 gallon (16 cups) of water. Read labels of cleaning products and follow recommendations provided on product labels. Labels contain instructions for safe and effective use of the cleaning product including precautions you should take when applying the product, such as wearing gloves or eye protection and making sure you have good ventilation  during use of the product. Remove gloves and wash hands immediately after cleaning.  Monitor yourself for signs and symptoms of illness Caregivers and household members are considered close contacts, should monitor their health, and will be asked to limit movement outside of the home to the extent possible. Follow the monitoring steps for close contacts listed on the symptom monitoring form.   ? If you have additional questions, contact your local health department or call the epidemiologist on call at [phone] (available 24/7). ? This guidance is subject to change. For the most up-to-date guidance from [facility], please refer to their website: [email or web address]

## 2018-10-03 NOTE — ED Provider Notes (Signed)
[facility] EMERGENCY DEPARTMENT Provider Note   CSN: [id number] Arrival date & time: 10/03/18  1509    History   Chief Complaint Chief Complaint  Patient presents with  . Chest Pain    HPI Aaron Reyes is a 43 y.o. male.     43 yo M with a chief complaint of chest pain cough and fever.  This been going on for the past 2 or 3 days.  He called his family physician on the phone today and they sent him to the ED for evaluation for the novel coronavirus.  He denies sick contacts.  Has been having a headache and sore throat as well.  Nonproductive cough.  Subjective fevers and chills.  No recent travel.  The history is provided by the patient.  Chest Pain  Pain location:  L chest Pain quality: dull   Pain radiates to:  Does not radiate Pain severity:  Moderate Onset quality:  Gradual Duration:  2 days Timing:  Constant Progression:  Worsening Chronicity:  New Relieved by:  Nothing Worsened by:  Nothing Associated symptoms: cough, fever and shortness of breath   Associated symptoms: no abdominal pain, no headache, no palpitations and no vomiting     Past Medical History:  Diagnosis Date  . GERD (gastroesophageal reflux disease)   . Kidney stone   . Myocardial infarct ([place])   . Non-cardiac chest pain   . S/P cardiac catheterization 03/2015   cath in [place] and [place], no records,  cath at [facility] normal coronary arteries and normal LV function.    Patient Active Problem List   Diagnosis Date Noted  . Cough 06/28/2018  . Other abnormal glucose 06/28/2018  . Prediabetes 06/27/2018  . Closed fracture of body of sternum 06/09/2018  . Anxiety   . Chest pain 01/01/2018  . Dyspnea 01/01/2018  . Tobacco abuse 01/01/2018  . Dizziness 01/01/2018  . Influenza-like illness 07/04/2017  . Arthralgia of both lower legs 02/21/2017  . Bilateral sensorineural hearing loss 02/07/2017  . Cervicalgia 10/19/2016  . GERD (gastroesophageal reflux disease)  09/04/2016  . Generalized anxiety disorder 01/26/2016  . Post traumatic stress disorder 01/26/2016  . 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 Back pain 04/26/2015  . Adjustment disorder with anxious mood 04/26/2015  . 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 Dysuria 04/26/2015  . Refugee health examination 03/18/2015  . Housing problems 03/18/2015  . Testicular mass 03/18/2015  . Inguinal hernia 03/18/2015  . Chest pain 02/15/2015  . Hypokalemia 02/15/2015    Past Surgical History:  Procedure Laterality Date  . CARDIAC CATHETERIZATION    . CARDIAC CATHETERIZATION     x 2  . CARDIAC CATHETERIZATION N/A 04/11/2015   Procedure: Left Heart Cath and Coronary Angiography;  Surgeon: Belva Crome, MD;  Location: [facility];  Service: Cardiovascular;  Laterality: N/A;  . HERNIA REPAIR          Home Medications    Prior to Admission medications   Medication Sig Start Date End Date Taking? Authorizing Provider  aspirin EC 81 MG tablet Take 1 tablet (81 mg total) by mouth daily. 04/28/15  Yes Isaiah Serge, NP  metFORMIN (GLUCOPHAGE) 500 MG tablet Take 1 tablet (500 mg total) by mouth daily with breakfast. 06/27/18 10/03/18 Yes Bland, Scott, DO  OVER THE COUNTER MEDICATION Take 1 tablet by mouth daily as needed (pain/sore throat). otc - medication for pain/sore throat   Yes [provider]  pantoprazole (PROTONIX) 40  MG tablet TAKE 1 TABLET BY MOUTH EVERY DAY Patient taking differently: Take 40 mg by mouth daily.  12/02/17  Yes Mercy Riding, MD  amitriptyline (ELAVIL) 50 MG tablet Take 0.5 tablets (25 mg total) by mouth at bedtime. 09/02/17   Mercy Riding, MD  benzonatate (TESSALON) 100 MG capsule Take 1 capsule (100 mg total) by mouth 2 (two) times daily as needed for cough. Patient not taking: Reported on 10/03/2018 06/27/18   Sherene Sires, DO  blood glucose meter kit and supplies KIT Dispense based on patient and insurance preference. Use up to four times daily as  directed. (FOR ICD-9 250.00, 250.01). 07/14/18   Martyn Malay, MD  busPIRone (BUSPAR) 15 MG tablet Take 1 tablet (15 mg total) by mouth 3 (three) times daily. 12/10/16   Scot Jun, FNP  escitalopram (LEXAPRO) 20 MG tablet Take 1 tablet (20 mg total) by mouth at bedtime. 12/10/16   Scot Jun, FNP  famotidine (PEPCID) 20 MG tablet Take 1 tablet (20 mg total) by mouth 2 (two) times daily. 01/14/18   Sherene Sires, DO  ibuprofen (ADVIL,MOTRIN) 800 MG tablet Take 1 tablet (800 mg total) by mouth every 8 (eight) hours as needed for up to 20 doses. Patient not taking: Reported on 10/03/2018 06/03/18   Lennice Sites, DO  meclizine (ANTIVERT) 25 MG tablet Take 1 tablet (25 mg total) by mouth 2 (two) times daily as needed for dizziness. Patient not taking: Reported on 10/03/2018 01/03/18   Matilde Haymaker, MD  meloxicam (MOBIC) 15 MG tablet Take 1 tablet (15 mg total) by mouth daily. Patient not taking: Reported on 10/03/2018 06/09/18   Guadalupe Dawn, MD  nitroGLYCERIN (NITROSTAT) 0.4 MG SL tablet Place 1 tablet (0.4 mg total) under the tongue every 5 (five) minutes as needed for chest pain. Patient not taking: Reported on 10/03/2018 04/06/15   Isaiah Serge, NP  PRESCRIPTION MEDICATION     [provider]  rosuvastatin (CRESTOR) 20 MG tablet Take 1 tablet (20 mg total) by mouth daily. Patient not taking: Reported on 10/03/2018 01/03/18   Matilde Haymaker, MD  traZODone (DESYREL) 100 MG tablet Take 2 tablets (200 mg total) by mouth at bedtime. 12/10/16   Scot Jun, FNP    Family History Family History  Problem Relation Age of Onset  . CAD Father   . Heart attack Father   . Hypertension Father   . Stroke Father   . Cancer Mother   . Diabetes Mother   . Hypertension Mother     Social History Social History   Tobacco Use  . Smoking status: Current Every Day Smoker    Packs/day: 0.25    Types: Cigarettes  . Smokeless tobacco: Never Used  Substance Use Topics  .  Alcohol use: No  . Drug use: No     Allergies   Pork-derived products   Review of Systems Review of Systems  Constitutional: Positive for chills and fever.  HENT: Negative for congestion and facial swelling.   Eyes: Negative for discharge and visual disturbance.  Respiratory: Positive for cough and shortness of breath.   Cardiovascular: Positive for chest pain. Negative for palpitations.  Gastrointestinal: Negative for abdominal pain, diarrhea and vomiting.  Musculoskeletal: Negative for arthralgias and myalgias.  Skin: Negative for color change and rash.  Neurological: Negative for tremors, syncope and headaches.  Psychiatric/Behavioral: Negative for confusion and dysphoric mood.     Physical Exam Updated Vital Signs BP 136/88 (BP Location: Right Arm)  Pulse 86   Temp 98.5 F (36.9 C) (Oral)   Resp 18   Ht 5' 4.96" (1.65 m)   Wt 67 kg   SpO2 97%   BMI 24.61 kg/m   Physical Exam Vitals signs and nursing note reviewed.  Constitutional:      Appearance: He is well-developed.  HENT:     Head: Normocephalic and atraumatic.     Comments: Swollen turbinates, posterior nasal drip, no noted sinus ttp, tm normal bilaterally.   Eyes:     Pupils: Pupils are equal, round, and reactive to light.  Neck:     Musculoskeletal: Normal range of motion and neck supple.     Vascular: No JVD.  Cardiovascular:     Rate and Rhythm: Normal rate and regular rhythm.     Heart sounds: No murmur. No friction rub. No gallop.   Pulmonary:     Effort: No respiratory distress.     Breath sounds: No wheezing.  Chest:     Chest wall: Tenderness present.     Comments: Palpation of the chest wall reproduces the patient's pain. Abdominal:     General: There is no distension.     Tenderness: There is no guarding or rebound.  Musculoskeletal: Normal range of motion.  Skin:    Coloration: Skin is not pale.     Findings: No rash.  Neurological:     Mental Status: He is alert and oriented to  person, place, and time.  Psychiatric:        Behavior: Behavior normal.      ED Treatments / Results  Labs (all labs ordered are listed, but only abnormal results are displayed) Labs Reviewed  BASIC METABOLIC PANEL - Abnormal; Notable for the following components:      Result Value   Glucose, Bld 128 (*)    All other components within normal limits  CBC  TROPONIN I    EKG EKG Interpretation  Date/Time:  Friday October 03 2018 16:00:17 EDT Ventricular Rate:  77 PR Interval:  130 QRS Duration: 92 QT Interval:  350 QTC Calculation: 396 R Axis:   74 Text Interpretation:  Normal sinus rhythm Minimal voltage criteria for LVH, may be normal variant T wave abnormality, consider inferolateral ischemia Abnormal ECG No significant change since last tracing Confirmed by Deno Etienne [phone]) on 10/03/2018 4:03:43 PM   Radiology Dg Chest Port 1 View  Result Date: 10/03/2018 CLINICAL DATA:  Cough and chest pain EXAM: PORTABLE CHEST 1 VIEW COMPARISON:  06/03/2018 FINDINGS: The heart size and mediastinal contours are within normal limits. Both lungs are clear. The visualized skeletal structures are unremarkable. IMPRESSION: No active disease. Electronically Signed   By: Franchot Gallo M.D.   On: 10/03/2018 18:41    Procedures Procedures (including critical care time)  Medications Ordered in ED Medications  sodium chloride flush (NS) 0.9 % injection 3 mL (3 mLs Intravenous Given 10/03/18 1808)  acetaminophen (TYLENOL) tablet 1,000 mg (1,000 mg Oral Given 10/03/18 1806)  ketorolac (TORADOL) injection 15 mg (15 mg Intramuscular Given 10/03/18 1805)     Initial Impression / Assessment and Plan / ED Course  I have reviewed the triage vital signs and the nursing notes.  Pertinent labs & imaging results that were available during my care of the patient were reviewed by me and considered in my medical decision making (see chart for details).        43 yo M with a chief complaint of an  influenza-like illness and chest  pain.  Chest pain is worse with coughing.  Reproducible on exam.  I suspect that this is musculoskeletal.  Will obtain lab work chest x-ray.  Patient is well-appearing nontoxic not tachypneic not requiring oxygen.  With the recent coronavirus outbreak we will have him self isolate for 14 days.  Aaron Reyes was evaluated in Emergency Department on 10/03/2018 for the symptoms described in the history of present illness. He/she was evaluated in the context of the global COVID-19 pandemic, which necessitated consideration that the patient might be at risk for infection with the SARS-CoV-2 virus that causes COVID-19. Institutional protocols and algorithms that pertain to the evaluation of patients at risk for COVID-19 are in a state of rapid change based on information released by regulatory bodies including the CDC and federal and state organizations. These policies and algorithms were followed during the patient's care in the ED.  6:55 PM:  I have discussed the diagnosis/risks/treatment options with the patient and believe the pt to be eligible for discharge home to follow-up with PCP. We also discussed returning to the ED immediately if new or worsening sx occur. We discussed the sx which are most concerning (e.g., sudden worsening sob, fever, inability to tolerate by mouth) that necessitate immediate return. Medications administered to the patient during their visit and any new prescriptions provided to the patient are listed below.  Medications given during this visit Medications  sodium chloride flush (NS) 0.9 % injection 3 mL (3 mLs Intravenous Given 10/03/18 1808)  acetaminophen (TYLENOL) tablet 1,000 mg (1,000 mg Oral Given 10/03/18 1806)  ketorolac (TORADOL) injection 15 mg (15 mg Intramuscular Given 10/03/18 1805)     The patient appears reasonably screen and/or stabilized for discharge and I doubt any other medical condition or other [facility] requiring further  screening, evaluation, or treatment in the ED at this time prior to discharge.     Final Clinical Impressions(s) / ED Diagnoses   Final diagnoses:  Suspected Covid-19 Virus Infection    ED Discharge Orders    None       Deno Etienne, DO 10/03/18 1855

## 2018-10-06 ENCOUNTER — Telehealth: Payer: Self-pay | Admitting: *Deleted

## 2018-10-06 ENCOUNTER — Other Ambulatory Visit: Payer: Self-pay | Admitting: Family Medicine

## 2018-10-06 DIAGNOSIS — R05 Cough: Secondary | ICD-10-CM

## 2018-10-06 DIAGNOSIS — S2222XD Fracture of body of sternum, subsequent encounter for fracture with routine healing: Secondary | ICD-10-CM

## 2018-10-06 DIAGNOSIS — R059 Cough, unspecified: Secondary | ICD-10-CM

## 2018-10-06 NOTE — Telephone Encounter (Signed)
Rx request for ACCU-CHECK guide test strips. Please advise. Caymen Dubray Bruna Potter, CMA

## 2018-10-06 NOTE — Telephone Encounter (Signed)
Also ACCU-CHEK guide meter kit. Paytin Ramakrishnan Kennon Holter, CMA

## 2018-10-06 NOTE — Telephone Encounter (Signed)
rx request for ACCU-CHECK fastclix lancest 102's. Bensen Chadderdon Bruna Potter, CMA

## 2018-10-09 NOTE — Telephone Encounter (Signed)
When it comes through fax we make an encounter and send it to you and if its not on med list we send it through a message. Orva Riles Bruna Potter, CMA

## 2018-10-09 NOTE — Telephone Encounter (Signed)
I didn't see them either, that's why I sent a message. They came in through fax. Deseree Bruna Potter, CMA

## 2018-10-16 ENCOUNTER — Other Ambulatory Visit: Payer: Self-pay

## 2018-10-16 ENCOUNTER — Other Ambulatory Visit: Payer: Self-pay | Admitting: Family Medicine

## 2018-10-16 DIAGNOSIS — K219 Gastro-esophageal reflux disease without esophagitis: Secondary | ICD-10-CM

## 2018-10-27 ENCOUNTER — Telehealth: Payer: Self-pay | Admitting: *Deleted

## 2018-10-27 NOTE — Telephone Encounter (Signed)
Pt lm on nurse line asking for return call.  I think he was requesting a return to work date or note.     Returned call and lmovm for callback. Jone Baseman, CMA

## 2018-10-28 ENCOUNTER — Encounter: Payer: Self-pay | Admitting: Family Medicine

## 2018-10-28 ENCOUNTER — Ambulatory Visit (INDEPENDENT_AMBULATORY_CARE_PROVIDER_SITE_OTHER): Payer: Medicaid Other | Admitting: Family Medicine

## 2018-10-28 ENCOUNTER — Other Ambulatory Visit: Payer: Self-pay

## 2018-10-28 VITALS — BP 110/70 | HR 65

## 2018-10-28 DIAGNOSIS — R0789 Other chest pain: Secondary | ICD-10-CM | POA: Diagnosis not present

## 2018-10-28 DIAGNOSIS — J069 Acute upper respiratory infection, unspecified: Secondary | ICD-10-CM | POA: Diagnosis present

## 2018-10-28 NOTE — Patient Instructions (Signed)
Thank you for coming to see me today. It was a pleasure. Today we talked about:   Your symptoms.  I have written you a note for 1 week.  If you worsen, chest pain because more frequent or changes in character, you are unable to speak in full sentences due to shortness of breath, you should be seen right away.  Please follow-up in 1 week.  If you have any questions or concerns, please do not hesitate to call the office at [phone].  Best,   Luis Abed, DO

## 2018-10-28 NOTE — Assessment & Plan Note (Signed)
Low suspicion for COVID.  Patient continues to endorse symptoms, but reassured that they are improving and physical exam is benign.  Patient able to speak in full sentences and walking in clinic without respiratory distress.  No subjective fevers x 1 week.  Wrote patient note for 1 week, will have come back in 1 week for f/u appointment to assess if he needs to be out of work for another week.  Return precautions discussed, he voiced understanding. - return 1 week - given note for work through 5/18 - return precautions discussed

## 2018-10-28 NOTE — Progress Notes (Addendum)
     Subjective: No chief complaint on file.  Ipad interpretor used for visit.  HPI: Aaron Reyes is a 43 y.o. presenting to clinic today to discuss the following:  1 Viral Illness  Patient presents after going to ED 4/17 with chest pain, shortness of breath, and cough.  There was low suspicion for COVID at the time from ED provider and patient was not tested, but had BMP, CBC, CXR, EKG, trop, all of which were WNL.  Patient has been self-quarantining since that time.  He notes that he has never had a measured fever, but had subjective fevers, with the last being 7 days ago.  He states that overall he is feeling better, but "this is a very slow process."  He notes chest pain has improved, shortness of breath has improved, but still occurs mostly at rest, occasionally while sitting, and he continues to have a dry cough.  He states that chest pain has improved from ED visit but has not changed in nature and is worsened with cough.  Patient is coming in requesting to be out of work for another 14 days.      ROS noted in HPI.   Past Medical, Surgical, Social, and Family History Reviewed & Updated per EMR.   Pertinent Historical Findings include:   Social History   Tobacco Use  Smoking Status Current Every Day Smoker  . Packs/day: 0.25  . Types: Cigarettes  Smokeless Tobacco Never Used      Objective: BP 110/70   Pulse 65  Vitals and nursing notes reviewed  Physical Exam:  General: 43 y.o. male in NAD Cardio: RRR no m/r/g Lungs: CTAB, no wheezing, no rhonchi, no crackles, no IWOB on RA, speaking in full sentences Abdomen: Soft, non-tender to palpation, non-distended, positive bowel sounds Skin: warm and dry Extremities: No edema   No results found for this or any previous visit (from the past 72 hour(s)).  Assessment/Plan:  Viral URI Low suspicion for COVID.  Patient continues to endorse symptoms, but reassured that they are improving and physical exam is benign.   Patient able to speak in full sentences and walking in clinic without respiratory distress.  No subjective fevers x 1 week.  Wrote patient note for 1 week, will have come back in 1 week for f/u appointment to assess if he needs to be out of work for another week.  Return precautions discussed, he voiced understanding. - return 1 week - given note for work through 5/18 - return precautions discussed   Atypical chest pain Likely MSK 2/2 cough.  Improving.  Prior trop and EKG WNL, therefore low suspicion for ACS.  Patient advised to return to care if pain changes in nature, worsens, or is unable to speak in full sentences due to shortness of breath.     PATIENT EDUCATION PROVIDED: See AVS    Diagnosis and plan along with any newly prescribed medication(s) were discussed in detail with this patient today. The patient verbalized understanding and agreed with the plan. Patient advised if symptoms worsen return to clinic or ER.   Health Maintainance:   No orders of the defined types were placed in this encounter.   No orders of the defined types were placed in this encounter.    Luis Abed, DO 10/28/2018, 3:13 PM PGY-1 [facility]

## 2018-10-28 NOTE — Assessment & Plan Note (Signed)
Likely MSK 2/2 cough.  Improving.  Prior trop and EKG WNL, therefore low suspicion for ACS.  Patient advised to return to care if pain changes in nature, worsens, or is unable to speak in full sentences due to shortness of breath.

## 2018-10-31 ENCOUNTER — Ambulatory Visit: Payer: Medicaid Other | Admitting: Family Medicine

## 2018-11-03 ENCOUNTER — Encounter: Payer: Self-pay | Admitting: Family Medicine

## 2018-11-03 ENCOUNTER — Other Ambulatory Visit: Payer: Self-pay

## 2018-11-03 ENCOUNTER — Ambulatory Visit (HOSPITAL_COMMUNITY)
Admission: RE | Admit: 2018-11-03 | Discharge: 2018-11-03 | Disposition: A | Discharge status: Home / Self Care | Payer: Medicaid Other | Source: Ambulatory Visit | Attending: Family Medicine | Admitting: Family Medicine

## 2018-11-03 ENCOUNTER — Ambulatory Visit: Payer: Medicaid Other | Admitting: Family Medicine

## 2018-11-03 DIAGNOSIS — R7309 Other abnormal glucose: Secondary | ICD-10-CM | POA: Diagnosis present

## 2018-11-03 DIAGNOSIS — F411 Generalized anxiety disorder: Secondary | ICD-10-CM

## 2018-11-03 DIAGNOSIS — R7303 Prediabetes: Secondary | ICD-10-CM | POA: Diagnosis not present

## 2018-11-03 DIAGNOSIS — R0789 Other chest pain: Secondary | ICD-10-CM | POA: Insufficient documentation

## 2018-11-03 LAB — POCT GLYCOSYLATED HEMOGLOBIN (HGB A1C): HbA1c, POC (controlled diabetic range): 6.1 % (ref 0.0–7.0)

## 2018-11-03 MED ORDER — NITROGLYCERIN 0.4 MG SL SUBL: 0.4000 mg | SUBLINGUAL_TABLET | SUBLINGUAL | 3 refills | Status: DC | PRN

## 2018-11-03 NOTE — Progress Notes (Signed)
    Subjective:  Aaron Reyes is a 43 y.o. male who presents to the [facility] today with a chief complaint of atypical chest pain.   HPI: Patient with significant history of reports of atypical chest pain.  He has had cardiac work-up multiple times.  These events are always timed with moments of motion, accompanied with sensation of shortness of breath.  They do not always respond to nitro general respond to the situation calming down.  There are no symptoms currently in the office.  He does have significantly difficult social history and anxiety depression which she has a history of occasional noncompliance with medicine and therapy.  He acknowledges on the conscious level of anxiety is likely a factor to this but often seeks reassurance  Objective:  Physical Exam: There were no vitals taken for this visit.  Normal respiratory rate, normal heart rate, no murmur found Gen: NAD, anxious when discussing his symptoms but in no acute distress CV: RRR with no murmurs appreciated Pulm: NWOB, CTAB with no crackles, wheezes, or rhonchi GI: Normal bowel sounds present. Soft, Nontender, Nondistended. MSK: no edema, cyanosis, or clubbing noted Skin: warm, dry Neuro: grossly normal, moves all extremities Psych: Normal affect and thought content  No results found for this or any previous visit (from the past 72 hour(s)).   Assessment/Plan:  Prediabetes A1c 6.1.  This is consistent with prior.  Patient has been tolerating his medication well.  Chest pain Chest pain is atypical, likely anxiety related.  We did do an ECG as insurance for the patient which was reviewed and non-concerning.  Patient was placed   Marthenia Rolling, DO FAMILY MEDICINE RESIDENT - PGY2 11/17/2018 9:35 AM

## 2018-11-17 NOTE — Assessment & Plan Note (Signed)
A1c 6.1.  This is consistent with prior.  Patient has been tolerating his medication well.

## 2018-11-17 NOTE — Assessment & Plan Note (Signed)
Chest pain is atypical, likely anxiety related.  We did do an ECG as insurance for the patient which was reviewed and non-concerning.  Patient was placed

## 2018-11-30 ENCOUNTER — Emergency Department (HOSPITAL_COMMUNITY)
Admission: EM | Admit: 2018-11-30 | Discharge: 2018-11-30 | Disposition: A | Discharge status: Home / Self Care | Payer: Medicaid Other | Attending: Emergency Medicine | Admitting: Emergency Medicine

## 2018-11-30 ENCOUNTER — Encounter (HOSPITAL_COMMUNITY): Payer: Self-pay | Admitting: *Deleted

## 2018-11-30 ENCOUNTER — Other Ambulatory Visit: Payer: Self-pay

## 2018-11-30 ENCOUNTER — Emergency Department (HOSPITAL_COMMUNITY): Payer: Medicaid Other

## 2018-11-30 DIAGNOSIS — S1081XA Abrasion of other specified part of neck, initial encounter: Secondary | ICD-10-CM | POA: Insufficient documentation

## 2018-11-30 DIAGNOSIS — Y929 Unspecified place or not applicable: Secondary | ICD-10-CM | POA: Diagnosis not present

## 2018-11-30 DIAGNOSIS — T07XXXA Unspecified multiple injuries, initial encounter: Secondary | ICD-10-CM | POA: Diagnosis present

## 2018-11-30 DIAGNOSIS — Y939 Activity, unspecified: Secondary | ICD-10-CM | POA: Diagnosis not present

## 2018-11-30 DIAGNOSIS — I252 Old myocardial infarction: Secondary | ICD-10-CM | POA: Insufficient documentation

## 2018-11-30 DIAGNOSIS — Z79899 Other long term (current) drug therapy: Secondary | ICD-10-CM | POA: Diagnosis not present

## 2018-11-30 DIAGNOSIS — S40812A Abrasion of left upper arm, initial encounter: Secondary | ICD-10-CM | POA: Diagnosis not present

## 2018-11-30 DIAGNOSIS — Y999 Unspecified external cause status: Secondary | ICD-10-CM | POA: Insufficient documentation

## 2018-11-30 DIAGNOSIS — F419 Anxiety disorder, unspecified: Secondary | ICD-10-CM | POA: Diagnosis not present

## 2018-11-30 DIAGNOSIS — F17210 Nicotine dependence, cigarettes, uncomplicated: Secondary | ICD-10-CM | POA: Diagnosis not present

## 2018-11-30 DIAGNOSIS — Z7982 Long term (current) use of aspirin: Secondary | ICD-10-CM | POA: Diagnosis not present

## 2018-11-30 LAB — CBG MONITORING, ED: Glucose-Capillary: 231 mg/dL — ABNORMAL HIGH (ref 70–99)
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
## 2018-11-30 NOTE — ED Notes (Signed)
Pt hyperventilating and anxious in triage, encouraged to slow is breathing.
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
## 2018-12-02 ENCOUNTER — Other Ambulatory Visit: Payer: Self-pay | Admitting: Family Medicine

## 2018-12-02 DIAGNOSIS — R7303 Prediabetes: Secondary | ICD-10-CM

## 2018-12-04 ENCOUNTER — Telehealth: Payer: Medicaid Other | Admitting: Family Medicine

## 2018-12-04 ENCOUNTER — Other Ambulatory Visit: Payer: Self-pay

## 2018-12-04 ENCOUNTER — Ambulatory Visit: Payer: Medicaid Other | Admitting: Student in an Organized Health Care Education/Training Program

## 2018-12-05 ENCOUNTER — Other Ambulatory Visit: Payer: Self-pay

## 2018-12-05 ENCOUNTER — Encounter: Payer: Self-pay | Admitting: Student in an Organized Health Care Education/Training Program

## 2018-12-05 ENCOUNTER — Ambulatory Visit (INDEPENDENT_AMBULATORY_CARE_PROVIDER_SITE_OTHER): Payer: Medicaid Other | Admitting: Student in an Organized Health Care Education/Training Program

## 2018-12-05 DIAGNOSIS — R7303 Prediabetes: Secondary | ICD-10-CM

## 2018-12-05 DIAGNOSIS — K219 Gastro-esophageal reflux disease without esophagitis: Secondary | ICD-10-CM | POA: Diagnosis not present

## 2018-12-05 MED ORDER — PANTOPRAZOLE SODIUM 40 MG PO TBEC: 40.0000 mg | DELAYED_RELEASE_TABLET | Freq: Every day | ORAL | 0 refills | Status: DC

## 2018-12-05 MED ORDER — METFORMIN HCL 500 MG PO TABS: ORAL_TABLET | ORAL | 0 refills | Status: DC
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
## 2018-12-05 NOTE — Patient Instructions (Addendum)
It was a pleasure seeing you today in our clinic.   Please take your prilosec every day.  Please avoid triggers for your reflux.  Our clinic's number is [phone]. Please call with questions or concerns about what we discussed today.  Be well, Dr. Burr Medico

## 2018-12-08 NOTE — Assessment & Plan Note (Signed)
I suspect that acute on chronic stress is contributing to patient's elevated blood sugars and likely worsening his symptoms from chronic GERD. Discussed lifestyle measures including avoidance of food triggers for reflux.  - refilled PPI - patient may take PPI daily for the next 2 weeks - follow up in [place] clinic in 2 weeks for continued management and support of his acute stress

## 2018-12-23 ENCOUNTER — Ambulatory Visit: Payer: Medicaid Other | Admitting: Family Medicine

## 2018-12-23 ENCOUNTER — Other Ambulatory Visit: Payer: Self-pay

## 2018-12-23 VITALS — BP 110/80 | HR 94 | Temp 98.6°F | Wt 143.0 lb

## 2018-12-23 DIAGNOSIS — F411 Generalized anxiety disorder: Secondary | ICD-10-CM

## 2018-12-23 DIAGNOSIS — F419 Anxiety disorder, unspecified: Secondary | ICD-10-CM

## 2018-12-23 DIAGNOSIS — F4310 Post-traumatic stress disorder, unspecified: Secondary | ICD-10-CM

## 2018-12-23 DIAGNOSIS — Z599 Problem related to housing and economic circumstances, unspecified: Secondary | ICD-10-CM
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
## 2018-12-24 ENCOUNTER — Encounter: Payer: Self-pay | Admitting: Family Medicine
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
## 2018-12-31 ENCOUNTER — Other Ambulatory Visit: Payer: Self-pay

## 2018-12-31 ENCOUNTER — Ambulatory Visit (INDEPENDENT_AMBULATORY_CARE_PROVIDER_SITE_OTHER): Payer: Medicaid Other | Admitting: Family Medicine

## 2018-12-31 ENCOUNTER — Encounter: Payer: Self-pay | Admitting: Family Medicine

## 2018-12-31 VITALS — BP 102/60 | HR 87

## 2018-12-31 DIAGNOSIS — Z594 Lack of adequate food and safe drinking water: Secondary | ICD-10-CM

## 2018-12-31 DIAGNOSIS — Z789 Other specified health status: Secondary | ICD-10-CM | POA: Diagnosis not present

## 2018-12-31 DIAGNOSIS — H905 Unspecified sensorineural hearing loss: Secondary | ICD-10-CM

## 2018-12-31 DIAGNOSIS — R51 Headache: Secondary | ICD-10-CM

## 2018-12-31 DIAGNOSIS — R519 Headache, unspecified: Secondary | ICD-10-CM

## 2018-12-31 DIAGNOSIS — Z5941 Food insecurity: Secondary | ICD-10-CM

## 2018-12-31 MED ORDER — DICLOFENAC SODIUM 1 % TD GEL: 4.0000 g | Freq: Four times a day (QID) | TRANSDERMAL | 0 refills | Status: DC

## 2018-12-31 MED ORDER — INDOMETHACIN ER 75 MG PO CPCR: 75.0000 mg | ORAL_CAPSULE | Freq: Two times a day (BID) | ORAL | 0 refills | Status: AC

## 2018-12-31 NOTE — Patient Instructions (Addendum)
Dear Aaron Reyes [facility],   It was good to see you! Thank you for taking your time to come in to be seen. Today, we discussed the following:   Diclofenac for 7 days. Take on pill 2-3 times a day Apply gel to affected area up to four time a day for pain relief.   Future Appointments  Date Time Provider [place]  01/26/2019  3:10 PM Wilber Oliphant, MD [facility]    Be well,   Zettie Cooley, M.D   [place] [phone]  *Sign up for MyChart for instant access to your health profile, labs, orders, upcoming appointments or to contact your provider with questions*  ========================================================================
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
## 2019-01-02 ENCOUNTER — Encounter: Payer: Self-pay | Admitting: Family Medicine

## 2019-01-02 DIAGNOSIS — R519 Headache, unspecified: Secondary | ICD-10-CM | POA: Insufficient documentation

## 2019-01-02 DIAGNOSIS — Z5941 Food insecurity: Secondary | ICD-10-CM

## 2019-01-02 DIAGNOSIS — Z789 Other specified health status: Secondary | ICD-10-CM | POA: Insufficient documentation

## 2019-01-02 DIAGNOSIS — Z594 Lack of adequate food and safe drinking water: Secondary | ICD-10-CM | POA: Insufficient documentation

## 2019-01-02 HISTORY — DX: Food insecurity: Z59.41

## 2019-01-02 NOTE — Assessment & Plan Note (Addendum)
Patient reports that DSS has approved family for extra income, but the parents must go to school to learn [place]. Pt and wife would like me to write a letter on their behalves to state that pt cannot leave home due to his wife's psychologic issues.   Will do thorough review of chart while family waits for paperwork in the mail  Will need more information to make a decision on whether or not I can help or if patients do not qualify

## 2019-01-02 NOTE — Assessment & Plan Note (Signed)
Telephone interpretor on todays exam

## 2019-01-02 NOTE — Assessment & Plan Note (Signed)
Pinpoint pain to localized area as in PE above and acute in onset. No FND and CN wnl. Can consider trigger point in temporalis muscle or other muscle. No concern for vasculitis. Can also consider occipital neuralgia. Most consistent with cutaneous dysthesia. This diagnosis is consistent with patient's history of anxiety and depression.   Will treat with indomethacin x 7 days and voltaren gel.   F/u in two weeks

## 2019-01-12 ENCOUNTER — Other Ambulatory Visit: Payer: Self-pay | Admitting: Family Medicine

## 2019-01-12 DIAGNOSIS — K219 Gastro-esophageal reflux disease without esophagitis: Secondary | ICD-10-CM

## 2019-01-15 ENCOUNTER — Telehealth: Payer: Self-pay | Admitting: Family Medicine

## 2019-01-15 NOTE — Telephone Encounter (Signed)
DMV form dropped off for at front desk for completion.  Verified that patient section of form has been completed.  Last DOS/WCC with PCP was 12/23/18.  Placed form in team folder to be completed by clinical staff.  Crista Luria

## 2019-01-16 NOTE — Telephone Encounter (Signed)
DMV form placed in PCP box for completion. Aaron Reyes, CMA

## 2019-01-20 NOTE — Telephone Encounter (Signed)
Using interpreter [phone] patient contacted and informed of DMV forms ready for pick up.

## 2019-01-26 ENCOUNTER — Ambulatory Visit: Payer: Medicaid Other | Admitting: Family Medicine

## 2019-02-04 ENCOUNTER — Ambulatory Visit: Payer: Medicaid Other | Admitting: Family Medicine

## 2019-02-10 ENCOUNTER — Encounter (HOSPITAL_COMMUNITY): Payer: Self-pay | Admitting: Emergency Medicine

## 2019-02-10 ENCOUNTER — Other Ambulatory Visit: Payer: Self-pay

## 2019-02-10 ENCOUNTER — Emergency Department (HOSPITAL_COMMUNITY): Payer: Medicaid Other

## 2019-02-10 ENCOUNTER — Emergency Department (HOSPITAL_COMMUNITY)
Admission: EM | Admit: 2019-02-10 | Discharge: 2019-02-10 | Disposition: A | Discharge status: Home / Self Care | Payer: Medicaid Other | Attending: Emergency Medicine | Admitting: Emergency Medicine

## 2019-02-10 DIAGNOSIS — Z7984 Long term (current) use of oral hypoglycemic drugs: Secondary | ICD-10-CM | POA: Diagnosis not present

## 2019-02-10 DIAGNOSIS — Y9389 Activity, other specified: Secondary | ICD-10-CM | POA: Insufficient documentation

## 2019-02-10 DIAGNOSIS — S299XXA Unspecified injury of thorax, initial encounter: Secondary | ICD-10-CM

## 2019-02-10 DIAGNOSIS — W230XXA Caught, crushed, jammed, or pinched between moving objects, initial encounter: Secondary | ICD-10-CM | POA: Diagnosis not present

## 2019-02-10 DIAGNOSIS — I252 Old myocardial infarction: Secondary | ICD-10-CM | POA: Diagnosis not present

## 2019-02-10 DIAGNOSIS — S280XXA Crushed chest, initial encounter: Secondary | ICD-10-CM | POA: Insufficient documentation

## 2019-02-10 DIAGNOSIS — Y999 Unspecified external cause status: Secondary | ICD-10-CM | POA: Diagnosis not present

## 2019-02-10 DIAGNOSIS — F17210 Nicotine dependence, cigarettes, uncomplicated: Secondary | ICD-10-CM | POA: Diagnosis not present

## 2019-02-10 DIAGNOSIS — Z7982 Long term (current) use of aspirin: Secondary | ICD-10-CM | POA: Insufficient documentation

## 2019-02-10 DIAGNOSIS — Y929 Unspecified place or not applicable: Secondary | ICD-10-CM | POA: Diagnosis not present

## 2019-02-10 DIAGNOSIS — Z79899 Other long term (current) drug therapy: Secondary | ICD-10-CM | POA: Diagnosis not present

## 2019-02-10 IMAGING — DX CHEST - 2 VIEW
2 series · 2 of 2 positions shown · non-contrast
Comparison: [DATE]

CLINICAL DATA: Heavy object fell on chest

EXAM:
CHEST - 2 VIEW

[w chest pa]
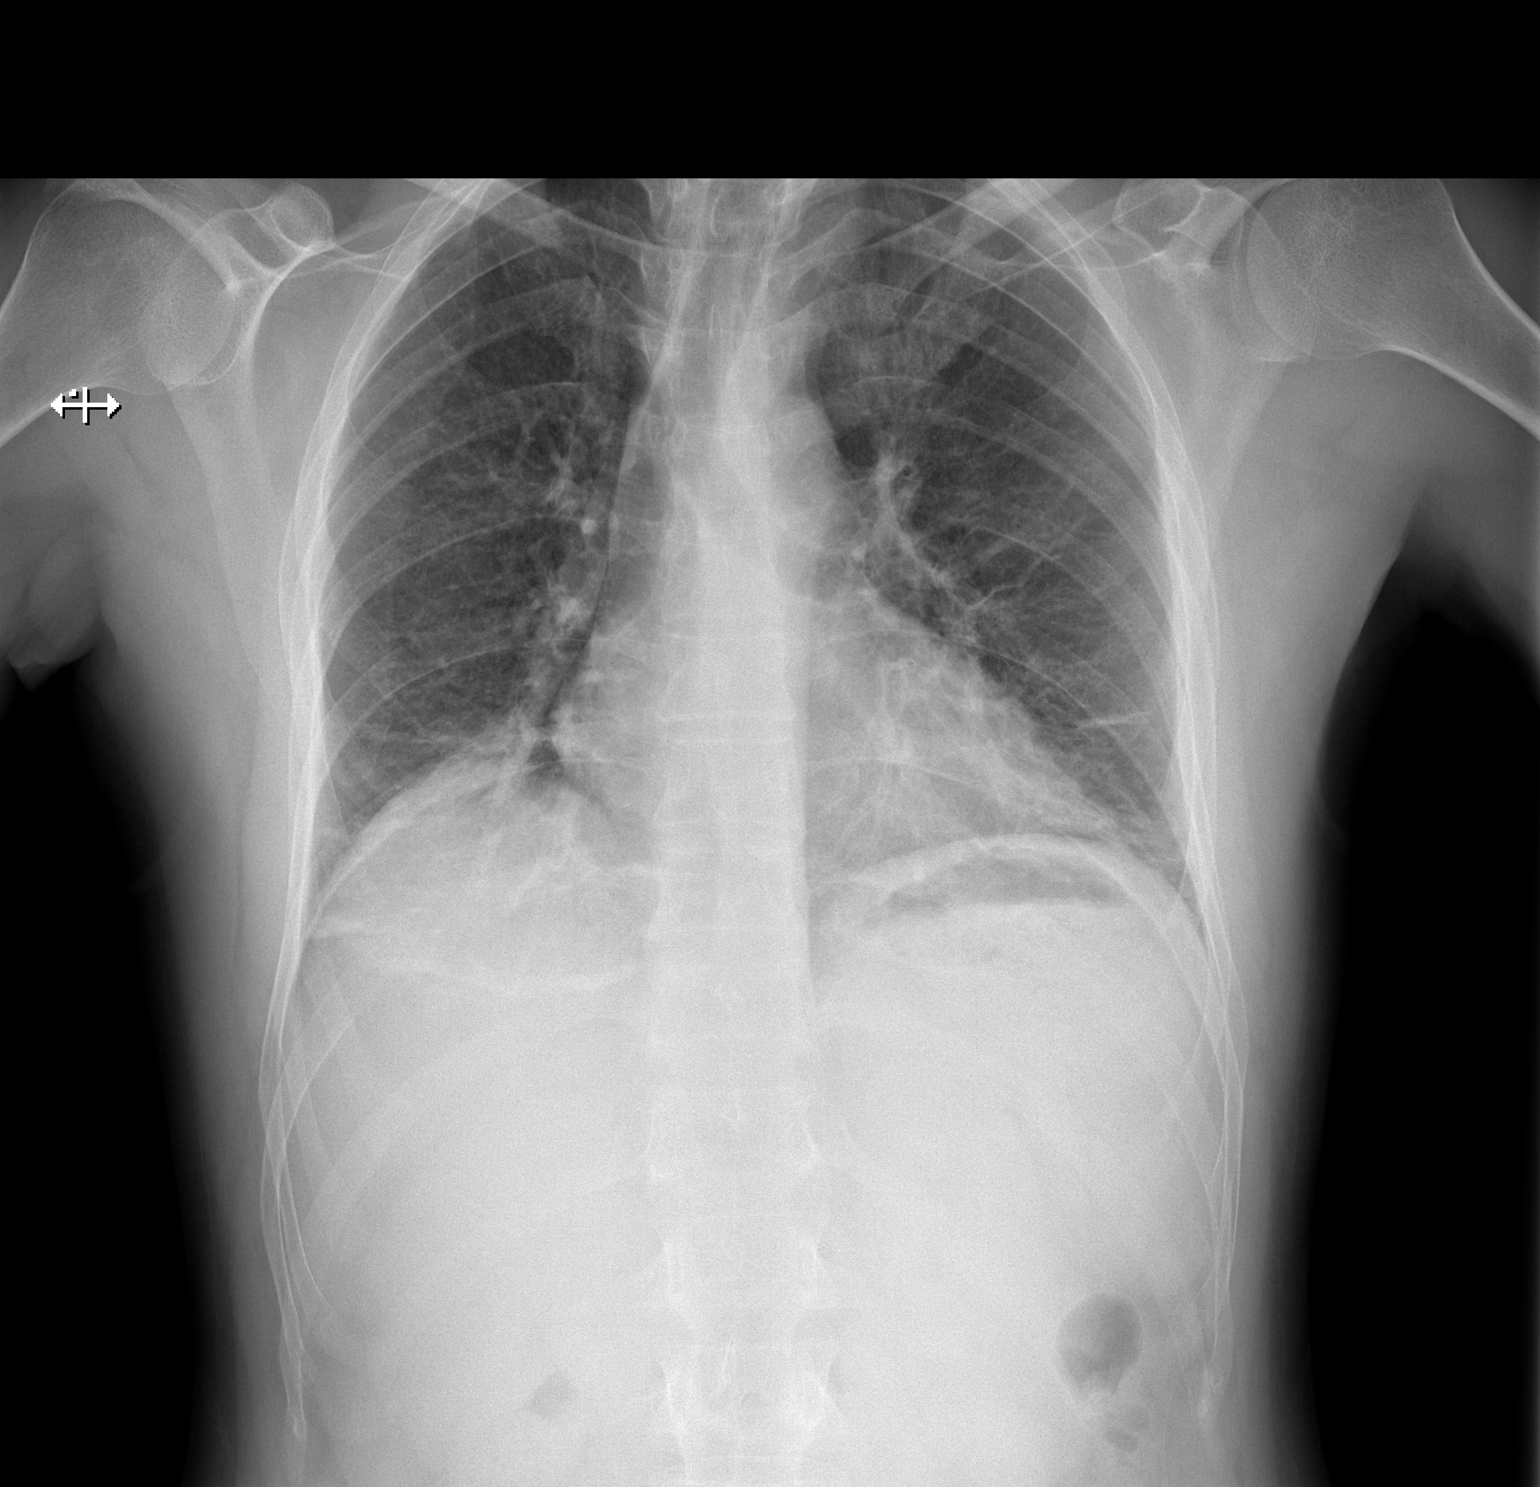

[w chest lat]
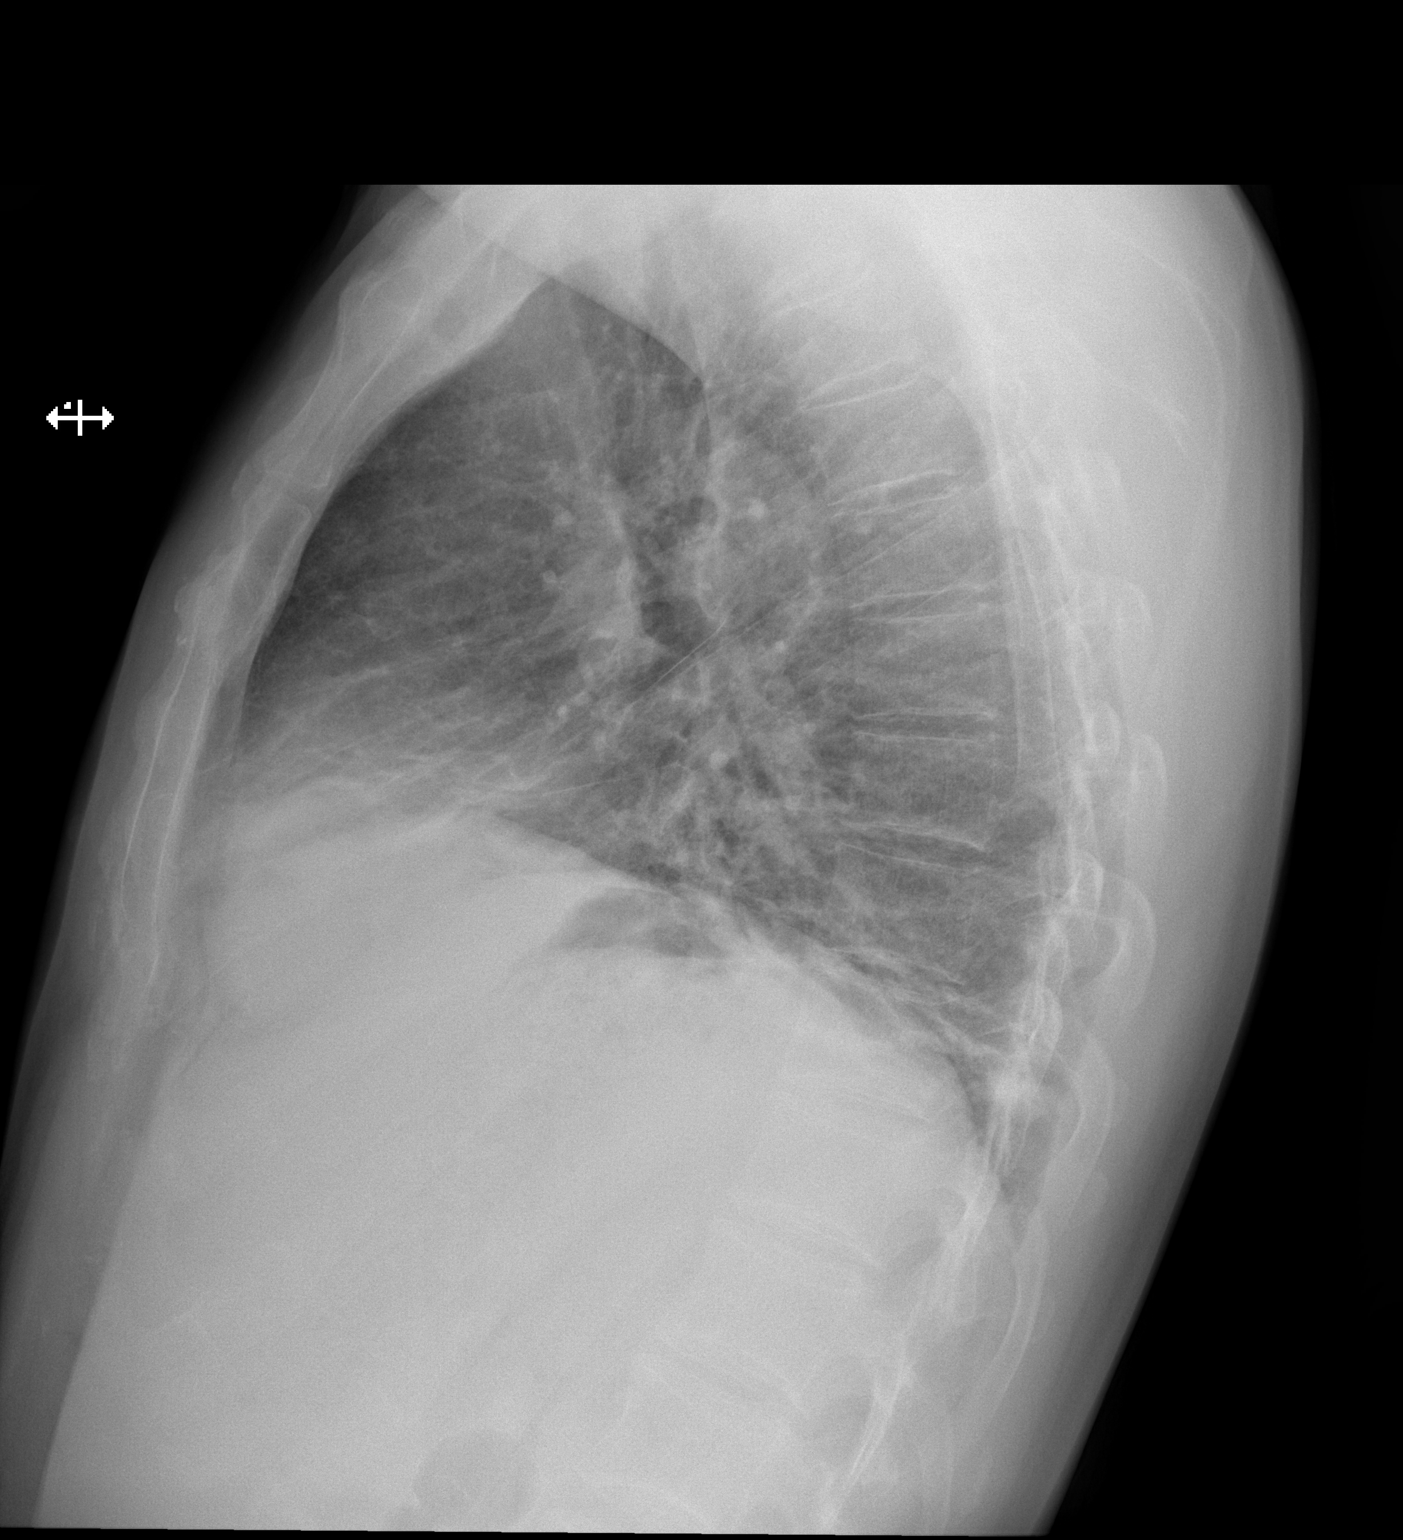

[2 of 2 positions shown; findings below may reference images not displayed]

FINDINGS: There is atelectatic change in each lung base region. There is no
evident edema or consolidation. Heart size and pulmonary vascularity
are normal. No adenopathy. No pneumothorax. No appreciable fracture.
IMPRESSION: Bibasilar atelectasis. No edema or consolidation. No pneumothorax.
Heart size normal.

## 2019-02-10 MED ORDER — HYDROCODONE-ACETAMINOPHEN 5-325 MG PO TABS
1.0000 | ORAL_TABLET | Freq: Once | ORAL | Status: AC
  Administered 2019-02-10: 1 via ORAL
  Filled 2019-02-10: qty 1

## 2019-02-10 MED ORDER — LIDOCAINE 5 % EX PTCH: 1.0000 | MEDICATED_PATCH | CUTANEOUS | 0 refills | Status: DC

## 2019-02-10 MED ORDER — DICLOFENAC SODIUM 1 % TD GEL: 4.0000 g | Freq: Four times a day (QID) | TRANSDERMAL | 1 refills | Status: DC

## 2019-02-10 MED ORDER — IBUPROFEN 400 MG PO TABS
600.0000 mg | ORAL_TABLET | Freq: Once | ORAL | Status: AC
  Administered 2019-02-10: 600 mg via ORAL
  Filled 2019-02-10: qty 1

## 2019-02-10 MED ORDER — HYDROCODONE-ACETAMINOPHEN 5-325 MG PO TABS: 1.0000 | ORAL_TABLET | Freq: Four times a day (QID) | ORAL | 0 refills | Status: DC | PRN

## 2019-02-10 NOTE — Discharge Instructions (Addendum)
Rib injuries  You have sustained a rib injury.  There were no acute abnormalities noted on the x-ray, including no sign of fractures, although there can be fractures that do not show up on the x-ray, called occult fractures.  Please adhere to the following instructions: Incentive spirometer: This device is used to ensure proper expansion of the lungs and to help prevent secondary issues, such as pneumonia.  Think of this as physical therapy for your lungs while you are injured.  Perform lung expansion exercises every 1-2 hours while awake.  Have an initial goal of 1000 mL and then work to increase this value.  Antiinflammatory medications: Take 600 mg of ibuprofen every 6 hours or 440 mg (over the counter dose) to 500 mg (prescription dose) of naproxen every 12 hours for the next 3 days. After this time, these medications may be used as needed for pain. Take these medications with food to avoid upset stomach. Choose only one of these medications, do not take them together. Tylenol: Should you continue to have additional pain while taking the ibuprofen or naproxen, you may add in tylenol as needed. Your daily total maximum amount of tylenol from all sources should be limited to 4000mg /day for persons without liver problems, or 2000mg /day for those with liver problems. Vicodin: May take Vicodin (hydrocodone-acetaminophen) as needed for severe pain.   Do not drive or perform other dangerous activities while taking this medication as it can cause drowsiness as well as changes in reaction time and judgement.   Please note that each pill of Vicodin contains 325 mg of acetaminophen (generic for Tylenol) and the above dosage limits apply. Diclofenac gel: This is a topical anti-inflammatory medication and can be applied directly to the painful region.  Do not use on the face or genitals.  This medication may be used as an alternative to oral anti-inflammatory medications, such as ibuprofen or naproxen.  Other  pain management: Many parts of pain management involve experimentation to find what works for you as an individual patient.  You may try lidocaine patches, topical pain relievers, or hot/cold packs.  You may also need to change your sleeping position.  This may involve sleeping propped up in a chair or with extra pillows.  Duration of pain: For bruising or contusions to the ribs, pain can last 4-6 weeks.  For rib fractures, you can expect to have discomfort for 6-12 weeks.  It should be noted that even if there are no obvious fractures on the x-rays, you may have what is called an occult fracture.  This simply means that the bone is broken, but is not broken enough to be noted on x-ray.  Follow-up: Please follow-up with a primary care provider for any further management of this issue.  Any further pain management should also be handled by a primary care provider.  Return: Return to the ED should you begin to have significantly worsening pain, onset of shortness of breath (not just hesitancy to take a deep breath due to pain), fever over 100.3 F accompanied by cough, coughing up blood, or any other major concerns.  For prescription assistance, may try using prescription discount sites or apps, such as goodrx.com

## 2019-02-10 NOTE — ED Provider Notes (Signed)
[facility] EMERGENCY DEPARTMENT Provider Note   CSN: [id number] Arrival date & time: 02/10/19  1612     History   Chief Complaint Chief Complaint  Patient presents with  . Chest Injury    HPI Aaron Reyes is a 43 y.o. male.     A language interpreter was used (Arabic).     Aaron Reyes is a 43 y.o. male, with a history of GERD, kidney stone, MI, presenting to the ED with injury to the right upper chest that occurred last night. States he was removing a sofa from a vehicle, lost his balance, and the sofa landed on his right chest.  He states the pain is moderate to severe, described as a soreness and aching, nonradiating. He is not tried any therapies for his complaint. Denies syncope, dizziness, sustained shortness of breath, hemoptysis, vomiting, extremity numbness/weakness, or any other complaints.    Past Medical History:  Diagnosis Date  . GERD (gastroesophageal reflux disease)   . Kidney stone   . Myocardial infarct ([place])   . Non-cardiac chest pain   . 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 S/P cardiac catheterization 03/2015   cath in [place] and [place], no records,  cath at [facility] normal coronary arteries and normal LV function.  . Testicular mass 03/18/2015    Patient Active Problem List   Diagnosis Date Noted  . Scalp pain 01/02/2019  . Language barrier 01/02/2019  . Food insecurity 01/02/2019  . Closed fracture of body of sternum 06/09/2018  . Anxiety   . Tobacco abuse 01/01/2018  . Bilateral sensorineural hearing loss 02/07/2017  . Cervicalgia 10/19/2016  . GERD (gastroesophageal reflux disease) 09/04/2016  . Generalized anxiety disorder 01/26/2016  . Post traumatic stress disorder 01/26/2016  . Adjustment disorder with anxious mood 04/26/2015  . 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 Dysuria 04/26/2015  . Housing problems 03/18/2015    Past Surgical History:  Procedure Laterality Date  . CARDIAC  CATHETERIZATION    . CARDIAC CATHETERIZATION     x 2  . CARDIAC CATHETERIZATION N/A 04/11/2015   Procedure: Left Heart Cath and Coronary Angiography;  Surgeon: Belva Crome, MD;  Location: [facility];  Service: Cardiovascular;  Laterality: N/A;  . HERNIA REPAIR          Home Medications    Prior to Admission medications   Medication Sig Start Date End Date Taking? Authorizing Provider  amitriptyline (ELAVIL) 50 MG tablet Take 0.5 tablets (25 mg total) by mouth at bedtime. 09/02/17   Mercy Riding, MD  aspirin EC 81 MG tablet Take 1 tablet (81 mg total) by mouth daily. 04/28/15   Isaiah Serge, NP  blood glucose meter kit and supplies KIT Dispense based on patient and insurance preference. Use up to four times daily as directed. (FOR ICD-9 250.00, 250.01). 07/14/18   Martyn Malay, MD  busPIRone (BUSPAR) 15 MG tablet Take 1 tablet (15 mg total) by mouth 3 (three) times daily. 12/10/16   Scot Jun, FNP  diclofenac sodium (VOLTAREN) 1 % GEL Apply 4 g topically 4 (four) times daily. 12/31/18   Wilber Oliphant, MD  diclofenac sodium (VOLTAREN) 1 % GEL Apply 4 g topically 4 (four) times daily. 02/10/19   Kyleeann Cremeans C, PA-C  escitalopram (LEXAPRO) 20 MG tablet Take 1 tablet (20 mg total) by mouth at bedtime. 12/10/16   Scot Jun, FNP  famotidine (PEPCID) 20 MG tablet Take 1 tablet (20  mg total) by mouth 2 (two) times daily. 01/14/18   Sherene Sires, DO  HYDROcodone-acetaminophen (NORCO/VICODIN) 5-325 MG tablet Take 1-2 tablets by mouth every 6 (six) hours as needed for severe pain. 02/10/19   Javayah Magaw C, PA-C  ibuprofen (ADVIL,MOTRIN) 800 MG tablet Take 1 tablet (800 mg total) by mouth every 8 (eight) hours as needed for up to 20 doses. Patient not taking: Reported on 10/03/2018 06/03/18   Lennice Sites, DO  lidocaine (LIDODERM) 5 % Place 1 patch onto the skin daily. Remove & Discard patch within 12 hours or as directed by MD 02/10/19   Lorayne Bender, PA-C  meclizine  (ANTIVERT) 25 MG tablet Take 1 tablet (25 mg total) by mouth 2 (two) times daily as needed for dizziness. Patient not taking: Reported on 10/03/2018 01/03/18   Matilde Haymaker, MD  metFORMIN (GLUCOPHAGE) 500 MG tablet TAKE 1 TABLET(500 MG) BY MOUTH DAILY WITH BREAKFAST 12/05/18   Everrett Coombe, MD  nitroGLYCERIN (NITROSTAT) 0.4 MG SL tablet Place 1 tablet (0.4 mg total) under the tongue every 5 (five) minutes as needed for chest pain. 11/03/18   Sherene Sires, DO  OVER THE COUNTER MEDICATION Take 1 tablet by mouth daily as needed (pain/sore throat). otc - medication for pain/sore throat    [provider]  pantoprazole (PROTONIX) 40 MG tablet TAKE 1 TABLET(40 MG) BY MOUTH DAILY 01/13/19   Sherene Sires, DO  PRESCRIPTION MEDICATION     [provider]  rosuvastatin (CRESTOR) 20 MG tablet Take 1 tablet (20 mg total) by mouth daily. 01/03/18   Matilde Haymaker, MD  traZODone (DESYREL) 100 MG tablet Take 2 tablets (200 mg total) by mouth at bedtime. 12/10/16   Scot Jun, FNP    Family History Family History  Problem Relation Age of Onset  . CAD Father   . Heart attack Father   . Hypertension Father   . Stroke Father   . Cancer Mother   . Diabetes Mother   . Hypertension Mother     Social History Social History   Tobacco Use  . Smoking status: Current Every Day Smoker    Packs/day: 0.25    Types: Cigarettes  . Smokeless tobacco: Never Used  Substance Use Topics  . Alcohol use: No  . Drug use: No     Allergies   Pork-derived products   Review of Systems Review of Systems  Constitutional: Negative for fever.  Respiratory: Negative for cough.   Gastrointestinal: Negative for nausea and vomiting.  Musculoskeletal: Negative for back pain and neck pain.       Chest tenderness.  Neurological: Negative for weakness and numbness.     Physical Exam Updated Vital Signs BP (!) 120/91   Pulse 81   Temp 98 F (36.7 C) (Oral)   Resp (!) 23   SpO2 99%   Physical Exam  Vitals signs and nursing note reviewed.  Constitutional:      General: He is not in acute distress.    Appearance: He is well-developed. He is not diaphoretic.  HENT:     Head: Normocephalic and atraumatic.  Eyes:     Conjunctiva/sclera: Conjunctivae normal.  Neck:     Musculoskeletal: Neck supple.  Cardiovascular:     Rate and Rhythm: Normal rate and regular rhythm.     Pulses: Normal pulses.     Heart sounds: Normal heart sounds.  Pulmonary:     Effort: Pulmonary effort is normal.     Breath sounds: Normal breath sounds.  Comments: No tachypnea or increased work of breathing.  Speaks in full sentences without noted difficulty. Chest:     Chest wall: Tenderness present. No deformity, swelling, crepitus or edema.       Comments: Tenderness in the right upper chest, as shown.  No color change, deformity, instability, crepitus, or swelling. Musculoskeletal:     Comments: Full range of motion in the right shoulder without noted difficulty. No tenderness, swelling, deformity, or instability over the length of the entire right clavicle.  Skin:    General: Skin is warm and dry.     Coloration: Skin is not pale.  Neurological:     Mental Status: He is alert.     Comments: Sensation grossly intact to light touch through each of the nerve distributions of the bilateral upper extremities. Abduction and adduction of the fingers intact against resistance. Grip strength equal bilaterally. Supination and pronation intact against resistance. Strength 5/5 through the cardinal directions of the bilateral wrists. Strength 5/5 with flexion and extension of the bilateral elbows. Patient can touch the thumb to each one of the fingertips without difficulty.   Psychiatric:        Behavior: Behavior normal.      ED Treatments / Results  Labs (all labs ordered are listed, but only abnormal results are displayed) Labs Reviewed - No data to display  EKG None  Radiology Dg Chest 2 View   Result Date: 02/10/2019 CLINICAL DATA:  Heavy object fell on chest EXAM: CHEST - 2 VIEW COMPARISON:  October 03, 2018 FINDINGS: There is atelectatic change in each lung base region. There is no evident edema or consolidation. Heart size and pulmonary vascularity are normal. No adenopathy. No pneumothorax. No appreciable fracture. IMPRESSION: Bibasilar atelectasis. No edema or consolidation. No pneumothorax. Heart size normal. Electronically Signed   By: Lowella Grip III M.D.   On: 02/10/2019 17:09    Procedures Procedures (including critical care time)  Medications Ordered in ED Medications  HYDROcodone-acetaminophen (NORCO/VICODIN) 5-325 MG per tablet 1 tablet (1 tablet Oral Given 02/10/19 1908)  ibuprofen (ADVIL) tablet 600 mg (600 mg Oral Given 02/10/19 1907)     Initial Impression / Assessment and Plan / ED Course  I have reviewed the triage vital signs and the nursing notes.  Pertinent labs & imaging results that were available during my care of the patient were reviewed by me and considered in my medical decision making (see chart for details).        Patient presents following an injury to the chest that occurred last night. Patient is nontoxic appearing, not tachycardic, not tachypneic, not hypotensive, maintains excellent SPO2 on room air. The patient was given instructions for home care as well as return precautions. Patient voices understanding of these instructions, accepts the plan, and is comfortable with discharge.    Vitals:   02/10/19 1800 02/10/19 1830 02/10/19 1840 02/10/19 1901  BP: 121/83 (!) 120/91  (!) 126/91  Pulse: 78 81 81 74  Resp: (!) 23   16  Temp:      TempSrc:      SpO2: 98% 99% 100% 100%     Final Clinical Impressions(s) / ED Diagnoses   Final diagnoses:  Chest injury, initial encounter    ED Discharge Orders         Ordered    HYDROcodone-acetaminophen (NORCO/VICODIN) 5-325 MG tablet  Every 6 hours PRN     02/10/19 1857    diclofenac  sodium (VOLTAREN) 1 % GEL  4  times daily     02/10/19 1857    lidocaine (LIDODERM) 5 %  Every 24 hours     02/10/19 1857           Layla Maw 02/10/19 2100    Daleen Bo, MD 02/12/19 1726

## 2019-02-10 NOTE — ED Notes (Signed)
Used interpreter to d/c pt

## 2019-02-10 NOTE — ED Triage Notes (Signed)
Pt states yesterday a sofa fell onto his right chest and feels like it broke his collar bone and hurts worse with deep breaths. The sofa was on top of a car and as he tried to get it off it fell onto his chest. Pt did not hit his head or have any LOC.

## 2019-02-16 ENCOUNTER — Ambulatory Visit (HOSPITAL_COMMUNITY)
Admission: EM | Admit: 2019-02-16 | Discharge: 2019-02-16 | Disposition: A | Discharge status: Home / Self Care | Payer: Medicaid Other | Attending: Family Medicine | Admitting: Family Medicine

## 2019-02-16 ENCOUNTER — Encounter (HOSPITAL_COMMUNITY): Payer: Self-pay | Admitting: Emergency Medicine

## 2019-02-16 ENCOUNTER — Ambulatory Visit (INDEPENDENT_AMBULATORY_CARE_PROVIDER_SITE_OTHER): Payer: Medicaid Other

## 2019-02-16 ENCOUNTER — Other Ambulatory Visit: Payer: Self-pay

## 2019-02-16 DIAGNOSIS — R0789 Other chest pain: Secondary | ICD-10-CM

## 2019-02-16 LAB — POCT URINALYSIS DIP (DEVICE)
Glucose, UA: NEGATIVE mg/dL
Hgb urine dipstick: NEGATIVE
Ketones, ur: NEGATIVE mg/dL
Leukocytes,Ua: NEGATIVE
Nitrite: NEGATIVE
Protein, ur: NEGATIVE mg/dL
Specific Gravity, Urine: >=1.03 (ref 1.005–1.030)
Urobilinogen, UA: 0.2 mg/dL (ref 0.0–1.0)
pH: 5 (ref 5.0–8.0)

## 2019-02-16 IMAGING — DX DG CHEST 2V
2 series · 2 of 2 positions shown · non-contrast
Comparison: Chest x-ray [DATE].

CLINICAL DATA: Worsening chest pain, SOB

EXAM:
CHEST - 2 VIEW

[chest pa]
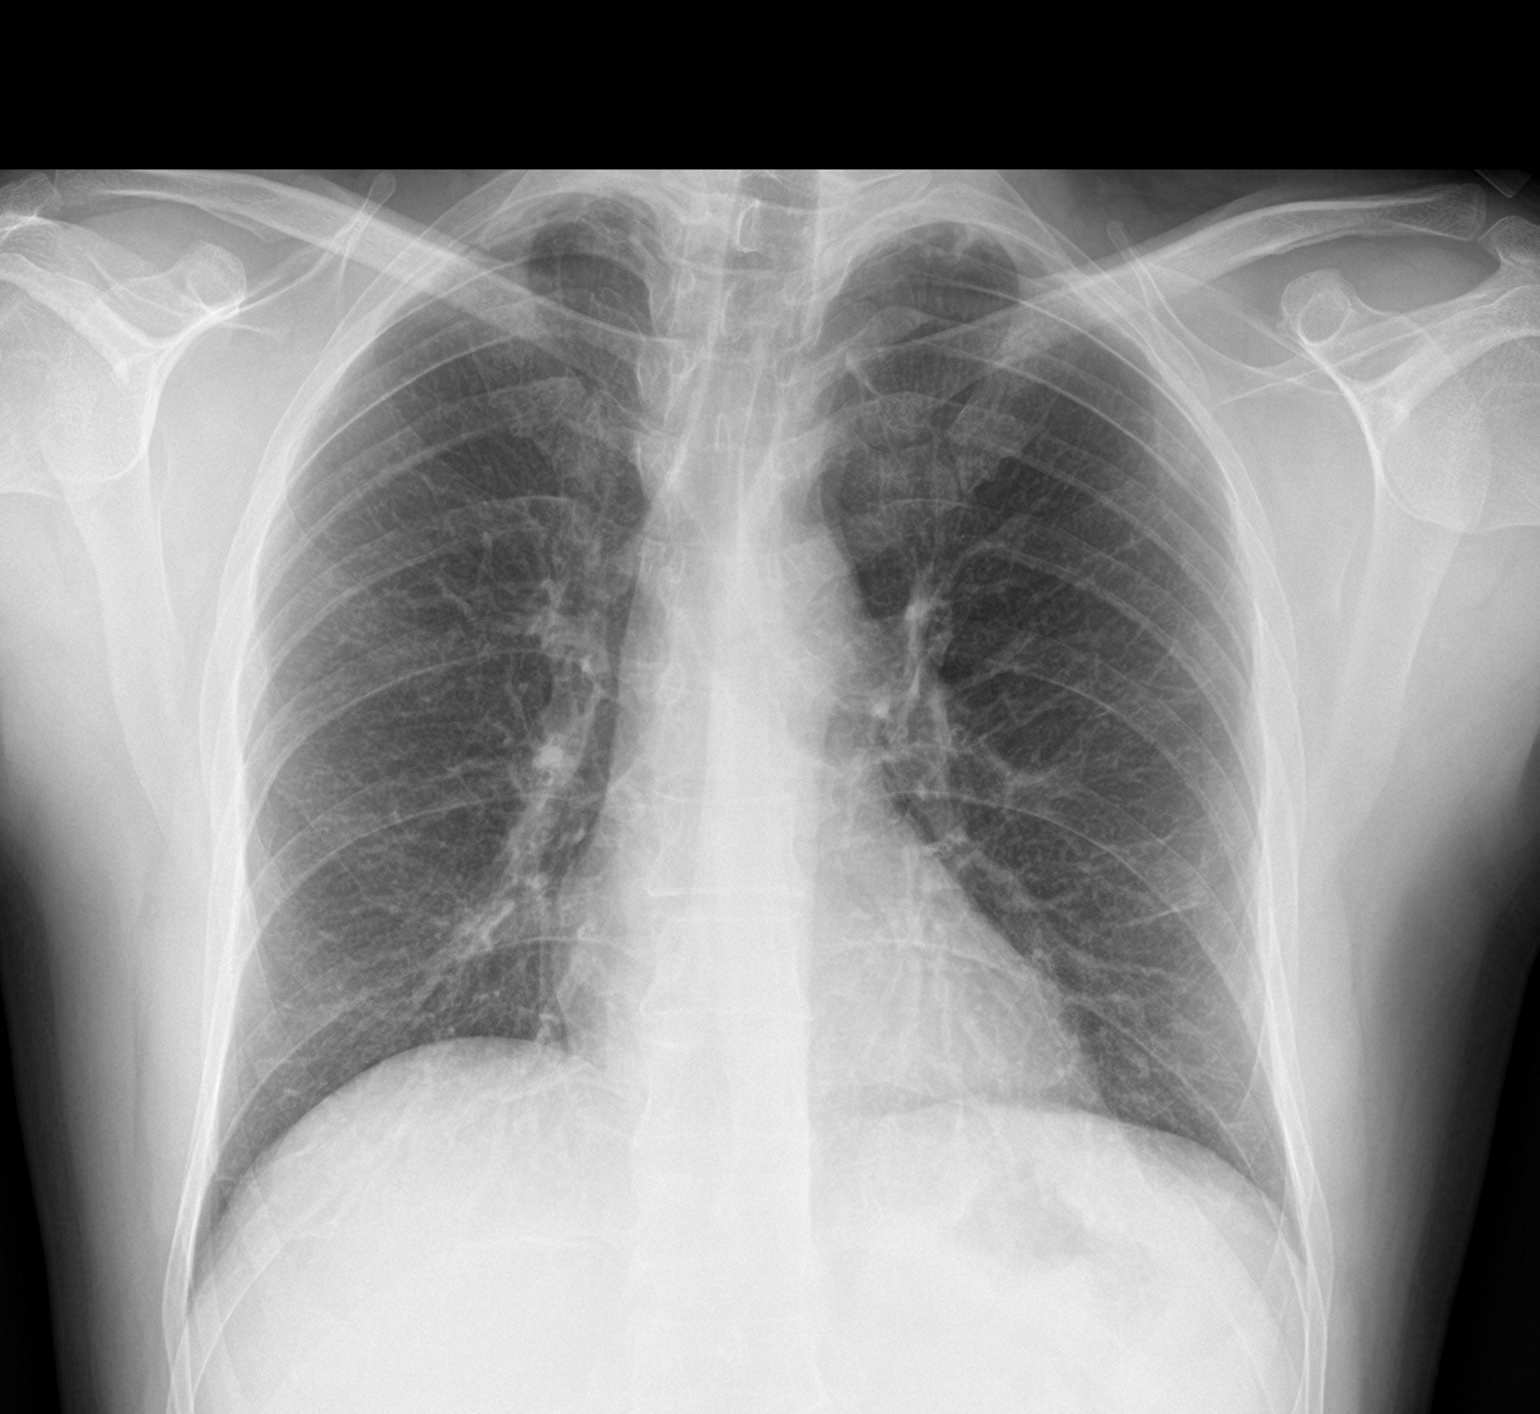

[chest lat]
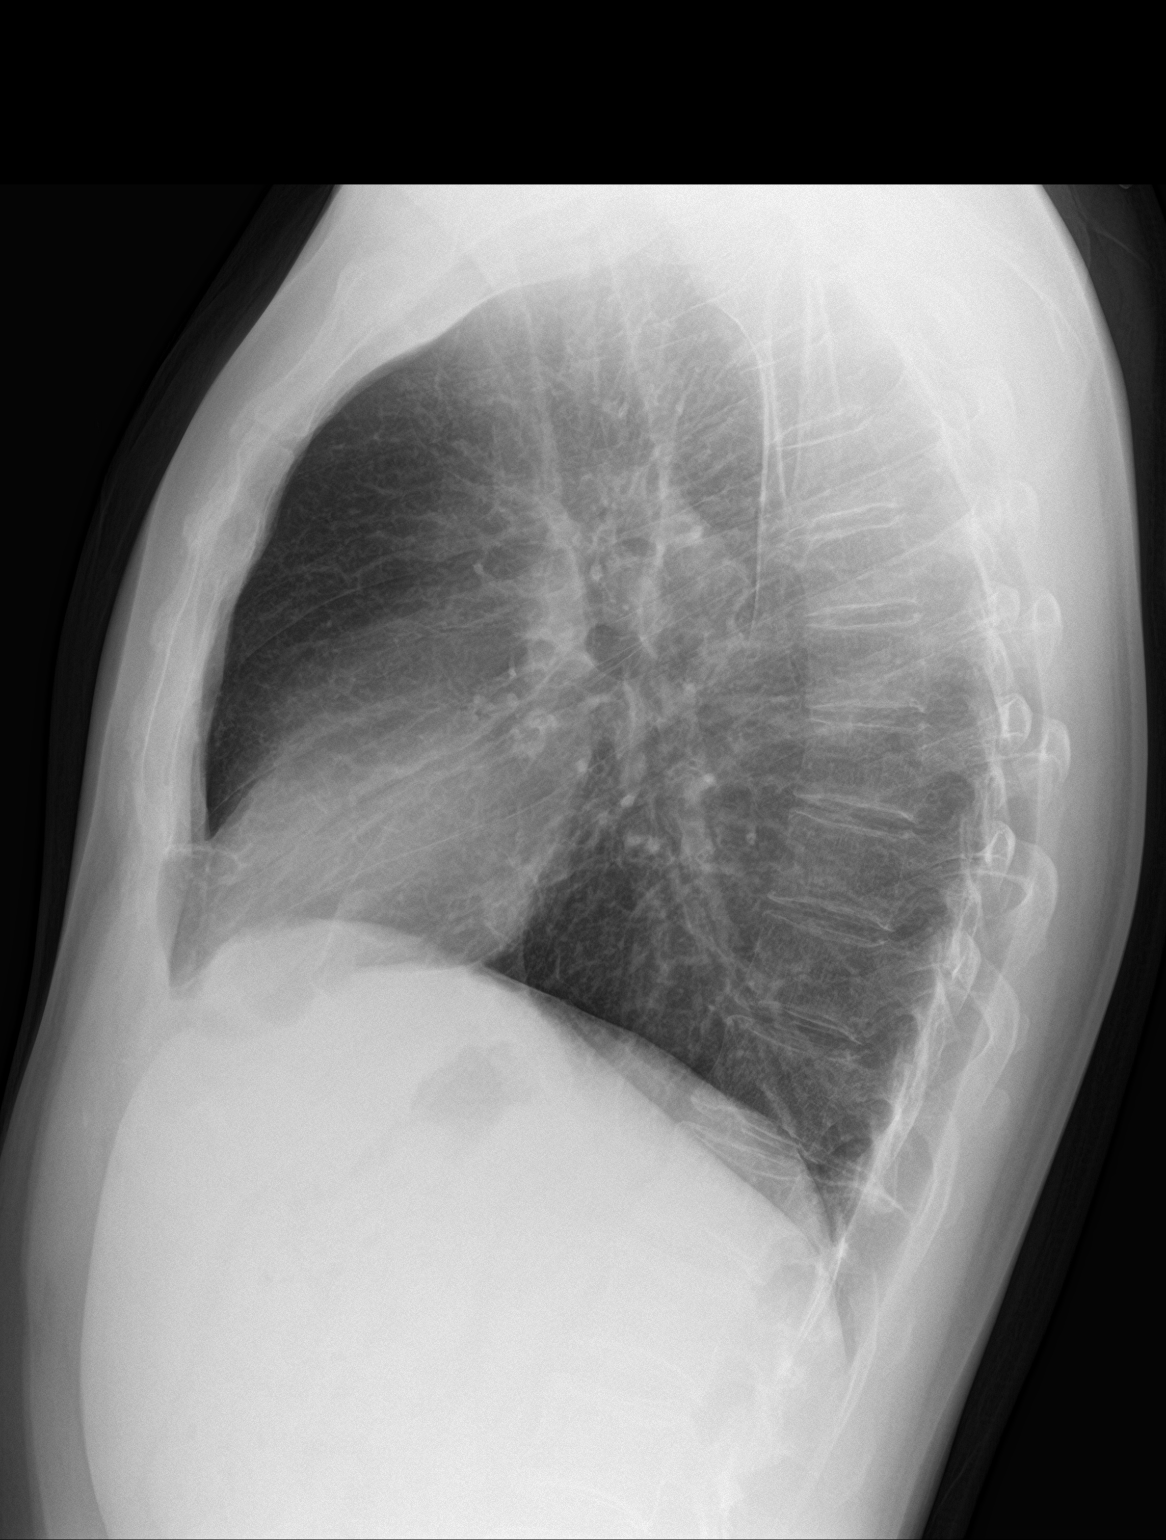

[2 of 2 positions shown; findings below may reference images not displayed]

FINDINGS: Mediastinum hilar structures normal. Mild bibasilar subsegmental
atelectasis. No pleural effusion or pneumothorax. Heart size normal.
Degenerative change thoracic spine.
IMPRESSION: Mild bibasilar subsegmental atelectasis.  No acute abnormality.

## 2019-02-16 MED ORDER — KETOROLAC TROMETHAMINE 30 MG/ML IJ SOLN
30.0000 mg | Freq: Once | INTRAMUSCULAR | Status: AC
  Administered 2019-02-16: 30 mg via INTRAMUSCULAR

## 2019-02-16 MED ORDER — CYCLOBENZAPRINE HCL 10 MG PO TABS: 10.0000 mg | ORAL_TABLET | Freq: Two times a day (BID) | ORAL | 0 refills | Status: DC | PRN

## 2019-02-16 MED ORDER — NAPROXEN 500 MG PO TABS: 500.0000 mg | ORAL_TABLET | Freq: Two times a day (BID) | ORAL | 0 refills | Status: DC

## 2019-02-16 MED ORDER — KETOROLAC TROMETHAMINE 30 MG/ML IJ SOLN
INTRAMUSCULAR | Status: AC
  Filled 2019-02-16: qty 1

## 2019-02-16 NOTE — Discharge Instructions (Addendum)
Your x-ray was normal.  There is no signs of infection or other concerning problems. I believe this is all musculoskeletal chest wall pain.  We will treat this with Flexeril which is a muscle relaxant.  Be aware this may make you drowsy Naproxen twice a day as needed for pain inflammation Given you a Toradol injection here in clinic for pain Follow up as needed for continued or worsening symptoms

## 2019-02-16 NOTE — ED Triage Notes (Signed)
Pt sts right sided CP after sofa fell off of a car onto him yesterday; pt was seen for same in ED yesterday

## 2019-02-16 NOTE — ED Provider Notes (Signed)
[facility]    CSN: [id number] Arrival date & time: 02/16/19  1333      History   Chief Complaint Chief Complaint  Patient presents with  . Chest Pain    HPI Aaron Reyes is a 43 y.o. male.   Patient is a 43 year old male with past medical history of GERD, kidney stone, MI, anxiety.  He presents today with worsening right chest wall pain radiating into his right upper back area.  This is been constant.  He was seen in the ED are on the 25th after he sustained an injury where a couch fell on his chest.  At that time they did a chest x-ray which was normal.  He has been using the lidocaine patch and hydrocodone without any relief.  Reporting hard to take a deep breath, and hard to cough up any mucus.  He also gave him a spirometer to expand the lungs and prevent infection. Denies any fever but he has been anxious. Reports that he feels crackling in the chest wall with breathing.   ROS per HPI      Past Medical History:  Diagnosis Date  . GERD (gastroesophageal reflux disease)   . Kidney stone   . Myocardial infarct ([place])   . Non-cardiac chest pain   . 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 S/P cardiac catheterization 03/2015   cath in [place], no records,  cath at [facility] normal coronary arteries and normal LV function.  . Testicular mass 03/18/2015    Patient Active Problem List   Diagnosis Date Noted  . Scalp pain 01/02/2019  . Language barrier 01/02/2019  . Food insecurity 01/02/2019  . Closed fracture of body of sternum 06/09/2018  . Anxiety   . Tobacco abuse 01/01/2018  . Bilateral sensorineural hearing loss 02/07/2017  . Cervicalgia 10/19/2016  . GERD (gastroesophageal reflux disease) 09/04/2016  . Generalized anxiety disorder 01/26/2016  . Post traumatic stress disorder 01/26/2016  . Adjustment disorder with anxious mood 04/26/2015  . 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 Dysuria 04/26/2015  . Housing problems 03/18/2015     Past Surgical History:  Procedure Laterality Date  . CARDIAC CATHETERIZATION    . CARDIAC CATHETERIZATION     x 2  . CARDIAC CATHETERIZATION N/A 04/11/2015   Procedure: Left Heart Cath and Coronary Angiography;  Surgeon: Belva Crome, MD;  Location: [facility] CV LAB;  Service: Cardiovascular;  Laterality: N/A;  . HERNIA REPAIR         Home Medications    Prior to Admission medications   Medication Sig Start Date End Date Taking? Authorizing Provider  amitriptyline (ELAVIL) 50 MG tablet Take 0.5 tablets (25 mg total) by mouth at bedtime. 09/02/17   Mercy Riding, MD  aspirin EC 81 MG tablet Take 1 tablet (81 mg total) by mouth daily. 04/28/15   Isaiah Serge, NP  blood glucose meter kit and supplies KIT Dispense based on patient and insurance preference. Use up to four times daily as directed. (FOR ICD-9 250.00, 250.01). 07/14/18   Martyn Malay, MD  busPIRone (BUSPAR) 15 MG tablet Take 1 tablet (15 mg total) by mouth 3 (three) times daily. 12/10/16   Scot Jun, FNP  cyclobenzaprine (FLEXERIL) 10 MG tablet Take 1 tablet (10 mg total) by mouth 2 (two) times daily as needed for muscle spasms. 02/16/19   Loura Halt A, NP  diclofenac sodium (VOLTAREN) 1 % GEL Apply 4 g topically 4 (four) times  daily. 12/31/18   Wilber Oliphant, MD  diclofenac sodium (VOLTAREN) 1 % GEL Apply 4 g topically 4 (four) times daily. 02/10/19   Joy, Shawn C, PA-C  escitalopram (LEXAPRO) 20 MG tablet Take 1 tablet (20 mg total) by mouth at bedtime. 12/10/16   Scot Jun, FNP  famotidine (PEPCID) 20 MG tablet Take 1 tablet (20 mg total) by mouth 2 (two) times daily. 01/14/18   Sherene Sires, DO  HYDROcodone-acetaminophen (NORCO/VICODIN) 5-325 MG tablet Take 1-2 tablets by mouth every 6 (six) hours as needed for severe pain. 02/10/19   Joy, Shawn C, PA-C  ibuprofen (ADVIL,MOTRIN) 800 MG tablet Take 1 tablet (800 mg total) by mouth every 8 (eight) hours as needed for up to 20 doses. Patient not taking:  Reported on 10/03/2018 06/03/18   Lennice Sites, DO  lidocaine (LIDODERM) 5 % Place 1 patch onto the skin daily. Remove & Discard patch within 12 hours or as directed by MD 02/10/19   Lorayne Bender, PA-C  meclizine (ANTIVERT) 25 MG tablet Take 1 tablet (25 mg total) by mouth 2 (two) times daily as needed for dizziness. Patient not taking: Reported on 10/03/2018 01/03/18   Matilde Haymaker, MD  metFORMIN (GLUCOPHAGE) 500 MG tablet TAKE 1 TABLET(500 MG) BY MOUTH DAILY WITH BREAKFAST 12/05/18   Everrett Coombe, MD  naproxen (NAPROSYN) 500 MG tablet Take 1 tablet (500 mg total) by mouth 2 (two) times daily. 02/16/19   Loura Halt A, NP  nitroGLYCERIN (NITROSTAT) 0.4 MG SL tablet Place 1 tablet (0.4 mg total) under the tongue every 5 (five) minutes as needed for chest pain. 11/03/18   Sherene Sires, DO  OVER THE COUNTER MEDICATION Take 1 tablet by mouth daily as needed (pain/sore throat). otc - medication for pain/sore throat    [provider]  pantoprazole (PROTONIX) 40 MG tablet TAKE 1 TABLET(40 MG) BY MOUTH DAILY 01/13/19   Sherene Sires, DO  PRESCRIPTION MEDICATION     [provider]  rosuvastatin (CRESTOR) 20 MG tablet Take 1 tablet (20 mg total) by mouth daily. 01/03/18   Matilde Haymaker, MD  traZODone (DESYREL) 100 MG tablet Take 2 tablets (200 mg total) by mouth at bedtime. 12/10/16   Scot Jun, FNP    Family History Family History  Problem Relation Age of Onset  . CAD Father   . Heart attack Father   . Hypertension Father   . Stroke Father   . Cancer Mother   . Diabetes Mother   . Hypertension Mother     Social History Social History   Tobacco Use  . Smoking status: Current Every Day Smoker    Packs/day: 0.25    Types: Cigarettes  . Smokeless tobacco: Never Used  Substance Use Topics  . Alcohol use: No  . Drug use: No     Allergies   Pork-derived products   Review of Systems Review of Systems   Physical Exam Triage Vital Signs ED Triage Vitals  Enc Vitals  Group     BP 02/16/19 1422 113/76     Pulse Rate 02/16/19 1422 83     Resp 02/16/19 1422 18     Temp 02/16/19 1422 98.3 F (36.8 C)     Temp Source 02/16/19 1422 Oral     SpO2 02/16/19 1422 99 %     Weight --      Height --      Head Circumference --      Peak Flow --  Pain Score 02/16/19 1423 5     Pain Loc --      Pain Edu? --      Excl. in [place]? --    No data found.  Updated Vital Signs BP 113/76 (BP Location: Left Arm)   Pulse 83   Temp 98.3 F (36.8 C) (Oral)   Resp 18   SpO2 99%   Visual Acuity Right Eye Distance:   Left Eye Distance:   Bilateral Distance:    Right Eye Near:   Left Eye Near:    Bilateral Near:     Physical Exam Vitals signs and nursing note reviewed.  Constitutional:      General: He is not in acute distress.    Appearance: He is well-developed. He is not toxic-appearing.     Comments: Appears in pain Very anxious   HENT:     Head: Normocephalic and atraumatic.  Neck:     Musculoskeletal: Normal range of motion.     Thyroid: No thyromegaly.     Trachea: No tracheal deviation.  Cardiovascular:     Rate and Rhythm: Normal rate and regular rhythm.     Heart sounds: Normal heart sounds.  Pulmonary:     Effort: Pulmonary effort is normal.     Breath sounds: Normal breath sounds.  Chest:     Chest wall: Tenderness present. No mass, deformity, crepitus or edema.    Musculoskeletal: Normal range of motion.  Lymphadenopathy:     Cervical: No cervical adenopathy.  Skin:    General: Skin is warm and dry.     Coloration: Skin is not cyanotic.     Findings: No ecchymosis.     Nails: There is no clubbing.   Neurological:     General: No focal deficit present.     Mental Status: He is alert.  Psychiatric:        Mood and Affect: Mood is anxious.      UC Treatments / Results  Labs (all labs ordered are listed, but only abnormal results are displayed) Labs Reviewed  POCT URINALYSIS DIP (DEVICE) - Abnormal; Notable for the  following components:      Result Value   Bilirubin Urine SMALL (*)    All other components within normal limits    EKG   Radiology Dg Chest 2 View  Result Date: 02/16/2019 CLINICAL DATA:  Worsening chest pain, SOB EXAM: CHEST - 2 VIEW COMPARISON:  Chest x-ray 11/05/2018. FINDINGS: Mediastinum hilar structures normal. Mild bibasilar subsegmental atelectasis. No pleural effusion or pneumothorax. Heart size normal. Degenerative change thoracic spine. IMPRESSION: Mild bibasilar subsegmental atelectasis.  No acute abnormality. Electronically Signed   By: Marcello Moores  Register   On: 02/16/2019 16:18    Procedures Procedures (including critical care time)  Medications Ordered in UC Medications  ketorolac (TORADOL) 30 MG/ML injection 30 mg (30 mg Intramuscular Given 02/16/19 1640)  ketorolac (TORADOL) 30 MG/ML injection (has no administration in time range)    Initial Impression / Assessment and Plan / UC Course  I have reviewed the triage vital signs and the nursing notes.  Pertinent labs & imaging results that were available during my care of the patient were reviewed by me and considered in my medical decision making (see chart for details).    MS chest wall pain-  X ray with no acute abnormalities No concern for infectious process There is no crepitus or SQ air.  Most likely chest wall muscular pain Since the medication he was taking is not  working we will give Toradol injection here in the clinic and try muscle relaxant to see if this better helps his symptoms. Also doing Naproxen for pain and inflammation.  VSS and he is non toxic or ill appearing.  Follow up as needed for continued or worsening symptoms All information was obtained, explained and interpreted using the interpretor machine.  Final Clinical Impressions(s) / UC Diagnoses   Final diagnoses:  Chest wall pain     Discharge Instructions     Your x-ray was normal.  There is no signs of infection or other concerning  problems. I believe this is all musculoskeletal chest wall pain.  We will treat this with Flexeril which is a muscle relaxant.  Be aware this may make you drowsy Naproxen twice a day as needed for pain inflammation Given you a Toradol injection here in clinic for pain Follow up as needed for continued or worsening symptoms     ED Prescriptions    Medication Sig Dispense Auth. Provider   cyclobenzaprine (FLEXERIL) 10 MG tablet Take 1 tablet (10 mg total) by mouth 2 (two) times daily as needed for muscle spasms. 20 tablet Norine Reddington A, NP   naproxen (NAPROSYN) 500 MG tablet Take 1 tablet (500 mg total) by mouth 2 (two) times daily. 30 tablet Orvan July, NP     Controlled Substance Prescriptions [place] Controlled Substance Registry consulted? no   Orvan July, NP 02/17/19 [phone]

## 2019-02-18 ENCOUNTER — Ambulatory Visit: Payer: Medicaid Other | Admitting: Family Medicine

## 2019-04-07 ENCOUNTER — Encounter (HOSPITAL_COMMUNITY): Payer: Self-pay | Admitting: Emergency Medicine

## 2019-04-07 ENCOUNTER — Other Ambulatory Visit: Payer: Self-pay | Admitting: Student in an Organized Health Care Education/Training Program

## 2019-04-07 ENCOUNTER — Other Ambulatory Visit: Payer: Self-pay

## 2019-04-07 ENCOUNTER — Ambulatory Visit (HOSPITAL_COMMUNITY)
Admission: EM | Admit: 2019-04-07 | Discharge: 2019-04-07 | Disposition: A | Discharge status: Home / Self Care | Payer: Medicaid Other | Attending: Urgent Care | Admitting: Urgent Care

## 2019-04-07 ENCOUNTER — Telehealth: Payer: Self-pay | Admitting: Family Medicine

## 2019-04-07 DIAGNOSIS — R1013 Epigastric pain: Secondary | ICD-10-CM | POA: Diagnosis not present

## 2019-04-07 DIAGNOSIS — I252 Old myocardial infarction: Secondary | ICD-10-CM

## 2019-04-07 DIAGNOSIS — G8929 Other chronic pain: Secondary | ICD-10-CM

## 2019-04-07 DIAGNOSIS — Z760 Encounter for issue of repeat prescription: Secondary | ICD-10-CM | POA: Diagnosis not present

## 2019-04-07 DIAGNOSIS — K219 Gastro-esophageal reflux disease without esophagitis: Secondary | ICD-10-CM | POA: Diagnosis not present

## 2019-04-07 MED ORDER — FAMOTIDINE 20 MG PO TABS: 20.0000 mg | ORAL_TABLET | Freq: Two times a day (BID) | ORAL | 0 refills | Status: DC

## 2019-04-07 MED ORDER — PANTOPRAZOLE SODIUM 40 MG PO TBEC: 40.0000 mg | DELAYED_RELEASE_TABLET | Freq: Every day | ORAL | 0 refills | Status: DC

## 2019-04-07 NOTE — ED Triage Notes (Signed)
Patient is out of medication and has had burning pain in stomach for 3 days.  Reports pcp sent patient to this clinic  Pantoprazole 40 mg is the medicine he is requesting

## 2019-04-07 NOTE — Discharge Instructions (Addendum)
Please make sure you set up a follow up appointment with your PCP for future medication refills. Make sure you avoid acidic foods and beverages as they can worsen your acid reflux. If your chest pain persists despite taking your medication and watching your diet, then please report to the ER as you would need an evaluation to make sure your heart is ok.

## 2019-04-07 NOTE — ED Provider Notes (Signed)
MRN: [id number] DOB: [birth year]  Subjective:   Aaron Reyes is a 43 y.o. male presenting for 1 day hx of mid-epigastric pain that is a burning type sensation since running out of his medication.  Patient reports that he is very consistent with this medicine and called his doctor's office but was advised to come here as did not have an appointment until November.  He has a history of MI but has had a normal cath (2016).  He has well-controlled diabetes.  Denies chest pain, heart racing, palpitation, shortness of breath, fever, nausea, vomiting.  No current facility-administered medications for this encounter.   Current Outpatient Medications:  .  amitriptyline (ELAVIL) 50 MG tablet, Take 0.5 tablets (25 mg total) by mouth at bedtime., Disp: 30 tablet, Rfl: 2 .  aspirin EC 81 MG tablet, Take 1 tablet (81 mg total) by mouth daily., Disp: 30 tablet, Rfl: 3 .  blood glucose meter kit and supplies KIT, Dispense based on patient and insurance preference. Use up to four times daily as directed. (FOR ICD-9 250.00, 250.01)., Disp: 1 each, Rfl: 0 .  busPIRone (BUSPAR) 15 MG tablet, Take 1 tablet (15 mg total) by mouth 3 (three) times daily., Disp: 90 tablet, Rfl: 5 .  cyclobenzaprine (FLEXERIL) 10 MG tablet, Take 1 tablet (10 mg total) by mouth 2 (two) times daily as needed for muscle spasms., Disp: 20 tablet, Rfl: 0 .  diclofenac sodium (VOLTAREN) 1 % GEL, Apply 4 g topically 4 (four) times daily., Disp: 50 g, Rfl: 0 .  diclofenac sodium (VOLTAREN) 1 % GEL, Apply 4 g topically 4 (four) times daily., Disp: 100 g, Rfl: 1 .  escitalopram (LEXAPRO) 20 MG tablet, Take 1 tablet (20 mg total) by mouth at bedtime., Disp: 30 tablet, Rfl: 5 .  famotidine (PEPCID) 20 MG tablet, Take 1 tablet (20 mg total) by mouth 2 (two) times daily., Disp: 60 tablet, Rfl: 2 .  HYDROcodone-acetaminophen (NORCO/VICODIN) 5-325 MG tablet, Take 1-2 tablets by mouth every 6 (six) hours as needed for severe pain., Disp: 10 tablet,  Rfl: 0 .  ibuprofen (ADVIL,MOTRIN) 800 MG tablet, Take 1 tablet (800 mg total) by mouth every 8 (eight) hours as needed for up to 20 doses. (Patient not taking: Reported on 10/03/2018), Disp: 20 tablet, Rfl: 0 .  lidocaine (LIDODERM) 5 %, Place 1 patch onto the skin daily. Remove & Discard patch within 12 hours or as directed by MD, Disp: 30 patch, Rfl: 0 .  meclizine (ANTIVERT) 25 MG tablet, Take 1 tablet (25 mg total) by mouth 2 (two) times daily as needed for dizziness. (Patient not taking: Reported on 10/03/2018), Disp: 30 tablet, Rfl: 0 .  metFORMIN (GLUCOPHAGE) 500 MG tablet, TAKE 1 TABLET(500 MG) BY MOUTH DAILY WITH BREAKFAST, Disp: 90 tablet, Rfl: 0 .  naproxen (NAPROSYN) 500 MG tablet, Take 1 tablet (500 mg total) by mouth 2 (two) times daily., Disp: 30 tablet, Rfl: 0 .  nitroGLYCERIN (NITROSTAT) 0.4 MG SL tablet, Place 1 tablet (0.4 mg total) under the tongue every 5 (five) minutes as needed for chest pain., Disp: 20 tablet, Rfl: 3 .  OVER THE COUNTER MEDICATION, Take 1 tablet by mouth daily as needed (pain/sore throat). otc - medication for pain/sore throat, Disp: , Rfl:  .  pantoprazole (PROTONIX) 40 MG tablet, TAKE 1 TABLET(40 MG) BY MOUTH DAILY, Disp: 90 tablet, Rfl: 0 .  PRESCRIPTION MEDICATION, , Disp: , Rfl:  .  rosuvastatin (CRESTOR) 20 MG tablet, Take 1 tablet (20 mg total)  by mouth daily., Disp: 30 tablet, Rfl: 0 .  traZODone (DESYREL) 100 MG tablet, Take 2 tablets (200 mg total) by mouth at bedtime., Disp: 180 tablet, Rfl: 1  Facility-Administered Medications Ordered in Other Encounters:  .  hepatitis A virus (PF) vaccine (HAVRIX (PF)) injection 1,440 Units, 1 mL, Intramuscular, Once, Verner Mould, MD   Allergies  Allergen Reactions  . Pork-Derived Products Other (See Comments)    per religious preference    Past Medical History:  Diagnosis Date  . GERD (gastroesophageal reflux disease)   . Kidney stone   . Myocardial infarct ([place])   . Non-cardiac chest pain    . 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 S/P cardiac catheterization 03/2015   cath in [place] and [place], no records,  cath at [facility] normal coronary arteries and normal LV function.  . Testicular mass 03/18/2015     Past Surgical History:  Procedure Laterality Date  . CARDIAC CATHETERIZATION    . CARDIAC CATHETERIZATION     x 2  . CARDIAC CATHETERIZATION N/A 04/11/2015   Procedure: Left Heart Cath and Coronary Angiography;  Surgeon: Belva Crome, MD;  Location: [facility];  Service: Cardiovascular;  Laterality: N/A;  . HERNIA REPAIR      ROS  Objective:   Vitals: BP 114/86 (BP Location: Right Arm)   Pulse 86   Temp 97.6 F (36.4 C) (Oral)   Resp 16   SpO2 96%   Physical Exam Constitutional:      General: He is not in acute distress.    Appearance: Normal appearance. He is well-developed. He is not ill-appearing, toxic-appearing or diaphoretic.  HENT:     Head: Normocephalic and atraumatic.     Right Ear: External ear normal.     Left Ear: External ear normal.     Nose: Nose normal.     Mouth/Throat:     Mouth: Mucous membranes are moist.     Pharynx: Oropharynx is clear.  Eyes:     General: No scleral icterus.    Extraocular Movements: Extraocular movements intact.     Pupils: Pupils are equal, round, and reactive to light.  Cardiovascular:     Rate and Rhythm: Normal rate and regular rhythm.     Heart sounds: Normal heart sounds. No murmur. No friction rub. No gallop.   Pulmonary:     Effort: Pulmonary effort is normal. No respiratory distress.     Breath sounds: Normal breath sounds. No stridor. No wheezing, rhonchi or rales.  Abdominal:     General: Bowel sounds are normal. There is no distension.     Palpations: Abdomen is soft. There is no mass.     Tenderness: There is generalized abdominal tenderness and tenderness in the epigastric area. There is no guarding or rebound.  Skin:    General: Skin is warm and dry.  Neurological:     Mental  Status: He is alert and oriented to person, place, and time.  Psychiatric:        Mood and Affect: Mood normal.        Behavior: Behavior normal.        Thought Content: Thought content normal.    Assessment and Plan :   1. Medication refill   2. Abdominal pain, chronic, epigastric   3. Gastroesophageal reflux disease, unspecified whether esophagitis present   4. History of MI (myocardial infarction)     I refilled patient's pantoprazole, also provided patient with prescription for Pepcid.  Reviewed dietary changes and need to happen for his GERD.  Also counseled patient on strict ER precautions given his history of MI.  Patient is set up follow-up with his PCP for continued refills of his medications. Counseled patient on potential for adverse effects with medications prescribed/recommended today, ER and return-to-clinic precautions discussed, patient verbalized understanding.    Jaynee Eagles, [place] 04/07/19 [phone]

## 2019-04-07 NOTE — Telephone Encounter (Signed)
Pt came in office to request refill of: Pantoprazole  Name of Medication(s): Pantoprazole Last date of OV:  12/31/2018 Pharmacy:  [facility]  Will route refill request to [employer].  Discussed with patient policy to call pharmacy for future refills.  Also, discussed refills may take up to 48 hours to approve or deny.  Aaron Reyes

## 2019-04-08 MED ORDER — PANTOPRAZOLE SODIUM 40 MG PO TBEC: 40.0000 mg | DELAYED_RELEASE_TABLET | Freq: Every day | ORAL | 0 refills | Status: DC

## 2019-04-21 ENCOUNTER — Telehealth: Payer: Self-pay

## 2019-04-21 NOTE — Telephone Encounter (Signed)
Came in with pt. Asked for flu shot as well. Salvatore Marvel, CMA

## 2019-07-13 ENCOUNTER — Ambulatory Visit (INDEPENDENT_AMBULATORY_CARE_PROVIDER_SITE_OTHER): Payer: Medicaid Other | Admitting: Family Medicine

## 2019-07-13 ENCOUNTER — Encounter: Payer: Self-pay | Admitting: Family Medicine

## 2019-07-13 ENCOUNTER — Other Ambulatory Visit: Payer: Self-pay

## 2019-07-13 VITALS — BP 102/62 | HR 79 | Wt 144.2 lb

## 2019-07-13 DIAGNOSIS — Z789 Other specified health status: Secondary | ICD-10-CM

## 2019-07-13 DIAGNOSIS — E119 Type 2 diabetes mellitus without complications: Secondary | ICD-10-CM | POA: Diagnosis not present

## 2019-07-13 DIAGNOSIS — M533 Sacrococcygeal disorders, not elsewhere classified: Secondary | ICD-10-CM | POA: Insufficient documentation

## 2019-07-13 DIAGNOSIS — R7309 Other abnormal glucose: Secondary | ICD-10-CM | POA: Diagnosis present

## 2019-07-13 LAB — POCT GLYCOSYLATED HEMOGLOBIN (HGB A1C): HbA1c, POC (controlled diabetic range): 8.3 % — AB (ref 0.0–7.0)

## 2019-07-13 MED ORDER — ROSUVASTATIN CALCIUM 20 MG PO TABS: 20.0000 mg | ORAL_TABLET | Freq: Every day | ORAL | 3 refills | Status: DC

## 2019-07-13 MED ORDER — FAMOTIDINE 20 MG PO TABS: 20.0000 mg | ORAL_TABLET | Freq: Two times a day (BID) | ORAL | 2 refills | Status: DC | PRN

## 2019-07-13 MED ORDER — METFORMIN HCL ER 500 MG PO TB24: 1000.0000 mg | ORAL_TABLET | Freq: Every day | ORAL | 3 refills | Status: DC

## 2019-07-13 MED ORDER — PANTOPRAZOLE SODIUM 40 MG PO TBEC: 40.0000 mg | DELAYED_RELEASE_TABLET | Freq: Every day | ORAL | 3 refills | Status: DC

## 2019-07-13 NOTE — Patient Instructions (Signed)
Dear Aaron Reyes [facility],   It was good to see you! Thank you for taking your time to come in to be seen. Today, we discussed the following:   Coccyx pain   It is likely due to repetitive pressure to that region from sitting or lying down for long periods of time. It is very unlikely to be due to an infection   Diabetes   Increase metformin to 1,000 mg every morning with breakfast   Please see attached information and read carefully about diabetes so you are aware of your disease  Please follow up in 3 months for diabetes follow up or sooner for concerning or worsening symptoms.  You are due for the following Health Maintenance items. Please schedule an appointment to address these prior to leaving.  Health Maintenance Due  Topic Date Due  . URINE MICROALBUMIN  01/05/1988    Be well,   Genia Hotter, M.D   [facility] [phone]  *Sign up for MyChart for instant access to your health profile, labs, orders, upcoming appointments or to contact your provider with questions* ----------------------------------------------------------------------------------------          !        .    :                      .               1000                          3           .       .       .       .    01/05/1988         (  )     [phone]  *   MyChart                  * Remus Loffler 'iibrahim 'ahmad hajazi kan min aljayd rwytk! shkrana lak ealaa alwaqt aldhy qadiatih fi alzuhwr. Duncan Dull alyawm ma yly: 'alam aleaseas min almhtml 'an yakun dhlk bsbb aldaght almutakarir ealaa tilk almintaqat min aljulus 'aw alaistilqa' lifatarat tawilat min alzamni. min ghyr almhtml 'an yakun bsbb eadwaa da' alsukari qum biziadat almitfurmin 'iilaa [address] sabah mae wajabat al'iiftar yrja alaitilae ealaa almaelumat almurfaqat walqura'at bieinayat hawl marad alsukari hataa takun ealaa dirayat bimardak yrja almutabaeat khilal 3 'ashhur limutabaeat marad alsukari 'aw qabl dhlk limaerifat al'aerad 'aw tafaqumiha. 'ant mustahiqun lieanasir alsiyanat alsihiyat altaaliati. yrja tahdid maweid limuealajat hadhih qabl almughadarati. alsiyanat alsihiyat almustahaqa almawdue tarikh alaistihqaq . albawl aldaqiq fi 01/05/1988 kun jiidin, rashyl Carylon Tamburro , (dktur fi altb) Inez Catalina litabi al'usra [phone] * aishtarak fi MyChart lilwusul alfawrii 'iilaa mulfik alsihiyi , walmukhtabarat , wal'awamir , walmawaeid alqadimat 'aw lilaitisal bimuzuadik bi'asyilat *   ===================================================================================

## 2019-07-13 NOTE — Assessment & Plan Note (Signed)
Self limited. Reassurance and expectant management with donut cushion

## 2019-07-13 NOTE — Progress Notes (Signed)
  Subjective  Aaron Reyes is a 44 y.o. male  who presents today for annual physical exam. Also complains of   Coccyx pain Has been occurring for a couple of weeks. Denies pain with bowel movement or urination  Diabetes A1C elevated today.   Objective  Physical Exam BP 102/62   Pulse 79   Wt 144 lb 3.2 oz (65.4 kg)   SpO2 98%   BMI 24.02 kg/m  Well appearing  Male Pain with palpation to coccyx  A1C 8.3  Assessment & Plan    Problem List Items Addressed This Visit      Active Problems   Language barrier   Diabetes mellitus without complication (HCC)    A1C now showing diabetes.  Printed a lot of diabetic information today.  Metformin XR 1000 mg daily  Follow up in 3 months       Relevant Medications   metFORMIN (GLUCOPHAGE-XR) 500 MG 24 hr tablet   rosuvastatin (CRESTOR) 20 MG tablet   Coccyodynia    Self limited. Reassurance and expectant management with donut cushion        Other Visit Diagnoses    Abnormal glucose    -  Primary   Relevant Orders   POCT glycosylated hemoglobin (Hb A1C) (Completed)      Health Maintenance discussed with patient and patient agrees to address when able.  Health Maintenance Due  Topic Date Due  . PNEUMOCOCCAL POLYSACCHARIDE VACCINE AGE 67-64 HIGH RISK  01/05/1980  . FOOT EXAM  01/05/1988  . OPHTHALMOLOGY EXAM  01/05/1988  . URINE MICROALBUMIN  01/05/1988    Melene Plan, M.D.  3:09 PM 07/13/2019

## 2019-07-13 NOTE — Assessment & Plan Note (Signed)
A1C now showing diabetes.  Printed a lot of diabetic information today.  Metformin XR 1000 mg daily  Follow up in 3 months

## 2019-08-16 ENCOUNTER — Emergency Department (HOSPITAL_COMMUNITY): Payer: Medicaid Other

## 2019-08-16 ENCOUNTER — Emergency Department (HOSPITAL_COMMUNITY)
Admission: EM | Admit: 2019-08-16 | Discharge: 2019-08-16 | Disposition: A | Discharge status: Home / Self Care | Payer: Medicaid Other | Attending: Emergency Medicine | Admitting: Emergency Medicine

## 2019-08-16 ENCOUNTER — Encounter (HOSPITAL_COMMUNITY): Payer: Self-pay | Admitting: Emergency Medicine

## 2019-08-16 ENCOUNTER — Other Ambulatory Visit: Payer: Self-pay

## 2019-08-16 DIAGNOSIS — R200 Anesthesia of skin: Secondary | ICD-10-CM | POA: Insufficient documentation

## 2019-08-16 DIAGNOSIS — R61 Generalized hyperhidrosis: Secondary | ICD-10-CM | POA: Diagnosis not present

## 2019-08-16 DIAGNOSIS — R072 Precordial pain: Secondary | ICD-10-CM | POA: Insufficient documentation

## 2019-08-16 DIAGNOSIS — Z7982 Long term (current) use of aspirin: Secondary | ICD-10-CM | POA: Diagnosis not present

## 2019-08-16 DIAGNOSIS — F17210 Nicotine dependence, cigarettes, uncomplicated: Secondary | ICD-10-CM | POA: Insufficient documentation

## 2019-08-16 DIAGNOSIS — R0602 Shortness of breath: Secondary | ICD-10-CM | POA: Diagnosis not present

## 2019-08-16 DIAGNOSIS — Z79899 Other long term (current) drug therapy: Secondary | ICD-10-CM | POA: Insufficient documentation

## 2019-08-16 DIAGNOSIS — R0789 Other chest pain: Secondary | ICD-10-CM | POA: Diagnosis present

## 2019-08-16 DIAGNOSIS — I252 Old myocardial infarction: Secondary | ICD-10-CM | POA: Diagnosis not present

## 2019-08-16 DIAGNOSIS — R079 Chest pain, unspecified: Secondary | ICD-10-CM

## 2019-08-16 LAB — BASIC METABOLIC PANEL
Anion gap: 11 (ref 5–15)
BUN: 18 mg/dL (ref 6–20)
CO2: 20 mmol/L — ABNORMAL LOW (ref 22–32)
Calcium: 8.9 mg/dL (ref 8.9–10.3)
Chloride: 105 mmol/L (ref 98–111)
Creatinine, Ser: 0.67 mg/dL (ref 0.61–1.24)
GFR calc Af Amer: >60 mL/min (ref >60)
GFR calc non Af Amer: >60 mL/min (ref >60)
Glucose, Bld: 180 mg/dL — ABNORMAL HIGH (ref 70–99)
Potassium: 3.9 mmol/L (ref 3.5–5.1)
Sodium: 136 mmol/L (ref 135–145)

## 2019-08-16 LAB — CBC WITH DIFFERENTIAL/PLATELET
Abs Immature Granulocytes: 0.04 10*3/uL (ref 0.00–0.07)
Basophils Absolute: 0 10*3/uL (ref 0.0–0.1)
Basophils Relative: 0 %
Eosinophils Absolute: 0.1 10*3/uL (ref 0.0–0.5)
Eosinophils Relative: 1 %
HCT: 46 % (ref 39.0–52.0)
Hemoglobin: 15.7 g/dL (ref 13.0–17.0)
Immature Granulocytes: 0 %
Lymphocytes Relative: 29 %
Lymphs Abs: 3.2 10*3/uL (ref 0.7–4.0)
MCH: 29.5 pg (ref 26.0–34.0)
MCHC: 34.1 g/dL (ref 30.0–36.0)
MCV: 86.5 fL (ref 80.0–100.0)
Monocytes Absolute: 0.5 10*3/uL (ref 0.1–1.0)
Monocytes Relative: 5 %
Neutro Abs: 7 10*3/uL (ref 1.7–7.7)
Neutrophils Relative %: 65 %
Platelets: 268 10*3/uL (ref 150–400)
RBC: 5.32 MIL/uL (ref 4.22–5.81)
RDW: 12.3 % (ref 11.5–15.5)
WBC: 10.9 10*3/uL — ABNORMAL HIGH (ref 4.0–10.5)
nRBC: 0 % (ref 0.0–0.2)

## 2019-08-16 LAB — TROPONIN I (HIGH SENSITIVITY)
Troponin I (High Sensitivity): 10 ng/L (ref <18)
Troponin I (High Sensitivity): 11 ng/L (ref <18)

## 2019-08-16 LAB — CBG MONITORING, ED: Glucose-Capillary: 171 mg/dL — ABNORMAL HIGH (ref 70–99)

## 2019-08-16 IMAGING — DX DG CHEST 1V PORT
1 series · 1 of 1 positions shown · non-contrast
Comparison: [DATE]

CLINICAL DATA: Chest pain

EXAM:
PORTABLE CHEST 1 VIEW

[chest ap]
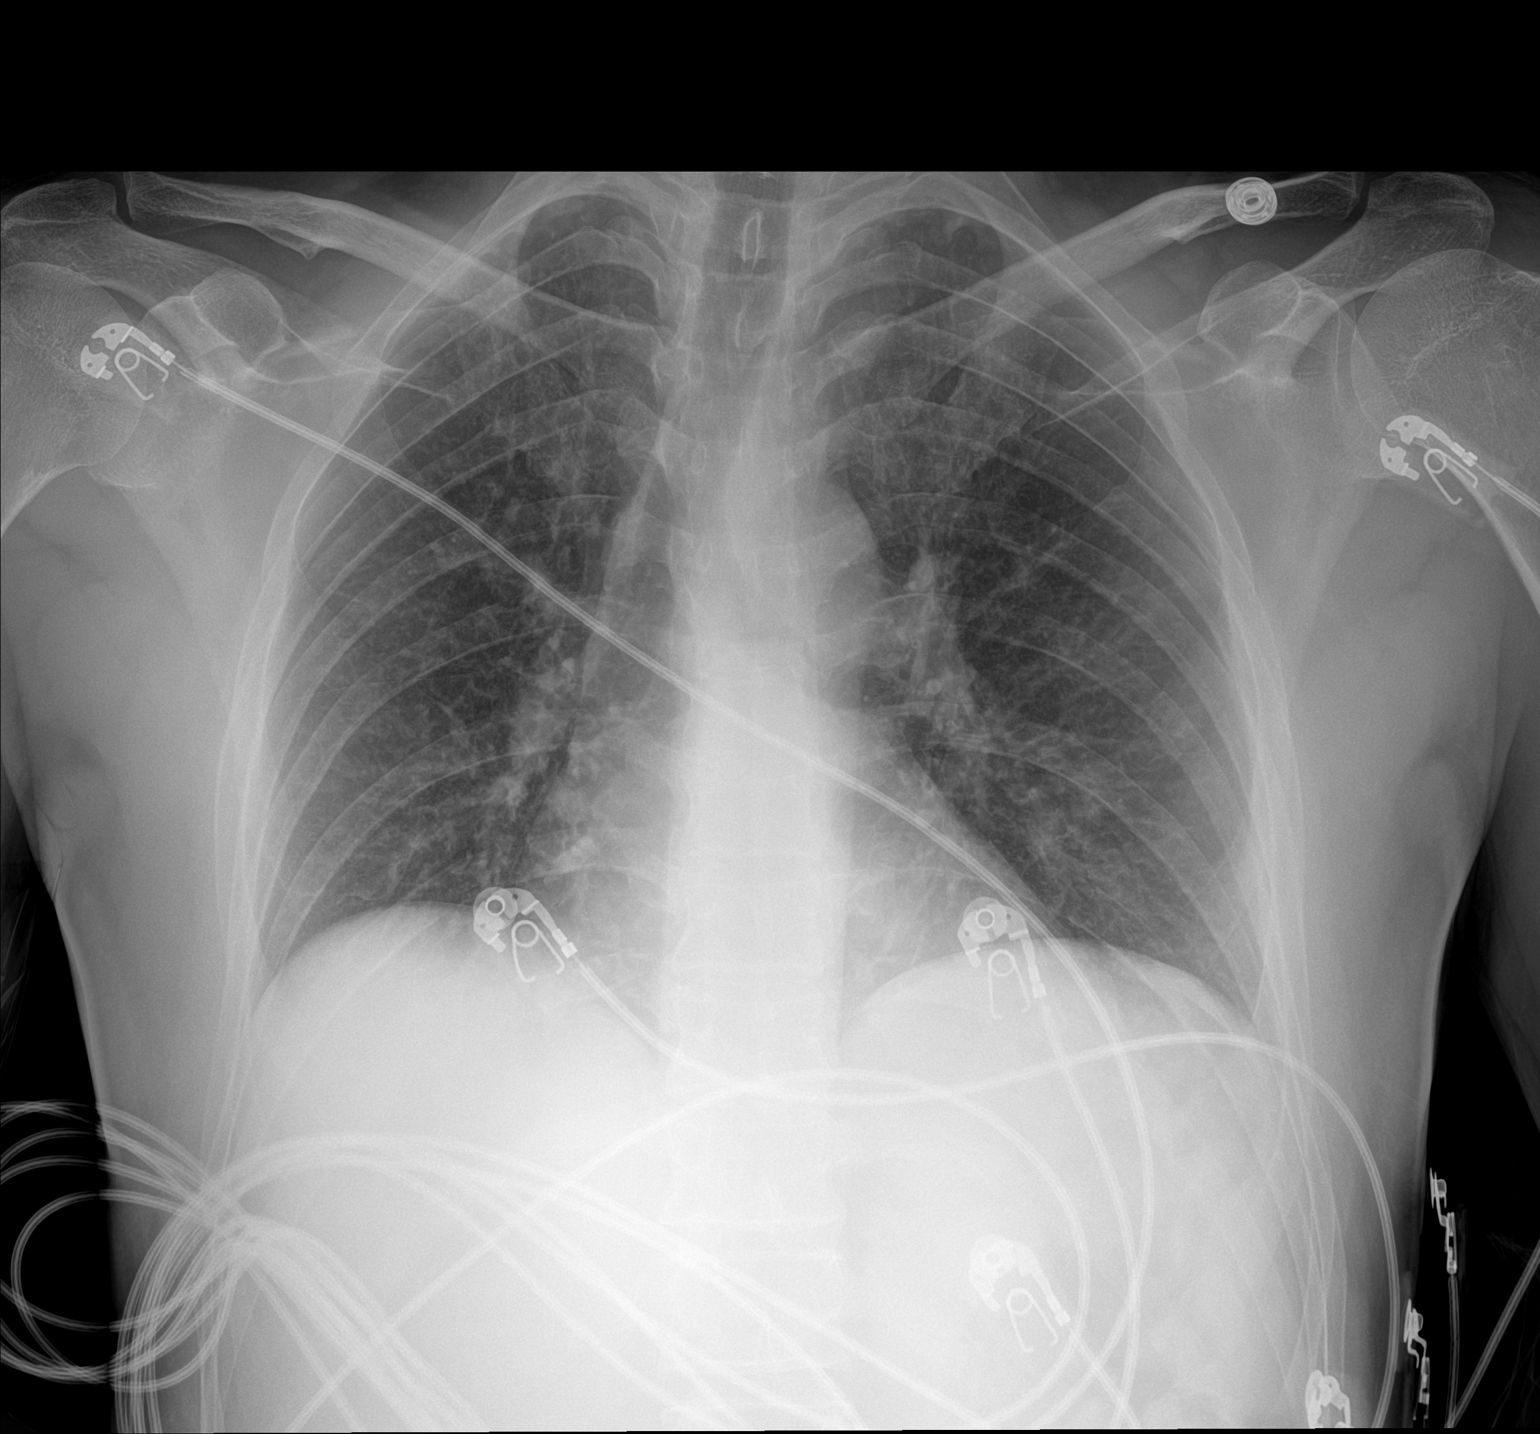

[1 of 1 positions shown; findings below may reference images not displayed]

FINDINGS: The heart size and mediastinal contours are within normal limits.
Both lungs are clear. The visualized skeletal structures are
unremarkable.
IMPRESSION: No active disease.

## 2019-08-16 IMAGING — CT CT ANGIO CHEST
2 of 7 series · 18 of 46 positions shown · IV contrast (APPLIED)
Comparison: [DATE] chest CT. [DATE] and prior radiographs.

CLINICAL DATA: 43-year-old male with acute chest pain and shortness
of breath today.

EXAM:
CT ANGIOGRAPHY CHEST WITH CONTRAST
TECHNIQUE: Multidetector CT imaging of the chest was performed using the
standard protocol during bolus administration of intravenous
contrast. Multiplanar CT image reconstructions and MIPs were
obtained to evaluate the vascular anatomy.
CONTRAST:  60mL OMNIPAQUE IOHEXOL 350 MG/ML SOLN

[Series 7: thins · axial · 0.57mm/px · z∈[-196,+28]mm · 15 of 361 slices shown]
[im 21/361  lung]
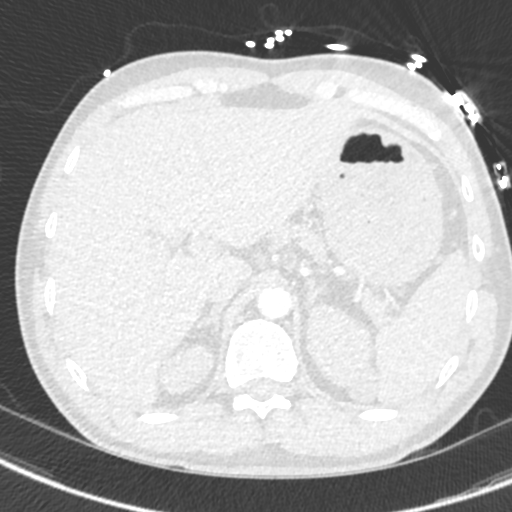
[im 41/361  soft-tissue]
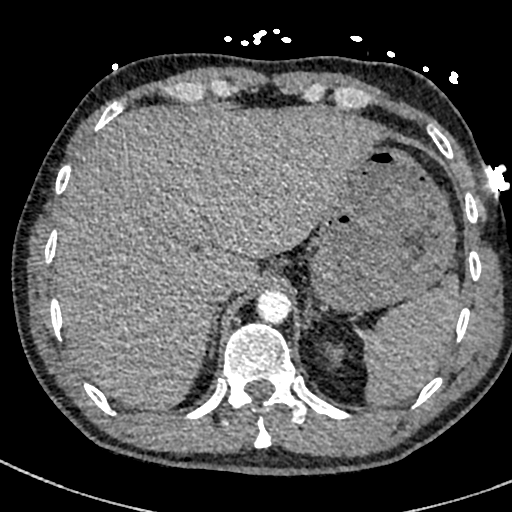
[im 61/361  lung]
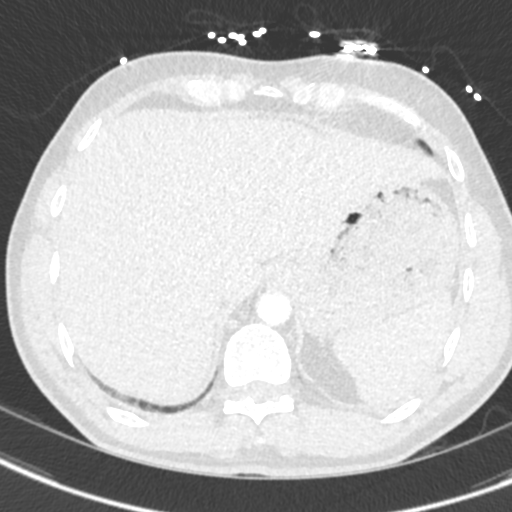
[im 81/361  soft-tissue]
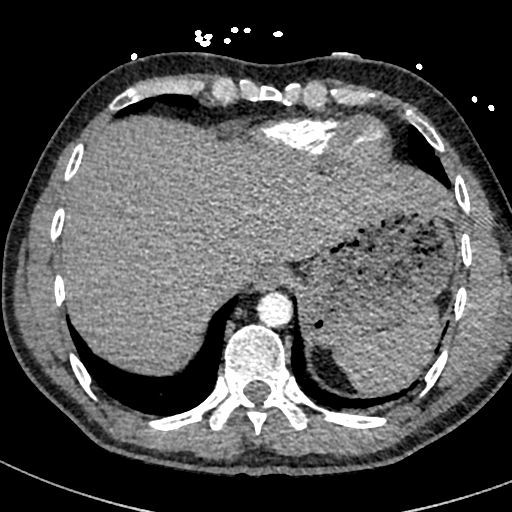
[im 121/361  lung]
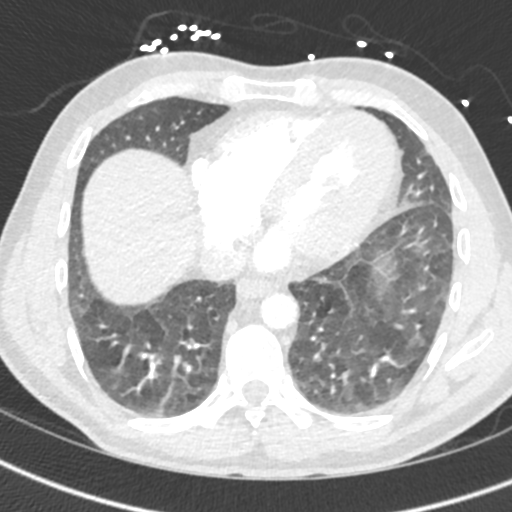
[im 141/361  soft-tissue]
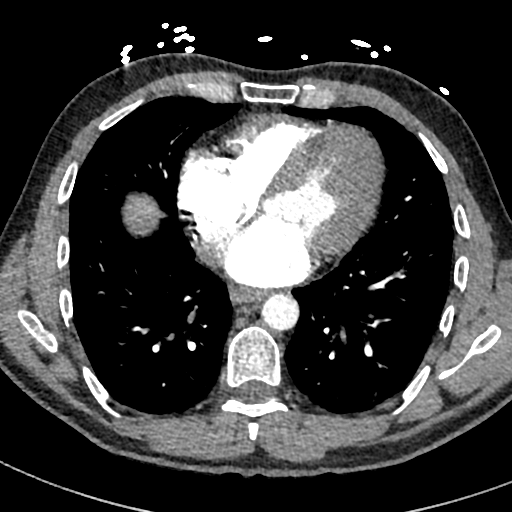
[im 161/361  lung]
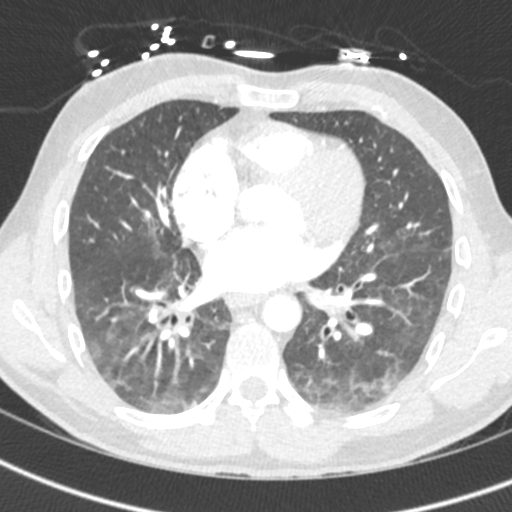
[im 181/361  soft-tissue]
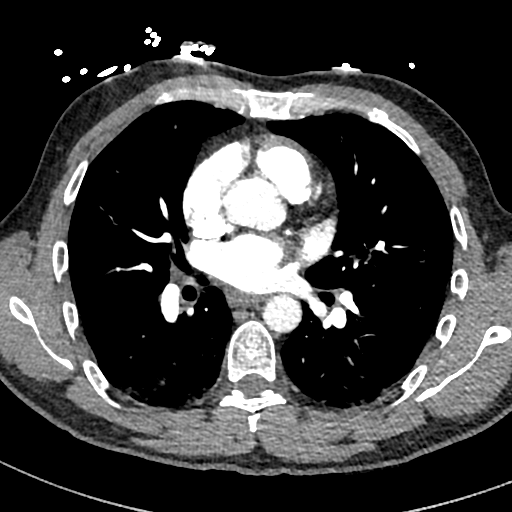
[im 201/361  lung]
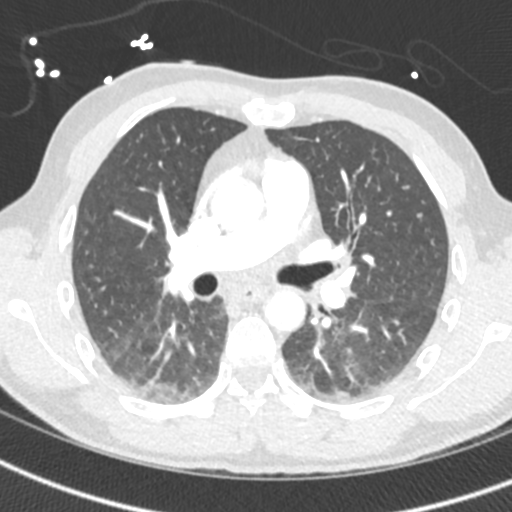
[im 221/361  soft-tissue]
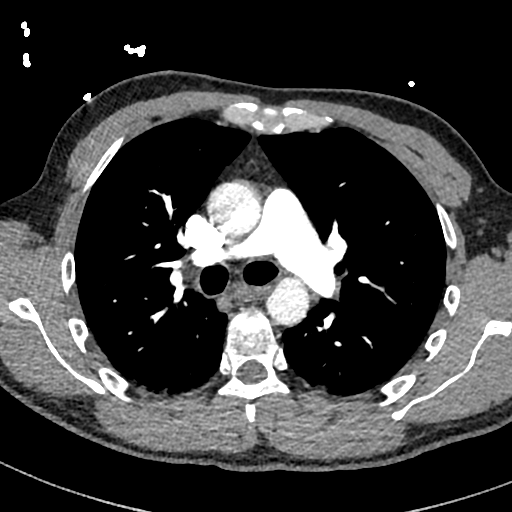
[im 241/361  lung]
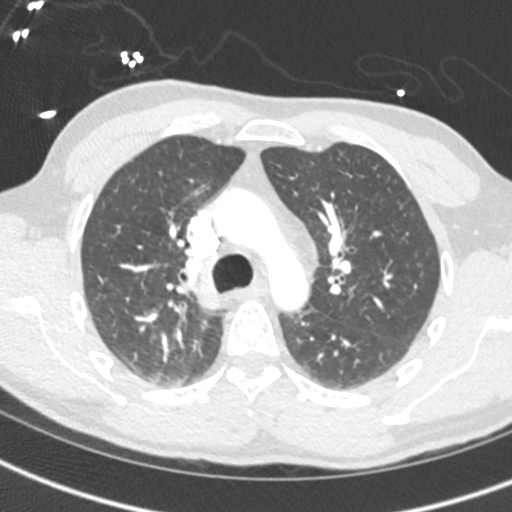
[im 281/361  soft-tissue]
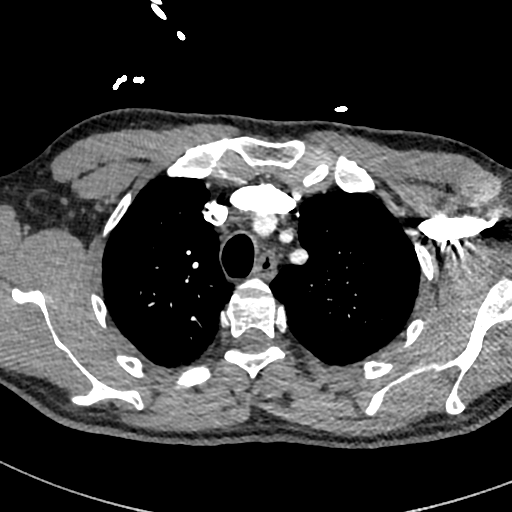
[im 301/361  lung]
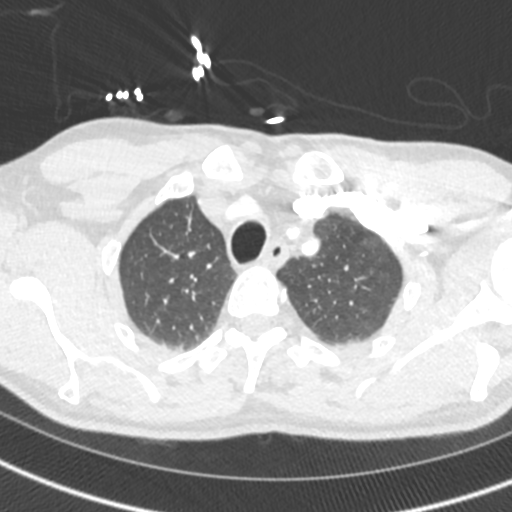
[im 321/361  soft-tissue]
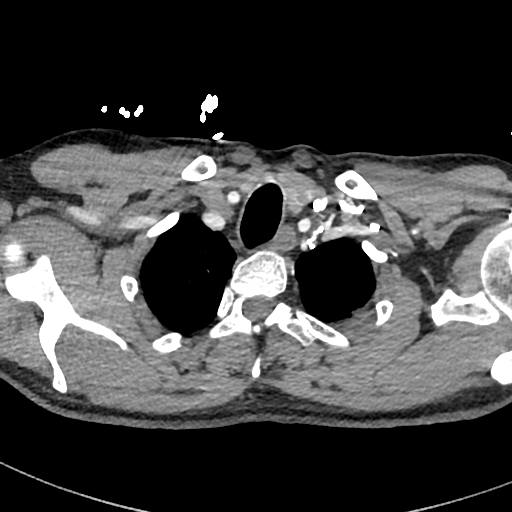
[im 341/361  lung]
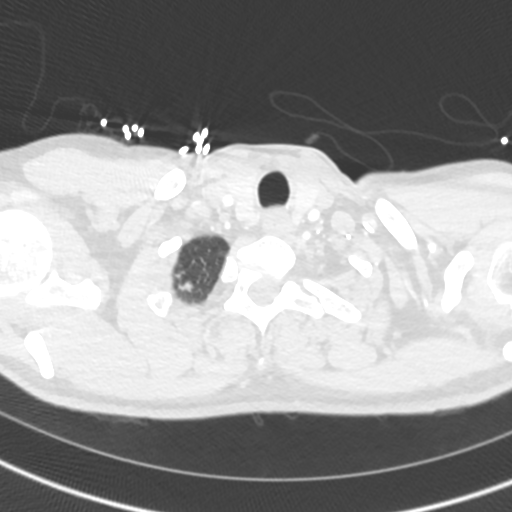

[Series 8: cor · coronal · 0.50mm/px · 3 of 129 slices shown]
[im 33/129  soft-tissue]
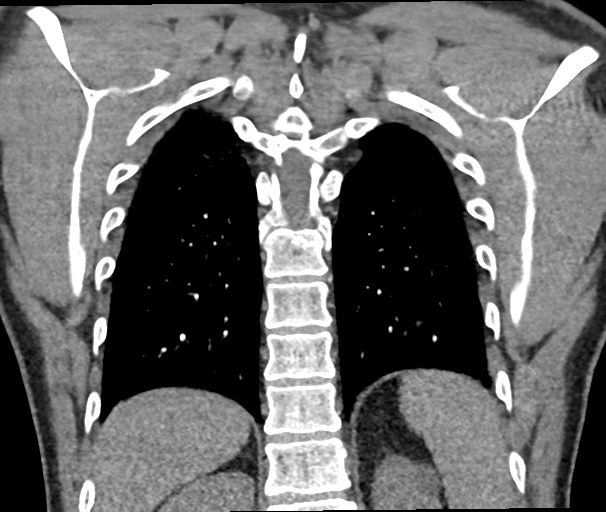
[im 65/129  soft-tissue]
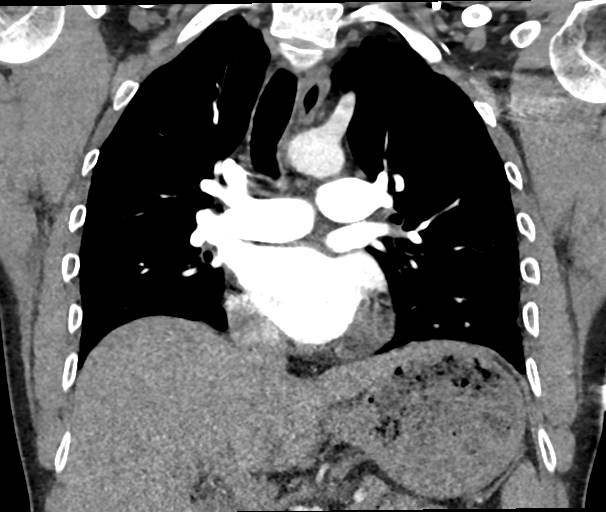
[im 97/129  soft-tissue]
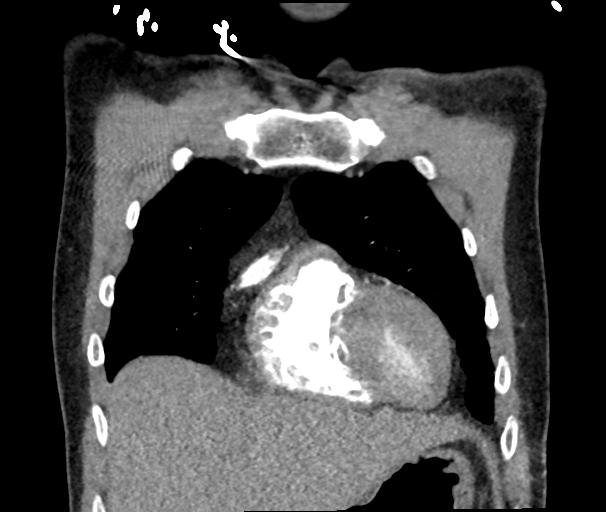

[18 of 46 positions shown; findings below may reference images not displayed]

FINDINGS: Cardiovascular: This is a technically satisfactory study. No
pulmonary emboli are identified. There is no evidence of thoracic
aortic aneurysm or definite dissection. Heart size is normal. No
pericardial effusion noted.

Mediastinum/Nodes: No enlarged mediastinal, hilar, or axillary lymph
nodes. Thyroid gland, trachea, and esophagus demonstrate no
significant findings.

Lungs/Pleura: Scattered bilateral LOWER lobe ground-glass opacities
are noted, some in a mosaic type pattern, but decreased since [T6].

Mild biapical pleural-parenchymal nodularity is unchanged.

Diffuse peribronchial thickening is unchanged.

No airspace disease, consolidation, mass, suspicious nodule, pleural
effusion or pneumothorax.

Upper Abdomen: No acute abnormality.

Musculoskeletal: No acute or suspicious bony abnormalities are
identified. Remote rib fractures identified.

Review of the MIP images confirms the above findings.
IMPRESSION: 1. No evidence of pulmonary emboli or thoracic aortic
aneurysm/dissection.
2. Scattered bilateral LOWER lobe ground-glass opacities, some in a
mosaic type pattern, but decreased since [T6] and may represent
small vessel/small vessel disease. Unchanged diffuse peribronchial
thickening.

## 2019-08-16 MED ORDER — MORPHINE SULFATE (PF) 4 MG/ML IV SOLN
4.0000 mg | Freq: Once | INTRAVENOUS | Status: AC
  Administered 2019-08-16: 08:00:00 4 mg via INTRAVENOUS
  Filled 2019-08-16: qty 1

## 2019-08-16 MED ORDER — SODIUM CHLORIDE 0.9 % IV BOLUS
1000.0000 mL | Freq: Once | INTRAVENOUS | Status: AC
  Administered 2019-08-16: 08:00:00 1000 mL via INTRAVENOUS

## 2019-08-16 MED ORDER — IOHEXOL 350 MG/ML SOLN
80.0000 mL | Freq: Once | INTRAVENOUS | Status: AC | PRN
  Administered 2019-08-16: 10:00:00 60 mL via INTRAVENOUS

## 2019-08-16 MED ORDER — ONDANSETRON HCL 4 MG/2ML IJ SOLN
4.0000 mg | Freq: Once | INTRAMUSCULAR | Status: AC
  Administered 2019-08-16: 08:00:00 4 mg via INTRAVENOUS
  Filled 2019-08-16: qty 2

## 2019-08-16 MED ORDER — ASPIRIN 81 MG PO CHEW
324.0000 mg | CHEWABLE_TABLET | Freq: Once | ORAL | Status: AC
  Administered 2019-08-16: 324 mg via ORAL
  Filled 2019-08-16: qty 4

## 2019-08-16 NOTE — ED Triage Notes (Signed)
Pt arrives to ED from home with complaints of sudden sharp chest pain starting today at 0200 this morning that continues to worsen without relief. Patient states his left arm became numb, back started to hurt, and his tongue became heavy. Patient states that he is now short of breath and chest pain is 10/10.

## 2019-08-16 NOTE — Discharge Instructions (Addendum)
You were seen in the emergency department for pain in your chest and difficulty breathing.  You had blood work EKG chest x-ray and a CAT scan of your chest that did not show any serious findings.  Your symptoms may be related to reflux.  Please use Maalox in between meals and at bedtime.  Continue your regular medications.  Call your primary care doctor to schedule close follow-up.  Return to the emergency department if any worsening or concerning symptoms.

## 2019-08-16 NOTE — ED Notes (Signed)
Patient verbalizes understanding of discharge instructions. Opportunity for questioning and answers were provided. Armband removed by staff, pt discharged from ED.  

## 2019-08-16 NOTE — ED Provider Notes (Signed)
[facility] EMERGENCY DEPARTMENT Provider Note   CSN: [id number] Arrival date & time: 08/16/19  7829     History Chief Complaint  Patient presents with  . Chest Pain  . Shortness of Breath    Aaron Reyes is a 44 y.o. male.  History via Arabic interpreter.  Patient complaining of chest pain started 2 AM and worsened at 6 AM.  He said he has had this before.  He rates it as severe and radiates through to his back.  Associated with left arm numbness.  Shortness of breath.  He says his breathing feels like it is coming from his back and not his lungs anymore.  EMS states that he had a possible syncopal event during transport and when the patient got here he would not respond although it appeared more volitional.  The history is provided by the patient. The history is limited by a language barrier. A language interpreter was used (ipad arabic).  Chest Pain Pain location:  Substernal area Pain quality comment:  Unable to state Pain radiates to:  L arm Pain severity:  Severe Onset quality:  Gradual Duration:  5 hours Timing:  Constant Progression:  Worsening Chronicity:  Recurrent Context: at rest   Relieved by:  None tried Worsened by:  Nothing Ineffective treatments:  None tried Associated symptoms: back pain, diaphoresis, numbness (left arm), shortness of breath and syncope (??)   Associated symptoms: no abdominal pain, no cough, no fever, no headache, no nausea and no vomiting   Risk factors: diabetes mellitus and smoking   Shortness of Breath Associated symptoms: chest pain, diaphoresis and syncope (??)   Associated symptoms: no abdominal pain, no cough, no fever, no headaches, no rash, no sore throat and no vomiting     HPI: A 44 year old patient with a history of treated diabetes presents for evaluation of chest pain. Initial onset of pain was more than 6 hours ago. The patient's chest pain is not worse with exertion. The patient's chest pain is  middle- or left-sided, is not well-localized, is not described as heaviness/pressure/tightness, is not sharp and does radiate to the arms/jaw/neck. The patient does not complain of nausea and denies diaphoresis. The patient has smoked in the past 90 days. The patient has no history of stroke, has no history of peripheral artery disease, has no relevant family history of coronary artery disease (first degree relative at less than age 67), is not hypertensive, has no history of hypercholesterolemia and does not have an elevated BMI (>=30).   Past Medical History:  Diagnosis Date  . Bilateral sensorineural hearing loss 02/07/2017  . Cervicalgia 10/19/2016  . Closed fracture of body of sternum 06/09/2018  . Food insecurity 01/02/2019  . GERD (gastroesophageal reflux disease)   . Kidney stone   . Myocardial infarct ([place])   . Non-cardiac chest pain   . 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 S/P cardiac catheterization 03/2015   cath in [place] and [place], no records,  cath at [facility] normal coronary arteries and normal LV function.  . Testicular mass 03/18/2015    Patient Active Problem List   Diagnosis Date Noted  . Diabetes mellitus without complication ([place]) 56/21/3086  . Coccyodynia 07/13/2019  . Language barrier 01/02/2019  . Tobacco abuse 01/01/2018  . GERD (gastroesophageal reflux disease) 09/04/2016  . Generalized anxiety disorder 01/26/2016  . Post traumatic stress disorder 01/26/2016  . Dysuria 04/26/2015    Past Surgical History:  Procedure Laterality Date  . CARDIAC CATHETERIZATION    .  CARDIAC CATHETERIZATION     x 2  . CARDIAC CATHETERIZATION N/A 04/11/2015   Procedure: Left Heart Cath and Coronary Angiography;  Surgeon: Belva Crome, MD;  Location: [facility];  Service: Cardiovascular;  Laterality: N/A;  . HERNIA REPAIR         Family History  Problem Relation Age of Onset  . CAD Father   . Heart attack Father   . Hypertension Father   . Stroke Father     . Cancer Mother   . Diabetes Mother   . Hypertension Mother     Social History   Tobacco Use  . Smoking status: Current Every Day Smoker    Packs/day: 0.25    Types: Cigarettes  . Smokeless tobacco: Never Used  Substance Use Topics  . Alcohol use: No  . Drug use: No    Home Medications Prior to Admission medications   Medication Sig Start Date End Date Taking? Authorizing Provider  aspirin EC 81 MG tablet Take 1 tablet (81 mg total) by mouth daily. 04/28/15   Isaiah Serge, NP  blood glucose meter kit and supplies KIT Dispense based on patient and insurance preference. Use up to four times daily as directed. (FOR ICD-9 250.00, 250.01). 07/14/18   Martyn Malay, MD  famotidine (PEPCID) 20 MG tablet Take 1 tablet (20 mg total) by mouth 2 (two) times daily as needed for heartburn or indigestion. 07/13/19   Wilber Oliphant, MD  metFORMIN (GLUCOPHAGE-XR) 500 MG 24 hr tablet Take 2 tablets (1,000 mg total) by mouth daily with breakfast. 07/13/19   Wilber Oliphant, MD  pantoprazole (PROTONIX) 40 MG tablet Take 1 tablet (40 mg total) by mouth daily. 07/13/19   Wilber Oliphant, MD  rosuvastatin (CRESTOR) 20 MG tablet Take 1 tablet (20 mg total) by mouth daily. 07/13/19   Wilber Oliphant, MD    Allergies    Pork-derived products  Review of Systems   Review of Systems  Constitutional: Positive for diaphoresis. Negative for fever.  HENT: Negative for sore throat.   Eyes: Negative for visual disturbance.  Respiratory: Positive for shortness of breath. Negative for cough.   Cardiovascular: Positive for chest pain and syncope (??).  Gastrointestinal: Negative for abdominal pain, nausea and vomiting.  Genitourinary: Negative for dysuria.  Musculoskeletal: Positive for back pain.  Skin: Negative for rash.  Neurological: Positive for numbness (left arm). Negative for headaches.    Physical Exam Updated Vital Signs BP 126/86   Pulse 83   Temp 98.2 F (36.8 C) (Oral)   Resp 18   SpO2 96%    Physical Exam Vitals and nursing note reviewed.  Constitutional:      Appearance: He is well-developed.  HENT:     Head: Normocephalic and atraumatic.  Eyes:     Conjunctiva/sclera: Conjunctivae normal.  Cardiovascular:     Rate and Rhythm: Normal rate and regular rhythm.     Pulses:          Carotid pulses are 2+ on the right side and 2+ on the left side.      Radial pulses are 2+ on the right side and 2+ on the left side.       Dorsalis pedis pulses are 2+ on the right side and 2+ on the left side.       Posterior tibial pulses are 2+ on the right side and 2+ on the left side.     Heart sounds: Normal heart sounds. No  murmur.  Pulmonary:     Effort: Pulmonary effort is normal. No respiratory distress.     Breath sounds: Normal breath sounds.  Abdominal:     Palpations: Abdomen is soft.     Tenderness: There is no abdominal tenderness.  Musculoskeletal:        General: Normal range of motion.     Cervical back: Neck supple.     Right lower leg: No tenderness. No edema.     Left lower leg: No tenderness. No edema.  Skin:    General: Skin is warm and dry.     Capillary Refill: Capillary refill takes less than 2 seconds.  Neurological:     General: No focal deficit present.     Mental Status: He is alert.     ED Results / Procedures / Treatments   Labs (all labs ordered are listed, but only abnormal results are displayed) Labs Reviewed  BASIC METABOLIC PANEL - Abnormal; Notable for the following components:      Result Value   CO2 20 (*)    Glucose, Bld 180 (*)    All other components within normal limits  CBC WITH DIFFERENTIAL/PLATELET - Abnormal; Notable for the following components:   WBC 10.9 (*)    All other components within normal limits  CBG MONITORING, ED - Abnormal; Notable for the following components:   Glucose-Capillary 171 (*)    All other components within normal limits  TROPONIN I (HIGH SENSITIVITY)  TROPONIN I (HIGH SENSITIVITY)    EKG EKG  Interpretation  Date/Time:  Sunday August 16 2019 07:11:46 EST Ventricular Rate:  83 PR Interval:    QRS Duration: 84 QT Interval:  352 QTC Calculation: 414 R Axis:   48 Text Interpretation: Sinus rhythm Abnormal R-wave progression, early transition Consider left ventricular hypertrophy Nonspecific T abnrm, anterolateral leads new t wave inversions anterior from prior 5/20 Confirmed by Aletta Edouard [id number]) on 08/16/2019 7:32:17 AM   Radiology CT Angio Chest PE W/Cm &/Or Wo Cm  Result Date: 08/16/2019 CLINICAL DATA:  44 year old male with acute chest pain and shortness of breath today. EXAM: CT ANGIOGRAPHY CHEST WITH CONTRAST TECHNIQUE: Multidetector CT imaging of the chest was performed using the standard protocol during bolus administration of intravenous contrast. Multiplanar CT image reconstructions and MIPs were obtained to evaluate the vascular anatomy. CONTRAST:  72m OMNIPAQUE IOHEXOL 350 MG/ML SOLN COMPARISON:  03/22/2016 chest CT. 08/16/2019 and prior radiographs. FINDINGS: Cardiovascular: This is a technically satisfactory study. No pulmonary emboli are identified. There is no evidence of thoracic aortic aneurysm or definite dissection. Heart size is normal. No pericardial effusion noted. Mediastinum/Nodes: No enlarged mediastinal, hilar, or axillary lymph nodes. Thyroid gland, trachea, and esophagus demonstrate no significant findings. Lungs/Pleura: Scattered bilateral LOWER lobe ground-glass opacities are noted, some in a mosaic type pattern, but decreased since 2019. Mild biapical pleural-parenchymal nodularity is unchanged. Diffuse peribronchial thickening is unchanged. No airspace disease, consolidation, mass, suspicious nodule, pleural effusion or pneumothorax. Upper Abdomen: No acute abnormality. Musculoskeletal: No acute or suspicious bony abnormalities are identified. Remote rib fractures identified. Review of the MIP images confirms the above findings. IMPRESSION: 1. No  evidence of pulmonary emboli or thoracic aortic aneurysm/dissection. 2. Scattered bilateral LOWER lobe ground-glass opacities, some in a mosaic type pattern, but decreased since 2019 and may represent small vessel/small vessel disease. Unchanged diffuse peribronchial thickening. Electronically Signed   By: JMargarette CanadaM.D.   On: 08/16/2019 10:04   DG Chest Port 1 View  Result Date: 08/16/2019 CLINICAL  DATA:  Chest pain EXAM: PORTABLE CHEST 1 VIEW COMPARISON:  02/16/2019 FINDINGS: The heart size and mediastinal contours are within normal limits. Both lungs are clear. The visualized skeletal structures are unremarkable. IMPRESSION: No active disease. Electronically Signed   By: Franchot Gallo M.D.   On: 08/16/2019 08:21    Procedures Procedures (including critical care time)  Medications Ordered in ED Medications  morphine 4 MG/ML injection 4 mg (4 mg Intravenous Given 08/16/19 0745)  ondansetron (ZOFRAN) injection 4 mg (4 mg Intravenous Given 08/16/19 0745)  sodium chloride 0.9 % bolus 1,000 mL (0 mLs Intravenous Stopped 08/16/19 0907)  aspirin chewable tablet 324 mg (324 mg Oral Given 08/16/19 0745)  iohexol (OMNIPAQUE) 350 MG/ML injection 80 mL (60 mLs Intravenous Contrast Given 08/16/19 3149)    ED Course  I have reviewed the triage vital signs and the nursing notes.  Pertinent labs & imaging results that were available during my care of the patient were reviewed by me and considered in my medical decision making (see chart for details).  Clinical Course as of Aug 15 1629  Nancy Fetter Aug 16, 5279  1639 44 year old male no known coronary disease here with chest pain shortness of breath.  Review of prior records show he has been seen for GERD before and reportedly had a clean cath back in the [place].  Differential includes ACS, dissection, PE, GERD, musculoskeletal, pneumothorax   [MB]  0741 Reviewed prior records.  He was admitted in 2019 for syncope chest pain there was mention of concerns  that this was related to anxiety.  He was having difficulty walking.   [MB]  [id number] Cardiac cath 10/16 clean coronaries.   [MB]  [id number] Cardiac echo 7/19 normal function no wall motion abnormalities no valvular disease.   [MB]  [id number] Chest x-ray interpreted by me as no pneumothorax no gross infiltrates.   [MB]  12 CT PE study negative for acute PE.  But, upon some groundglass in the lungs but better than prior.  Delta troponin minimally changed.   [MB]  7026 Reviewed the results via iPad interpreter with the patient.   [MB]    Clinical Course User Index [MB] Hayden Rasmussen, MD   MDM Rules/Calculators/A&P HEAR Score: 3                     Final Clinical Impression(s) / ED Diagnoses Final diagnoses:  Nonspecific chest pain    Rx / DC Orders ED Discharge Orders    None       Hayden Rasmussen, MD 08/16/19 [phone]

## 2019-08-20 ENCOUNTER — Encounter (HOSPITAL_COMMUNITY): Payer: Self-pay | Admitting: *Deleted

## 2019-08-20 ENCOUNTER — Other Ambulatory Visit: Payer: Self-pay

## 2019-08-20 ENCOUNTER — Emergency Department (HOSPITAL_COMMUNITY): Payer: Medicaid Other

## 2019-08-20 ENCOUNTER — Emergency Department (HOSPITAL_COMMUNITY)
Admission: EM | Admit: 2019-08-20 | Discharge: 2019-08-20 | Disposition: A | Discharge status: Home / Self Care | Payer: Medicaid Other | Attending: Emergency Medicine | Admitting: Emergency Medicine

## 2019-08-20 DIAGNOSIS — Z7982 Long term (current) use of aspirin: Secondary | ICD-10-CM | POA: Diagnosis not present

## 2019-08-20 DIAGNOSIS — K219 Gastro-esophageal reflux disease without esophagitis: Secondary | ICD-10-CM | POA: Insufficient documentation

## 2019-08-20 DIAGNOSIS — F17210 Nicotine dependence, cigarettes, uncomplicated: Secondary | ICD-10-CM | POA: Diagnosis not present

## 2019-08-20 DIAGNOSIS — Z7984 Long term (current) use of oral hypoglycemic drugs: Secondary | ICD-10-CM | POA: Diagnosis not present

## 2019-08-20 DIAGNOSIS — R0789 Other chest pain: Secondary | ICD-10-CM

## 2019-08-20 DIAGNOSIS — Z79899 Other long term (current) drug therapy: Secondary | ICD-10-CM | POA: Diagnosis not present

## 2019-08-20 DIAGNOSIS — Z20822 Contact with and (suspected) exposure to covid-19: Secondary | ICD-10-CM | POA: Insufficient documentation

## 2019-08-20 DIAGNOSIS — E119 Type 2 diabetes mellitus without complications: Secondary | ICD-10-CM | POA: Diagnosis not present

## 2019-08-20 LAB — SARS CORONAVIRUS 2 (TAT 6-24 HRS): SARS Coronavirus 2: NEGATIVE

## 2019-08-20 LAB — CBG MONITORING, ED: Glucose-Capillary: 136 mg/dL — ABNORMAL HIGH (ref 70–99)

## 2019-08-20 LAB — BASIC METABOLIC PANEL
Anion gap: 14 (ref 5–15)
BUN: 17 mg/dL (ref 6–20)
CO2: 21 mmol/L — ABNORMAL LOW (ref 22–32)
Calcium: 9.5 mg/dL (ref 8.9–10.3)
Chloride: 107 mmol/L (ref 98–111)
Creatinine, Ser: 1.09 mg/dL (ref 0.61–1.24)
GFR calc Af Amer: >60 mL/min (ref >60)
GFR calc non Af Amer: >60 mL/min (ref >60)
Glucose, Bld: 94 mg/dL (ref 70–99)
Potassium: 3.8 mmol/L (ref 3.5–5.1)
Sodium: 142 mmol/L (ref 135–145)

## 2019-08-20 LAB — CBC
HCT: 44.9 % (ref 39.0–52.0)
Hemoglobin: 15.7 g/dL (ref 13.0–17.0)
MCH: 30 pg (ref 26.0–34.0)
MCHC: 35 g/dL (ref 30.0–36.0)
MCV: 85.9 fL (ref 80.0–100.0)
Platelets: 257 10*3/uL (ref 150–400)
RBC: 5.23 MIL/uL (ref 4.22–5.81)
RDW: 11.9 % (ref 11.5–15.5)
WBC: 6.8 10*3/uL (ref 4.0–10.5)
nRBC: 0 % (ref 0.0–0.2)

## 2019-08-20 LAB — TROPONIN I (HIGH SENSITIVITY)
Troponin I (High Sensitivity): 9 ng/L (ref <18)
Troponin I (High Sensitivity): 8 ng/L (ref <18)

## 2019-08-20 IMAGING — DX DG CHEST 2V
2 series · 2 of 2 positions shown · non-contrast
Comparison: [DATE]

CLINICAL DATA: Chest pain

EXAM:
CHEST - 2 VIEW

[chest pa]
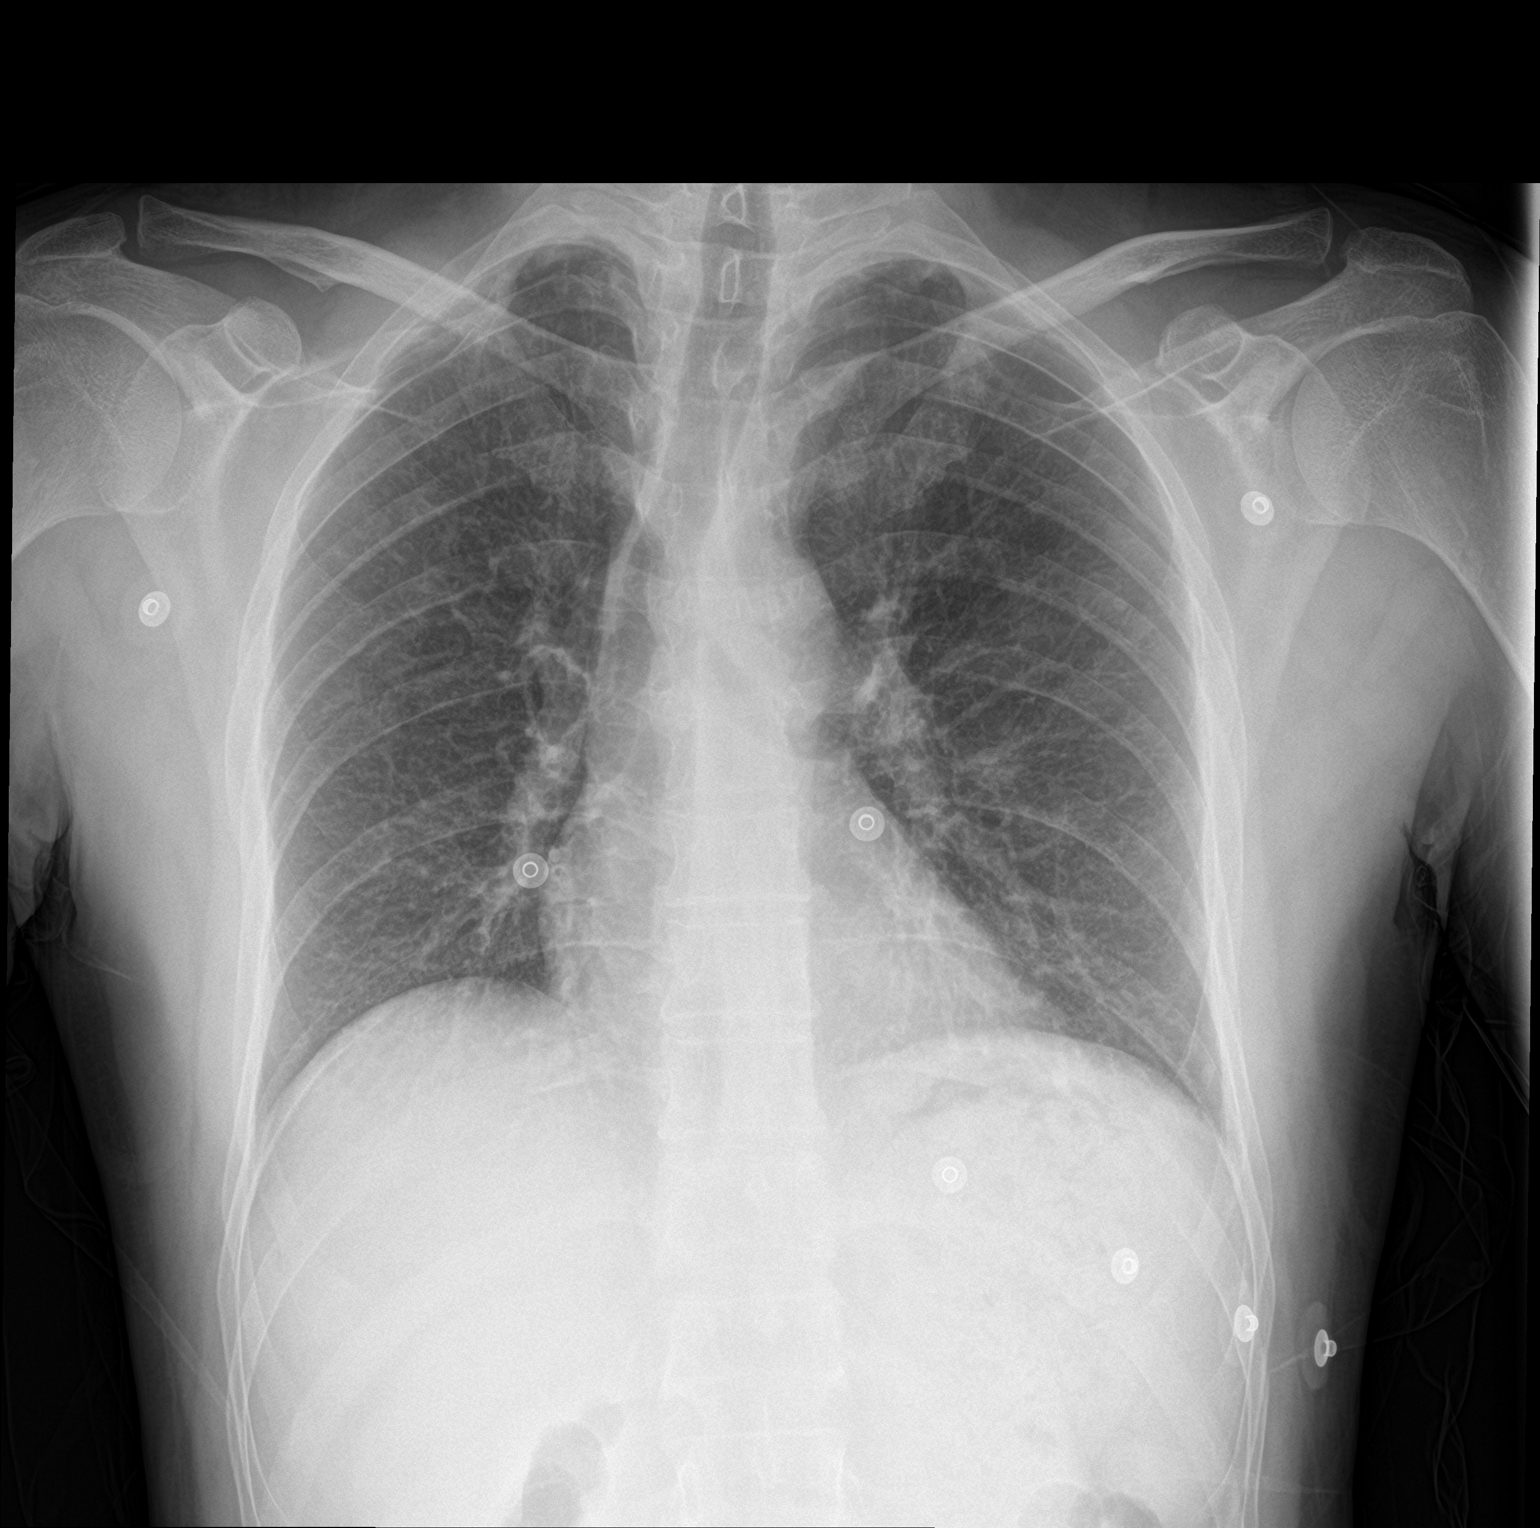

[chest lat]
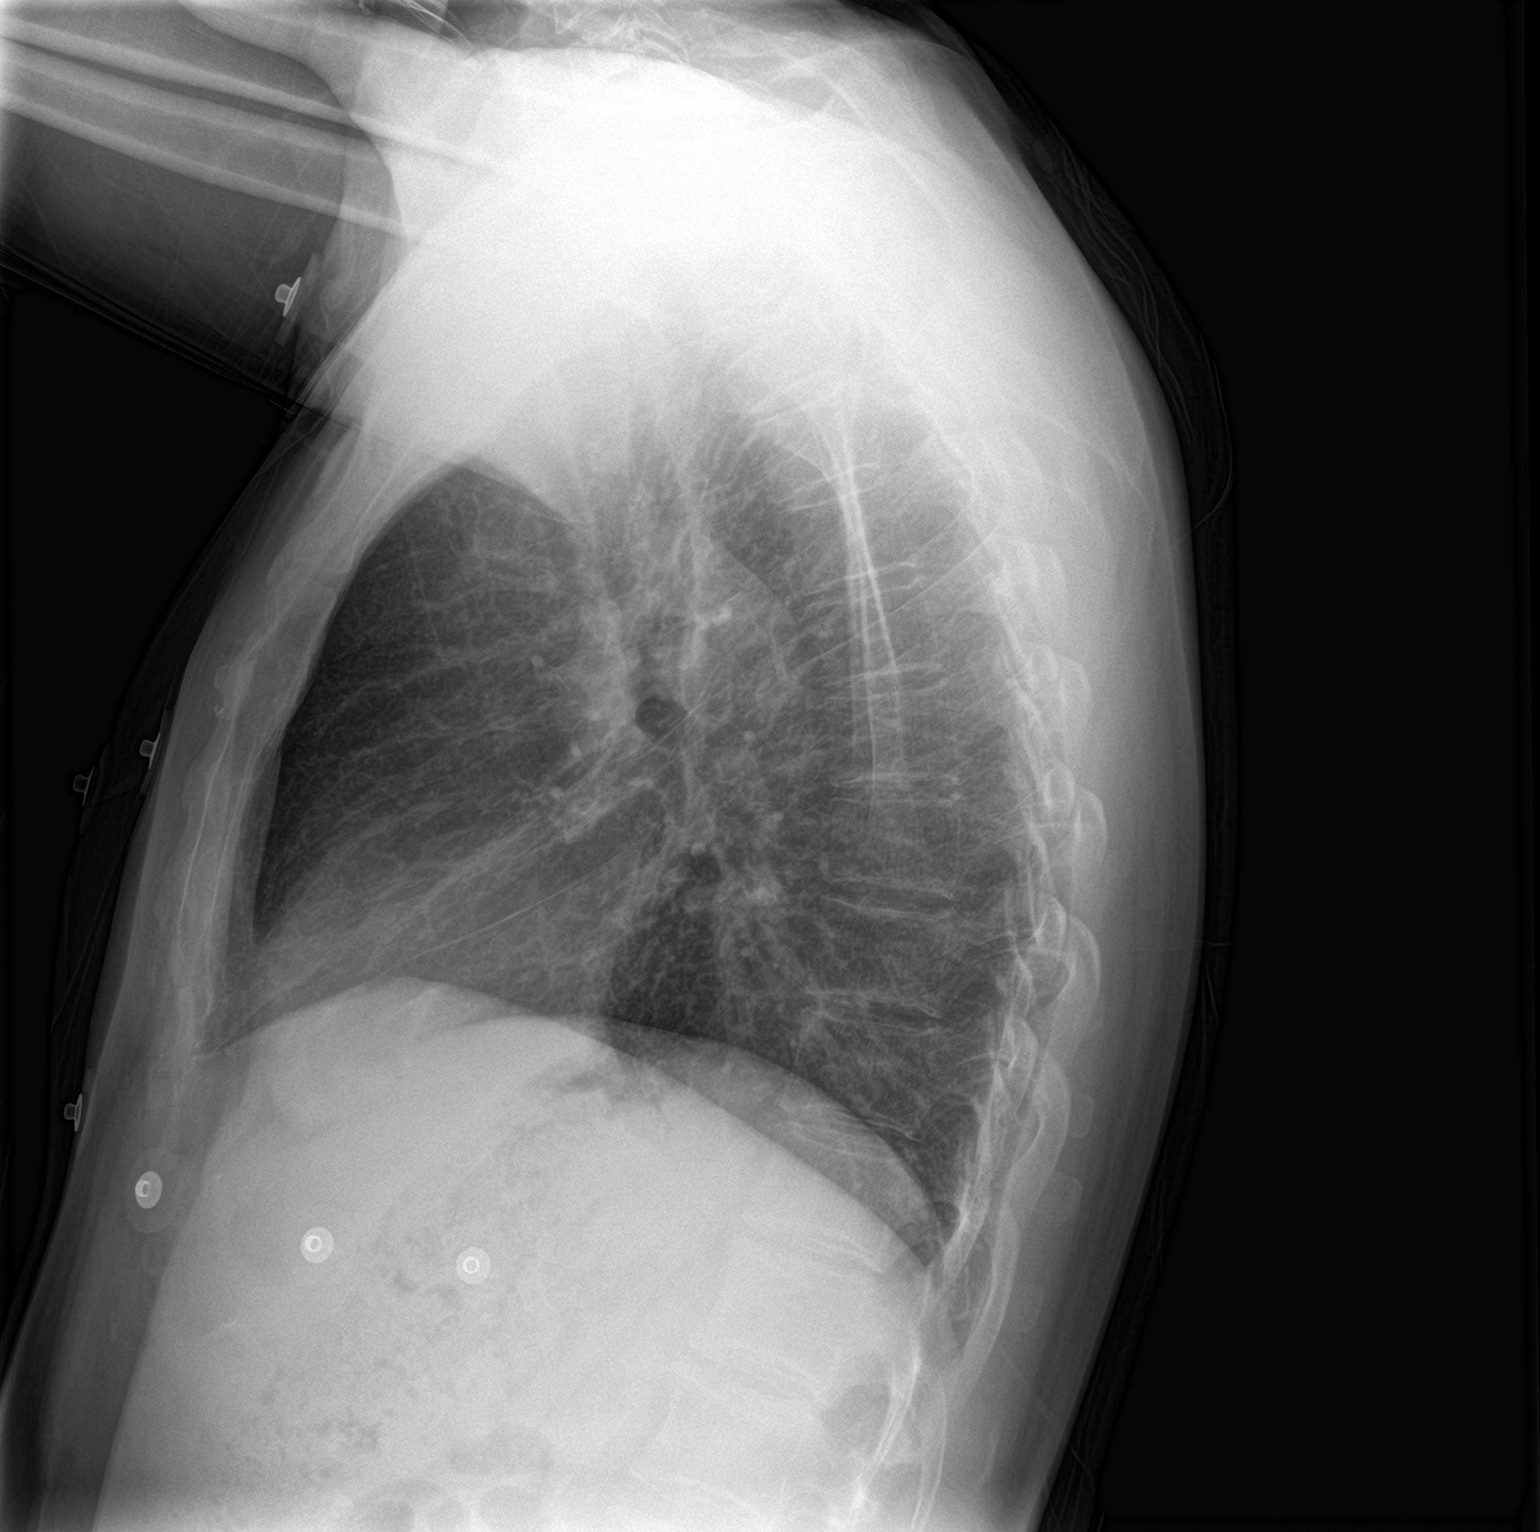

[2 of 2 positions shown; findings below may reference images not displayed]

FINDINGS: The heart size and mediastinal contours are within normal limits.
Both lungs are clear. The visualized skeletal structures are
unremarkable.
IMPRESSION: No active cardiopulmonary disease.

## 2019-08-20 MED ORDER — BENZONATATE 100 MG PO CAPS: 100.0000 mg | ORAL_CAPSULE | Freq: Three times a day (TID) | ORAL | 0 refills | Status: DC

## 2019-08-20 MED ORDER — FLUTICASONE PROPIONATE 50 MCG/ACT NA SUSP: 1.0000 | Freq: Every day | NASAL | 2 refills | Status: DC

## 2019-08-20 MED ORDER — MORPHINE SULFATE (PF) 4 MG/ML IV SOLN
4.0000 mg | Freq: Once | INTRAVENOUS | Status: AC
  Administered 2019-08-20: 4 mg via INTRAVENOUS
  Filled 2019-08-20: qty 1

## 2019-08-20 MED ORDER — SODIUM CHLORIDE 0.9% FLUSH
3.0000 mL | Freq: Once | INTRAVENOUS | Status: AC
  Administered 2019-08-20: 10:00:00 3 mL via INTRAVENOUS

## 2019-08-20 MED ORDER — ALUM & MAG HYDROXIDE-SIMETH 200-200-20 MG/5ML PO SUSP
30.0000 mL | Freq: Once | ORAL | Status: AC
  Administered 2019-08-20: 30 mL via ORAL
  Filled 2019-08-20: qty 30

## 2019-08-20 MED ORDER — ALUMINUM-MAGNESIUM-SIMETHICONE 200-200-20 MG/5ML PO SUSP: 30.0000 mL | Freq: Three times a day (TID) | ORAL | 0 refills | Status: DC

## 2019-08-20 MED ORDER — KETOROLAC TROMETHAMINE 30 MG/ML IJ SOLN
30.0000 mg | Freq: Once | INTRAMUSCULAR | Status: AC
  Administered 2019-08-20: 30 mg via INTRAVENOUS
  Filled 2019-08-20: qty 1

## 2019-08-20 MED ORDER — NAPROXEN 500 MG PO TABS: 500.0000 mg | ORAL_TABLET | Freq: Two times a day (BID) | ORAL | 0 refills | Status: DC

## 2019-08-20 NOTE — ED Provider Notes (Signed)
[facility] EMERGENCY DEPARTMENT Provider Note   CSN: [id number] Arrival date & time: 08/20/19  0250     History Chief Complaint  Patient presents with  . Chest Pain  . abd    Aaron Reyes is a 44 y.o. male with a past medical history of GERD, diabetes, anxiety, presenting to the ED with a chief complaint of chest pain, abdominal pain, rhinorrhea and cough.  States that symptoms began the day after he was discharged from the ER on 08/16/2019.  He was seen and evaluated on that day for ongoing chest pain with negative CTA of the chest for PE, normal troponins and EKG, no evidence of pneumonia.  States that he went home and started having other symptoms.  He has not taken any medications to help with his symptoms.  Denies any sick contacts with similar symptoms.  Patient reports similar chest pain in the past which has been worked up including CTAs, cardiac catheterization in 2016 showing no significant abnormalities, and admitted last year for syncope work-up concerning for anxiety.  HPI     Past Medical History:  Diagnosis Date  . Bilateral sensorineural hearing loss 02/07/2017  . Cervicalgia 10/19/2016  . Closed fracture of body of sternum 06/09/2018  . Food insecurity 01/02/2019  . GERD (gastroesophageal reflux disease)   . Kidney stone   . Myocardial infarct ([place])   . Non-cardiac chest pain   . 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 S/P cardiac catheterization 03/2015   cath in [place] and [place], no records,  cath at [facility] normal coronary arteries and normal LV function.  . Testicular mass 03/18/2015    Patient Active Problem List   Diagnosis Date Noted  . Diabetes mellitus without complication ([place]) 93/79/0240  . Coccyodynia 07/13/2019  . Language barrier 01/02/2019  . Tobacco abuse 01/01/2018  . GERD (gastroesophageal reflux disease) 09/04/2016  . Generalized anxiety disorder 01/26/2016  . Post traumatic stress disorder 01/26/2016  . Dysuria  04/26/2015    Past Surgical History:  Procedure Laterality Date  . CARDIAC CATHETERIZATION    . CARDIAC CATHETERIZATION     x 2  . CARDIAC CATHETERIZATION N/A 04/11/2015   Procedure: Left Heart Cath and Coronary Angiography;  Surgeon: Belva Crome, MD;  Location: [facility];  Service: Cardiovascular;  Laterality: N/A;  . HERNIA REPAIR         Family History  Problem Relation Age of Onset  . CAD Father   . Heart attack Father   . Hypertension Father   . Stroke Father   . Cancer Mother   . Diabetes Mother   . Hypertension Mother     Social History   Tobacco Use  . Smoking status: Current Every Day Smoker    Packs/day: 0.25    Types: Cigarettes  . Smokeless tobacco: Never Used  Substance Use Topics  . Alcohol use: No  . Drug use: No    Home Medications Prior to Admission medications   Medication Sig Start Date End Date Taking? Authorizing Provider  aluminum-magnesium hydroxide-simethicone (MAALOX) [phone] MG/5ML SUSP Take 30 mLs by mouth 4 (four) times daily -  before meals and at bedtime. 08/20/19   Delia Heady, PA-C  aspirin EC 81 MG tablet Take 1 tablet (81 mg total) by mouth daily. 04/28/15   Isaiah Serge, NP  benzonatate (TESSALON) 100 MG capsule Take 1 capsule (100 mg total) by mouth every 8 (eight) hours. 08/20/19   Rylyn Zawistowski, PA-C  blood  glucose meter kit and supplies KIT Dispense based on patient and insurance preference. Use up to four times daily as directed. (FOR ICD-9 250.00, 250.01). 07/14/18   Martyn Malay, MD  famotidine (PEPCID) 20 MG tablet Take 1 tablet (20 mg total) by mouth 2 (two) times daily as needed for heartburn or indigestion. 07/13/19   Wilber Oliphant, MD  fluticasone (FLONASE) 50 MCG/ACT nasal spray Place 1 spray into both nostrils daily. 08/20/19   Lashannon Bresnan, PA-C  metFORMIN (GLUCOPHAGE-XR) 500 MG 24 hr tablet Take 2 tablets (1,000 mg total) by mouth daily with breakfast. 07/13/19   Wilber Oliphant, MD  naproxen (NAPROSYN) 500 MG  tablet Take 1 tablet (500 mg total) by mouth 2 (two) times daily. 08/20/19   Hartleigh Edmonston, PA-C  pantoprazole (PROTONIX) 40 MG tablet Take 1 tablet (40 mg total) by mouth daily. 07/13/19   Wilber Oliphant, MD  rosuvastatin (CRESTOR) 20 MG tablet Take 1 tablet (20 mg total) by mouth daily. 07/13/19   Wilber Oliphant, MD    Allergies    Pork-derived products  Review of Systems   Review of Systems  Constitutional: Positive for chills. Negative for appetite change and fever.  HENT: Positive for rhinorrhea. Negative for ear pain, sneezing and sore throat.   Eyes: Negative for photophobia and visual disturbance.  Respiratory: Positive for cough. Negative for chest tightness, shortness of breath and wheezing.   Cardiovascular: Positive for chest pain. Negative for palpitations.  Gastrointestinal: Negative for abdominal pain, blood in stool, constipation, diarrhea, nausea and vomiting.  Genitourinary: Negative for dysuria, hematuria and urgency.  Musculoskeletal: Negative for myalgias.  Skin: Negative for rash.  Neurological: Negative for dizziness, weakness and light-headedness.    Physical Exam Updated Vital Signs BP 121/89   Pulse 76   Temp 99.1 F (37.3 C) (Oral)   Resp 19   SpO2 97%   Physical Exam Vitals and nursing note reviewed.  Constitutional:      General: He is not in acute distress.    Appearance: He is well-developed.     Comments: Anxious.  HENT:     Head: Normocephalic and atraumatic.     Nose: Nose normal.  Eyes:     General: No scleral icterus.       Right eye: No discharge.        Left eye: No discharge.     Conjunctiva/sclera: Conjunctivae normal.  Cardiovascular:     Rate and Rhythm: Normal rate and regular rhythm.     Heart sounds: Normal heart sounds. No murmur. No friction rub. No gallop.   Pulmonary:     Effort: Pulmonary effort is normal. No respiratory distress.     Breath sounds: Normal breath sounds.  Abdominal:     General: Bowel sounds are normal.  There is no distension.     Palpations: Abdomen is soft.     Tenderness: There is no abdominal tenderness. There is no guarding.  Musculoskeletal:        General: Normal range of motion.     Cervical back: Normal range of motion and neck supple.     Right lower leg: No edema.     Left lower leg: No edema.     Comments: No lower extremity edema, erythema or calf tenderness bilaterally.  Skin:    General: Skin is warm and dry.     Findings: No rash.  Neurological:     Mental Status: He is alert.     Motor: No  abnormal muscle tone.     Coordination: Coordination normal.     ED Results / Procedures / Treatments   Labs (all labs ordered are listed, but only abnormal results are displayed) Labs Reviewed  BASIC METABOLIC PANEL - Abnormal; Notable for the following components:      Result Value   CO2 21 (*)    All other components within normal limits  SARS CORONAVIRUS 2 (TAT 6-24 HRS)  CBC  CBG MONITORING, ED  TROPONIN I (HIGH SENSITIVITY)  TROPONIN I (HIGH SENSITIVITY)    EKG EKG Interpretation  Date/Time:  Thursday August 20 2019 02:52:36 EST Ventricular Rate:  104 PR Interval:  122 QRS Duration: 90 QT Interval:  344 QTC Calculation: 452 R Axis:   63 Text Interpretation: Sinus tachycardia Right atrial enlargement Left ventricular hypertrophy with repolarization abnormality ( Sokolow-Lyon ) Abnormal ECG No significant change was found Confirmed by Gerlene Fee [phone]) on 08/20/2019 7:39:53 AM   Radiology DG Chest 2 View  Result Date: 08/20/2019 CLINICAL DATA:  Chest pain EXAM: CHEST - 2 VIEW COMPARISON:  August 16, 2019 FINDINGS: The heart size and mediastinal contours are within normal limits. Both lungs are clear. The visualized skeletal structures are unremarkable. IMPRESSION: No active cardiopulmonary disease. Electronically Signed   By: Prudencio Pair M.D.   On: 08/20/2019 04:16    Procedures Procedures (including critical care time)  Medications Ordered in  ED Medications  alum & mag hydroxide-simeth (MAALOX/MYLANTA) 200-200-20 MG/5ML suspension 30 mL (has no administration in time range)  sodium chloride flush (NS) 0.9 % injection 3 mL (3 mLs Intravenous Given 08/20/19 0957)  morphine 4 MG/ML injection 4 mg (4 mg Intravenous Given 08/20/19 0956)  ketorolac (TORADOL) 30 MG/ML injection 30 mg (30 mg Intravenous Given 08/20/19 0957)    ED Course  I have reviewed the triage vital signs and the nursing notes.  Pertinent labs & imaging results that were available during my care of the patient were reviewed by me and considered in my medical decision making (see chart for details).    MDM Rules/Calculators/A&P                      Aaron Reyes [facility] was evaluated in Emergency Department on 08/20/19 for the symptoms described in the history of present illness. He/she was evaluated in the context of the global COVID-19 pandemic, which necessitated consideration that the patient might be at risk for infection with the SARS-CoV-2 virus that causes COVID-19. Institutional protocols and algorithms that pertain to the evaluation of patients at risk for COVID-19 are in a state of rapid change based on information released by regulatory bodies including the CDC and federal and state organizations. These policies and algorithms were followed during the patient's care in the ED.  44 year old male presents to ED with a chief complaint of chest pain, rhinorrhea, cough and abdominal pain.  Symptoms began after his visit on 08/16/2019.  He has not taken any medications at home to help with his symptoms.  Work-up that including CT of the chest, troponins and EKG are all unremarkable.  He has had several visits in the ER for similar symptoms in the past 2 years. He had a clean cardiac catheterization done in 2016 here as well as work-up for chest pain.  Today his vital signs are reassuring, he is not tachycardic, tachypneic or hypoxic.  No lower extremity edema, erythema or calf  tenderness that would concern me for DVT. I was able to review workup  from last week. EKG today without any changes from prior tracings.  Chest x-ray without any concerning findings.  BMP, CBC, initial delta troponin are both negative.  Will give pain medication and reassess.  Patient continues to report burning sensation in his chest area.  He is requesting a Covid test as well.  Suspect that his symptoms could be due to Covid versus reflux versus chest wall pain.  With his recent reassuring work-up as well as his work-ups in the past feel that his symptoms are not due to ACS, PE or other emergent cause.  He has not seen a gastroenterologist in the past so we will refer him for potential EGD as needed.  Patient is comfortable with symptomatic treatment at home and GI follow-up.  Return precautions given.  Patient is hemodynamically stable, in NAD, and able to ambulate in the ED. Evaluation does not show pathology that would require ongoing emergent intervention or inpatient treatment. I have personally reviewed and interpreted all lab work and imaging at today's ED visit. I explained the diagnosis to the patient. Pain has been managed and has no complaints prior to discharge. Patient is comfortable with above plan and is stable for discharge at this time. All questions were answered prior to disposition. Strict return precautions for returning to the ED were discussed. Encouraged follow up with PCP.   An After Visit Summary was printed and given to the patient.   Portions of this note were generated with [employer]. Dictation errors may occur despite best attempts at proofreading.  Final Clinical Impression(s) / ED Diagnoses Final diagnoses:  Chest wall pain  Gastroesophageal reflux disease, unspecified whether esophagitis present  Suspected COVID-19 virus infection    Rx / DC Orders ED Discharge Orders         Ordered    aluminum-magnesium hydroxide-simethicone (MAALOX)  [phone] MG/5ML SUSP  3 times daily before meals & bedtime,   Status:  Discontinued     08/20/19 1040    benzonatate (TESSALON) 100 MG capsule  Every 8 hours,   Status:  Discontinued     08/20/19 1040    fluticasone (FLONASE) 50 MCG/ACT nasal spray  Daily,   Status:  Discontinued     08/20/19 1040    naproxen (NAPROSYN) 500 MG tablet  2 times daily,   Status:  Discontinued     08/20/19 1040    aluminum-magnesium hydroxide-simethicone (MAALOX) 200-200-20 MG/5ML SUSP  3 times daily before meals & bedtime     08/20/19 1040    benzonatate (TESSALON) 100 MG capsule  Every 8 hours     08/20/19 1040    fluticasone (FLONASE) 50 MCG/ACT nasal spray  Daily     08/20/19 1040    naproxen (NAPROSYN) 500 MG tablet  2 times daily     08/20/19 [place], Dalonda Simoni, PA-C 08/20/19 1042    Maudie Flakes, MD 08/25/19 1629

## 2019-08-20 NOTE — ED Triage Notes (Signed)
Pt c/o chest and abd pain tonigf he speaks arabic  He was jsut here for the same on 2-28  Vomiting in triage he arrived by gems

## 2019-08-20 NOTE — Discharge Instructions (Signed)
Take the naproxen to help with pain. The Flonase will help with your nose irritation. Take the [facility] as needed for cough. Take the Maalox as needed for reflux. Follow-up with the GI specialist listed below for further evaluation. Follow up with your primary care provider as well. Return to the ED if you start to have worsening chest pain, shortness of breath, leg swelling.

## 2019-08-20 NOTE — ED Notes (Signed)
Pt lying in the floor in triage

## 2019-09-24 ENCOUNTER — Ambulatory Visit (INDEPENDENT_AMBULATORY_CARE_PROVIDER_SITE_OTHER): Payer: Medicaid Other

## 2019-09-24 ENCOUNTER — Ambulatory Visit (HOSPITAL_COMMUNITY)
Admission: EM | Admit: 2019-09-24 | Discharge: 2019-09-24 | Disposition: A | Discharge status: Home / Self Care | Payer: Medicaid Other

## 2019-09-24 ENCOUNTER — Other Ambulatory Visit: Payer: Self-pay

## 2019-09-24 DIAGNOSIS — R0602 Shortness of breath: Secondary | ICD-10-CM

## 2019-09-24 DIAGNOSIS — Z760 Encounter for issue of repeat prescription: Secondary | ICD-10-CM

## 2019-09-24 DIAGNOSIS — R0989 Other specified symptoms and signs involving the circulatory and respiratory systems: Secondary | ICD-10-CM

## 2019-09-24 DIAGNOSIS — K219 Gastro-esophageal reflux disease without esophagitis: Secondary | ICD-10-CM

## 2019-09-24 DIAGNOSIS — F411 Generalized anxiety disorder: Secondary | ICD-10-CM

## 2019-09-24 DIAGNOSIS — R079 Chest pain, unspecified: Secondary | ICD-10-CM | POA: Diagnosis not present

## 2019-09-24 DIAGNOSIS — E782 Mixed hyperlipidemia: Secondary | ICD-10-CM

## 2019-09-24 DIAGNOSIS — R062 Wheezing: Secondary | ICD-10-CM

## 2019-09-24 DIAGNOSIS — E119 Type 2 diabetes mellitus without complications: Secondary | ICD-10-CM

## 2019-09-24 IMAGING — DX DG CHEST 2V
2 series · 2 of 2 positions shown · non-contrast
Comparison: [DATE].

CLINICAL DATA: Chest pain.  Shortness of breath.

EXAM:
CHEST - 2 VIEW

[chest pa]
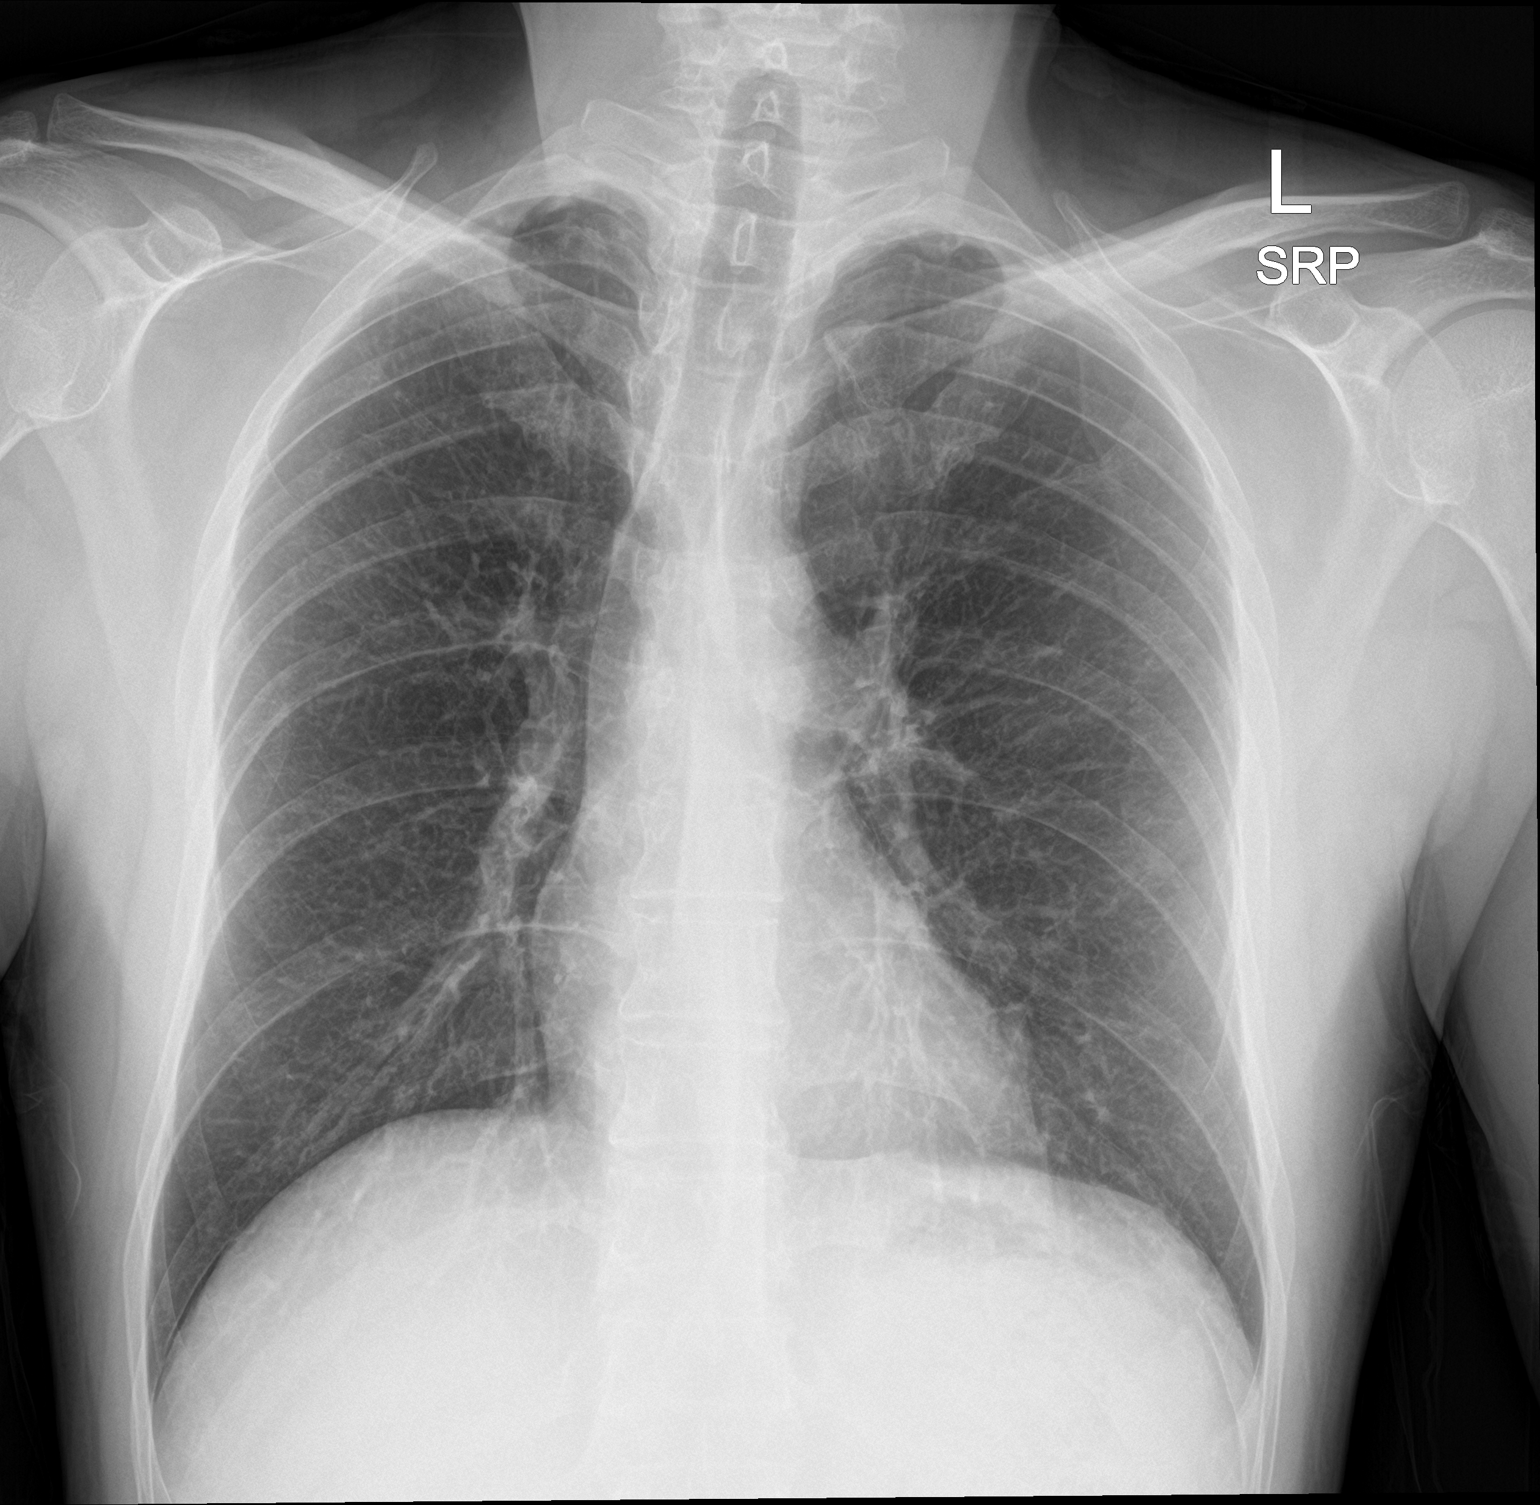

[chest lat]
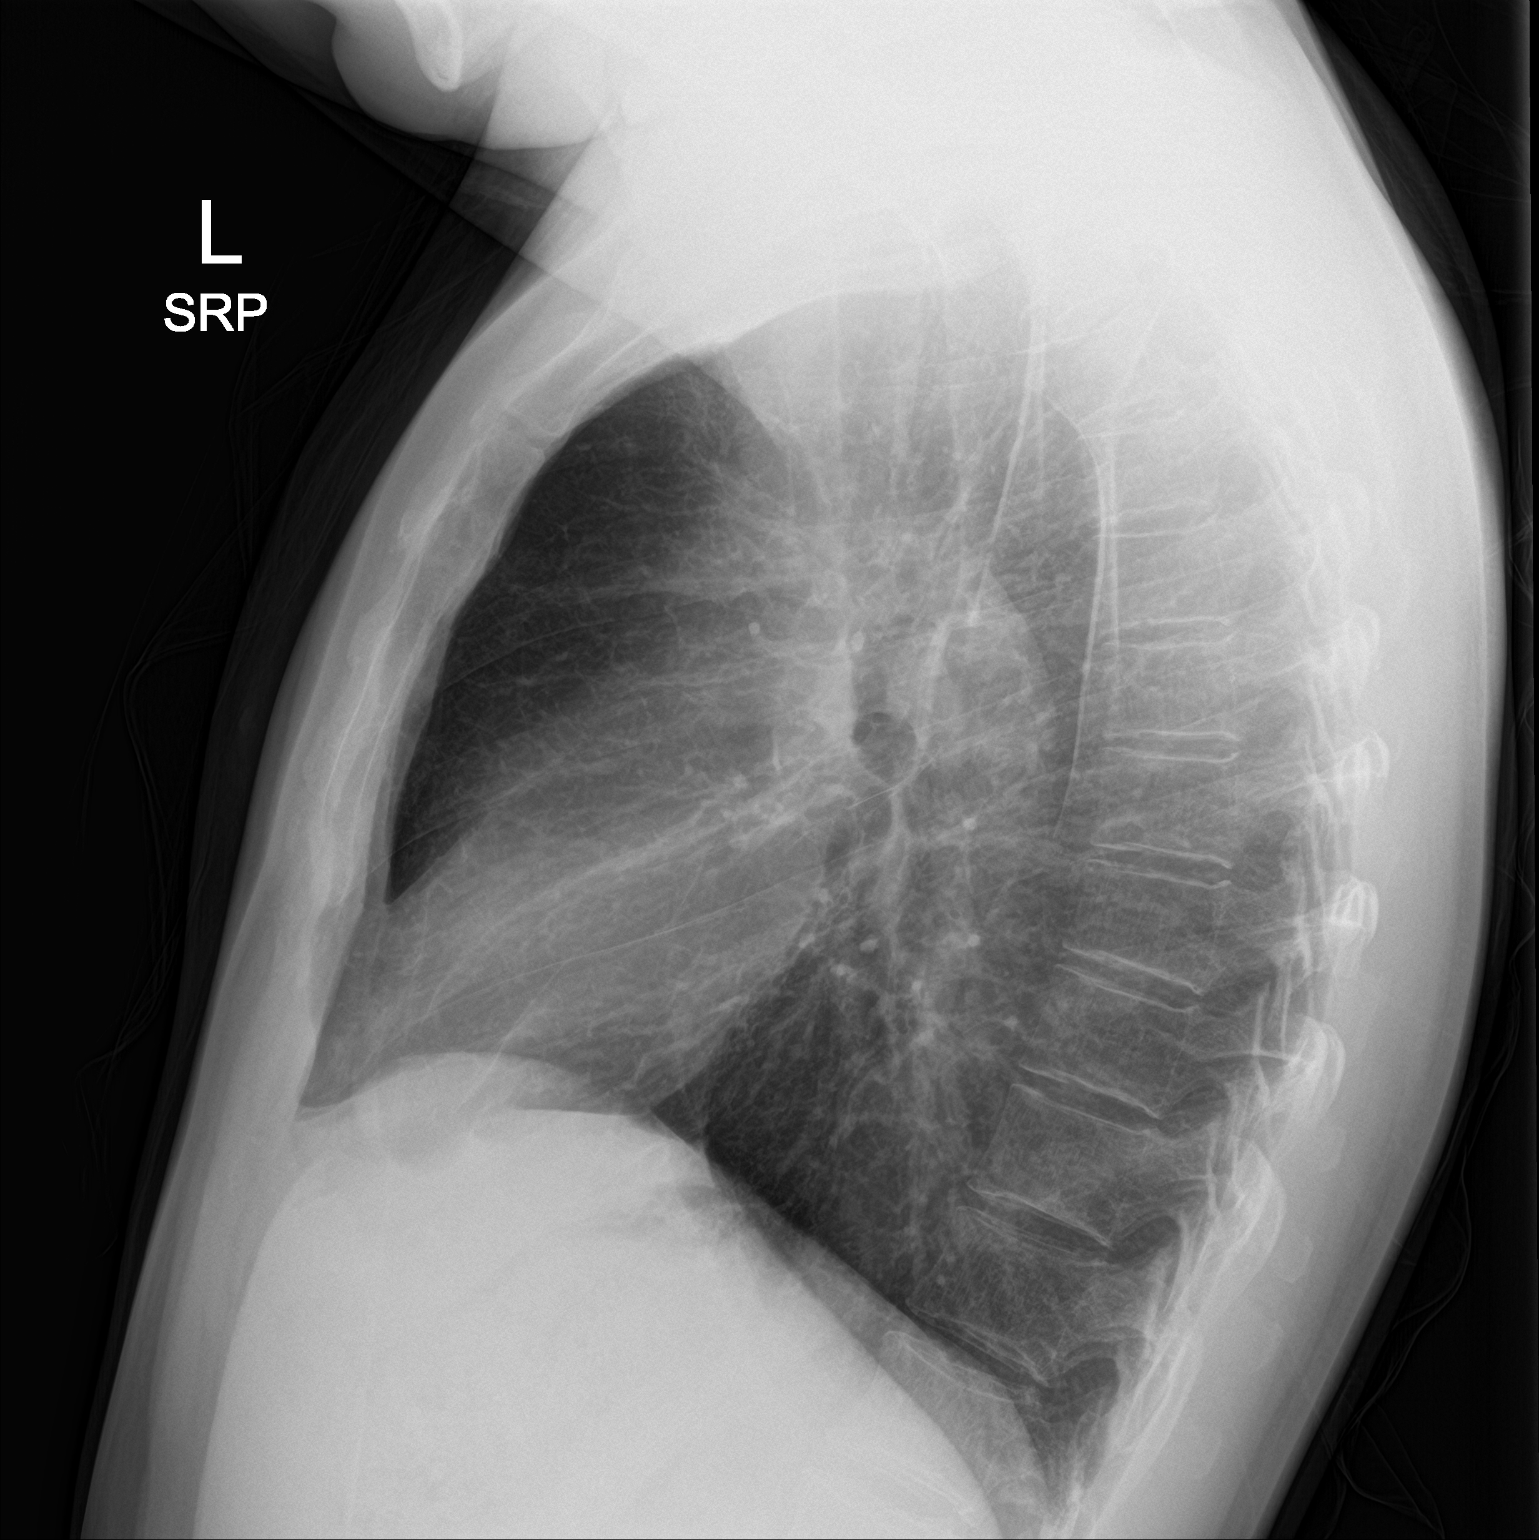

[2 of 2 positions shown; findings below may reference images not displayed]

FINDINGS: Mediastinum and hilar structures normal. Lungs are clear. No pleural
effusion or pneumothorax. Heart size normal. Thoracic spine
scoliosis. No acute bony abnormality.
IMPRESSION: No acute cardiopulmonary disease.  Stable chest from prior exam.

## 2019-09-24 MED ORDER — METFORMIN HCL ER 500 MG PO TB24: 1000.0000 mg | ORAL_TABLET | Freq: Every day | ORAL | 3 refills | Status: DC

## 2019-09-24 MED ORDER — ASPIRIN 81 MG PO CHEW: 81.0000 mg | CHEWABLE_TABLET | Freq: Every day | ORAL | 0 refills | Status: DC

## 2019-09-24 MED ORDER — ROSUVASTATIN CALCIUM 20 MG PO TABS: 20.0000 mg | ORAL_TABLET | Freq: Every day | ORAL | 3 refills | Status: DC

## 2019-09-24 MED ORDER — BLOOD GLUCOSE MONITOR KIT: PACK | 0 refills | Status: DC

## 2019-09-24 MED ORDER — PANTOPRAZOLE SODIUM 40 MG PO TBEC: 40.0000 mg | DELAYED_RELEASE_TABLET | Freq: Every day | ORAL | 3 refills | Status: DC

## 2019-09-24 MED ORDER — NAPROXEN 500 MG PO TABS: 500.0000 mg | ORAL_TABLET | Freq: Two times a day (BID) | ORAL | 2 refills | Status: DC

## 2019-09-24 NOTE — Discharge Instructions (Signed)
I have refilled your medications and they will be waiting for you at the pharmacy.  I would like you to go ahead and make an appointment with your primary care provider so that they can continue your medications and perform the appropriate blood work.  I agree that your chest pain is likely anxiety related. Your EKG was very similar to your last EKG. I would recommend that you follow up with cardiology.  Your chest xray today shows

## 2019-09-24 NOTE — ED Provider Notes (Signed)
[place]    CSN: [id number] Arrival date & time: 09/24/19  1325      History   Chief Complaint Chief Complaint  Patient presents with  . Chest Pain    HPI Aaron Reyes is a 44 y.o. male.   Patient reports that he has been having left central chest pain upon waking up for the last 2 weeks.  Has also been reporting sweating upon waking up also for the last 2 weeks.  Also reports heartburn that has been getting worse, he states he is out of his medications.  Up full.  Work-up was done n chart review it looks like he was seen in the ED for this on 08/20/2019, and a full cardiac work-up was done was negative for cardiac pulmonary disease.  Patient does endorse that he has anxiety, but he does not take medication for this.  He also reports he has diabetes, and has had a heart cath but cannot recall when.  Denies having a pacemaker or defibrillator in place.  This visit was performed with an Arabic interpreter with [employer].  Denies headache, cough, sore throat, nausea, vomiting, diarrhea, chills, body aches, fever, rash, other symptoms.  Upon further chart review, patient has history of GERD, diabetes, anxiety, he is a smoker, language barrier as he primarily speaks Arabic.  Denies having a primary care physician.  ROS Per HPI  The history is provided by the patient.    Past Medical History:  Diagnosis Date  . Bilateral sensorineural hearing loss 02/07/2017  . Cervicalgia 10/19/2016  . Closed fracture of body of sternum 06/09/2018  . Food insecurity 01/02/2019  . GERD (gastroesophageal reflux disease)   . Kidney stone   . Myocardial infarct ([place])   . Non-cardiac chest pain   . 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 S/P cardiac catheterization 03/2015   cath in [place] and [place], no records,  cath at [facility] normal coronary arteries and normal LV function.  . Testicular mass 03/18/2015    Patient Active Problem List   Diagnosis Date Noted  . Diabetes  mellitus without complication ([place]) 32/44/0102  . Coccyodynia 07/13/2019  . Language barrier 01/02/2019  . Tobacco abuse 01/01/2018  . GERD (gastroesophageal reflux disease) 09/04/2016  . Generalized anxiety disorder 01/26/2016  . Post traumatic stress disorder 01/26/2016  . Dysuria 04/26/2015    Past Surgical History:  Procedure Laterality Date  . CARDIAC CATHETERIZATION    . CARDIAC CATHETERIZATION     x 2  . CARDIAC CATHETERIZATION N/A 04/11/2015   Procedure: Left Heart Cath and Coronary Angiography;  Surgeon: Belva Crome, MD;  Location: [facility];  Service: Cardiovascular;  Laterality: N/A;  . HERNIA REPAIR         Home Medications    Prior to Admission medications   Medication Sig Start Date End Date Taking? Authorizing Provider  aluminum-magnesium hydroxide-simethicone (MAALOX) [phone] MG/5ML SUSP Take 30 mLs by mouth 4 (four) times daily -  before meals and at bedtime. 08/20/19  Yes Khatri, Hina, PA-C  aspirin-acetaminophen-caffeine (EXCEDRIN MIGRAINE) [id number] MG tablet Take by mouth every 6 (six) hours as needed for headache.   Yes [provider]  aspirin 81 MG chewable tablet Chew 1 tablet (81 mg total) by mouth daily. 09/24/19   Faustino Congress, NP  benzonatate (TESSALON) 100 MG capsule Take 1 capsule (100 mg total) by mouth every 8 (eight) hours. 08/20/19   Khatri, Hina, PA-C  blood glucose meter kit and supplies KIT  Dispense based on patient and insurance preference. Use up to four times daily as directed. (FOR ICD-9 250.00, 250.01). 09/24/19   Faustino Congress, NP  famotidine (PEPCID) 20 MG tablet Take 1 tablet (20 mg total) by mouth 2 (two) times daily as needed for heartburn or indigestion. 07/13/19   Wilber Oliphant, MD  fluticasone (FLONASE) 50 MCG/ACT nasal spray Place 1 spray into both nostrils daily. 08/20/19   Khatri, Hina, PA-C  metFORMIN (GLUCOPHAGE-XR) 500 MG 24 hr tablet Take 2 tablets (1,000 mg total) by mouth daily with breakfast.  09/24/19   Faustino Congress, NP  naproxen (NAPROSYN) 500 MG tablet Take 1 tablet (500 mg total) by mouth 2 (two) times daily. 09/24/19   Faustino Congress, NP  pantoprazole (PROTONIX) 40 MG tablet Take 1 tablet (40 mg total) by mouth daily. 09/24/19   Faustino Congress, NP  rosuvastatin (CRESTOR) 20 MG tablet Take 1 tablet (20 mg total) by mouth daily. 09/24/19   Faustino Congress, NP    Family History Family History  Problem Relation Age of Onset  . CAD Father   . Heart attack Father   . Hypertension Father   . Stroke Father   . Cancer Mother   . Diabetes Mother   . Hypertension Mother     Social History Social History   Tobacco Use  . Smoking status: Current Every Day Smoker    Packs/day: 0.25    Types: Cigarettes  . Smokeless tobacco: Never Used  Substance Use Topics  . Alcohol use: No  . Drug use: No     Allergies   Pork-derived products   Review of Systems Review of Systems   Physical Exam Triage Vital Signs ED Triage Vitals [09/24/19 1344]  Enc Vitals Group     BP      Pulse      Resp      Temp      Temp src      SpO2      Weight      Height      Head Circumference      Peak Flow      Pain Score 8     Pain Loc      Pain Edu?      Excl. in [place]?    No data found.  Updated Vital Signs There were no vitals taken for this visit.  Visual Acuity Right Eye Distance:   Left Eye Distance:   Bilateral Distance:    Right Eye Near:   Left Eye Near:    Bilateral Near:     Physical Exam Vitals and nursing note reviewed.  Constitutional:      General: He is not in acute distress.    Appearance: Normal appearance. He is well-developed and normal weight. He is not ill-appearing.  HENT:     Head: Normocephalic and atraumatic.     Right Ear: Tympanic membrane normal.     Left Ear: Tympanic membrane normal.     Nose: Nose normal.     Mouth/Throat:     Mouth: Mucous membranes are moist.     Pharynx: Oropharynx is clear.  Eyes:     Extraocular  Movements: Extraocular movements intact.     Conjunctiva/sclera: Conjunctivae normal.     Pupils: Pupils are equal, round, and reactive to light.  Cardiovascular:     Rate and Rhythm: Normal rate and regular rhythm.     Heart sounds: Normal heart sounds. No murmur.  Pulmonary:  Effort: Pulmonary effort is normal. No respiratory distress.     Breath sounds: Normal breath sounds. No stridor. No wheezing, rhonchi or rales.  Chest:     Chest wall: No tenderness.  Abdominal:     General: Bowel sounds are normal. There is no distension.     Palpations: Abdomen is soft. There is no mass.     Tenderness: There is no abdominal tenderness. There is no right CVA tenderness, left CVA tenderness, guarding or rebound.     Hernia: No hernia is present.  Musculoskeletal:        General: Normal range of motion.     Cervical back: Neck supple.  Skin:    General: Skin is warm and dry.     Capillary Refill: Capillary refill takes less than 2 seconds.  Neurological:     General: No focal deficit present.     Mental Status: He is alert and oriented to person, place, and time.  Psychiatric:        Mood and Affect: Mood normal.        Behavior: Behavior normal.        Thought Content: Thought content normal.      UC Treatments / Results  Labs (all labs ordered are listed, but only abnormal results are displayed) Labs Reviewed - No data to display  EKG   Radiology No results found.  Procedures Procedures (including critical care time)  Medications Ordered in UC Medications - No data to display  Initial Impression / Assessment and Plan / UC Course  I have reviewed the triage vital signs and the nursing notes.  Pertinent labs & imaging results that were available during my care of the patient were reviewed by me and considered in my medical decision making (see chart for details).     Anxiety: Patient presents for medication refills.  Discussed that increased anxiety can cause chest  pain, can cause headache, can cause a feeling of panic and impending doom.  Patient agrees that his chest pain is likely anxiety driven.  EKG in office today was similar to his last EKG with no active concern for acute coronary syndrome.  Refilled patient's medications today, aspirin 81 mg once daily but notes, Metformin 500 mg XR 2 tabs with breakfast daily, naproxen 500 mg twice daily as needed pain, prescribed 40 mg every day for GERD, Crestor 20 mg daily for hyperlipidemia, as well as a glucometer kit.  Patient has questions about glucometer and how to use.  Instructed patient that the pharmacy should show him how to do this when he goes to pick it up at the pharmacy.  Discussed with patient that he needs to get back in and see his primary care doctor as they need labs for diabetes, blood pressure, chest pain issues that come and go, and discussed with patient that he may need to start an anxiety medication to help prevent trips to the ER.  Patient was also wheezing with mild rhonchi heard in lung bases, chest x-ray today was negative for infection.  Patient politely declines Zyrtec or other allergy medications.  Declines inhaler today.  Patient verbalizes understanding, thanks to staff today for being so helpful in patient with him, and agrees with treatment plan.  Patient instructed to go to the ER if he is having trouble swallowing, trouble breathing, other concerning symptoms. Final Clinical Impressions(s) / UC Diagnoses   Final diagnoses:  Chest pain, unspecified type   Discharge Instructions   None  ED Prescriptions    Medication Sig Dispense Auth. Provider   aspirin 81 MG chewable tablet Chew 1 tablet (81 mg total) by mouth daily. 30 tablet Faustino Congress, NP   metFORMIN (GLUCOPHAGE-XR) 500 MG 24 hr tablet Take 2 tablets (1,000 mg total) by mouth daily with breakfast. 180 tablet Faustino Congress, NP   naproxen (NAPROSYN) 500 MG tablet Take 1 tablet (500 mg total) by mouth 2 (two)  times daily. 60 tablet Faustino Congress, NP   pantoprazole (PROTONIX) 40 MG tablet Take 1 tablet (40 mg total) by mouth daily. 90 tablet Faustino Congress, NP   rosuvastatin (CRESTOR) 20 MG tablet Take 1 tablet (20 mg total) by mouth daily. 90 tablet Faustino Congress, NP   blood glucose meter kit and supplies KIT Dispense based on patient and insurance preference. Use up to four times daily as directed. (FOR ICD-9 250.00, 250.01). 1 each Faustino Congress, NP     PDMP not reviewed this encounter.   Faustino Congress, NP 09/26/19 1045

## 2019-09-24 NOTE — ED Triage Notes (Signed)
Pt c/o left/central CP upon waking for approx [redacted] weeks along with diaphoresis on waking. Describes CP as tightness. States feel "like I might die, it hurts so badly." Denies n/v, recent SOB, dizziness, blurred vision. States he "had white spots in front of my eyes 5 days ago."   Able to speak full sentences w/o difficulty.  Also c/o midepi-gastric pain and "heart burn". Also c/o decreased hearing bilaterally and wants refill for DM medications and heartburn medications.  Wants Rx for low dose aspirin as well.

## 2019-10-09 ENCOUNTER — Other Ambulatory Visit: Payer: Self-pay

## 2019-10-12 NOTE — Telephone Encounter (Signed)
This patient would be a great patient for the new GERD fundoplication procedure done with Dr. Barron Alvine.  I would like to send him a referral if he is okay with that as he has gone to urgent care several times for chest pain and found to be related to GERD.  [facility] and confirm that they accept Medicaid.  Would you be able to call the patient and ask them if they are interested in going to see  Dr. Barron Alvine for consult.

## 2019-10-20 NOTE — Telephone Encounter (Signed)
Sent Dr. Selena Batten a message with concerns about this phone message. Sunday Spillers, CMA

## 2019-10-22 ENCOUNTER — Other Ambulatory Visit: Payer: Self-pay

## 2019-10-22 ENCOUNTER — Ambulatory Visit (HOSPITAL_COMMUNITY): Payer: Medicaid Other | Attending: Family Medicine

## 2019-10-22 ENCOUNTER — Ambulatory Visit (INDEPENDENT_AMBULATORY_CARE_PROVIDER_SITE_OTHER): Payer: Medicaid Other | Admitting: Family Medicine

## 2019-10-22 VITALS — BP 108/82 | HR 67 | Ht 65.0 in | Wt 135.0 lb

## 2019-10-22 DIAGNOSIS — E119 Type 2 diabetes mellitus without complications: Secondary | ICD-10-CM

## 2019-10-22 DIAGNOSIS — R634 Abnormal weight loss: Secondary | ICD-10-CM | POA: Diagnosis not present

## 2019-10-22 DIAGNOSIS — R079 Chest pain, unspecified: Secondary | ICD-10-CM | POA: Diagnosis not present

## 2019-10-22 DIAGNOSIS — F411 Generalized anxiety disorder: Secondary | ICD-10-CM

## 2019-10-22 LAB — POCT GLYCOSYLATED HEMOGLOBIN (HGB A1C): HbA1c, POC (controlled diabetic range): 5.9 % (ref 0.0–7.0)

## 2019-10-22 MED ORDER — SERTRALINE HCL 50 MG PO TABS: 50.0000 mg | ORAL_TABLET | Freq: Every day | ORAL | 1 refills | Status: DC

## 2019-10-22 MED ORDER — ASPIRIN 81 MG PO CHEW: 81.0000 mg | CHEWABLE_TABLET | Freq: Every day | ORAL | 2 refills | Status: DC

## 2019-10-22 NOTE — Progress Notes (Signed)
SUBJECTIVE:   CHIEF COMPLAINT / HPI: Chest pain  Mr. Aaron Reyes is a 44 year old gentleman presenting with a constellation of symptoms discussed below:   Arabic interpreter via iPad was used for the duration of the visit.  Chest pain: States he has had several years of exertional chest pain however feels it has worsened in severity in the past month or so.  Of note, he has been seen in the ED/urgent care several times for this complaint, 3 times since 07/2019--each time has been attributed to anxiety/GERD/MSK.  States he will have squeezing/pressure-like chest pain whenever he walks >100 meters or lifts heavy weights.  Gets better with rest.  However, he also notes the same chest pain when he is feeling sad or anxious. He had an unremarkable left heart cath in 2016, normal echo in 2019.  Denies getting worse with chest palpation or particular foods.  Feels that his GERD is under control with Protonix daily and Pepcid as needed.  He also notices "wetness where his heart is " in the mornings when he wakes up.   In addition to his concerns above, states he has noticed body aches and that he is losing weight despite eating.  He previously was on BuSpar, Celexa, and seeing a therapist for his anxiety, however stopped due to the Covid pandemic and the medications making him feel dizzy/lightheaded.  He thinks reinitiating some of these measures would be beneficial for him.  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   Reports he also needs a letter for the [facility] so that he can drive again. Previously had a syncopal episode > 1 year ago and would like a letter to say he is medically clear to drive.  PERTINENT  PMH / PSH: GERD, diabetes, anxiety, PTSD, tobacco use  OBJECTIVE:   BP 108/82   Pulse 67   Ht 5\' 5"  (1.651 m)   Wt 135 lb (61.2 kg)   SpO2 99%   BMI 22.47 kg/m   General: Alert, NAD HEENT: NCAT,  MMM Cardiac: RRR no m/g/r, nontender to palpation chest and intercostal regions Lungs: Clear bilaterally, no increased WOB, no wheezing or crackles appreciated Abdomen: soft, non-tender Msk: Moves all extremities spontaneously  Ext: Warm, dry Psych: anxious mood and affect   EKG in the office today: NSR, probable LVH and possible left atrial enlargement. No T wave inversion or pathologic Q waves. Comparable to previous.   ASSESSMENT/PLAN:   Chest pain Chronic (years+), worsening. Differential is broad and etiology likely multifactorial. While his reported chest pain is predominantly exertional, his pretest probability of cardiac involvement is quite low with reassuring EKG in the office today, recent echo WNL, and clean cardiac cath in 2016. RF including well controlled DM and tobacco use (4 cigs/day), already on ASA and statin. Do feel that his chest pain may be related to his underlying anxiety that has been uncontrolled for quite some time.  Additionally, do question if his concerns below as well may be contributing.  Will start Zoloft 50 mg and given resources to establish therapy (previously saw [facility])   Recent unintentional weight loss over several months 6% weight loss since 06/2019 despite reported unchanged eating habits. While it could certainly be unrecognized dietary changes in the setting of uncontrolled anxiety/high stress, I am quite concerned that tuberculosis may be the cause of this with his additional vague symptomatology. Quant-gold was positive x2  in 2016, but never treated (loss to follow up). Recent CXR/CT without cavitary lesions. Discussed this with patient and given HD contact number/location. Will also check TSH and HIV today to ensure not contributing. Recent CBC/BMP 08/2019 unrevealing.   Diabetes mellitus without complication (HCC) H6Y 5.9 today. Well controlled with metformin daily. Continue as is.     Also provided a letter to [facility] for medical clearance. Follow up  in 2 weeks or sooner if needed. Will additionally forward to PCP to help with compliance/care coordination as his language barrier and understanding has certainly impacted his medical care.   Aaron Reyes, [place]

## 2019-10-22 NOTE — Patient Instructions (Signed)
It was wonderful seeing you today. I would like you to go to the health department for evaluation of TB. We will check some labs today and I will let you know these in the next week. Please establish with new therapy and start Zoloft as prescribed.     Therapy and Counseling Resources Most providers on this list will take Medicaid. Patients with commercial insurance or Medicare should contact their insurance company to get a list of in network providers.  [facility]  [address], [address] [place], [place] [address]      [phone]  [facility] [address]  [place], [place] [address] [phone]  [facility] [address]  [place], [place] [address]       [phone]     [facility] [address], [place], [place]  [phone]    [facility] [address] [place], [place] [phone]  [facility]:  [address] [place] [phone]  [facility]:  [address] [place], [place] [phone]  [facility] (under & uninsured) [address]   [place] [phone]    [email or web address]    [facility] [place] -[address]     Phone:  [phone]     [facility]-  [address]  [phone]   [facility] [address]      [place], [place] [phone]  [facility]: [address] [place], [place], [place]  [phone]   [facility] (Spanish therapist) [address]   [place] [address]    [phone]    [facility]   [address],  [place], [place]  [phone]   [facility]  [address] [place]  [phone]  [facility]  [address] [place], [place]        [phone]  [facility] under &  uninsured Guadalupe Guerra, To schedule an appointment call [phone]- [phone] [address], [place] [phone]):  [facility] - Fri from 8 AM - 3 PM  [facility] [address],  (Spanish)   [address], [place]: [phone]) 8:30 - 12; 1 - 2:30  [facility] [employer],  [address] [place], [place]    ([phone]):8:30 - 12; 2 - 3PM  [facility],  [address]  [place]; [phone]):   Mon - Fri 8 AM - 5 PM   24- Hour Availability:  . [facility]   [phone] or [phone]  . [facility] [phone]  [facility]  [phone]   . [facility]  [phone] (after hours)  . [facility]   [phone]  . [place] National Suicide Hotline  [phone] (TALK)  . Call 911 or go to emergency room  . [employer]  [phone]);  [place] [facility] [employer]   . [facility]  [phone]); [place], [place], [place], [place], [place], [place], [place]

## 2019-10-22 NOTE — Assessment & Plan Note (Addendum)
Chronic (years+), worsening. Differential is broad and etiology likely multifactorial. While his reported chest pain is predominantly exertional, his pretest probability of cardiac involvement is quite low with reassuring EKG in the office today, recent echo WNL, and clean cardiac cath in 2016. RF including well controlled DM and tobacco use (4 cigs/day), already on ASA and statin. Do feel that his chest pain may be related to his underlying anxiety that has been uncontrolled for quite some time.  Additionally, do question if his concerns below as well may be contributing.  Will start Zoloft 50 mg and given resources to establish therapy (previously saw [facility])

## 2019-10-23 LAB — LIPID PANEL
Chol/HDL Ratio: 2.4 ratio (ref 0.0–5.0)
Cholesterol, Total: 92 mg/dL — ABNORMAL LOW (ref 100–199)
HDL: 39 mg/dL — ABNORMAL LOW (ref >39)
LDL Chol Calc (NIH): 34 mg/dL (ref 0–99)
Triglycerides: 103 mg/dL (ref 0–149)
VLDL Cholesterol Cal: 19 mg/dL (ref 5–40)

## 2019-10-23 LAB — TSH: TSH: 1.55 u[IU]/mL (ref 0.450–4.500)

## 2019-10-23 LAB — HIV ANTIBODY (ROUTINE TESTING W REFLEX): HIV Screen 4th Generation wRfx: NONREACTIVE

## 2019-10-23 MED ORDER — PANTOPRAZOLE SODIUM 40 MG PO TBEC: 40.0000 mg | DELAYED_RELEASE_TABLET | Freq: Every day | ORAL | 3 refills | Status: DC

## 2019-10-25 DIAGNOSIS — R634 Abnormal weight loss: Secondary | ICD-10-CM | POA: Insufficient documentation

## 2019-10-25 HISTORY — DX: Abnormal weight loss: R63.4

## 2019-10-25 NOTE — Assessment & Plan Note (Signed)
A1c 5.9 today. Well controlled with metformin daily. Continue as is.

## 2019-10-25 NOTE — Assessment & Plan Note (Addendum)
6% weight loss since 06/2019 despite reported unchanged eating habits. While it could certainly be unrecognized dietary changes in the setting of uncontrolled anxiety/high stress, I am quite concerned that tuberculosis may be the cause of this with his additional vague symptomatology. Quant-gold was positive x2 in 2016, but never treated (loss to follow up). Recent CXR/CT without cavitary lesions. Discussed this with patient and given HD contact number/location. Will also check TSH and HIV today to ensure not contributing. Recent CBC/BMP 08/2019 unrevealing.

## 2019-10-28 NOTE — Progress Notes (Signed)
Patient with positive Quantiferon gold in 2017 that was not followed up at [facility] appropriately. Per notes, appears that HD had a difficult time getting a hold of him. Will send to [facility] to contact HD to see if any other work up or treatment has been done.  Additionally, patient would be a good candidate for fundoplication outpatient surgery at [facility]. Will discuss this with him at next clinic visit.

## 2019-11-04 ENCOUNTER — Telehealth: Payer: Self-pay

## 2019-11-04 NOTE — Telephone Encounter (Signed)
Spoke with  pt through interpreter [place] [phone]. Informed of result note and told to come to office to sign ROI. Aquilla Solian, CMA

## 2019-11-05 NOTE — Telephone Encounter (Signed)
Pt also wanted ROI forms for his 5 children. Cause he needs to get records to give to his lawyer. Aquilla Solian, CMA

## 2019-11-09 ENCOUNTER — Ambulatory Visit: Payer: Medicaid Other

## 2019-12-11 ENCOUNTER — Encounter: Payer: Self-pay | Admitting: Family Medicine

## 2019-12-11 ENCOUNTER — Other Ambulatory Visit: Payer: Self-pay

## 2019-12-11 ENCOUNTER — Ambulatory Visit (INDEPENDENT_AMBULATORY_CARE_PROVIDER_SITE_OTHER): Payer: Medicaid Other | Admitting: Family Medicine

## 2019-12-11 VITALS — BP 124/76 | HR 77 | Ht 65.0 in | Wt 136.6 lb

## 2019-12-11 DIAGNOSIS — E119 Type 2 diabetes mellitus without complications: Secondary | ICD-10-CM | POA: Diagnosis not present

## 2019-12-11 DIAGNOSIS — J301 Allergic rhinitis due to pollen: Secondary | ICD-10-CM

## 2019-12-11 DIAGNOSIS — K219 Gastro-esophageal reflux disease without esophagitis: Secondary | ICD-10-CM

## 2019-12-11 DIAGNOSIS — Z789 Other specified health status: Secondary | ICD-10-CM

## 2019-12-11 DIAGNOSIS — F411 Generalized anxiety disorder: Secondary | ICD-10-CM | POA: Diagnosis not present

## 2019-12-11 MED ORDER — CETIRIZINE HCL 10 MG PO TABS: 10.0000 mg | ORAL_TABLET | Freq: Every day | ORAL | 11 refills | Status: DC

## 2019-12-11 MED ORDER — ROSUVASTATIN CALCIUM 20 MG PO TABS: 20.0000 mg | ORAL_TABLET | Freq: Every day | ORAL | 3 refills | Status: DC

## 2019-12-11 MED ORDER — METFORMIN HCL ER 500 MG PO TB24: 1000.0000 mg | ORAL_TABLET | Freq: Every day | ORAL | 3 refills | Status: DC

## 2019-12-11 MED ORDER — PANTOPRAZOLE SODIUM 40 MG PO TBEC: 40.0000 mg | DELAYED_RELEASE_TABLET | Freq: Two times a day (BID) | ORAL | 3 refills | Status: DC

## 2019-12-11 MED ORDER — ASPIRIN 81 MG PO CHEW: 81.0000 mg | CHEWABLE_TABLET | Freq: Every day | ORAL | 3 refills | Status: DC

## 2019-12-11 NOTE — Progress Notes (Signed)
Verbal PHQ-2 given, as patient is unable to read english. Upon completing PHQ-2, question 9 was asked on PHQ-9, patient stated that more than half of the days he feels he would be better off dead. Patient denies current SI. Patient reports financial strain and being overwhelmed with finances.   Provider made aware.   Veronda Prude, RN

## 2019-12-11 NOTE — Patient Instructions (Addendum)
I have sent you a year worth of refills of all of your medications.  I would like you to follow-up in early August for your diabetes.  Your last diabetes numbers look fantastic.  Keep up the great work!  We are also sending you a referral to a resource called chronic care management.  They will help you find community resources for your mental health issues and possibly help you find resources for some of your financial issues.              .           .      .    !           .                  Natividad Brood 'ursilat lak eubuaat 'iieadat taebiat jamie al'adwiat alkhasat bik limudat eami. 'awadu mink 'an tutabie fi 'awayil 'aghustus limarad alsukari alkhasi bika. tabdu 'arqam marad alsukari al'akhirat rayieatan. aistamaruu fi aleamal aleazimi! nursil lak aydana 'iihalatan 'iilaa mawrid yusamaa 'iidarat alrieayat almuzminati. sayusaeidunak fi aleuthur ealaa mawarid mujtamaeiat limushkilat sihatik aleaqliat warubama yusaeiduk fi aleuthur ealaa mawarid libaed mushkilatik almaliati.

## 2019-12-11 NOTE — Progress Notes (Signed)
    SUBJECTIVE:   CHIEF COMPLAINT / HPI:   Stress  Patient having many stressors at home. He reports that his wife's depression continues to increase his work burden around the house and with his children. He reports that they are going to turn off his electricity if he doesn't pay but he doesn't have the money to pay. He feels completely overwhelmed. He has also received a letter from Behavioral services to set up an appointment; however, given language barrier, patient was unable to read letter and make appointment for mental health help.   GERD  Currently well controlled with pantoprazole. Currently taking 40 mg BID as once daily does not work for him. He is asking for increased dose of medication as he cannot afford his medication   PHQ-2 positive.  Questions 9 of PHQ-9 was asked. (See RN note). Spoke to patient about this. Wishing to have life to end. But, not kill himself or commit suicide. No active SI or plan.   PERTINENT  PMH / PSH: PTSD   OBJECTIVE:   BP 124/76   Pulse 77   Ht 5\' 5"  (1.651 m)   Wt 136 lb 9.6 oz (62 kg)   SpO2 98%   BMI 22.73 kg/m   General: well appearing, mild distress. No changes in weight or appearance since last visit.   ASSESSMENT/PLAN:   GERD (gastroesophageal reflux disease) Sending patient PTX BID. Looks like patient has been on a few other OTC acid medications in the past. I think he would be a good candidate for esophageal procedure at [facility].   Will discuss this at next visit with the patient. Will also discuss possible referral to amb GI given long standing GERD for possible EGD.  Language barrier Interpretor used for entirety of visit today.   Diabetes mellitus without complication (HCC) Most recent A1C 6.2. Currently taking Metformin 1,000 mg daily with breakfast. Patient brought pill bottles with him today and appears that he is compliant with medication. Repeat A1C in 3 months.   Generalized anxiety disorder Patient appears to have great  stress that is adding to GAD. I am going to refer him to CCM today for help navigating health care in the setting of language barrier. He received a letter from [facility] but was unable to understand letter. He also reports issues with getting Medicaid for his kids and reports that the MD needs to sign off. Student Doctor Mabe was also in the room with me and took time to explain that CCM will  Call him to try to help with these issues for this patient. We greatly appreciate their help in this patient's case.   Allergic rhinitis Patient reporting issues with congestion and rhinorrhea with seasonal changes. Will try zyrtec and follow up if no relief.    Refilled medications as requested.   [facility], MD [facility]

## 2019-12-15 DIAGNOSIS — J309 Allergic rhinitis, unspecified: Secondary | ICD-10-CM | POA: Insufficient documentation

## 2019-12-15 NOTE — Assessment & Plan Note (Signed)
Patient appears to have great stress that is adding to GAD. I am going to refer him to CCM today for help navigating health care in the setting of language barrier. He received a letter from [facility] but was unable to understand letter. He also reports issues with getting Medicaid for his kids and reports that the MD needs to sign off. Student Doctor Mabe was also in the room with me and took time to explain that CCM will  Call him to try to help with these issues for this patient. We greatly appreciate their help in this patient's case.

## 2019-12-15 NOTE — Assessment & Plan Note (Signed)
Most recent A1C 6.2. Currently taking Metformin 1,000 mg daily with breakfast. Patient brought pill bottles with him today and appears that he is compliant with medication. Repeat A1C in 3 months.

## 2019-12-15 NOTE — Assessment & Plan Note (Signed)
Patient reporting issues with congestion and rhinorrhea with seasonal changes. Will try zyrtec and follow up if no relief.

## 2019-12-15 NOTE — Assessment & Plan Note (Signed)
Interpretor used for entirety of visit today.

## 2019-12-15 NOTE — Assessment & Plan Note (Addendum)
Sending patient PTX BID. Looks like patient has been on a few other OTC acid medications in the past. I think he would be a good candidate for esophageal procedure at [facility].   Will discuss this at next visit with the patient. Will also discuss possible referral to amb GI given long standing GERD for possible EGD.

## 2019-12-18 ENCOUNTER — Ambulatory Visit: Payer: Self-pay | Admitting: Licensed Clinical Social Worker

## 2019-12-18 NOTE — Chronic Care Management (AMB) (Signed)
   Social Work  Care Management Collaboration 12/18/2019 Name: [name] MRN: [id number] DOB: 1975/10/31 Aaron Reyes is a 44 y.o. year old male who sees Selena Batten, Vinnie Langton, MD for primary care. LCSW was consulted by PCP to assistance patient with  Care Coordination needs. Intervention: Patient was not interviewed or contacted during this encounter.  LCSW collaborated with CCM RN and Lyndle Herrlich to make a referral to Congregational nursing. Patient has very complex needs and has difficulty understanding due to language barriers.    LCSW informed my Congregational nursing that the nurse at the [facility] is currently working with this family. LSCW reached out to [employer] to obtain name and phone number for family's care manager.   Recommendation: After collaboration with CCM RN care manager it is determined that patient may benefit from having one contact person to coordinate services dure to language barriers which is [employer].  LCSW will gladly collaborate with them as needed.  Plan:  LCSW waiting to hear back from  [facility] to collaborate on patient's needs to avoid duplicate and confusion with coordinating care.  If not return call or message LCSW will F/U again  Review of patient status, including review of consultants reports, relevant laboratory and other test results, and collaboration with appropriate care team members and the patient's provider was performed as part of comprehensive patient evaluation and provision of chronic care management services.    Sammuel Hines, LCSW Care Management & Coordination  [facility] / [facility]   [phone] 11:47 AM

## 2020-01-05 ENCOUNTER — Ambulatory Visit: Payer: Self-pay | Admitting: Licensed Clinical Social Worker

## 2020-01-05 DIAGNOSIS — Z7189 Other specified counseling: Secondary | ICD-10-CM

## 2020-01-05 NOTE — Chronic Care Management (AMB) (Signed)
   Social Work  Care Management Collaboration 01/05/2020 Name: Aaron Reyes MRN: [id number] DOB: July 20, 1975 Otto Felkins Ericsson is a 44 y.o. year old male who sees Selena Batten, Vinnie Langton, MD for primary care. LCSW was consulted by PCP to assistance patient with unmet mental health needs.  Recommendation: After making a referral to collaboration with [facility] Congregational nursing program  it is determined that patient may benefit from having one contact person to coordinate all needs.   Intervention: Patient was not interviewed or contacted during this encounter.  LCSW collaborated with Arman Bogus from [employer].  Nicole Cella has contacted patient and will serve as Futures trader for coordinating needs.  She will collaborate with LCSW as needed.   Goal: to connect patient with community resources and mental health provider Plan:  1. No further follow up required by LCSW at this time  1. RN Nicole Cella Muhoro from [employer] nursing program will follow up with the patient for ongoing needs. Will assist patient with reestablishing with [facility]     2. If further intervention or needs are Identified  RN care manager will contact LCSW to provide interventions.    Review of patient status, including review of consultants reports, relevant laboratory and other test results, and collaboration with appropriate care team members and the patient's provider was performed as part of comprehensive patient evaluation and provision of chronic care management services.    Sammuel Hines, LCSW Care Management & Coordination  [facility] / [facility]   [phone] 10:13 AM

## 2020-01-12 ENCOUNTER — Ambulatory Visit: Payer: Self-pay | Admitting: Licensed Clinical Social Worker

## 2020-01-12 DIAGNOSIS — Z7189 Other specified counseling: Secondary | ICD-10-CM

## 2020-01-12 DIAGNOSIS — F419 Anxiety disorder, unspecified: Secondary | ICD-10-CM

## 2020-01-12 DIAGNOSIS — F4310 Post-traumatic stress disorder, unspecified: Secondary | ICD-10-CM

## 2020-01-12 DIAGNOSIS — F411 Generalized anxiety disorder: Secondary | ICD-10-CM

## 2020-01-12 NOTE — Chronic Care Management (AMB) (Signed)
   Social Work  Care Management Collaboration 01/12/2020 Name: Elih Mooney MRN: [id number] DOB: [birth year] Fred Franzen Kurek is a 44 y.o. year old male who sees Selena Batten, Vinnie Langton, MD for primary care.   LCSW received referral from PCP to assistance patient with Care Coordination for mental health needs due to language barriers. Patient has been referred to Congregational Nurse program by LCSW.  Intervention:  Patient was not interviewed or contacted during this encounter.  LCSW collaborated with Nicole Cella from [employer] Nurse program who has reach out to patient and his wife.   LCSW made several calls to coordinate care for patient and placed behavioral health referral to assist patient with connecting to [facility] for unmet behavioral health needs.  Spoke to [facility] and was informed the will reach out to patient to schedule the appointment.  Plan:   1. RN Nicole Cella from [employer] Nurse program care manager will follow up with the patient for ongoing needs.    2. LCSW will continue to collaborate with Nicole Cella as needed.    Review of patient status, including review of consultants reports, relevant laboratory and other test results, and collaboration with appropriate care team members and the patient's provider was performed as part of comprehensive patient evaluation and provision of chronic care management services.    Sammuel Hines, LCSW Care Management & Coordination  [facility] / [facility]   [phone] 2:40 PM

## 2020-01-19 ENCOUNTER — Telehealth: Payer: Self-pay

## 2020-01-19 NOTE — Telephone Encounter (Signed)
I have contacted Aaron Reyes using [employer] Arabic interpreter to establish care with congregational nurse. Aaron Reyes is waiting on a referral to see a mental health specialist. I have informed him that I will let him know as soon as I have an appointment date and time.  He has my cell phone number to call me if need arises. Aaron Graziani RN BSn PCCn [phone]-mobile [phone]-cell

## 2020-01-20 ENCOUNTER — Telehealth: Payer: Self-pay

## 2020-01-20 ENCOUNTER — Ambulatory Visit: Payer: Self-pay | Admitting: Licensed Clinical Social Worker

## 2020-01-20 NOTE — Chronic Care Management (AMB) (Signed)
   Social Work  Care Management Collaboration 01/20/2020 Name: Aaron Reyes MRN: [id number] DOB: [birth year] Aaron Reyes is a 44 y.o. year old male who sees Selena Batten, Vinnie Langton, MD for primary care. LCSW was consulted by PCP to assistance patient with  Care Coordination for unmet mental health needs.   Referral placed to [facility].  Rhea Belton, with [facility] has tried to contact patient but was unsuccessful. Left voice message with Arabic interpreter.   Intervention: Patient was not interviewed or contacted during this encounter.  LCSW collaborated with [employer] health and Dorothy with Congregational Nurse Program to provide updates. Plan: LCSW will continue to follow until appointment is scheduled  Review of patient status, including review of consultants reports, relevant laboratory and other test results, and collaboration with appropriate care team members and the patient's provider was performed as part of comprehensive patient evaluation and provision of chronic care management services.    Sammuel Hines, LCSW Care Management & Coordination  [facility] / [facility]   [phone] 9:22 AM

## 2020-01-20 NOTE — Telephone Encounter (Signed)
[facility] [employer] contacted and connected to patient using Arabic interpreter via 3 way call.I was able to listen to entire conversation. Client is scheduled to see therapist September 16th at 11am and is also scheduled for medication on  September 29th at 1pm. I have texted him the address as well. Wynelle Dreier RN BSn PCCn [phone]-office [phone]-cell

## 2020-01-20 NOTE — Congregational Nurse Program (Signed)
  Dept: [phone]   Congregational Nurse Program Note  Date of Encounter: 01/20/2020  Past Medical History: Past Medical History:  Diagnosis Date  . Bilateral sensorineural hearing loss 02/07/2017  . Cervicalgia 10/19/2016  . Closed fracture of body of sternum 06/09/2018  . Food insecurity 01/02/2019  . GERD (gastroesophageal reflux disease)   . Kidney stone   . Myocardial infarct (HCC)   . Non-cardiac chest pain   . 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 S/P cardiac catheterization 03/2015   cath in [place] and [place], no records,  cath at [facility] normal coronary arteries and normal LV function.  . Testicular mass 03/18/2015    Encounter Details:  CNP Questionnaire - 01/20/20 1135      Questionnaire   Patient Status Refugee    Race Asian    Location Patient Served At [facility]    Insurance Medicaid    Uninsured Not Applicable    Food No food insecurities    Housing/Utilities Yes, have permanent housing    Transportation Yes, need transportation assistance    Interpersonal Safety Yes, feel physically and emotionally safe where you currently live    Medication No medication insecurities    Medical Provider Yes    Referrals Area Agency;[facility]    ED Visit Averted Not Applicable    Life-Saving Intervention Made Not Applicable          I have texted client with date,time and address of [facility] appointment per his request. Arman Bogus RN BSn PCCN [phone]-cell [phone]-office

## 2020-02-01 ENCOUNTER — Other Ambulatory Visit: Payer: Self-pay

## 2020-02-01 ENCOUNTER — Ambulatory Visit (INDEPENDENT_AMBULATORY_CARE_PROVIDER_SITE_OTHER): Payer: Medicaid Other | Admitting: Clinical

## 2020-02-01 DIAGNOSIS — F4310 Post-traumatic stress disorder, unspecified: Secondary | ICD-10-CM

## 2020-02-01 NOTE — Progress Notes (Signed)
   THERAPIST PROGRESS NOTE  Session Time: 30 minutes  Participation Level: Active  Behavioral Response: CasualAlertDepressed and Hopeless  Type of Therapy: Individual Therapy  Treatment Goals addressed: Coping  Interventions: Supportive  Summary:  Aaron Reyes is a 44 y.o. male who presents with symptoms of depression and anxiety. Client presented with his wife for the scheduled session. Client participated in the session by assistance of an interpreter via office tele- phone. Client presented oriented times five, appropriately dressed, and cooperative. Client denied hallucinations and delusions. Client reported he presented on today by mistake of his upcoming scheduled therapy appointment. Client reported he was unable to understand the english reminder. Client reported he, his wife, and five children have been having a hard time since they moved to the [place] from [place] about five years ago. Client reported they have seen traumatic things from war back in their home country. Client reported when their son was one after they moved to this country their son who was one years old at the time was viscously attacked by the dog; his face was deformed following the attack. Client showed the therapist pictures of the sons condition at the time. Client reported currently he and his family have struggled with lack of sleep, nightmares, and fearful to let their children play outside. Client reported he would like to work on his trauma symptoms and feeling safe. Client discussed with the therapist by assistance of the interpreter confirming his appropriate upcoming doctor and therapy appointments.       Suicidal/Homicidal: Nowithout intent/plan  Therapist Response:  Therapist rendered the session during walk in clinic hours.  Therapist provided a interpreter via tel -phone.  Therapist was informed the client misunderstood the date of his upcoming therapy appointment.  Therapist  provided the session to assess the clients current mental health symptoms. Therapist discussed the therapy process and who he will be seeing for ongoing sessions. Therapist discussed the clients understanding of his upcoming appointments.      Plan: Return again in 2 weeks for individual therapy appointment with Richardson Dopp LCSW.   Diagnosis: Posttraumatic stress disorder    Neena Rhymes Brooke Payes, LCSW 02/01/2020

## 2020-02-10 ENCOUNTER — Other Ambulatory Visit: Payer: Self-pay

## 2020-02-10 ENCOUNTER — Ambulatory Visit (INDEPENDENT_AMBULATORY_CARE_PROVIDER_SITE_OTHER): Payer: Medicaid Other | Admitting: Student in an Organized Health Care Education/Training Program

## 2020-02-10 ENCOUNTER — Ambulatory Visit (HOSPITAL_COMMUNITY)
Admission: RE | Admit: 2020-02-10 | Discharge: 2020-02-10 | Disposition: A | Discharge status: Home / Self Care | Payer: Medicaid Other | Source: Ambulatory Visit | Attending: Family Medicine | Admitting: Family Medicine

## 2020-02-10 VITALS — BP 120/80 | HR 85 | Wt 140.8 lb

## 2020-02-10 DIAGNOSIS — M5412 Radiculopathy, cervical region: Secondary | ICD-10-CM | POA: Diagnosis not present

## 2020-02-10 DIAGNOSIS — S4991XA Unspecified injury of right shoulder and upper arm, initial encounter: Secondary | ICD-10-CM

## 2020-02-10 DIAGNOSIS — M25511 Pain in right shoulder: Secondary | ICD-10-CM | POA: Diagnosis not present

## 2020-02-10 IMAGING — DX DG SHOULDER 2+V*R*
3 series · 3 of 3 positions shown · non-contrast
Comparison: None.

CLINICAL DATA: Right shoulder pain

EXAM:
RIGHT SHOULDER - 2+ VIEW

[w shoulder external right]
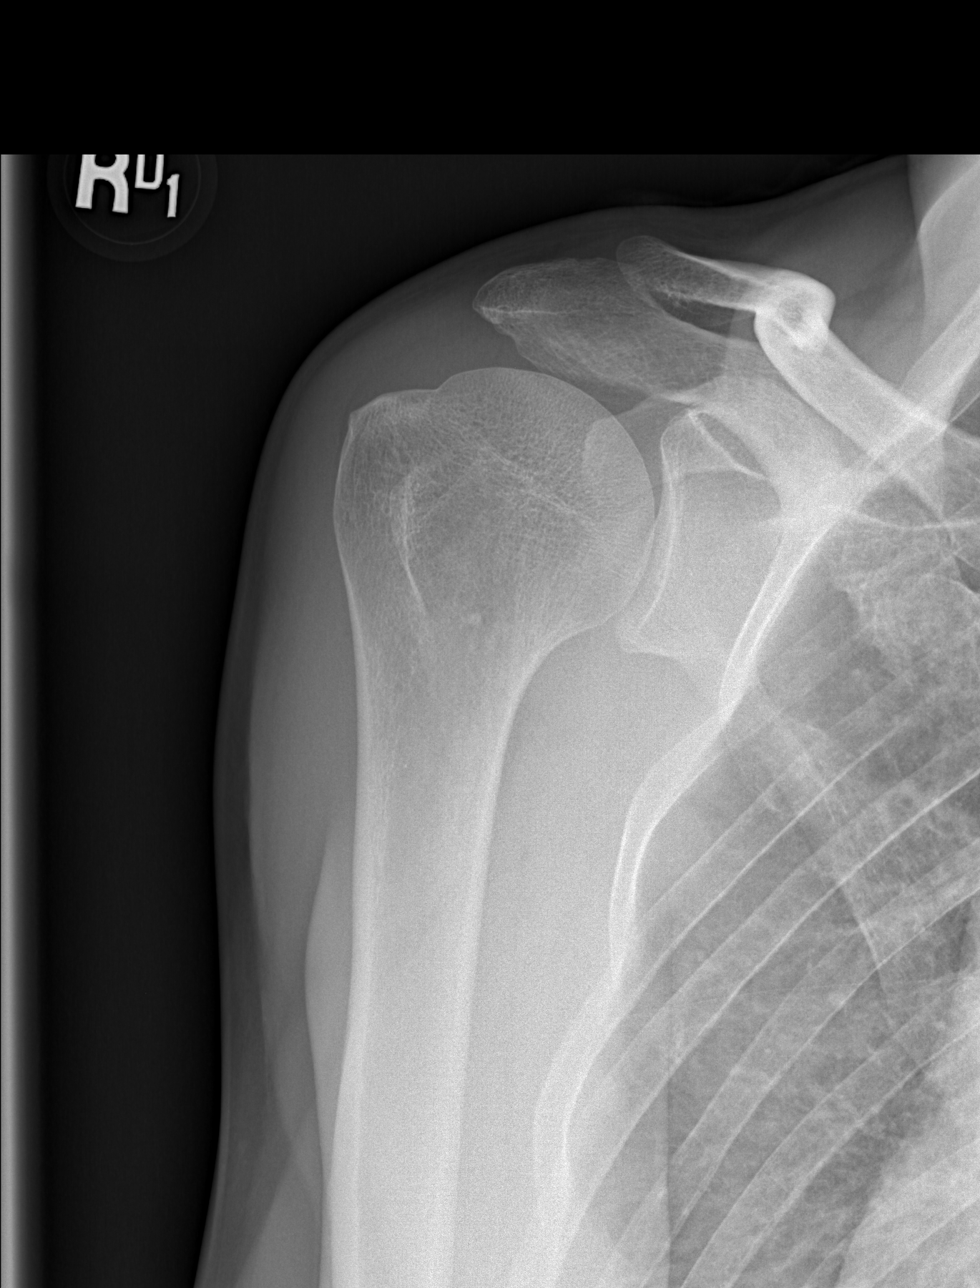

[w shoulder y-view right]
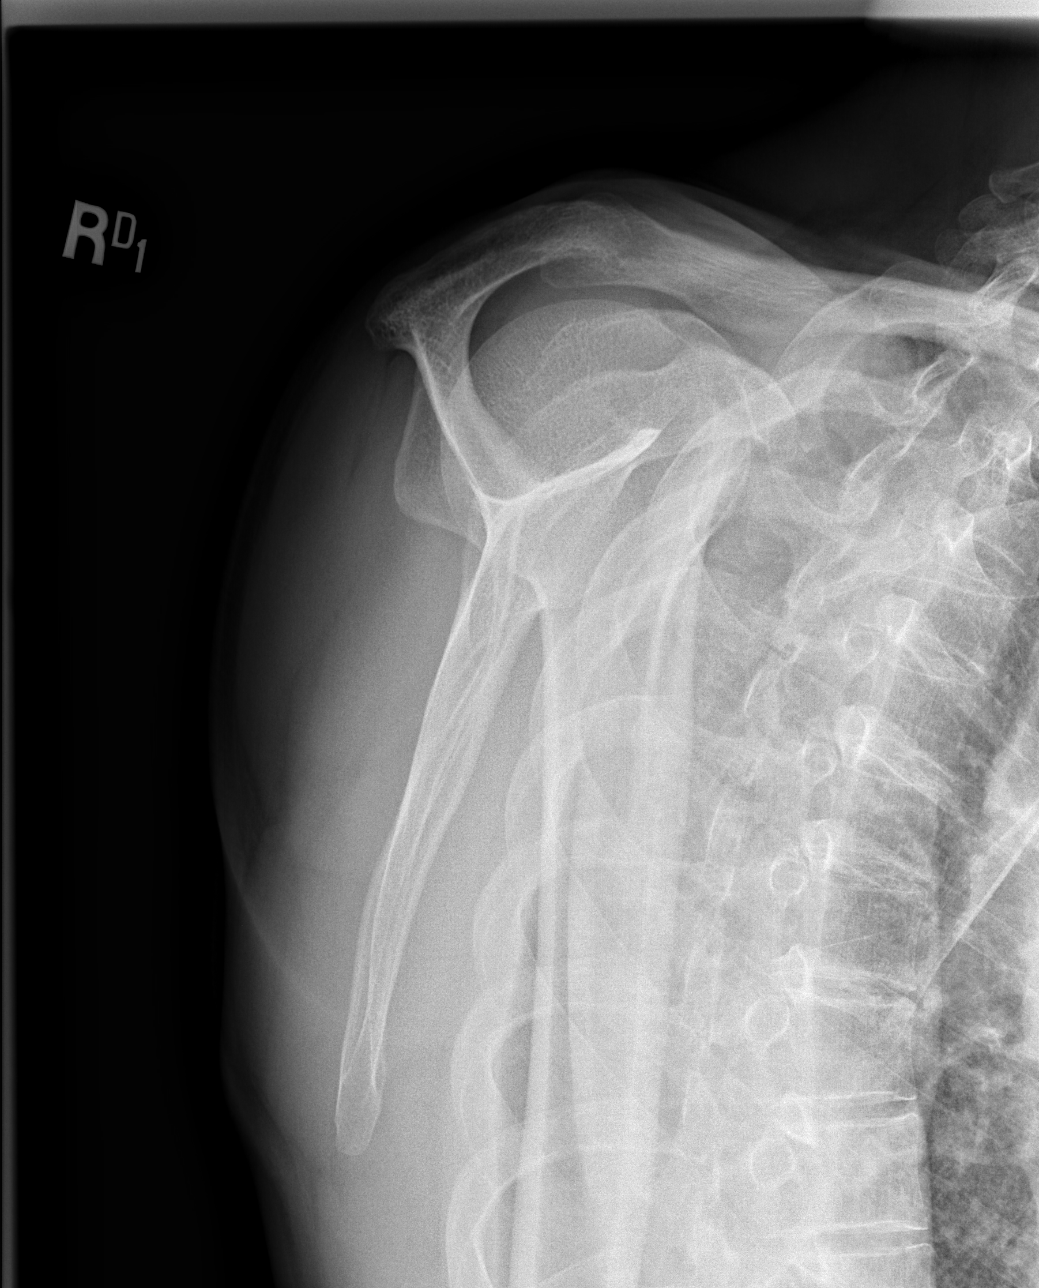

[x shoulder axillary right]
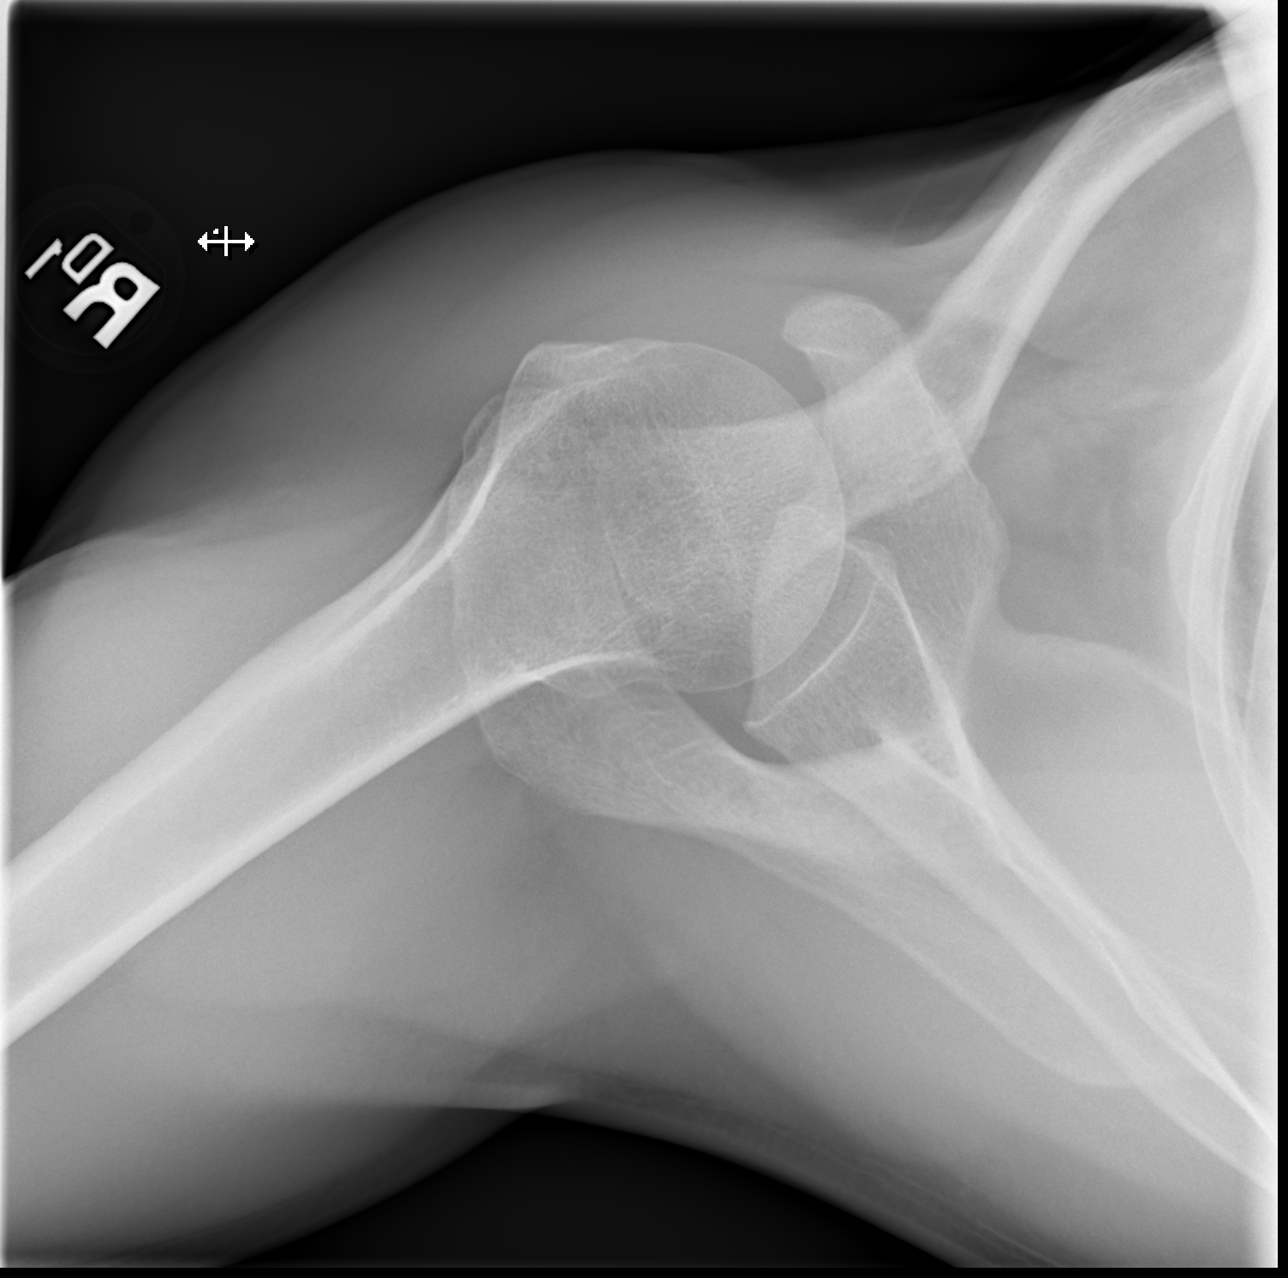

[3 of 3 positions shown; findings below may reference images not displayed]

FINDINGS: There is no evidence of fracture or dislocation. There is no
evidence of arthropathy or other focal bone abnormality. Soft
tissues are unremarkable.
IMPRESSION: Negative.

## 2020-02-10 IMAGING — DX DG CERVICAL SPINE COMPLETE 4+V
5 series · 5 of 5 positions shown · non-contrast
Comparison: CT [DATE]

CLINICAL DATA: Radiculopathy right shoulder pain

EXAM:
CERVICAL SPINE - COMPLETE 4+ VIEW

[w cervical spine lat]
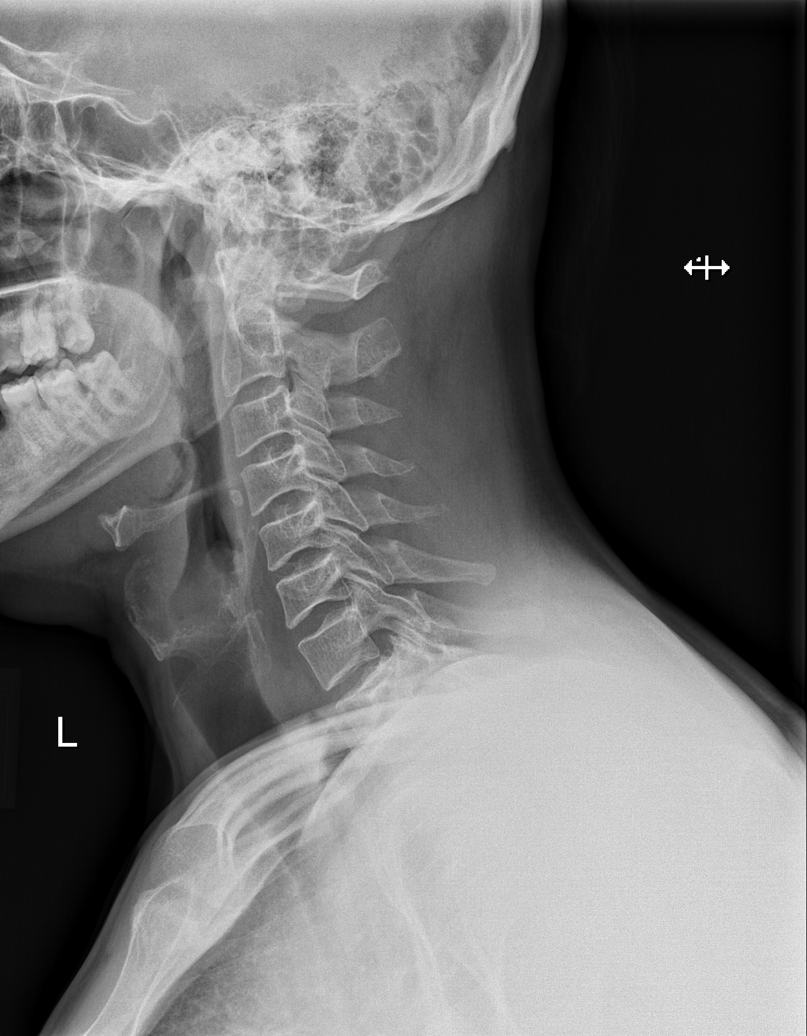

[w cervical spine ap_obl (1 of 2)]
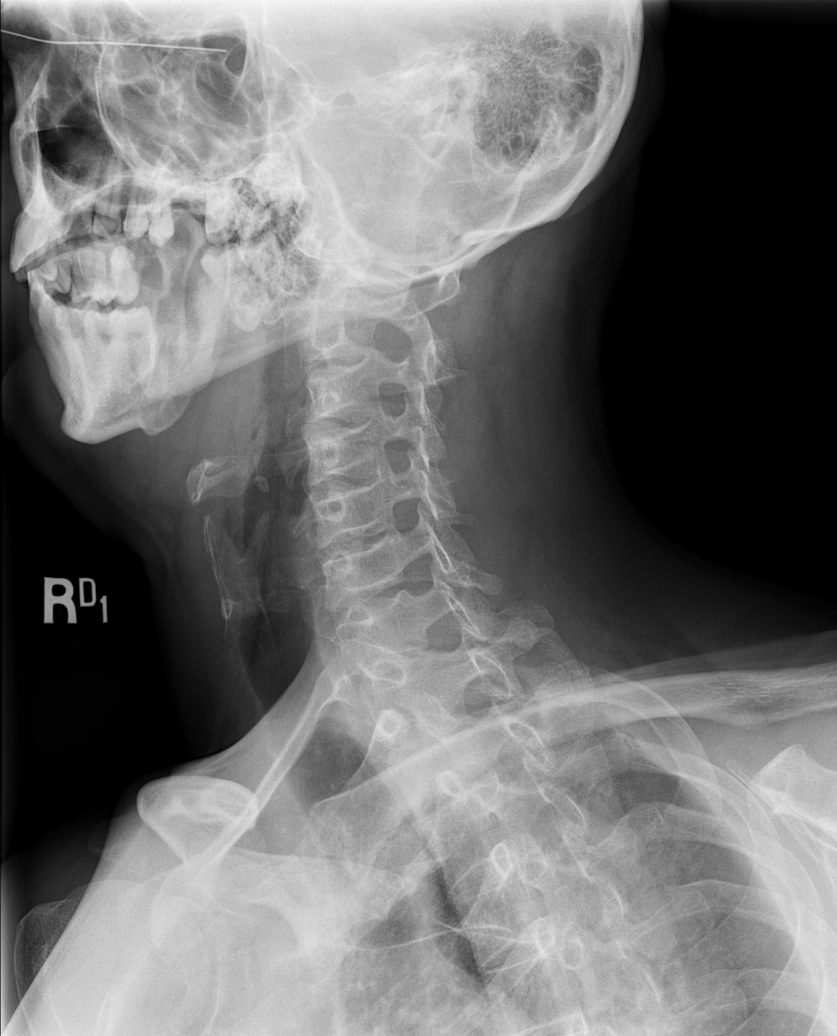

[w cervical spine ap_obl (2 of 2)]
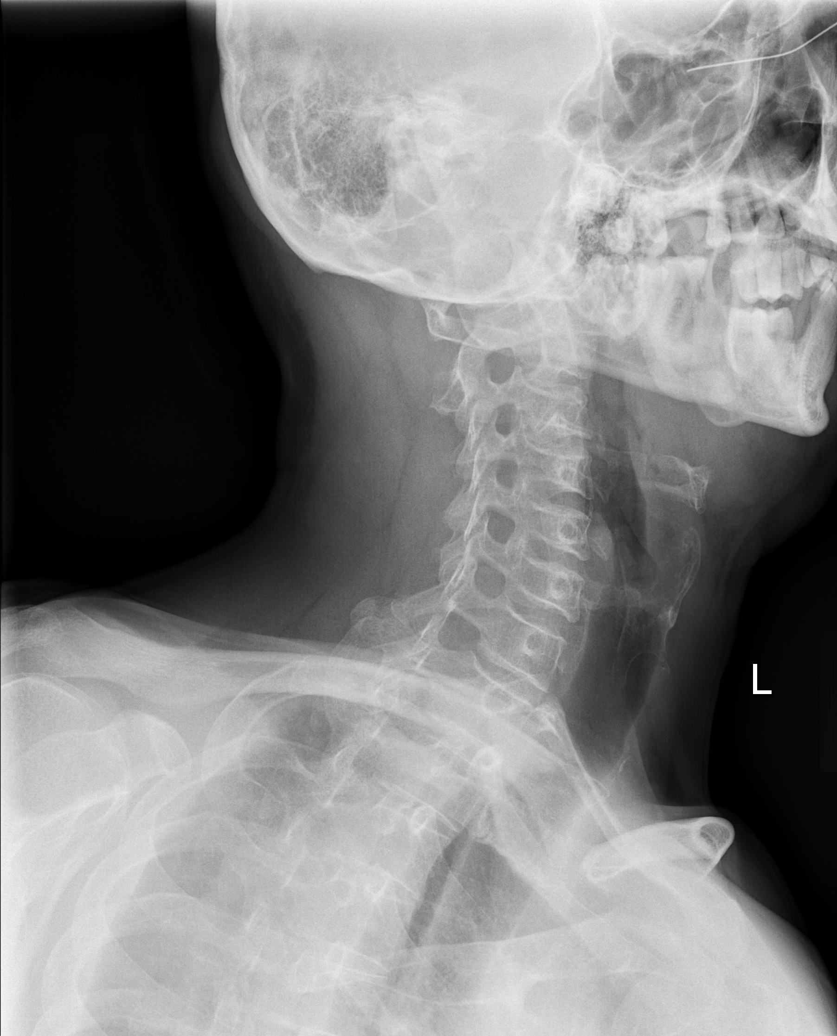

[w cervical spine ap]
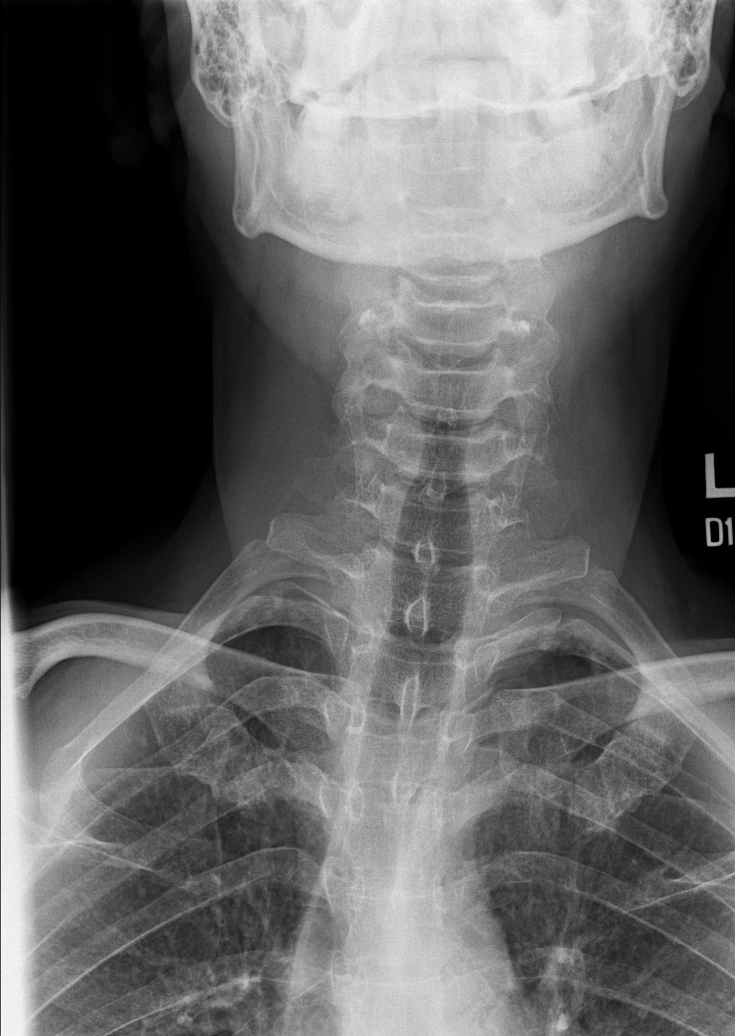

[w cervical spine odontoid]
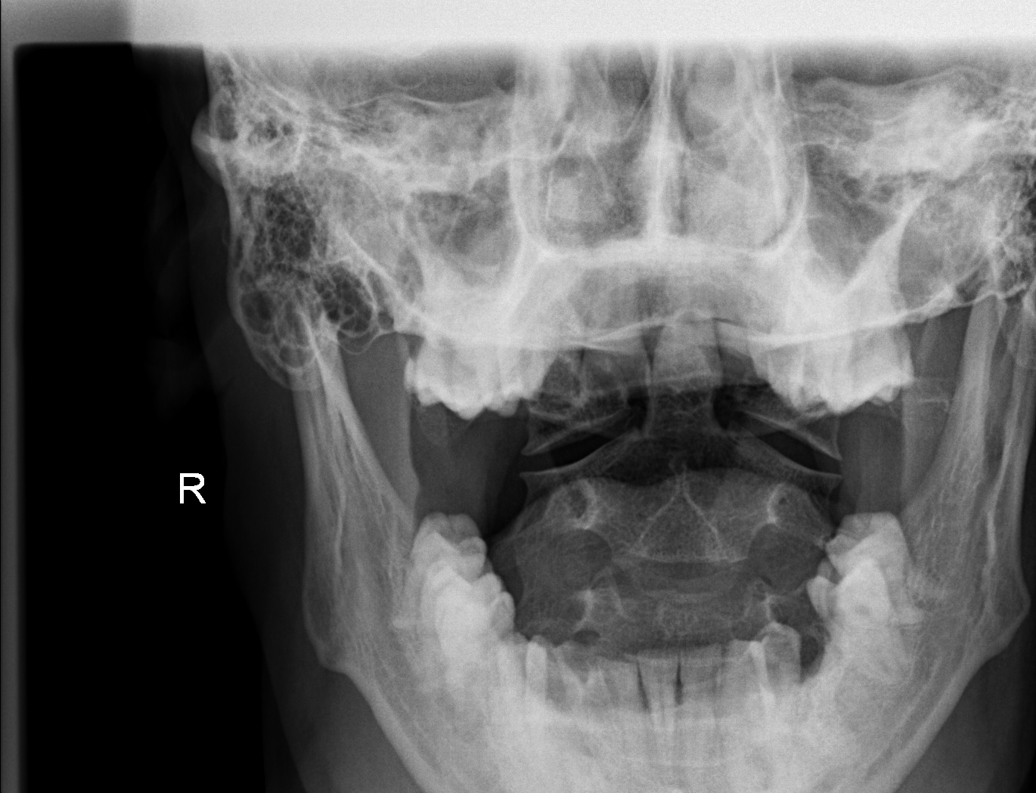

[5 of 5 positions shown; findings below may reference images not displayed]

FINDINGS: There is no evidence of cervical spine fracture or prevertebral soft
tissue swelling. Alignment is normal. No other significant bone
abnormalities are identified.
IMPRESSION: Negative cervical spine radiographs.

## 2020-02-10 MED ORDER — MELOXICAM 7.5 MG PO TABS
7.5000 mg | ORAL_TABLET | Freq: Every day | ORAL | 0 refills | Status: DC
Start: 2020-02-10 — End: 2020-08-01

## 2020-02-10 NOTE — Patient Instructions (Addendum)
It was a pleasure to see you today!  To summarize our discussion for this visit:  We are going to get an xray of your neck and shoulder to rule out fracture.   Muscle relaxer for sleep  Giving you exercises to do daily  Use warm heat like heating pad or showers  Please return to our clinic to see [place] in about 4 weeks if not improving.  Call the clinic at [phone] if your symptoms worsen or you have any concerns.   Thank you for allowing me to take part in your care,  Dr. Jamelle Rushing   Cervical Strain and Sprain Rehab Ask your health care provider which exercises are safe for you. Do exercises exactly as told by your health care provider and adjust them as directed. It is normal to feel mild stretching, pulling, tightness, or discomfort as you do these exercises. Stop right away if you feel sudden pain or your pain gets worse. Do not begin these exercises until told by your health care provider. Stretching and range-of-motion exercises Cervical side bending  1. Using good posture, sit on a stable chair or stand up. 2. Without moving your shoulders, slowly tilt your left / right ear to your shoulder until you feel a stretch in the opposite side neck muscles. You should be looking straight ahead. 3. Hold for __________ seconds. 4. Repeat with the other side of your neck. Repeat __________ times. Complete this exercise __________ times a day. Cervical rotation  1. Using good posture, sit on a stable chair or stand up. 2. Slowly turn your head to the side as if you are looking over your left / right shoulder. ? Keep your eyes level with the ground. ? Stop when you feel a stretch along the side and the back of your neck. 3. Hold for __________ seconds. 4. Repeat this by turning to your other side. Repeat __________ times. Complete this exercise __________ times a day. Thoracic extension and pectoral stretch 1. Roll a towel or a small blanket so it is about 4 inches (10 cm)  in diameter. 2. Lie down on your back on a firm surface. 3. Put the towel lengthwise, under your spine in the middle of your back. It should not be under your shoulder blades. The towel should line up with your spine from your middle back to your lower back. 4. Put your hands behind your head and let your elbows fall out to your sides. 5. Hold for __________ seconds. Repeat __________ times. Complete this exercise __________ times a day. Strengthening exercises Isometric upper cervical flexion 1. Lie on your back with a thin pillow behind your head and a small rolled-up towel under your neck. 2. Gently tuck your chin toward your chest and nod your head down to look toward your feet. Do not lift your head off the pillow. 3. Hold for __________ seconds. 4. Release the tension slowly. Relax your neck muscles completely before you repeat this exercise. Repeat __________ times. Complete this exercise __________ times a day. Isometric cervical extension  1. Stand about 6 inches (15 cm) away from a wall, with your back facing the wall. 2. Place a soft object, about 6-8 inches (15-20 cm) in diameter, between the back of your head and the wall. A soft object could be a small pillow, a ball, or a folded towel. 3. Gently tilt your head back and press into the soft object. Keep your jaw and forehead relaxed. 4. Hold for __________ seconds. 5.  Release the tension slowly. Relax your neck muscles completely before you repeat this exercise. Repeat __________ times. Complete this exercise __________ times a day. Posture and body mechanics Body mechanics refers to the movements and positions of your body while you do your daily activities. Posture is part of body mechanics. Good posture and healthy body mechanics can help to relieve stress in your body's tissues and joints. Good posture means that your spine is in its natural S-curve position (your spine is neutral), your shoulders are pulled back slightly, and  your head is not tipped forward. The following are general guidelines for applying improved posture and body mechanics to your everyday activities. Sitting  1. When sitting, keep your spine neutral and keep your feet flat on the floor. Use a footrest, if necessary, and keep your thighs parallel to the floor. Avoid rounding your shoulders, and avoid tilting your head forward. 2. When working at a desk or a computer, keep your desk at a height where your hands are slightly lower than your elbows. Slide your chair under your desk so you are close enough to maintain good posture. 3. When working at a computer, place your monitor at a height where you are looking straight ahead and you do not have to tilt your head forward or downward to look at the screen. Standing   When standing, keep your spine neutral and keep your feet about hip-width apart. Keep a slight bend in your knees. Your ears, shoulders, and hips should line up.  When you do a task in which you stand in one place for a long time, place one foot up on a stable object that is 2-4 inches (5-10 cm) high, such as a footstool. This helps keep your spine neutral. Resting When lying down and resting, avoid positions that are most painful for you. Try to support your neck in a neutral position. You can use a contour pillow or a small rolled-up towel. Your pillow should support your neck but not push on it. This information is not intended to replace advice given to you by your health care provider. Make sure you discuss any questions you have with your health care provider. Document Revised: 09/24/2018 Document Reviewed: 03/05/2018 Elsevier Patient Education  2020 [employer].

## 2020-02-10 NOTE — Progress Notes (Signed)
   SUBJECTIVE:   CHIEF COMPLAINT / HPI: neck pain  Having neck pain and whole back and both legs. This has been present for 5 days. Never had anything like this happen before.  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 It felt like an electric shock that started in the right back neck that radiated to the shoulder, down the arm, and down the back.  He went home and took a shower. When he woke up, he had difficulty turning his neck to the side.  The pain remains in neck and shoulder. Having pain at night which prevents him from sleeping. He has not been able to work. Has been trying a topical patch on the neck and back which does modestly help but for a short period of time.  OBJECTIVE:   BP 120/80   Pulse 85   Wt 140 lb 12.8 oz (63.9 kg)   SpO2 98%   BMI 23.43 kg/m   Physical Exam: Gen: NAD, comfortable in exam room Neck: Observation:no erythema, edema Palpation: Negative for tenderness to cervical vertebrae ROM: Decreased range of motion with rotation, sidebending, flexion, extension due to pain Strength: Unable to assess due to pain Special tests: Negative Spurling bilaterally  Physical Exam: Gen: NAD, comfortable in exam room R Shoulder: Observation: No erythema, edema, deformity Palpation:Tender to trapezius, deltoid and rhomboid musculature of shoulder on right.  Musculature is tight ROM: Full active range of motion symmetrical to left Strength: Decreased strength with abduction but unable to fully assess due to pain and language barrier Special tests: Negative empty can  ASSESSMENT/PLAN:   Right shoulder pain Due to injury at work. likely muscular in nature but will obtain x-rays to rule out fracture which were negative. Providing patient with anti-inflammatory and muscle relaxer to help treat symptoms and assist with sleeping - recommended heating pad and rest - provided with cervical strain rehab exercises -  return in 3-4 weeks if not improving with these measures     Aaron Bock, DO [facility]

## 2020-02-11 ENCOUNTER — Ambulatory Visit (HOSPITAL_COMMUNITY)
Admission: EM | Admit: 2020-02-11 | Discharge: 2020-02-11 | Disposition: A | Discharge status: Home / Self Care | Payer: Medicaid Other | Attending: Emergency Medicine | Admitting: Emergency Medicine

## 2020-02-11 ENCOUNTER — Encounter (HOSPITAL_COMMUNITY): Payer: Self-pay

## 2020-02-11 DIAGNOSIS — R109 Unspecified abdominal pain: Secondary | ICD-10-CM | POA: Insufficient documentation

## 2020-02-11 DIAGNOSIS — R10A1 Flank pain, right side: Secondary | ICD-10-CM

## 2020-02-11 DIAGNOSIS — M5441 Lumbago with sciatica, right side: Secondary | ICD-10-CM

## 2020-02-11 LAB — BASIC METABOLIC PANEL
Anion gap: 9 (ref 5–15)
BUN: 16 mg/dL (ref 6–20)
CO2: 24 mmol/L (ref 22–32)
Calcium: 9.5 mg/dL (ref 8.9–10.3)
Chloride: 105 mmol/L (ref 98–111)
Creatinine, Ser: 0.78 mg/dL (ref 0.61–1.24)
GFR calc Af Amer: >60 mL/min (ref >60)
GFR calc non Af Amer: >60 mL/min (ref >60)
Glucose, Bld: 117 mg/dL — ABNORMAL HIGH (ref 70–99)
Potassium: 4 mmol/L (ref 3.5–5.1)
Sodium: 138 mmol/L (ref 135–145)

## 2020-02-11 LAB — POCT URINALYSIS DIPSTICK, ED / UC
Bilirubin Urine: NEGATIVE
Glucose, UA: NEGATIVE mg/dL
Hgb urine dipstick: NEGATIVE
Ketones, ur: NEGATIVE mg/dL
Leukocytes,Ua: NEGATIVE
Nitrite: NEGATIVE
Protein, ur: NEGATIVE mg/dL
Specific Gravity, Urine: >=1.03 (ref 1.005–1.030)
Urobilinogen, UA: 0.2 mg/dL (ref 0.0–1.0)
pH: 5 (ref 5.0–8.0)

## 2020-02-11 LAB — CBG MONITORING, ED: Glucose-Capillary: 117 mg/dL — ABNORMAL HIGH (ref 70–99)

## 2020-02-11 MED ORDER — CYCLOBENZAPRINE HCL 5 MG PO TABS: 5.0000 mg | ORAL_TABLET | Freq: Two times a day (BID) | ORAL | 0 refills | Status: DC | PRN

## 2020-02-11 MED ORDER — KETOROLAC TROMETHAMINE 30 MG/ML IJ SOLN
30.0000 mg | Freq: Once | INTRAMUSCULAR | Status: AC
  Administered 2020-02-11: 30 mg via INTRAMUSCULAR

## 2020-02-11 MED ORDER — KETOROLAC TROMETHAMINE 30 MG/ML IJ SOLN
INTRAMUSCULAR | Status: AC
  Filled 2020-02-11: qty 1

## 2020-02-11 MED ORDER — NAPROXEN 500 MG PO TABS: 500.0000 mg | ORAL_TABLET | Freq: Two times a day (BID) | ORAL | 0 refills | Status: DC

## 2020-02-11 NOTE — ED Provider Notes (Signed)
[place]    CSN: [id number] Arrival date & time: 02/11/20  1328      History   Chief Complaint Chief Complaint  Patient presents with   Back Pain   Leg Pain    HPI Aaron Reyes is a 44 y.o. male presenting today for evaluation of back pain. Pain began yesterday. Mid-lower back radiating into right leg. Denies injury/fall. 5 days ago lifted heavy item and felt a pain in neck and into back. Neck stiffness initially, improved mildly. Imagine yesterday of neck and shoulder. Denies numbness or tingling. Denies urinary symptoms. Denies loss of control of bowel/bladder. Denies saddle anesthesia. Has not taken any medicines. Worse with movement. Occasional shortness of breath due to pain. Concerned about blood sugar- history of diabetes.   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   Testicular mass 03/18/2015    Patient Active Problem List   Diagnosis Date Noted   Allergic rhinitis 12/15/2019   Recent unintentional weight loss over several months 10/25/2019   Diabetes mellitus without complication ([place]) 12/82/0813   Language barrier 01/02/2019   Tobacco abuse 01/01/2018   GERD (gastroesophageal reflux disease) 09/04/2016   Generalized anxiety disorder 01/26/2016   Post traumatic stress disorder 01/26/2016    Past Surgical History:  Procedure Laterality Date   CARDIAC CATHETERIZATION     CARDIAC CATHETERIZATION     x 2   CARDIAC CATHETERIZATION N/A 04/11/2015   Procedure: Left Heart Cath  and Coronary Angiography;  Surgeon: Belva Crome, MD;  Location: [facility];  Service: Cardiovascular;  Laterality: N/A;   HERNIA REPAIR         Home Medications    Prior to Admission medications   Medication Sig Start Date End Date Taking? Authorizing Provider  aspirin 81 MG chewable tablet Chew 1 tablet (81 mg total) by mouth daily. 12/11/19   Wilber Oliphant, MD  blood glucose meter kit and supplies KIT Dispense based on patient and insurance preference. Use up to four times daily as directed. (FOR ICD-9 250.00, 250.01). 09/24/19   Faustino Congress, NP  cetirizine (ZYRTEC) 10 MG tablet Take 1 tablet (10 mg total) by mouth daily. 12/11/19   Wilber Oliphant, MD  cyclobenzaprine (FLEXERIL) 5 MG tablet Take 1-2 tablets (5-10 mg total) by mouth 2 (two) times daily as needed for muscle spasms. 02/11/20   Jareth Pardee C, PA-C  meloxicam (MOBIC) 7.5 MG tablet Take 1 tablet (7.5 mg total) by mouth daily. 02/10/20   Doristine Mango L, DO  metFORMIN (GLUCOPHAGE-XR) 500 MG 24 hr tablet Take 2 tablets (1,000 mg total) by mouth daily with breakfast. 12/11/19   Wilber Oliphant, MD  naproxen (NAPROSYN) 500 MG tablet Take 1 tablet (500 mg total) by mouth 2 (two) times daily. 02/11/20   Jayd Forrey C, PA-C  pantoprazole (PROTONIX) 40 MG tablet Take 1 tablet (40 mg total) by mouth 2 (two) times daily. 12/11/19   Wilber Oliphant, MD  rosuvastatin (CRESTOR) 20 MG tablet Take 1  tablet (20 mg total) by mouth daily. 12/11/19   Wilber Oliphant, MD    Family History Family History  Problem Relation Age of Onset   CAD Father    Heart attack Father    Hypertension Father    Stroke Father    Cancer Mother    Diabetes Mother    Hypertension Mother     Social History Social History   Tobacco Use   Smoking status: Current Every Day Smoker    Packs/day: 0.25    Types: Cigarettes   Smokeless tobacco: Never Used  Substance Use Topics   Alcohol use: No   Drug use: No     Allergies     Pork-derived products   Review of Systems Review of Systems  Constitutional: Negative for fatigue and fever.  Eyes: Negative for redness, itching and visual disturbance.  Respiratory: Positive for shortness of breath.   Cardiovascular: Negative for chest pain and leg swelling.  Gastrointestinal: Negative for nausea and vomiting.  Genitourinary: Positive for flank pain. Negative for dysuria, frequency and hematuria.  Musculoskeletal: Positive for back pain and gait problem. Negative for arthralgias and myalgias.  Skin: Negative for color change, rash and wound.  Neurological: Negative for dizziness, syncope, weakness, light-headedness and headaches.     Physical Exam Triage Vital Signs ED Triage Vitals  Enc Vitals Group     BP 02/11/20 1354 112/72     Pulse Rate 02/11/20 1354 86     Resp 02/11/20 1354 18     Temp 02/11/20 1354 98.1 F (36.7 C)     Temp Source 02/11/20 1354 Oral     SpO2 02/11/20 1354 99 %     Weight --      Height --      Head Circumference --      Peak Flow --      Pain Score 02/11/20 1355 10     Pain Loc --      Pain Edu? --      Excl. in [place]? --    No data found.  Updated Vital Signs BP 112/72 (BP Location: Right Arm)    Pulse 86    Temp 98.1 F (36.7 C) (Oral)    Resp 18    SpO2 99%   Visual Acuity Right Eye Distance:   Left Eye Distance:   Bilateral Distance:    Right Eye Near:   Left Eye Near:    Bilateral Near:     Physical Exam Vitals and nursing note reviewed.  Constitutional:      Appearance: He is well-developed.     Comments: No acute distress  HENT:     Head: Normocephalic and atraumatic.     Nose: Nose normal.  Eyes:     Conjunctiva/sclera: Conjunctivae normal.  Cardiovascular:     Rate and Rhythm: Normal rate.  Pulmonary:     Effort: Pulmonary effort is normal. No respiratory distress.     Comments: Breathing comfortably at rest, CTABL, no wheezing, rales or other adventitious sounds auscultated  Abdominal:      General: There is no distension.     Comments: Mild tenderness to epigastrum  Musculoskeletal:        General: Normal range of motion.     Cervical back: Neck supple.     Comments: Tender to palpation along lumbar spine diffusely, increased tenderness throughout left lumbar area Strength in hips and knees 5/5 and equal bilaterally Patellar reflux 2+ bilaterally   Skin:  General: Skin is warm and dry.  Neurological:     Mental Status: He is alert and oriented to person, place, and time.      UC Treatments / Results  Labs (all labs ordered are listed, but only abnormal results are displayed) Labs Reviewed  CBG MONITORING, ED - Abnormal; Notable for the following components:      Result Value   Glucose-Capillary 117 (*)    All other components within normal limits  BASIC METABOLIC PANEL  POCT URINALYSIS DIPSTICK, ED / UC    EKG   Radiology DG Cervical Spine Complete  Result Date: 02/10/2020 CLINICAL DATA:  Radiculopathy right shoulder pain EXAM: CERVICAL SPINE - COMPLETE 4+ VIEW COMPARISON:  CT 11/30/2018 FINDINGS: There is no evidence of cervical spine fracture or prevertebral soft tissue swelling. Alignment is normal. No other significant bone abnormalities are identified. IMPRESSION: Negative cervical spine radiographs. Electronically Signed   By: Donavan Foil M.D.   On: 02/10/2020 19:05   DG Shoulder Right  Result Date: 02/10/2020 CLINICAL DATA:  Right shoulder pain EXAM: RIGHT SHOULDER - 2+ VIEW COMPARISON:  None. FINDINGS: There is no evidence of fracture or dislocation. There is no evidence of arthropathy or other focal bone abnormality. Soft tissues are unremarkable. IMPRESSION: Negative. Electronically Signed   By: Donavan Foil M.D.   On: 02/10/2020 19:07    Procedures Procedures (including critical care time)  Medications Ordered in UC Medications  ketorolac (TORADOL) 30 MG/ML injection 30 mg (has no administration in time range)    Initial Impression /  Assessment and Plan / UC Course  I have reviewed the triage vital signs and the nursing notes.  Pertinent labs & imaging results that were available during my care of the patient were reviewed by me and considered in my medical decision making (see chart for details).     UA without hemoglobin or signs of infection, patient concerned about his blood sugar and kidney function will check BMP.  Point-of-care sugars 117.  Suspect likely more MSK etiology given worse with movement as well as tender to palpation.  Has radicular distribution into right leg.  Has not taken medicines will initiate treatment with NSAIDs, Toradol prior to discharge, Naprosyn twice daily.  Flexeril to supplement.  Discussed strict return precautions. Patient verbalized understanding and is agreeable with plan.  Final Clinical Impressions(s) / UC Diagnoses   Final diagnoses:  Right flank pain  Acute right-sided low back pain with right-sided sciatica     Discharge Instructions     Urine normal, blood sugar 117.  I am checking her kidney function-I will only call if this is abnormal Naprosyn twice daily with food for pain You may use flexeril as needed to help with pain. This is a muscle relaxer and causes sedation- please use only at bedtime or when you will be home and not have to drive/work Alternate ice and heat to area Follow-up if pain not improving or worsening    ED Prescriptions    Medication Sig Dispense Auth. Provider   naproxen (NAPROSYN) 500 MG tablet Take 1 tablet (500 mg total) by mouth 2 (two) times daily. 30 tablet Zanai Mallari C, PA-C   cyclobenzaprine (FLEXERIL) 5 MG tablet Take 1-2 tablets (5-10 mg total) by mouth 2 (two) times daily as needed for muscle spasms. 24 tablet Kalynne Womac, [place] C, PA-C     PDMP not reviewed this encounter.   Janith Lima, PA-C 02/11/20 1525

## 2020-02-11 NOTE — Discharge Instructions (Signed)
Urine normal, blood sugar 117.  I am checking her kidney function-I will only call if this is abnormal Naprosyn twice daily with food for pain You may use flexeril as needed to help with pain. This is a muscle relaxer and causes sedation- please use only at bedtime or when you will be home and not have to drive/work Alternate ice and heat to area Follow-up if pain not improving or worsening

## 2020-02-11 NOTE — ED Triage Notes (Signed)
Pt present back and Hip pain, symptoms started yesterday. Pt states the pain is making him have SOB. The pain starts from his back and goes all the way down to his legs.

## 2020-02-15 DIAGNOSIS — M25511 Pain in right shoulder: Secondary | ICD-10-CM | POA: Insufficient documentation

## 2020-02-15 NOTE — Assessment & Plan Note (Addendum)
Due to injury at work. likely muscular in nature but will obtain x-rays to rule out fracture which were negative. Providing patient with anti-inflammatory and muscle relaxer to help treat symptoms and assist with sleeping - recommended heating pad and rest - provided with cervical strain rehab exercises - return in 3-4 weeks if not improving with these measures

## 2020-03-02 ENCOUNTER — Telehealth: Payer: Self-pay

## 2020-03-02 NOTE — Telephone Encounter (Signed)
    MA9/15/2021 1st Attempt  Name: Aaron Reyes   MRN: [id number]   DOB: Jul 19, 1975   AGE: 44 y.o.   GENDER: male   PCP Melene Plan, MD.   03/02/20 Attempted to call patient with assistance from Arabic interpreter regarding resources for utility assistance.  Interpreter left message on voicemail that I will call again 03/03/20.    Kennieth Plotts, AAS Paralegal, [facility] Care Guide . Embedded Care Coordination [facility]  [address] [place], [place] [address] [email or web address]  [phone]   [email or web address]

## 2020-03-03 ENCOUNTER — Ambulatory Visit (INDEPENDENT_AMBULATORY_CARE_PROVIDER_SITE_OTHER): Payer: Medicaid Other | Admitting: Licensed Clinical Social Worker

## 2020-03-03 ENCOUNTER — Encounter (HOSPITAL_COMMUNITY): Payer: Self-pay | Admitting: Licensed Clinical Social Worker

## 2020-03-03 ENCOUNTER — Other Ambulatory Visit: Payer: Self-pay

## 2020-03-03 DIAGNOSIS — F411 Generalized anxiety disorder: Secondary | ICD-10-CM

## 2020-03-03 DIAGNOSIS — F332 Major depressive disorder, recurrent severe without psychotic features: Secondary | ICD-10-CM

## 2020-03-03 DIAGNOSIS — F4310 Post-traumatic stress disorder, unspecified: Secondary | ICD-10-CM | POA: Diagnosis not present
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
## 2020-03-03 NOTE — Patient Instructions (Signed)
Managing Post-Traumatic Stress Disorder If you have been diagnosed with post-traumatic stress disorder (PTSD), you may be relieved that you now know why you have felt or behaved a certain way. Still, you may feel overwhelmed about the treatment ahead. You may also wonder how to get the support you need and how to deal with the condition day-to-day. If you are living with PTSD, there are ways to help you recover from it and manage your symptoms. How to manage lifestyle changes Managing stress Stress is your body's reaction to life changes and events, both good and bad. Stress can make PTSD worse. Take the following steps to manage stress:  Talk with your health care provider or a counselor if you would like to learn more about techniques to reduce your stress. He or she may suggest some stress reduction techniques such as: ? Muscle relaxation exercises. ? Regular exercise. ? Meditation, yoga, or other mind-body exercises. ? Breathing exercises. ? Listening to quiet music. ? Spending time outside.  Maintain a healthy lifestyle. Eat a healthy diet, exercise regularly, get plenty of sleep, and take time to relax.  Spend time with others. Talk with them about how you are feeling and what kind of support you need. Try not to isolate yourself, even though you may feel like doing that. Isolating yourself can delay your recovery.  Do activities and hobbies that you enjoy.  Pace yourself when doing stressful things. Take breaks, and reward yourself when you finish. Make sure that you do not overload your schedule.  Medicines Your health care provider may suggest certain medicines if he or she feels that they will help to improve your condition. Medicines for depression (antidepressants) or severe loss of contact with reality (antipsychotics) may be used to treat PTSD. Avoid using alcohol and other substances that may prevent your medicines from working properly. It is also important to:  Talk with  your pharmacist or health care provider about all medicines that you take, their possible side effects, and which medicines are safe to take together.  Make it your goal to take part in all treatment decisions (shared decision-making). Ask about possible side effects of medicines that your health care provider recommends, and tell him or her how you feel about having those side effects. It is best if shared decision-making with your health care provider is part of your total treatment plan. If your health care provider prescribes a medicine, you may not notice the full benefits of it for 4-8 weeks. Most people who are treated for PTSD need to take medicine for at least 6-12 months after they feel better. If you are taking medicines as part of your treatment, do not stop taking medicines before you ask your health care provider if it is safe to stop. You may need to have the medicine slowly decreased (tapered) over time to lower the risk of harmful side effects. Relationships Many people who have PTSD have difficulty trusting others. Make an effort to:  Take risks and develop trust with close friends and family members. Developing trust in others can help you feel safe and connect you with emotional support.  Be open and honest about your feelings.  Have fun and relax in safe spaces, such as with friends and family.  Think about going to couples counseling, family education classes, or family therapy. Your loved ones may not always know how to be supportive. Therapy can be helpful for everyone. How to recognize changes in your condition Be aware of your   symptoms and how often you have them. The following symptoms mean that you need to seek help for your PTSD:  You feel suspicious and angry.  You have repeated flashbacks.  You avoid going out or being with others.  You have an increasing number of fights with close friends or family members, such as your spouse.  You have thoughts about  hurting yourself or others.  You cannot get relief from feelings of depression or anxiety. Follow these instructions at home: Lifestyle  Exercise regularly. Try to do 30 or more minutes of physical activity on most days of the week.  Try to get 7-9 hours of sleep each night. To help with sleep: ? Keep your bedroom cool and dark. ? Avoid screen time before bedtime. This means avoiding use of your TV, computer, tablet, and cell phone.  Practice self-soothing skills and use them daily.  Try to have fun and seek humor in your life. Eating and drinking  Do not eat a heavy meal during the hour before you go to bed.  Do not drink alcohol or caffeinated drinks before bed.  Avoid using alcohol or drugs. General instructions  If your PTSD is affecting your marriage or family, seek help from a family therapist.  Take over-the-counter and prescription medicines only as told by your health care provider.  Make sure to let all of your health care providers know that you have PTSD. This is especially important if you are having surgery or need to be admitted to the hospital.  Keep all follow-up visits as told by your health care providers. This is important. Where to find support Talking to others  Explain that PTSD is a mental health problem. It is something that a person can develop after experiencing or seeing a life-threatening event. Tell them that PTSD makes you feel stress like you did during the event.  Talk to your loved ones about the symptoms you have. Also tell them what things or situations can cause symptoms to start (are triggers for you).  Assure your loved ones that there are treatments to help PTSD. Discuss possibly seeking family therapy or couples therapy.  If you are worried or fearful about seeking treatment, ask for support.  Keep daily contact with at least one trusted friend or family member. Finances Not all insurance plans cover mental health care, so it is  important to check with your insurance carrier. If paying for co-pays or counseling services is a problem, search for a local or county mental health care center. Public mental health care services may be offered there at a low cost or no cost when you are not able to see a private health care provider. If you are a veteran, contact a local veterans organization or veterans hospital for more information. If you are taking medicine for PTSD, you may be able to get the genericform, which may be less expensive than brand-name medicine. Some makers of prescription medicines also offer help to patients who cannot afford the medicines that they need. Therapy and support groups  Find a support group in your community. Often, groups are available for military veterans, trauma victims, and family members or caregivers.  Look into volunteer opportunities. Taking part in these can help you feel more connected to your community.  Contact a local organization to find out if you are eligible for a service dog. Where to find more information Go to this website to find more information about PTSD, treatment of PTSD, and how to   get support:  National Center for PTSD: www.ptsd.va.gov Contact a health care provider if:  Your symptoms get worse or do not get better. Get help right away if:  You have thoughts about hurting yourself or others. If you ever feel like you may hurt yourself or others, or have thoughts about taking your own life, get help right away. You can go to your nearest emergency department or call:  Your local emergency services (911 in the [place]  A suicide crisis helpline, such as the National Suicide Prevention Lifeline at 1-800-273-8255. This is open 24-hours a day. Summary  If you are living with PTSD, there are ways to help you recover from it and manage your symptoms.  Find supportive environments and people who understand PTSD. Spend time in those places, and maintain contact with  those people.  Work with your health care team to create a plan for managing PTSD. The plan should include counseling, stress reduction techniques, and healthy lifestyle habits. This information is not intended to replace advice given to you by your health care provider. Make sure you discuss any questions you have with your health care provider. Document Revised: 09/26/2018 Document Reviewed: 10/04/2016 Elsevier Patient Education  2020 Elsevier Inc.  

## 2020-03-03 NOTE — Telephone Encounter (Signed)
° °   MA9/16/2021    Name: Almalik Weissberg    MRN: [id number]    DOB: [birth year]    AGE: 44 y.o.    GENDER: male    PCP Melene Plan, MD.   03/03/20 Spoke with patient with assistance from an interpreter. Emailed resources for [employer] and rent, [employer], Scientist, research (life sciences), Runner, broadcasting/film/video Assistance to [employer] to mail to patient. Letter is in Arabic. Will follow-up with patient next week to ensure patient received resources. Olean Ree [phone]   Kyriana Yankee Pernell Dupre, AAS Paralegal, [facility] Care Guide  Embedded Care Coordination [facility]   Care Management  [address] [place], [place] [address] [email or web address]   [phone]   [email or web address]

## 2020-03-04 ENCOUNTER — Ambulatory Visit (INDEPENDENT_AMBULATORY_CARE_PROVIDER_SITE_OTHER): Payer: Medicaid Other | Admitting: Family Medicine

## 2020-03-04 ENCOUNTER — Encounter: Payer: Self-pay | Admitting: Family Medicine

## 2020-03-04 VITALS — BP 122/68 | HR 79 | Ht 65.0 in | Wt 141.0 lb

## 2020-03-04 DIAGNOSIS — F411 Generalized anxiety disorder: Secondary | ICD-10-CM

## 2020-03-04 DIAGNOSIS — E119 Type 2 diabetes mellitus without complications: Secondary | ICD-10-CM | POA: Diagnosis not present

## 2020-03-04 DIAGNOSIS — R5383 Other fatigue: Secondary | ICD-10-CM | POA: Diagnosis not present

## 2020-03-04 LAB — POCT GLYCOSYLATED HEMOGLOBIN (HGB A1C): HbA1c, POC (controlled diabetic range): 7 % (ref 0.0–7.0)

## 2020-03-04 MED ORDER — MULTIVITAMIN ADULT PO TABS: 1.0000 | ORAL_TABLET | Freq: Every day | ORAL | 11 refills | Status: DC

## 2020-03-04 NOTE — Patient Instructions (Addendum)
Make sure you ask about counseling resources to help with your stress.   I have sent you vitamins to your pharmacy.    Therapy and Counseling Resources Most providers on this list will take Medicaid. Patients with commercial insurance or Medicare should contact their insurance company to get a list of in network providers.  [facility]Day:Psychiatry and Counseling [address] [facility] [address] [address] [place], [place] [address] [phone]  [facility]  [address], [address] [place], [place] [address]      [phone]  [facility] [address]  [place], [place] [address] [phone]  [facility] [address]  [place], [place] [address]       [phone]      Jovita Kussmaul [facility] [address], [place], [place] [phone]  [facility]:  [address] [place] [phone]  [facility]:  [address] [place] Diener [phone]  [facility] (under & uninsured) [address]   [place] [phone]    [email or web address]    [facility] [address] . [place]    [phone]  [facility] -[address]     Phone:  [phone]     [facility]-  [address]  [phone]   [facility] [address]      [place], [place] [phone] ext [phone]  [facility]: [address] [place], [place], [place]  [phone]   [facility] (Spanish therapist) [address]   [place] [address]    [phone]    [facility]   [address],  [place], [place]  [phone]   [facility]  [address] [place]  [phone]  [facility]  [address] [place], [place]        [phone]  Open Access/Walk In Clinic under & uninsured  [facility]   [address] [place], [place] [phone] Crisis [phone]  [facility] [place],  (Spanish)   [address], [place]: [phone]) 8:30 - 12; 1 - 2:30  [facility] [employer],  [address] [place], [place]    [phone]):8:30 - 12; 2 - 3PM  [facility] [employer],  [address]  [place]; [phone]):   Mon - Fri 8 AM - 5 PM  [facility] [address] [place]  MWF 12:30 to 3:00 or call to schedule an appointment  [phone]  Specific Provider options Psychology Today  [email or web address] 1. click on find a therapist  2. enter your zip code 3. left side and select or tailor a therapist for your specific need.   [facility] Provider Directory [email or web address]  (Medicaid)   Follow all drop down to find a provider  Social Support program [facility] [phone] or PhotoS[employer].pl [address], [place], [place] Recovery support and educational   24- Hour Availability:  .  Marland Kitchen [facility]  . [address] [place], [place] [employer] [phone] Crisis [phone]  . [facility] [phone]  [facility] Crisis Service  [phone]   . [facility] [employer]  [phone] (after hours)  . Therapeutic Alternative/Mobile Crisis   [phone]  . [place] National Suicide  Hotline  [phone] (TALK)  . Call 911 or go to emergency room  . [employer]  [phone]);  [place] and [employer]   . [facility]  [phone]); [place], [place], [place], [place], [place], [place], [place]

## 2020-03-04 NOTE — Progress Notes (Signed)
    SUBJECTIVE:  CHIEF COMPLAINT / HPI:   1. Diabetes mellitus without complication (HCC) Currently taking Metformin XR 1,000 mg daily.   2. Tired/Depression  Body feels very tired. Mentally also feeling very tired. Working 8 hours, but feeling so tired that he is trying just to work 6 hours.    PERTINENT  PMH / PSH: PTSD, GERD   OBJECTIVE:  BP 122/68   Pulse 79   Ht 5\' 5"  (1.651 m)   Wt 141 lb (64 kg)   SpO2 98%   BMI 23.46 kg/m   General: well appearing male. Appears anxious, which is usually baseline for him at his visits.    ASSESSMENT/PLAN:  Fatigue Patient reports excessive fatigue. He has difficulty sleeping at night. This has been a chronic problem for him in the past. He also has known anxiety and depression. He is currently working with a psychiatrist at this time and is not on any medications for his anxiety or depression. Patient is requesting multivitamins to see if this improves how he is feeling. Patient has multiple causes of stress in his life that are not really in his control. It appears that he is going to be receiving a letter that will help him with community resources to help with affording basic needs for his family. I do not believe his stress will improve until he is able to get these resources and additionally, patient will need to continue working with psychiatry and also actively work to get a counselor to help with tools for his stress. I have provided him with a list of counselors and have strongly encouraged him to take the effort to find someone who will accept his insurance.  Diabetes mellitus without complication (HCC) A1c today is 7.0. Mildly increased from last at 6.2. We will continue Metformin 1000 mg daily.    , MD [facility]

## 2020-03-08 ENCOUNTER — Encounter: Payer: Self-pay | Admitting: Family Medicine

## 2020-03-08 DIAGNOSIS — R5383 Other fatigue: Secondary | ICD-10-CM | POA: Insufficient documentation

## 2020-03-08 NOTE — Assessment & Plan Note (Signed)
Patient reports excessive fatigue. He has difficulty sleeping at night. This has been a chronic problem for him in the past. He also has known anxiety and depression. He is currently working with a psychiatrist at this time and is not on any medications for his anxiety or depression. Patient is requesting multivitamins to see if this improves how he is feeling. Patient has multiple causes of stress in his life that are not really in his control. It appears that he is going to be receiving a letter that will help him with community resources to help with affording basic needs for his family. I do not believe his stress will improve until he is able to get these resources and additionally, patient will need to continue working with psychiatry and also actively work to get a counselor to help with tools for his stress. I have provided him with a list of counselors and have strongly encouraged him to take the effort to find someone who will accept his insurance.

## 2020-03-08 NOTE — Assessment & Plan Note (Signed)
A1c today is 7.0. Mildly increased from last at 6.2. We will continue Metformin 1000 mg daily.

## 2020-03-11 ENCOUNTER — Encounter: Payer: Self-pay | Admitting: Family Medicine

## 2020-03-11 ENCOUNTER — Other Ambulatory Visit: Payer: Self-pay

## 2020-03-11 ENCOUNTER — Ambulatory Visit (INDEPENDENT_AMBULATORY_CARE_PROVIDER_SITE_OTHER): Payer: Medicaid Other | Admitting: Family Medicine

## 2020-03-11 VITALS — BP 112/74 | HR 87

## 2020-03-11 DIAGNOSIS — Z0289 Encounter for other administrative examinations: Secondary | ICD-10-CM

## 2020-03-14 ENCOUNTER — Telehealth: Payer: Self-pay

## 2020-03-14 NOTE — Telephone Encounter (Signed)
    MA9/27/2021 1st Attempt  Name: Lynell Kussman   MRN: [id number]   DOB: 06/13/1976   AGE: 44 y.o.   GENDER: male   PCP Melene Plan, MD.   03/14/20  Attempted to call patient with assistance from Arabic interpreter regarding resources for utility assistance.  Interpreter left message on voicemail that I will call again this week.   Loxley Schmale, AAS Paralegal, [facility] Care Guide . Embedded Care Coordination [facility]  Care Management  [address] [place], [place] [address] [email or web address]  [phone]   [email or web address]

## 2020-03-15 ENCOUNTER — Encounter: Payer: Self-pay | Admitting: Family Medicine

## 2020-03-15 NOTE — Progress Notes (Addendum)
° ° °  SUBJECTIVE:   CHIEF COMPLAINT / HPI:   N 648 forms  Patient is accompanied with interpreter and wife today.  He presents for N648 forms.  Explained to patient that these forms are very specific as to what is required and that only one of the physicians in our office fills these forms out and only about once a month.  She does not have any upcoming appointments.  Patient recently received the forms and has 2 years to fill them out.    Patient does see a psychiatrist, Dr. Evelene Croon that he recently established with.  His next appointment with him is on 03/16/2020.  Have suggested that patient ask his psychiatrist if he would be willing to fill out these forms as they will be addressing his PTSD.    PERTINENT  PMH / PSH: PTSD, HTN, GAD  OBJECTIVE:   BP 112/74    Pulse 87    SpO2 98%   General: Well-appearing, no acute distress  ASSESSMENT/PLAN:    N 648 forms  Patient should contact us if his psychiatrist is unable to fill out the N 648 forms for him. Spoke with Dr. Manson Passey about this patient and it is unclear that he will meet the criteria as he is currently working.  Will continue to follow.   Patient also presents with several of the pieces male that he would like me to review.  I have encouraged him to bring his questions up with social work and congregational nurses who follow him and his family.  He does not have to wait until a visit with me to address these issues.  He was previously unaware that he could address these issues with his other resources, but is now aware.  Melene Plan, MD [facility]

## 2020-03-16 ENCOUNTER — Ambulatory Visit (INDEPENDENT_AMBULATORY_CARE_PROVIDER_SITE_OTHER): Payer: Medicaid Other | Admitting: Psychiatry

## 2020-03-16 ENCOUNTER — Other Ambulatory Visit: Payer: Self-pay

## 2020-03-16 ENCOUNTER — Encounter (HOSPITAL_COMMUNITY): Payer: Self-pay | Admitting: Psychiatry

## 2020-03-16 VITALS — BP 133/96 | HR 81 | Ht 65.0 in | Wt 141.0 lb

## 2020-03-16 DIAGNOSIS — F339 Major depressive disorder, recurrent, unspecified: Secondary | ICD-10-CM | POA: Diagnosis not present

## 2020-03-16 DIAGNOSIS — F4310 Post-traumatic stress disorder, unspecified: Secondary | ICD-10-CM

## 2020-03-16 MED ORDER — LORAZEPAM 0.5 MG PO TABS: 0.5000 mg | ORAL_TABLET | Freq: Two times a day (BID) | ORAL | 1 refills | Status: DC | PRN

## 2020-03-16 MED ORDER — TRAZODONE HCL 50 MG PO TABS: 50.0000 mg | ORAL_TABLET | Freq: Every evening | ORAL | 1 refills | Status: DC | PRN

## 2020-03-16 MED ORDER — ESCITALOPRAM OXALATE 10 MG PO TABS: 10.0000 mg | ORAL_TABLET | Freq: Every day | ORAL | 1 refills | Status: DC
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
## 2020-03-17 ENCOUNTER — Telehealth: Payer: Self-pay

## 2020-03-17 NOTE — Telephone Encounter (Signed)
    MA9/30/2021   Name: Aaron Reyes   MRN: [id number]   DOB: 1975-08-14   AGE: 44 y.o.   GENDER: male   PCP Melene Plan, MD.   03/16/20 Spoke with patient with assistance from Arabic interpreter. Patient received resources in the mail for utlity and rent assistance that were translated in Arabic. Will call patient in the next few days.    Elinda Bunten, AAS Paralegal, [facility] Care Guide . Embedded Care Coordination [facility]  Care Management  [address] [place], [place] [address] [email or web address]  [phone]   [email or web address]

## 2020-03-23 ENCOUNTER — Telehealth: Payer: Self-pay

## 2020-03-23 NOTE — Telephone Encounter (Signed)
    MA10/11/2019  Name: Aaron Reyes   MRN: [id number]   DOB: Aug 28, 1975   AGE: 44 y.o.   GENDER: male   PCP Melene Plan, MD.   03/23/20 Spoke with patient via an interpreter to let hime know I spoke with [place] DSS and they will have an interpreter contact him today.  I also let him know they attempted to call him yesterday and have tried to call him several times.  He is aware that he can also go in person and DSS will have an interpreter assist him.  No other resources are needed at this time.  Closing referral.    Idora Brosious, AAS Paralegal, [facility] Care Guide . Embedded Care Coordination [facility]  Care Management  [address] [place], [place] [address] [email or web address]  [phone]   [email or web address]

## 2020-04-07 ENCOUNTER — Ambulatory Visit (HOSPITAL_COMMUNITY): Payer: Medicaid Other | Admitting: Licensed Clinical Social Worker

## 2020-04-13 ENCOUNTER — Ambulatory Visit: Payer: Medicaid Other | Admitting: Family Medicine

## 2020-05-04 ENCOUNTER — Encounter (HOSPITAL_COMMUNITY): Payer: Medicaid Other | Admitting: Psychiatry

## 2020-05-19 ENCOUNTER — Emergency Department (HOSPITAL_COMMUNITY)
Admission: EM | Admit: 2020-05-19 | Discharge: 2020-05-19 | Disposition: A | Discharge status: Home / Self Care | Payer: Medicaid Other | Attending: Emergency Medicine | Admitting: Emergency Medicine

## 2020-05-19 ENCOUNTER — Encounter (HOSPITAL_COMMUNITY): Payer: Self-pay | Admitting: Emergency Medicine

## 2020-05-19 DIAGNOSIS — Z5321 Procedure and treatment not carried out due to patient leaving prior to being seen by health care provider: Secondary | ICD-10-CM | POA: Diagnosis not present

## 2020-05-19 DIAGNOSIS — R079 Chest pain, unspecified: Secondary | ICD-10-CM | POA: Insufficient documentation

## 2020-05-19 DIAGNOSIS — M25562 Pain in left knee: Secondary | ICD-10-CM | POA: Diagnosis not present

## 2020-05-19 DIAGNOSIS — R519 Headache, unspecified: Secondary | ICD-10-CM | POA: Insufficient documentation

## 2020-05-19 DIAGNOSIS — M25561 Pain in right knee: Secondary | ICD-10-CM | POA: Insufficient documentation

## 2020-05-19 DIAGNOSIS — Y92410 Unspecified street and highway as the place of occurrence of the external cause: Secondary | ICD-10-CM | POA: Insufficient documentation

## 2020-05-19 DIAGNOSIS — M79602 Pain in left arm: Secondary | ICD-10-CM | POA: Insufficient documentation

## 2020-05-19 DIAGNOSIS — M79601 Pain in right arm: Secondary | ICD-10-CM | POA: Insufficient documentation
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
## 2020-05-19 NOTE — ED Notes (Signed)
Pt self-removed [facility].

## 2020-05-19 NOTE — ED Notes (Signed)
Pt tells nursing staff he is leaving

## 2020-05-19 NOTE — ED Notes (Signed)
Patient decided to leave.   
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
## 2020-05-25 ENCOUNTER — Ambulatory Visit (INDEPENDENT_AMBULATORY_CARE_PROVIDER_SITE_OTHER): Payer: Medicaid Other | Admitting: Family Medicine

## 2020-05-25 ENCOUNTER — Other Ambulatory Visit: Payer: Self-pay

## 2020-05-25 VITALS — BP 130/82 | HR 99 | Wt 142.0 lb

## 2020-05-25 DIAGNOSIS — E119 Type 2 diabetes mellitus without complications: Secondary | ICD-10-CM | POA: Diagnosis present

## 2020-05-25 DIAGNOSIS — Z0289 Encounter for other administrative examinations: Secondary | ICD-10-CM

## 2020-05-25 DIAGNOSIS — F339 Major depressive disorder, recurrent, unspecified: Secondary | ICD-10-CM

## 2020-05-25 DIAGNOSIS — F4310 Post-traumatic stress disorder, unspecified: Secondary | ICD-10-CM | POA: Diagnosis not present

## 2020-05-25 MED ORDER — ASPIRIN 81 MG PO CHEW: 81.0000 mg | CHEWABLE_TABLET | Freq: Every day | ORAL | 3 refills | Status: DC

## 2020-05-25 MED ORDER — ESCITALOPRAM OXALATE 10 MG PO TABS: 10.0000 mg | ORAL_TABLET | Freq: Every day | ORAL | 1 refills | Status: DC

## 2020-05-25 MED ORDER — PANTOPRAZOLE SODIUM 40 MG PO TBEC
40.0000 mg | DELAYED_RELEASE_TABLET | Freq: Two times a day (BID) | ORAL | 3 refills | Status: DC
Start: 2020-05-25 — End: 2020-07-07

## 2020-05-25 MED ORDER — ROSUVASTATIN CALCIUM 20 MG PO TABS
20.0000 mg | ORAL_TABLET | Freq: Every day | ORAL | 3 refills | Status: DC
Start: 2020-05-25 — End: 2020-07-07

## 2020-05-25 MED ORDER — NAPROXEN 500 MG PO TABS
500.0000 mg | ORAL_TABLET | Freq: Two times a day (BID) | ORAL | 0 refills | Status: DC
Start: 2020-05-25 — End: 2020-08-01

## 2020-05-25 MED ORDER — METFORMIN HCL ER 500 MG PO TB24
1000.0000 mg | ORAL_TABLET | Freq: Every day | ORAL | 3 refills | Status: DC
Start: 2020-05-25 — End: 2020-07-07

## 2020-05-25 NOTE — Patient Instructions (Signed)
I have provided you with a letter to give to the [facility] regarding your ability to drive.  I recommend a follow-up appointment with your primary care physician in the next 2-3 months.  I have refilled your medications as requested.

## 2020-05-27 ENCOUNTER — Ambulatory Visit: Payer: Medicaid Other | Admitting: Family Medicine

## 2020-05-27 DIAGNOSIS — Z0289 Encounter for other administrative examinations: Secondary | ICD-10-CM | POA: Insufficient documentation

## 2020-05-27 NOTE — Assessment & Plan Note (Signed)
Metformin refilled

## 2020-05-27 NOTE — Assessment & Plan Note (Signed)
Reviewed cardiac medical history, medications and imaging.  Patient provided with letter to submit to [facility] allowing for him to drive given no documented history of cardiac condition that should prevent him from driving
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
## 2020-05-27 NOTE — Assessment & Plan Note (Signed)
Provided refills for lexapro

## 2020-05-31 NOTE — Progress Notes (Deleted)
    SUBJECTIVE:   CHIEF COMPLAINT / HPI:   Back/neck pain: Patient is a 44 year old male the presents today to discuss back and neck pain which started***.  Arabic interpreter used for duration of patient encounter.***  PERTINENT  PMH / PSH: ***  OBJECTIVE:   There were no vitals taken for this visit.   General: NAD, pleasant, able to participate in exam Cardiac: RRR, no murmurs. Respiratory: CTAB, normal effort, No wheezes, rales or rhonchi Abdomen: Bowel sounds present, nontender, nondistended, no hepatosplenomegaly. Extremities: no edema or cyanosis. Skin: warm and dry, no rashes noted Neuro: alert, no obvious focal deficits Psych: Normal affect and mood  ASSESSMENT/PLAN:   No problem-specific Assessment & Plan notes found for this encounter.     Jackelyn Poling, DO [facility]    This note was prepared using Dragon voice recognition software and may include unintentional dictation errors due to the inherent limitations of voice recognition software.

## 2020-06-01 ENCOUNTER — Ambulatory Visit: Payer: Medicaid Other | Admitting: Family Medicine

## 2020-06-06 ENCOUNTER — Ambulatory Visit: Payer: Medicaid Other | Admitting: Family Medicine

## 2020-06-21 ENCOUNTER — Ambulatory Visit: Payer: Medicaid Other | Admitting: Family Medicine

## 2020-07-05 ENCOUNTER — Other Ambulatory Visit: Payer: Self-pay

## 2020-07-05 ENCOUNTER — Ambulatory Visit: Payer: Medicaid Other | Admitting: Family Medicine

## 2020-07-07 ENCOUNTER — Encounter: Payer: Self-pay | Admitting: Family Medicine

## 2020-07-07 ENCOUNTER — Other Ambulatory Visit: Payer: Self-pay

## 2020-07-07 ENCOUNTER — Ambulatory Visit (INDEPENDENT_AMBULATORY_CARE_PROVIDER_SITE_OTHER): Payer: Medicaid Other | Admitting: Family Medicine

## 2020-07-07 VITALS — BP 102/60 | HR 84 | Wt 142.0 lb

## 2020-07-07 DIAGNOSIS — M25561 Pain in right knee: Secondary | ICD-10-CM

## 2020-07-07 DIAGNOSIS — Z23 Encounter for immunization: Secondary | ICD-10-CM

## 2020-07-07 DIAGNOSIS — E119 Type 2 diabetes mellitus without complications: Secondary | ICD-10-CM | POA: Diagnosis present

## 2020-07-07 DIAGNOSIS — K219 Gastro-esophageal reflux disease without esophagitis: Secondary | ICD-10-CM | POA: Diagnosis not present

## 2020-07-07 DIAGNOSIS — M25562 Pain in left knee: Secondary | ICD-10-CM | POA: Diagnosis not present

## 2020-07-07 DIAGNOSIS — G8929 Other chronic pain: Secondary | ICD-10-CM | POA: Diagnosis not present

## 2020-07-07 LAB — POCT UA - MICROALBUMIN
Albumin/Creatinine Ratio, Urine, POC: <30
Creatinine, POC: 300 mg/dL
Microalbumin Ur, POC: 30 mg/L

## 2020-07-07 LAB — POCT GLYCOSYLATED HEMOGLOBIN (HGB A1C): Hemoglobin A1C: 7.1 % — AB (ref 4.0–5.6)

## 2020-07-07 MED ORDER — ASPIRIN 81 MG PO CHEW: 81.0000 mg | CHEWABLE_TABLET | Freq: Every day | ORAL | 3 refills | Status: DC

## 2020-07-07 MED ORDER — PANTOPRAZOLE SODIUM 40 MG PO TBEC
40.0000 mg | DELAYED_RELEASE_TABLET | Freq: Two times a day (BID) | ORAL | 3 refills | Status: DC
Start: 2020-07-07 — End: 2021-08-30

## 2020-07-07 MED ORDER — METFORMIN HCL ER 500 MG PO TB24
1000.0000 mg | ORAL_TABLET | Freq: Every day | ORAL | 3 refills | Status: DC
Start: 2020-07-07 — End: 2021-08-30

## 2020-07-07 MED ORDER — DICLOFENAC SODIUM 1 % EX GEL: 2.0000 g | Freq: Four times a day (QID) | CUTANEOUS | 5 refills | Status: DC

## 2020-07-07 MED ORDER — ROSUVASTATIN CALCIUM 20 MG PO TABS
20.0000 mg | ORAL_TABLET | Freq: Every day | ORAL | 3 refills | Status: DC
Start: 2020-07-07 — End: 2021-08-30

## 2020-07-07 NOTE — Patient Instructions (Signed)
You can get your X rays at any time when the imaging center is open.  Monday through Friday from 9am to 5pm is when they should be open.  I have prescribed a gel to rub on your knees.  It is the same one I prescribed to your wife for her back.    I will call you when I have scheduled something with the social worker.    Have a great day,   Frederic Jericho, MD

## 2020-07-07 NOTE — Progress Notes (Signed)
    SUBJECTIVE:   CHIEF COMPLAINT / HPI:   Bilateral knee pain: Ongoing for past 2-3 months.  Back of calves and knees. Works standing all day at a machine.  Pain is worse after a day of working.    Heart burn: Takes medication but not sure what it is called.  He takes it at least once a day and sometimes twice a day depending on symptoms.  Has tried tums in the past but this did not help his symptoms.  PTSD: Patient states he "fogets everything" and feels sick "always" since witnessing his son get attacked by a dog.  Both he and his wife sees a Therapist, sports at [employer].  Patient presents paperwork related to his psychiatric illness and his immigration status.  States he needs to have an appointment with a "social worker" to help him fill out this paperwork.  PERTINENT  PMH / PSH: PTSD, reflux  OBJECTIVE:   BP 102/60   Pulse 84   Wt 142 lb (64.4 kg)   SpO2 97%   BMI 23.63 kg/m   General: Alert and oriented.  No acute distress.  Accompanied by wife.  Video translator present. Psych: Pleasant.  Patient appears distressed when discussing his son and paperwork for immigration status.  Is very appreciative of the help we have provided.  ASSESSMENT/PLAN:   Chronic pain of both knees Patient states that she was ongoing for past 2 to 3 months but there is a note from 2018 that shows similar complaint.  Unable to do adequate musculoskeletal exam given the amount of time spent discussing his and his wife's legal paperwork.  Likely multifactorial with component of arthritis compounded by psychiatric issues.  We will get bilateral standing knee x-rays.  Prescribed diclofenac gel.  GERD (gastroesophageal reflux disease) Patient taking once a day PPI with second dose as needed.  Try to discussed with patient the need to reduce chronic PPI use.  Patient does not want to change prescription at this time.  Refilled patient's PPI.  Advised him to try to limit to once a day as often as possible.  We will  need to readdress this in future visits.     Immigration status Patient and his wife presented paperwork they state they need help to fill out which will allow them to stay in the country.  They state a "Child psychotherapist" helps him with this.  Discussed this with PCP, Dr. Selena Batten, who states they are talking about a meeting involving volunteer legal aids and is part of the immigration clinic.  These are held periodically she does not know when the next one will be held.  Will message Dr. Manson Passey, who is in charge of the immigration clinic, regarding further information.  We will call patients when we have an appointment time.   Sandre Kitty, MD [facility]

## 2020-07-09 NOTE — Assessment & Plan Note (Addendum)
Patient states that she was ongoing for past 2 to 3 months but there is a note from 2018 that shows similar complaint.  Unable to do adequate musculoskeletal exam given the amount of time spent discussing his and his wife's legal paperwork.  Likely multifactorial with component of arthritis compounded by psychiatric issues.  We will get bilateral standing knee x-rays.  Prescribed diclofenac gel.

## 2020-07-09 NOTE — Assessment & Plan Note (Signed)
Patient taking once a day PPI with second dose as needed.  Try to discussed with patient the need to reduce chronic PPI use.  Patient does not want to change prescription at this time.  Refilled patient's PPI.  Advised him to try to limit to once a day as often as possible.  We will need to readdress this in future visits.

## 2020-07-12 ENCOUNTER — Telehealth: Payer: Self-pay | Admitting: Family Medicine

## 2020-07-12 NOTE — Telephone Encounter (Signed)
I called [place] Immigration clinic and spoke with Lujean Rave regarding the patient's paperwork, which I believe are [phone] forms. She put the patient on their wait list and will contact them.  Also recommended I reach out to center for new [place], specifically Beatrix Shipper at [phone].    She also states that this paperwork is something an MD can fill out for them.   I will call pt and his wife to update them and have them bring in their paperwork for me to fill out if able and refer them to either [employer] or [place] immigration clinic if necessary.    Contact info for Ms. Mosquera is [phone].

## 2020-07-26 ENCOUNTER — Telehealth (HOSPITAL_COMMUNITY): Payer: Self-pay | Admitting: Psychiatry

## 2020-07-26 DIAGNOSIS — F339 Major depressive disorder, recurrent, unspecified: Secondary | ICD-10-CM

## 2020-07-26 DIAGNOSIS — E119 Type 2 diabetes mellitus without complications: Secondary | ICD-10-CM

## 2020-07-26 DIAGNOSIS — F4310 Post-traumatic stress disorder, unspecified: Secondary | ICD-10-CM

## 2020-07-26 LAB — GLUCOSE, POCT (MANUAL RESULT ENTRY): POC Glucose: 184 mg/dl — AB (ref 70–99)

## 2020-07-26 MED ORDER — LORAZEPAM 0.5 MG PO TABS: 0.5000 mg | ORAL_TABLET | Freq: Two times a day (BID) | ORAL | 1 refills | Status: DC | PRN

## 2020-07-26 MED ORDER — ESCITALOPRAM OXALATE 10 MG PO TABS: 10.0000 mg | ORAL_TABLET | Freq: Every day | ORAL | 1 refills | Status: DC

## 2020-07-26 NOTE — Congregational Nurse Program (Signed)
Patient was seen today for help with citizenship paperwork. BP and BG checked today. BP was 131/86. Pulse was 83. BG was 184 and last ate 1 hr ago.    Patient history of DM currently managed on Metformin XR 1000mg  BID.  Patient referred to social work at [facility] for help with paperwork.   RN [facility]  [facility] [phone]

## 2020-07-26 NOTE — Telephone Encounter (Signed)
Patient presented to the clinic requesting refills for his medications.  Patient was last seen by the writer back in September 2021.  Patient had no showed for his follow-up appointment after that. Patient was given appointment for next month and writer sent refills until his next visit.

## 2020-08-01 ENCOUNTER — Other Ambulatory Visit: Payer: Self-pay

## 2020-08-01 ENCOUNTER — Ambulatory Visit (INDEPENDENT_AMBULATORY_CARE_PROVIDER_SITE_OTHER): Payer: Medicaid Other | Admitting: Family Medicine

## 2020-08-01 VITALS — BP 128/88 | HR 87 | Wt 143.4 lb

## 2020-08-01 DIAGNOSIS — M25561 Pain in right knee: Secondary | ICD-10-CM

## 2020-08-01 DIAGNOSIS — Z760 Encounter for issue of repeat prescription: Secondary | ICD-10-CM | POA: Diagnosis not present

## 2020-08-01 DIAGNOSIS — G8929 Other chronic pain: Secondary | ICD-10-CM

## 2020-08-01 DIAGNOSIS — F4310 Post-traumatic stress disorder, unspecified: Secondary | ICD-10-CM | POA: Diagnosis not present

## 2020-08-01 DIAGNOSIS — M25562 Pain in left knee: Secondary | ICD-10-CM | POA: Diagnosis not present

## 2020-08-01 MED ORDER — NAPROXEN 500 MG PO TABS: 500.0000 mg | ORAL_TABLET | Freq: Two times a day (BID) | ORAL | 0 refills | Status: DC

## 2020-08-01 NOTE — Progress Notes (Signed)
SUBJECTIVE:   CHIEF COMPLAINT / HPI: knee pain   Aaron Reyes is a 45 year old male presenting for evaluation of the following: An Arabic interpreter was used for the duration of this visit.  Bilateral knee pain: Recently briefly seen for this concern on 1/20 in our office, recommended proceeding with standing knee x-rays and trial of Voltaren gel.  He reports an approximate 2-14-month history of bilateral anterior knee pain that seems to radiate to the back of his calves/knees.  Worse with bending his knees and standing for prolonged periods of time.  He does stand all day for work.  He denies any swelling, popping, locking, clicking, or instability.  He has not tried anything yet to make this better.  He previously did have naproxen back in December that seem to help when it first started.  He has not had any knee pain prior to this.  No preceding injury or trauma.  He is concerned that the stressors in his life are making him feel this pain.  He also spent the majority of the visit discussing his life stressors over the past several years in reference to getting paperwork completed for medical certification for disability exceptions to qualify for citizenship.  He reports they have attempted to get this paperwork filled many times however they keep being told to go to different places which always refer him back to his family provider.  This is caused a lot of stress in his family's life.  He is still following with therapy on a regular basis.  PERTINENT  PMH / PSH: T2DM, depression with severe anxiety, GERD, fatigue, language barrier  OBJECTIVE:   BP 128/88   Pulse 87   Wt 143 lb 6.4 oz (65 kg)   SpO2 99%   BMI 23.86 kg/m   General: Alert, NAD HEENT: NCAT, MMM Lungs: No increased WOB  Ext: Warm, dry, 2+ distal pulses  Bilateral Knees: - Inspection: no gross deformity. No swelling/effusion, erythema or bruising. Skin intact b/l.  No significant VMO atrophy. No genu valgus/varum  deformity.  - Palpation: no TTP to joint lines bilaterally, tender to palpation over bony patella with pressure - ROM: full active ROM with flexion and extension in knee and hip with normal gait.  - Strength: 5/5 strength - Neuro/vasc: NV intact - Special Tests: - LIGAMENTS: negative anterior and posterior drawer, negative Lachman's, no MCL or LCL laxity  -- MENISCUS: negative McMurray's -- PF JOINT: Difficult to assess patellar mobility due to clothes. Pain with patellar grind bilaterally.   ASSESSMENT/PLAN:   Chronic pain of both knees Anterior vague bilateral knee pain consistent with likely patellofemoral syndrome (possible additional somatic complaints with increased stressers), however given pain upon patellar grind could also consider concurrent underlying arthritis.  He did have unremarkable x-rays in 2018, however could have some mild progression of arthritis since that time.  Rx naproxen 500 mg BID x7 days and then PRN.  Ice/heat, elevate, and rest when can.  Provided with a few quad/VMO strengthening exercises with pictures that he can do at home.  Can proceed with knee x-rays as previously instructed.  Post traumatic stress disorder Unfortunately continues to have significant mental turmoil with several stressors since being in the [place], certainly has complicated their ability to proceed with qualifying for citizenship.  He and his wife are requesting medical certification for disability exception paperwork to be filled out, will attempt to get this completed through our clinic.  Medication refill Reports that he recently went to  his pharmacy to pick up several medications that were sent on 1/20 but they told him that he did not have any to pick up.  Called his pharmacy to discuss, reports he initially picked up a 90-day supply of these prescriptions on 12/9 and thus with his insurance would not be due until 3/9.  Will let him know that he can get his Metformin, Crestor, Protonix, and  aspirin at that time.    Will likely need an office appointment to further discuss his paperwork to ensure accuracy in the next 1-2 weeks.  Follow-up if knee pain is not improving or sooner if worsening.  Allayne Stack, DO [facility]

## 2020-08-02 ENCOUNTER — Encounter: Payer: Self-pay | Admitting: Family Medicine

## 2020-08-02 DIAGNOSIS — Z760 Encounter for issue of repeat prescription: Secondary | ICD-10-CM | POA: Insufficient documentation

## 2020-08-02 NOTE — Assessment & Plan Note (Signed)
Reports that he recently went to his pharmacy to pick up several medications that were sent on 1/20 but they told him that he did not have any to pick up.  Called his pharmacy to discuss, reports he initially picked up a 90-day supply of these prescriptions on 12/9 and thus with his insurance would not be due until 3/9.  Will let him know that he can get his Metformin, Crestor, Protonix, and aspirin at that time.

## 2020-08-02 NOTE — Assessment & Plan Note (Signed)
Unfortunately continues to have significant mental turmoil with several stressors since being in the [place], certainly has complicated their ability to proceed with qualifying for citizenship.  He and his wife are requesting medical certification for disability exception paperwork to be filled out, will attempt to get this completed through our clinic.

## 2020-08-02 NOTE — Assessment & Plan Note (Addendum)
Anterior vague bilateral knee pain consistent with likely patellofemoral syndrome (possible additional somatic complaints with increased stressers), however given pain upon patellar grind could also consider concurrent underlying arthritis.  He did have unremarkable x-rays in 2018, however could have some mild progression of arthritis since that time.  Rx naproxen 500 mg BID x7 days and then PRN.  Ice/heat, elevate, and rest when can.  Provided with a few quad/VMO strengthening exercises with pictures that he can do at home.  Can proceed with knee x-rays as previously instructed.

## 2020-08-16 ENCOUNTER — Telehealth: Payer: Self-pay | Admitting: *Deleted

## 2020-08-16 NOTE — Telephone Encounter (Signed)
-----   Message from Westley Chandler, MD sent at 08/11/2020  3:30 PM EST ----- Regarding: Form N 648 Hi Jessica and Rheana Casebolt,  This patient needs an N 648. Dr. Miquel Dunn and I will complete together if it works for you on March 15th at 1030.  Shanda Bumps-- can you please add a slot under Margo at 1030 and schedule him? Trang Bouse- can you please let him know?  If this day does not work for some reason, we can find another in Dy[place]nn Ketner.  We will schedule his wife separately and I will send another message about her--her visit will be more challenging.  Again, this is for the form only--we will conduct the interview and let him know it will take 10-14 days to complete the form in entirety.  Let me know if questions.  Thanks, CB

## 2020-08-16 NOTE — Telephone Encounter (Signed)
Pt  Wife informed of date using [employer] [phone].April Zimmerman Rumple, CMA

## 2020-08-30 ENCOUNTER — Ambulatory Visit (INDEPENDENT_AMBULATORY_CARE_PROVIDER_SITE_OTHER): Payer: Medicaid Other | Admitting: Family Medicine

## 2020-08-30 ENCOUNTER — Encounter: Payer: Self-pay | Admitting: Family Medicine

## 2020-08-30 ENCOUNTER — Other Ambulatory Visit: Payer: Self-pay

## 2020-08-30 DIAGNOSIS — H903 Sensorineural hearing loss, bilateral: Secondary | ICD-10-CM

## 2020-08-30 DIAGNOSIS — F4310 Post-traumatic stress disorder, unspecified: Secondary | ICD-10-CM | POA: Diagnosis not present
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
## 2020-08-30 NOTE — Patient Instructions (Signed)
It was wonderful to see you today.  Please bring ALL of your medications with you to every visit.   Today we talked about:  - Completing the N-648--I will call you to pick up in 1-2 weeks     Thank you for choosing [facility].   Please call [phone] with any questions about today's appointment.  Please be sure to schedule follow up at the front  desk before you leave today.   Terisa Starr, MD  Family Medicine

## 2020-09-01 ENCOUNTER — Encounter: Payer: Self-pay | Admitting: Family Medicine

## 2020-09-06 ENCOUNTER — Telehealth: Payer: Self-pay | Admitting: Family Medicine

## 2020-09-06 ENCOUNTER — Ambulatory Visit (HOSPITAL_COMMUNITY): Payer: Medicaid Other | Admitting: Psychiatry

## 2020-09-06 NOTE — Telephone Encounter (Signed)
Y195 form completed.  Reviewed, completed, and signed form.  Note routed to RN team inbasket and placed completed form in Clinic RN's office (wall pocket above desk). Nursing--please call patient and let him know it is completed and can pick up.  Westley Chandler, MD

## 2020-09-08 ENCOUNTER — Ambulatory Visit (INDEPENDENT_AMBULATORY_CARE_PROVIDER_SITE_OTHER): Payer: Medicaid Other | Admitting: Psychiatry

## 2020-09-08 ENCOUNTER — Encounter (HOSPITAL_COMMUNITY): Payer: Self-pay | Admitting: Psychiatry

## 2020-09-08 ENCOUNTER — Other Ambulatory Visit: Payer: Self-pay

## 2020-09-08 DIAGNOSIS — F4310 Post-traumatic stress disorder, unspecified: Secondary | ICD-10-CM | POA: Diagnosis not present

## 2020-09-08 DIAGNOSIS — F339 Major depressive disorder, recurrent, unspecified: Secondary | ICD-10-CM

## 2020-09-08 MED ORDER — TRAZODONE HCL 100 MG PO TABS: 100.0000 mg | ORAL_TABLET | Freq: Every day | ORAL | 2 refills | Status: DC

## 2020-09-08 MED ORDER — ESCITALOPRAM OXALATE 10 MG PO TABS: 10.0000 mg | ORAL_TABLET | Freq: Every day | ORAL | 2 refills | Status: DC

## 2020-09-08 MED ORDER — LORAZEPAM 0.5 MG PO TABS: 0.5000 mg | ORAL_TABLET | Freq: Two times a day (BID) | ORAL | 2 refills | Status: DC | PRN
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
## 2020-09-08 NOTE — Telephone Encounter (Signed)
Pt informed that the forms were at the front ready for pickup. Jone Baseman, CMA

## 2020-09-12 ENCOUNTER — Other Ambulatory Visit: Payer: Self-pay

## 2020-09-12 ENCOUNTER — Encounter (HOSPITAL_COMMUNITY): Payer: Self-pay | Admitting: Emergency Medicine

## 2020-09-12 ENCOUNTER — Ambulatory Visit (HOSPITAL_COMMUNITY)
Admission: EM | Admit: 2020-09-12 | Discharge: 2020-09-12 | Disposition: A | Discharge status: Home / Self Care | Payer: Medicaid Other | Attending: Emergency Medicine | Admitting: Emergency Medicine

## 2020-09-12 DIAGNOSIS — K029 Dental caries, unspecified: Secondary | ICD-10-CM | POA: Diagnosis not present

## 2020-09-12 DIAGNOSIS — K047 Periapical abscess without sinus: Secondary | ICD-10-CM

## 2020-09-12 MED ORDER — IBUPROFEN 800 MG PO TABS: 800.0000 mg | ORAL_TABLET | Freq: Three times a day (TID) | ORAL | 0 refills | Status: DC

## 2020-09-12 MED ORDER — KETOROLAC TROMETHAMINE 30 MG/ML IJ SOLN
INTRAMUSCULAR | Status: AC
  Filled 2020-09-12: qty 1

## 2020-09-12 MED ORDER — KETOROLAC TROMETHAMINE 30 MG/ML IJ SOLN
30.0000 mg | Freq: Once | INTRAMUSCULAR | Status: AC
  Administered 2020-09-12: 30 mg via INTRAMUSCULAR

## 2020-09-12 MED ORDER — AMOXICILLIN-POT CLAVULANATE 875-125 MG PO TABS: 1.0000 | ORAL_TABLET | Freq: Two times a day (BID) | ORAL | 0 refills | Status: DC

## 2020-09-12 NOTE — ED Triage Notes (Signed)
Pt presents with right side tooth pain xs 2 days.

## 2020-09-12 NOTE — ED Provider Notes (Signed)
[facility]    CSN: [id number] Arrival date & time: 09/12/20  1610      History   Chief Complaint Chief Complaint  Patient presents with  . Dental Pain    HPI Aaron Reyes is a 45 y.o. male.   Patient presents with right upper dental pain beginning one week ago. Took 400 mg ibuprofen and pain resolved. Came back 2 days ago worse, radiating to right and head, Interfering with sleep and eating. Able to tolerate liquids. Denies fever and chills,. Has not seen dentist but has appointment scheduled.   Interpreter used during entire exam   Past Medical History:  Diagnosis Date  . Bilateral sensorineural hearing loss 02/07/2017  . Cervicalgia 10/19/2016  . Closed fracture of body of sternum 06/09/2018  . Food insecurity 01/02/2019  . GERD (gastroesophageal reflux disease)   . Kidney stone   . Myocardial infarct ([place])   . Non-cardiac chest pain   . 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 PTSD (post-traumatic stress disorder)   . S/P cardiac catheterization 03/2015   cath in [place] and [place], no records,  [facility]ath at [facility] normal coronary arteries and normal LV function.  . Testicular mass 03/18/2015    Patient Active Problem List   Diagnosis Date Noted  . Medication refill 08/02/2020  . Encounter for review of form with patient 05/27/2020  . Major depressive disorder, recurrent episode with anxious distress ([place]) 03/16/2020  . Fatigue 03/08/2020  . Right shoulder pain 02/15/2020  . Allergic rhinitis 12/15/2019  . Recent unintentional weight loss over several months 10/25/2019  . Diabetes mellitus without complication ([place]) 21/30/8657  . Language barrier 01/02/2019  . Tobacco abuse 01/01/2018  . Chronic pain of both knees 02/21/2017  . GERD (gastroesophageal reflux disease) 09/04/2016  . Generalized anxiety disorder 01/26/2016  . Post traumatic stress disorder 01/26/2016    Past Surgical History:  Procedure Laterality Date  . CARDIAC CATHETERIZATION     . CARDIAC CATHETERIZATION     x 2  . CARDIAC CATHETERIZATION N/A 04/11/2015   Procedure: Left Heart Cath and Coronary Angiography;  Surgeon: Belva Crome, MD;  Location: [facility];  Service: Cardiovascular;  Laterality: N/A;  . HERNIA REPAIR         Home Medications    Prior to Admission medications   Medication Sig Start Date End Date Taking? Authorizing Provider  amoxicillin-clavulanate (AUGMENTIN) 875-125 MG tablet Take 1 tablet by mouth every 12 (twelve) hours. 09/12/20  Yes Shailen Thielen R, NP  ibuprofen (ADVIL) 800 MG tablet Take 1 tablet (800 mg total) by mouth 3 (three) times daily. 09/12/20  Yes Hans Eden, NP  aspirin 81 MG chewable tablet Chew 1 tablet (81 mg total) by mouth daily. 07/07/20   Benay Pike, MD  blood glucose meter kit and supplies KIT Dispense based on patient and insurance preference. Use up to four times daily as directed. (FOR ICD-9 250.00, 250.01). 09/24/19   Faustino Congress, NP  cetirizine (ZYRTEC) 10 MG tablet Take 1 tablet (10 mg total) by mouth daily. 12/11/19   Wilber Oliphant, MD  cyclobenzaprine (FLEXERIL) 5 MG tablet Take 1-2 tablets (5-10 mg total) by mouth 2 (two) times daily as needed for muscle spasms. 02/11/20   Wieters, Hallie C, PA-C  diclofenac Sodium (VOLTAREN) 1 % GEL Apply 2 g topically 4 (four) times daily. 07/07/20   Benay Pike, MD  escitalopram (LEXAPRO) 10 MG tablet Take 1 tablet (10 mg total) by mouth  daily. 09/08/20   [place] Crane, MD  LORazepam (ATIVAN) 0.5 MG tablet Take 1 tablet (0.5 mg total) by mouth 2 (two) times daily as needed for anxiety. 09/08/20   [place] Crane, MD  metFORMIN (GLUCOPHAGE-XR) 500 MG 24 hr tablet Take 2 tablets (1,000 mg total) by mouth daily with breakfast. 07/07/20   Benay Pike, MD  Multiple Vitamin (MULTIVITAMIN ADULT) TABS Take 1 tablet by mouth daily. 03/04/20   Wilber Oliphant, MD  naproxen (NAPROSYN) 500 MG tablet Take 1 tablet (500 mg total) by mouth 2 (two) times daily. 08/01/20    [name], DO  pantoprazole (PROTONIX) 40 MG tablet Take 1 tablet (40 mg total) by mouth 2 (two) times daily. 07/07/20   Benay Pike, MD  rosuvastatin (CRESTOR) 20 MG tablet Take 1 tablet (20 mg total) by mouth daily. 07/07/20   Benay Pike, MD  traZODone (DESYREL) 100 MG tablet Take 1 tablet (100 mg total) by mouth at bedtime. 09/08/20   [place] Crane, MD    Family History Family History  Problem Relation Age of Onset  . CAD Father   . Heart attack Father   . Hypertension Father   . Stroke Father   . Cancer Mother   . Diabetes Mother   . Hypertension Mother     Social History Social History   Tobacco Use  . Smoking status: Current Every Day Smoker    Packs/day: 0.50    Types: Cigarettes  . Smokeless tobacco: Never Used  Substance Use Topics  . Alcohol use: No  . Drug use: No     Allergies   Pork-derived products   Review of Systems Review of Systems  Constitutional: Negative.   HENT: Positive for dental problem. Negative for congestion, drooling, ear discharge, ear pain, facial swelling, hearing loss, mouth sores, nosebleeds, postnasal drip, rhinorrhea, sinus pressure, sinus pain, sneezing, sore throat, tinnitus, trouble swallowing and voice change.   Eyes: Negative.   Respiratory: Negative.   Cardiovascular: Negative.   Skin: Negative.      Physical Exam Triage Vital Signs ED Triage Vitals  Enc Vitals Group     BP 09/12/20 1653 128/84     Pulse Rate 09/12/20 1653 64     Resp 09/12/20 1653 18     Temp 09/12/20 1653 (!) 97.5 F (36.4 C)     Temp Source 09/12/20 1653 Oral     SpO2 09/12/20 1653 96 %     Weight --      Height --      Head Circumference --      Peak Flow --      Pain Score 09/12/20 1652 10     Pain Loc --      Pain Edu? --      Excl. in [place]? --    No data found.  Updated Vital Signs BP 128/84 (BP Location: Right Arm)   Pulse 64   Temp (!) 97.5 F (36.4 C) (Oral)   Resp 18   SpO2 96%   Visual Acuity Right Eye  Distance:   Left Eye Distance:   Bilateral Distance:    Right Eye Near:   Left Eye Near:    Bilateral Near:     Physical Exam Constitutional:      Appearance: Normal appearance. He is normal weight.  HENT:     Head: Normocephalic.     Nose: Nose normal.     Mouth/Throat:     Lips: Pink.  Mouth: Mucous membranes are moist.     Dentition: Dental caries present.      Comments: Dental carie noted on 3rd tooth from back beginning to form an abscess at the gum line,  Eyes:     Extraocular Movements: Extraocular movements intact.  Pulmonary:     Effort: Pulmonary effort is normal.  Musculoskeletal:     Cervical back: Normal range of motion and neck supple.  Neurological:     Mental Status: He is alert and oriented to person, place, and time. Mental status is at baseline.  Psychiatric:        Mood and Affect: Mood normal.        Behavior: Behavior normal.        Thought Content: Thought content normal.        Judgment: Judgment normal.      UC Treatments / Results  Labs (all labs ordered are listed, but only abnormal results are displayed) Labs Reviewed - No data to display  EKG   Radiology No results found.  Procedures Procedures (including critical care time)  Medications Ordered in UC Medications  ketorolac (TORADOL) 30 MG/ML injection 30 mg (has no administration in time range)    Initial Impression / Assessment and Plan / UC Course  I have reviewed the triage vital signs and the nursing notes.  Pertinent labs & imaging results that were available during my care of the patient were reviewed by me and considered in my medical decision making (see chart for details).  Dental carie and abscess   1. augmentin 875-125 bid for 7 days 2. Ibuprofen 800 mg tid as needed 3. Discussed importance of dentist appointment to treat and resolve issue, verbalized understanding  Final Clinical Impressions(s) / UC Diagnoses   Final diagnoses:  Dental caries  Dental  abscess     Discharge Instructions     Take augmentin once in the morning, once in the evening for 7 days  Can take ibuprofen with food three times a day to help with pain   Follow up with dentist for evaluation of teeth    ED Prescriptions    Medication Sig Dispense Auth. Provider   amoxicillin-clavulanate (AUGMENTIN) 875-125 MG tablet Take 1 tablet by mouth every 12 (twelve) hours. 14 tablet Emiko Osorto R, NP   ibuprofen (ADVIL) 800 MG tablet Take 1 tablet (800 mg total) by mouth 3 (three) times daily. 50 tablet Hans Eden, NP     PDMP not reviewed this encounter.   Hans Eden, NP 09/12/20 1729

## 2020-09-12 NOTE — Discharge Instructions (Signed)
Take augmentin once in the morning, once in the evening for 7 days  Can take ibuprofen with food three times a day to help with pain   Follow up with dentist for evaluation of teeth

## 2020-09-14 ENCOUNTER — Telehealth (HOSPITAL_COMMUNITY): Payer: Self-pay | Admitting: *Deleted

## 2020-09-14 ENCOUNTER — Encounter (HOSPITAL_COMMUNITY): Payer: Self-pay | Admitting: Psychiatry

## 2020-09-14 ENCOUNTER — Telehealth: Payer: Self-pay

## 2020-09-14 NOTE — Telephone Encounter (Signed)
[employer] form dropped off for at front desk for completion.  Verified that patient section of form has been completed.  Last DOS/WCC with PCP was 08/30/2020.  Placed form in team folder to be completed by clinical staff.  [employer]

## 2020-09-14 NOTE — Telephone Encounter (Signed)
Dr Evelene Croon composed a letter for patient re his inability to take the citizenship test. He had come in earlier today looking for it but it wasn't prepared yet. Called him to tell him it was at the front desk for him whenever he could come back and pick it up. [place] Spaniel this to a male in the home that speaks [place]. Said he would be back today for the letter.

## 2020-09-14 NOTE — Progress Notes (Signed)
Letter issued as per pt request.

## 2020-09-15 NOTE — Telephone Encounter (Signed)
Clinical info completed on transportatiion form.  Place form in Dr Elmyra Ricks box for completion.  Sunday Spillers, CMA

## 2020-09-19 NOTE — Telephone Encounter (Signed)
Patient has appt on 09/21/20 with me. I will be able to complete these forms on that day. I will need to perform pertinent physical exam components and pertinent history questions.

## 2020-09-20 NOTE — Progress Notes (Addendum)
   SUBJECTIVE:  CHIEF COMPLAINT / HPI:   DMV forms  Patient brought in [facility] forms last week. He would like me to fill these out. We went over the remainder of the forms today. He will need to see ophthalmology to fill out the last part of the form. (additionally, will need a ophthalmology exam for his diabetes). I will send in the completed physician portion via fax. Eye appointment is scheduled for 11/07/2020 at Dr. Dione Booze. Appointment is at 3:00pm.   DM Type II  Current medication regimen: Metformin 1,000 mg    He reports good compliance with treatment. He is not having side effects. Denies hypoglycemic events, fatigue, nausea/vomiting, paraesthesias, polyuria/polydipsia, visual changes. Diabetic complications include: neuropathy   Bilateral foot/lower extremity pain Complaining of b/l lower extremity pain. Pain is distal in legs and feet, burning for several months. He also describes it as stabbing with a knife at times. He reports it keeps him up at night and that it is unbearable.   PERTINENT  PMH / PSH: GAD, MDD, PTSD, language barrier, tobacco abuse   OBJECTIVE:  BP 110/68   Pulse 76   Ht 5\' 5"  (1.651 m)   Wt 140 lb 9.6 oz (63.8 kg)   SpO2 98%   BMI 23.40 kg/m   General: well appearing male, NAD  Lower extremities: no rash, erythema or swelling appreciated. Mild TTP throughout distal lower extremities. Full ROM. Sensation intact.   Diabetic foot exam: Upon inspection there are no lesions, rash, macerated areas between toes, bony deformities, bunions. DPs are 2+ bilaterally.  There is no loss of sensation to monofilament touch or pinprick sensation bilaterally at sites at high risk for ulceration. Patient has a normal gait and joint mobility.  Patient has preserved great toe proprioception bilaterally.  ASSESSMENT/PLAN:  Diabetes mellitus without complication (HCC) Will obtain A1C at next visit. Weight trend: stable Last urine microalbumin: 07/07/20, 30    Most Recent Eye Exam:  last year, scheduled patient for ophtalmology visit today (needs for DMV forms)   Last Diabetic Foot Check:  Today Overall, no significant medication side effects noted and well controlled. -Medications: Continue metformin .   -Encouraged compliance with medications, and adherence to diet and exercise recommendations. -Follow-up in q3 months and bring medications to next appointment.  Neuropathy Given symptoms, I suspect neuropathy 2/2 DM or chronic pain associated with his PTSD/MDD/GAD. I will start Gabapentin 300mg  with directions for titration. Thoroughly discussed the titration via interpretor today, as it can be confusing. Follow up in one month   Patient takes NSAID (non-steroid anti-inflammatory drug) Also patient notes that the 800 mg ibuprofen #50 that he was prescribed on 3/28 is gone and that he was taking them multiple times a day, sometimes an hour a part for pain. I will check a BMP today. Counseled patient on appropriate ibuprofen use.    Hyperlipidemia associated with type 2 diabetes mellitus (HCC) Obtaining lipid panel today. Patient compliant with crestor 20 mg daily.      Hep C obtained for screening   , MD [facility]

## 2020-09-21 ENCOUNTER — Other Ambulatory Visit: Payer: Self-pay

## 2020-09-21 ENCOUNTER — Encounter: Payer: Self-pay | Admitting: Family Medicine

## 2020-09-21 ENCOUNTER — Ambulatory Visit (INDEPENDENT_AMBULATORY_CARE_PROVIDER_SITE_OTHER): Payer: Medicaid Other | Admitting: Family Medicine

## 2020-09-21 VITALS — BP 110/68 | HR 76 | Ht 65.0 in | Wt 140.6 lb

## 2020-09-21 DIAGNOSIS — Z791 Long term (current) use of non-steroidal anti-inflammatories (NSAID): Secondary | ICD-10-CM

## 2020-09-21 DIAGNOSIS — E0842 Diabetes mellitus due to underlying condition with diabetic polyneuropathy: Secondary | ICD-10-CM | POA: Diagnosis present

## 2020-09-21 DIAGNOSIS — Z1159 Encounter for screening for other viral diseases: Secondary | ICD-10-CM

## 2020-09-21 DIAGNOSIS — E119 Type 2 diabetes mellitus without complications: Secondary | ICD-10-CM

## 2020-09-21 DIAGNOSIS — G629 Polyneuropathy, unspecified: Secondary | ICD-10-CM | POA: Diagnosis not present

## 2020-09-21 DIAGNOSIS — E785 Hyperlipidemia, unspecified: Secondary | ICD-10-CM

## 2020-09-21 DIAGNOSIS — E1169 Type 2 diabetes mellitus with other specified complication: Secondary | ICD-10-CM | POA: Diagnosis not present

## 2020-09-21 MED ORDER — GABAPENTIN 300 MG PO CAPS: 300.0000 mg | ORAL_CAPSULE | Freq: Three times a day (TID) | ORAL | 0 refills | Status: DC

## 2020-09-21 NOTE — Patient Instructions (Addendum)
Diabetes Your diabetes is well controlled.  . We will not make any medication changes today.  . Continue taking your medications, exercising and adhering to a diabetic diet  . Follow up in 3 months or sooner if you are having any medication issues    Neuropathy  We will start a medication called gabapentin.  Start with 300 mg gabapentin before bedtime  In three days, your can increase to 600 mg before bedtime.  In one week, if you still have pain, you can increase to 900 mg at bedtime.  Do not take more of this medication than prescribed.            .      .               3                 .   300              600   .             900    .         . da' alsukari yatimu altahakum fi marad alsukari ladayk bishakl jid. lan nujri 'ayu taghyirat dawayiyat alyawma. astamira fi tanawul 'adwiatik wamumarasat alriyadat walialtizam binizam ghidhayiyin limardaa alsukari tabie fi ghudun 3 'ashhur 'aw 'aqalu 'iidha Laurin Coder 'aya mashakil fi al'adwia alaietilal aleasabiu sanabda bidawa' yusamia jababintin. abda bi 300 majam jababinatayn qabl alnawm fi ghudun thalathat 'ayaam , [place] 'an tazid 'iilaa 600 mujam qabl alnuwm. fi 'usbue wahid , 'iidhan astamara al'alam , [place] ziadat aljureat 'iilaa 900 majam fi waqt alnuwm. la takhudh almazid min hadha aldawa' mimaa hu mwswf.

## 2020-09-22 ENCOUNTER — Encounter: Payer: Self-pay | Admitting: Family Medicine

## 2020-09-22 DIAGNOSIS — G629 Polyneuropathy, unspecified: Secondary | ICD-10-CM | POA: Insufficient documentation

## 2020-09-22 DIAGNOSIS — Z791 Long term (current) use of non-steroidal anti-inflammatories (NSAID): Secondary | ICD-10-CM | POA: Insufficient documentation

## 2020-09-22 DIAGNOSIS — E1169 Type 2 diabetes mellitus with other specified complication: Secondary | ICD-10-CM | POA: Insufficient documentation

## 2020-09-22 DIAGNOSIS — E785 Hyperlipidemia, unspecified: Secondary | ICD-10-CM | POA: Insufficient documentation

## 2020-09-22 LAB — BASIC METABOLIC PANEL
BUN/Creatinine Ratio: 30 — ABNORMAL HIGH (ref 9–20)
BUN: 24 mg/dL (ref 6–24)
CO2: 20 mmol/L (ref 20–29)
Calcium: 9.2 mg/dL (ref 8.7–10.2)
Chloride: 103 mmol/L (ref 96–106)
Creatinine, Ser: 0.81 mg/dL (ref 0.76–1.27)
Glucose: 109 mg/dL — ABNORMAL HIGH (ref 65–99)
Potassium: 4.5 mmol/L (ref 3.5–5.2)
Sodium: 139 mmol/L (ref 134–144)
eGFR: 113 mL/min/{1.73_m2} (ref >59)

## 2020-09-22 LAB — HEPATITIS C ANTIBODY: Hep C Virus Ab: <0.1 s/co ratio (ref 0.0–0.9)

## 2020-09-22 LAB — LIPID PANEL
Chol/HDL Ratio: 3.8 ratio (ref 0.0–5.0)
Cholesterol, Total: 137 mg/dL (ref 100–199)
HDL: 36 mg/dL — ABNORMAL LOW (ref >39)
LDL Chol Calc (NIH): 74 mg/dL (ref 0–99)
Triglycerides: 153 mg/dL — ABNORMAL HIGH (ref 0–149)
VLDL Cholesterol Cal: 27 mg/dL (ref 5–40)

## 2020-09-22 NOTE — Assessment & Plan Note (Signed)
Will obtain A1C at next visit. Weight trend: stable Last urine microalbumin: 07/07/20, 30    Most Recent Eye Exam: last year, scheduled patient for ophtalmology visit today (needs for DMV forms)   Last Diabetic Foot Check:  Today Overall, no significant medication side effects noted and well controlled. -Medications: Continue metformin .   -Encouraged compliance with medications, and adherence to diet and exercise recommendations. -Follow-up in q3 months and bring medications to next appointment.

## 2020-09-22 NOTE — Assessment & Plan Note (Addendum)
Given symptoms, I suspect neuropathy 2/2 DM or chronic pain associated with his PTSD/MDD/GAD. I will start Gabapentin 300mg  with directions for titration. Thoroughly discussed the titration via interpretor today, as it can be confusing. Follow up in one month

## 2020-09-22 NOTE — Assessment & Plan Note (Signed)
Also patient notes that the 800 mg ibuprofen #50 that he was prescribed on 3/28 is gone and that he was taking them multiple times a day, sometimes an hour a part for pain. I will check a BMP today. Counseled patient on appropriate ibuprofen use.

## 2020-09-22 NOTE — Assessment & Plan Note (Signed)
Obtaining lipid panel today. Patient compliant with crestor 20 mg daily.

## 2020-10-13 NOTE — Progress Notes (Deleted)
    SUBJECTIVE:   CHIEF COMPLAINT / HPI:   DM  A1C   PERTINENT  PMH / PSH: ***  OBJECTIVE:   There were no vitals taken for this visit.  ***  ASSESSMENT/PLAN:   No problem-specific Assessment & Plan notes found for this encounter.     Melene Plan, MD [facility]   {    This will disappear when note is signed, click to select method of visit    :1}

## 2020-10-14 ENCOUNTER — Ambulatory Visit: Payer: Medicaid Other | Admitting: Family Medicine

## 2020-11-15 ENCOUNTER — Emergency Department (HOSPITAL_COMMUNITY): Payer: Medicaid Other

## 2020-11-15 ENCOUNTER — Observation Stay (HOSPITAL_COMMUNITY)
Admission: EM | Admit: 2020-11-15 | Discharge: 2020-11-16 | Disposition: A | Discharge status: Home / Self Care | Payer: Medicaid Other | Attending: Family Medicine | Admitting: Family Medicine

## 2020-11-15 ENCOUNTER — Other Ambulatory Visit: Payer: Self-pay

## 2020-11-15 ENCOUNTER — Encounter (HOSPITAL_COMMUNITY): Payer: Self-pay | Admitting: Family Medicine

## 2020-11-15 DIAGNOSIS — Z7984 Long term (current) use of oral hypoglycemic drugs: Secondary | ICD-10-CM | POA: Diagnosis not present

## 2020-11-15 DIAGNOSIS — Z7982 Long term (current) use of aspirin: Secondary | ICD-10-CM | POA: Insufficient documentation

## 2020-11-15 DIAGNOSIS — R42 Dizziness and giddiness: Secondary | ICD-10-CM | POA: Diagnosis not present

## 2020-11-15 DIAGNOSIS — R519 Headache, unspecified: Secondary | ICD-10-CM

## 2020-11-15 DIAGNOSIS — F17210 Nicotine dependence, cigarettes, uncomplicated: Secondary | ICD-10-CM | POA: Diagnosis not present

## 2020-11-15 DIAGNOSIS — Z20822 Contact with and (suspected) exposure to covid-19: Secondary | ICD-10-CM | POA: Diagnosis not present

## 2020-11-15 DIAGNOSIS — R079 Chest pain, unspecified: Secondary | ICD-10-CM | POA: Diagnosis not present

## 2020-11-15 DIAGNOSIS — E119 Type 2 diabetes mellitus without complications: Secondary | ICD-10-CM | POA: Insufficient documentation

## 2020-11-15 DIAGNOSIS — R531 Weakness: Secondary | ICD-10-CM

## 2020-11-15 LAB — CBC
HCT: 45.4 % (ref 39.0–52.0)
Hemoglobin: 15.4 g/dL (ref 13.0–17.0)
MCH: 29.2 pg (ref 26.0–34.0)
MCHC: 33.9 g/dL (ref 30.0–36.0)
MCV: 86 fL (ref 80.0–100.0)
Platelets: 235 10*3/uL (ref 150–400)
RBC: 5.28 MIL/uL (ref 4.22–5.81)
RDW: 12.3 % (ref 11.5–15.5)
WBC: 9.2 10*3/uL (ref 4.0–10.5)
nRBC: 0 % (ref 0.0–0.2)

## 2020-11-15 LAB — RESP PANEL BY RT-PCR (FLU A&B, COVID) ARPGX2
Influenza A by PCR: NEGATIVE
Influenza B by PCR: NEGATIVE
SARS Coronavirus 2 by RT PCR: NEGATIVE

## 2020-11-15 LAB — BASIC METABOLIC PANEL
Anion gap: 10 (ref 5–15)
BUN: 21 mg/dL — ABNORMAL HIGH (ref 6–20)
CO2: 25 mmol/L (ref 22–32)
Calcium: 8.9 mg/dL (ref 8.9–10.3)
Chloride: 103 mmol/L (ref 98–111)
Creatinine, Ser: 0.99 mg/dL (ref 0.61–1.24)
GFR, Estimated: >60 mL/min (ref >60)
Glucose, Bld: 125 mg/dL — ABNORMAL HIGH (ref 70–99)
Potassium: 4 mmol/L (ref 3.5–5.1)
Sodium: 138 mmol/L (ref 135–145)

## 2020-11-15 LAB — CBG MONITORING, ED: Glucose-Capillary: 124 mg/dL — ABNORMAL HIGH (ref 70–99)

## 2020-11-15 LAB — HIV ANTIBODY (ROUTINE TESTING W REFLEX): HIV Screen 4th Generation wRfx: NONREACTIVE

## 2020-11-15 LAB — TROPONIN I (HIGH SENSITIVITY)
Troponin I (High Sensitivity): 9 ng/L (ref <18)
Troponin I (High Sensitivity): 8 ng/L (ref <18)

## 2020-11-15 IMAGING — MR MR HEAD W/O CM
9 of 10 series · 37 of 48 positions shown · non-contrast
Comparison: CT head [DATE].

CLINICAL DATA: Neuro deficit.  Dizziness, left arm heaviness.

EXAM:
MRI HEAD WITHOUT CONTRAST
TECHNIQUE: Multiplanar, multiecho pulse sequences of the brain and surrounding
structures were obtained without intravenous contrast.

[Series 3: DWI · axial · 3.0mm · 1.09mm/px · z∈[-51,+81]mm · 9 of 90 slices shown (1 of 4)]
[im 1/90]
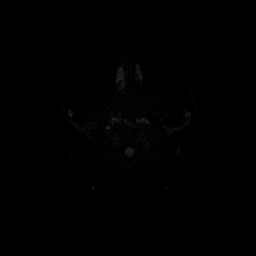
[im 12/90]
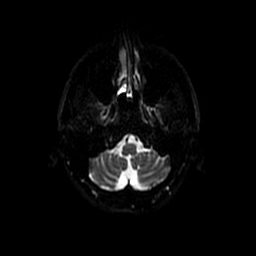
[im 23/90]
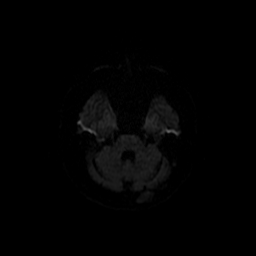
[im 34/90]
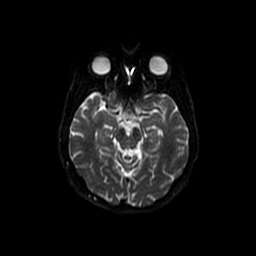
[im 45/90]
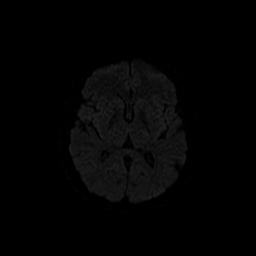
[im 56/90]
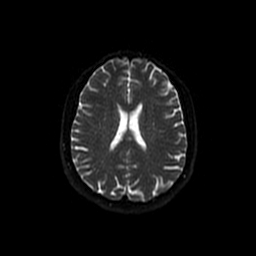
[im 67/90]
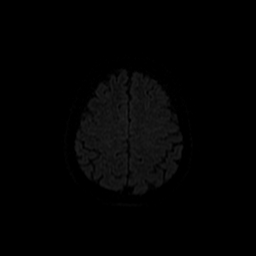
[im 78/90]
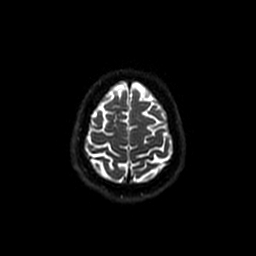
[im 90/90]
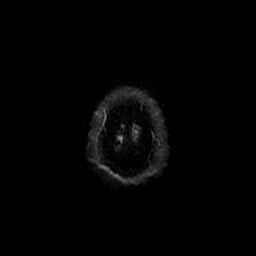

[Series 4: DWI · coronal · 5.0mm · 1.09mm/px · 7 of 66 slices shown (2 of 4)]
[im 1/66]
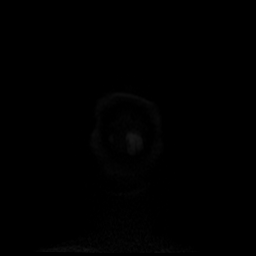
[im 11/66]
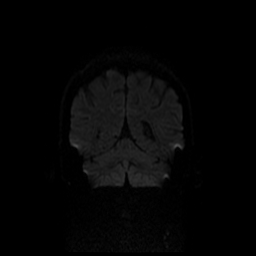
[im 22/66]
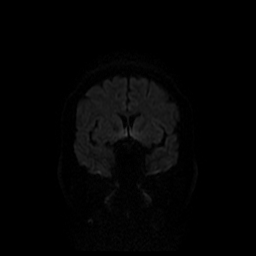
[im 33/66]
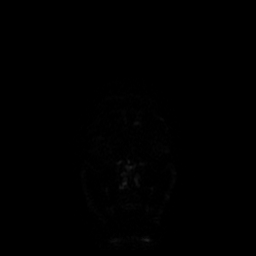
[im 44/66]
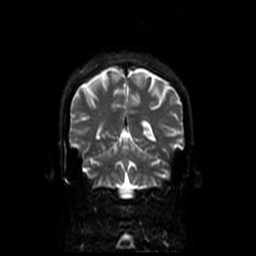
[im 55/66]
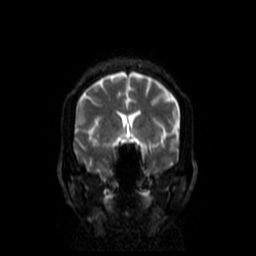
[im 66/66]
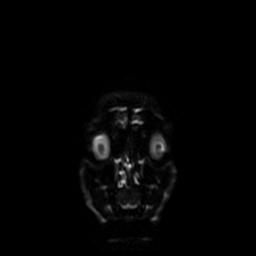

[Series 5: T1 · sagittal · 5.0mm · 0.47mm/px · 2 of 23 slices shown (1 of 2)]
[im 1/23]
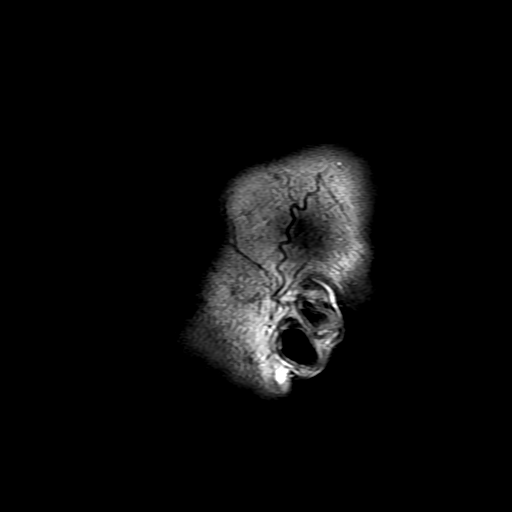
[im 23/23]
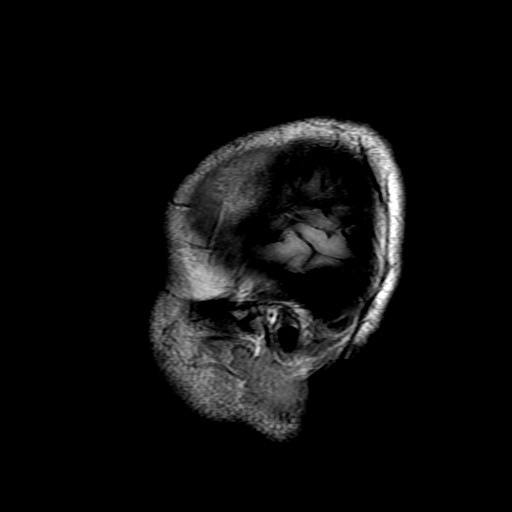

[Series 6: T2 · axial · 5.0mm · 0.43mm/px · z∈[-54,+84]mm · 3 of 24 slices shown (1 of 2)]
[im 1/24]
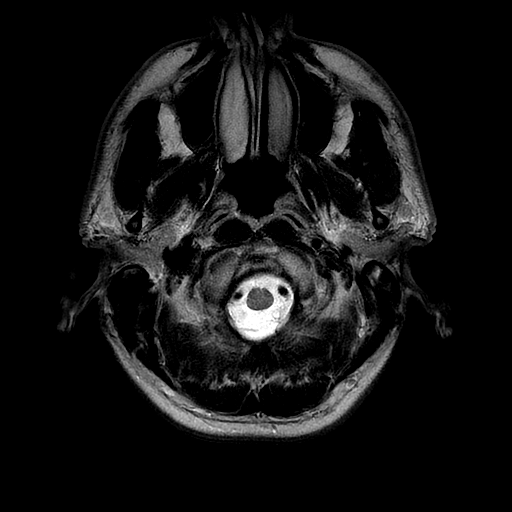
[im 12/24]
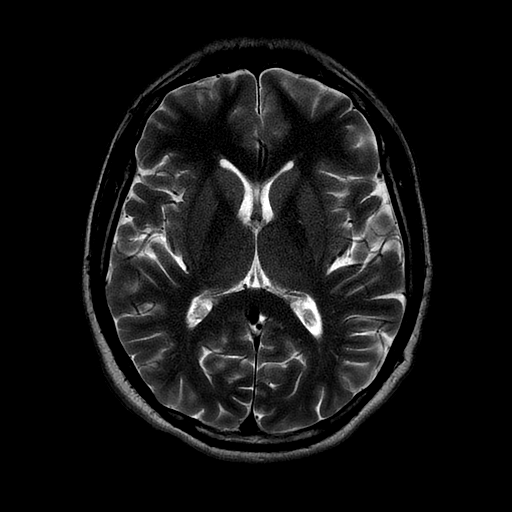
[im 24/24]
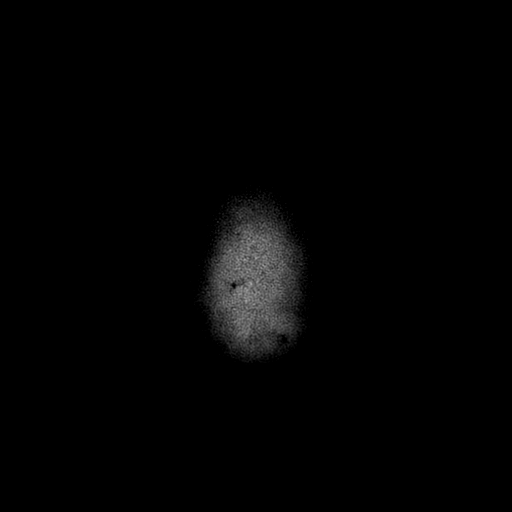

[Series 8: FLAIR · axial · 5.0mm · 0.43mm/px · z∈[-54,+84]mm · 3 of 24 slices shown]
[im 1/24]
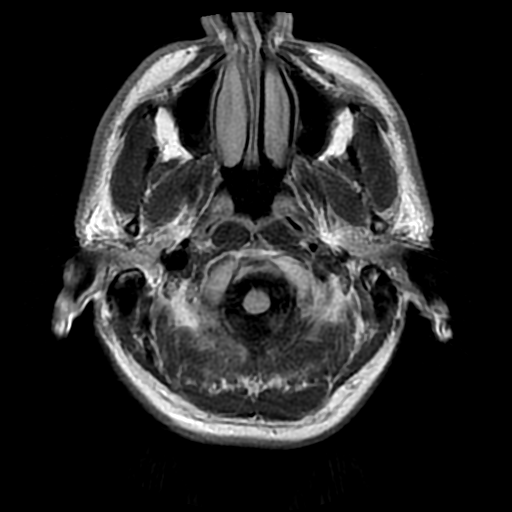
[im 12/24]
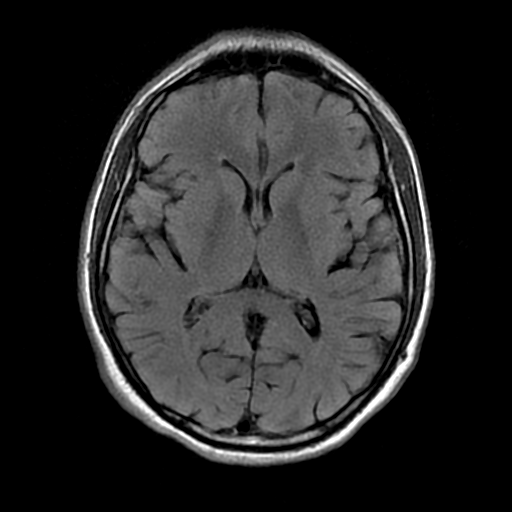
[im 24/24]
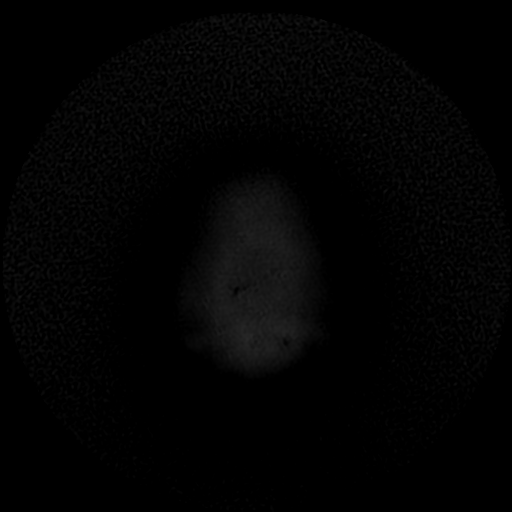

[Series 10: T1 · axial · 3.0mm · 0.47mm/px · 1 of 92 slices shown (2 of 2)]
[im 1/92]
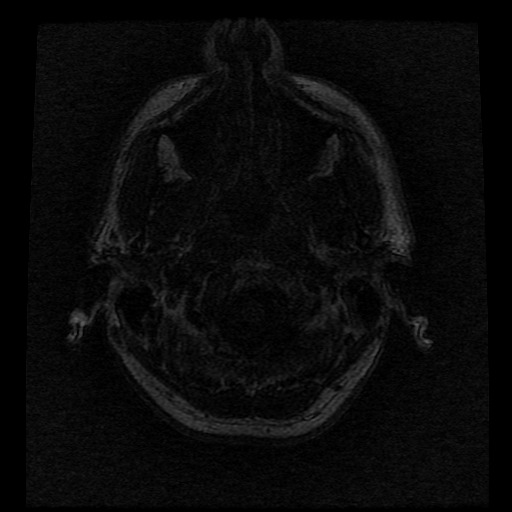

[Series 11: T2 · coronal · 5.0mm · 0.39mm/px · 3 of 25 slices shown (2 of 2)]
[im 1/25]
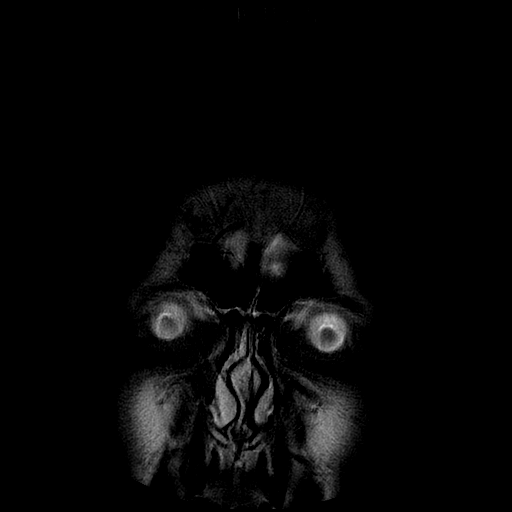
[im 13/25]
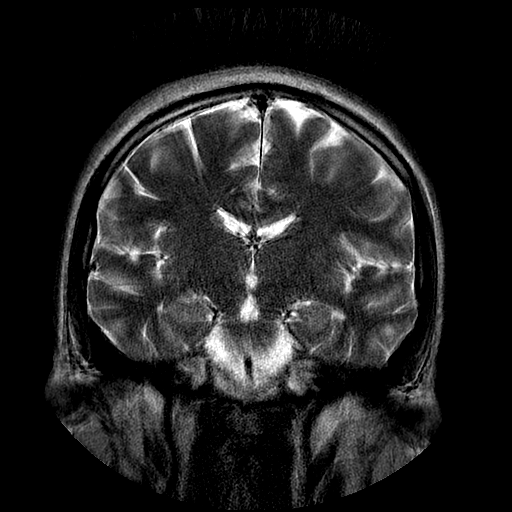
[im 25/25]
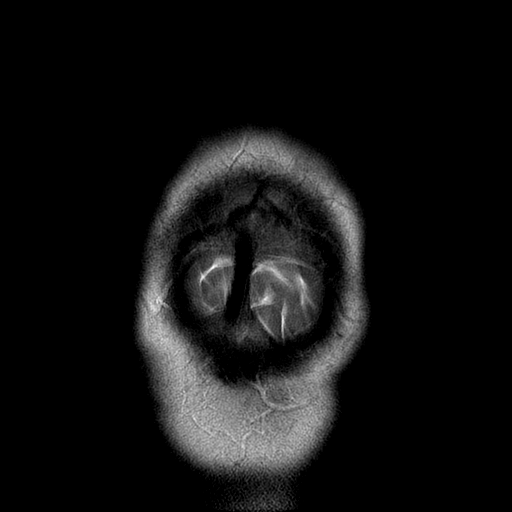

[Series 300: DWI · axial · 3.0mm · 1.09mm/px · z∈[-51,+81]mm · 5 of 45 slices shown (3 of 4)]
[im 1/45]
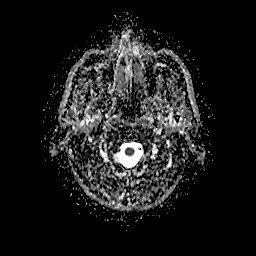
[im 12/45]
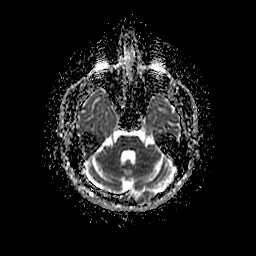
[im 23/45]
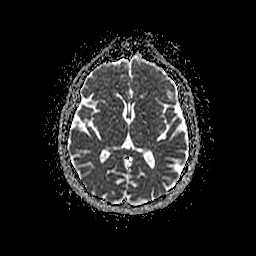
[im 34/45]
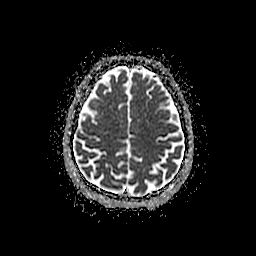
[im 45/45]
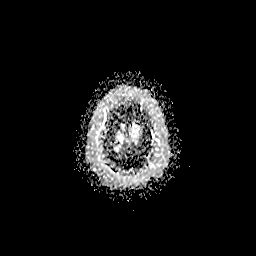

[Series 400: DWI · coronal · 5.0mm · 1.09mm/px · 4 of 33 slices shown (4 of 4)]
[im 1/33]
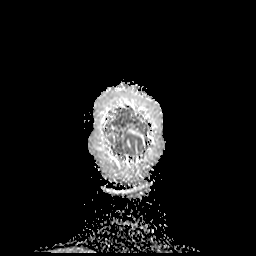
[im 11/33]
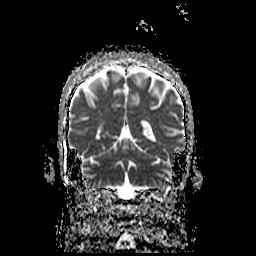
[im 22/33]
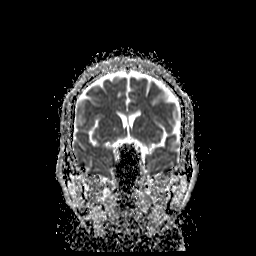
[im 33/33]
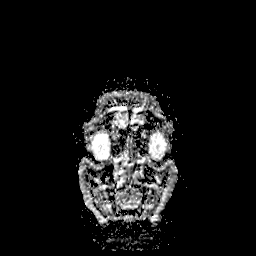

[37 of 48 positions shown; findings below may reference images not displayed]

FINDINGS: Brain: No acute infarction, hemorrhage, hydrocephalus, extra-axial
collection or mass lesion. Dilated perivascular spaces in the
inferior basal ganglia bilaterally.

Vascular: Major arterial flow voids are maintained at the skull
base.

Skull and upper cervical spine: Normal marrow signal.

Sinuses/Orbits: Clear visualized sinuses.  Unremarkable orbits.

Other: No mastoid effusions.
IMPRESSION: No evidence of acute intracranial abnormality.  No acute infarct.

## 2020-11-15 IMAGING — DX DG CHEST 1V PORT
1 series · 1 of 1 positions shown · non-contrast
Comparison: [DATE]

CLINICAL DATA: Chest pain.

EXAM:
PORTABLE CHEST 1 VIEW

[chest]
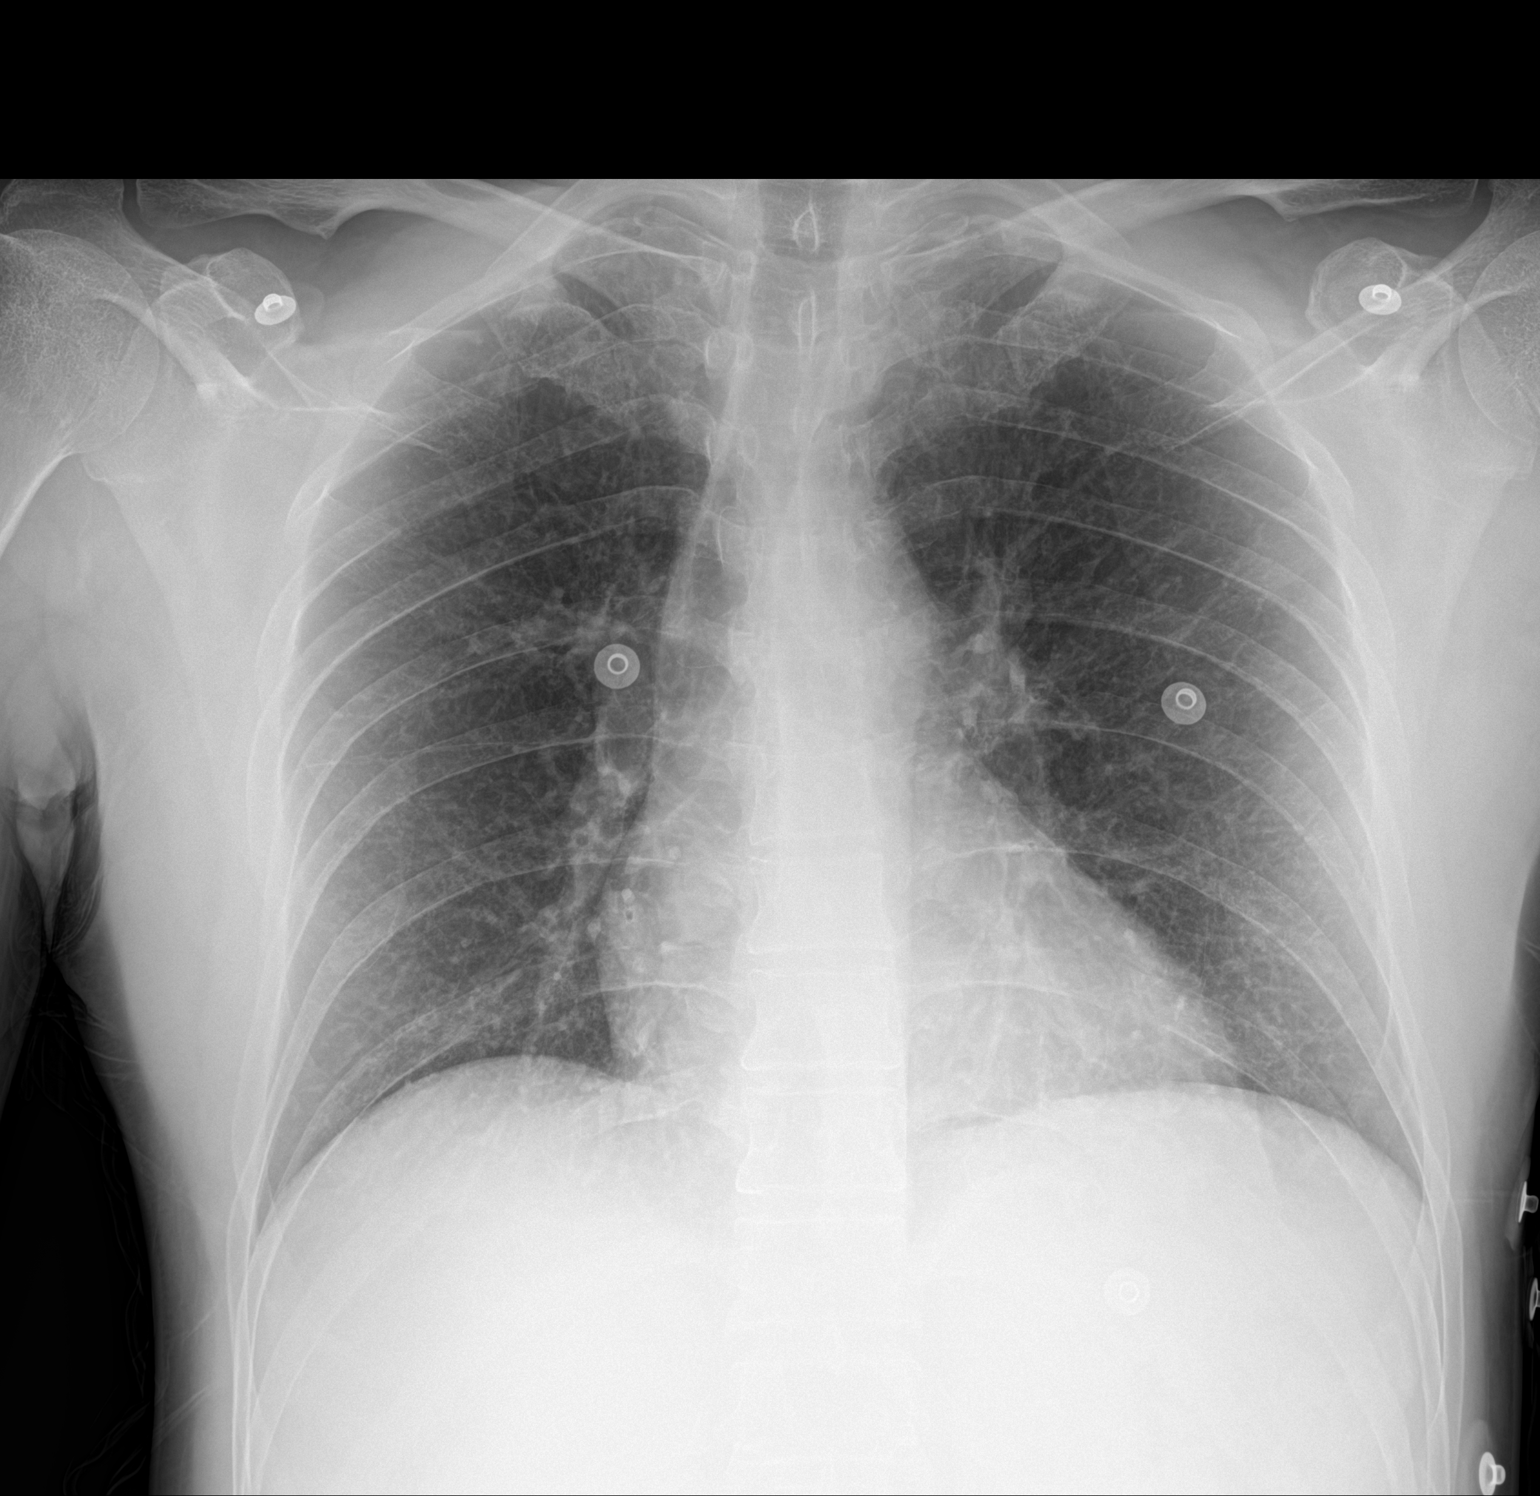

[1 of 1 positions shown; findings below may reference images not displayed]

FINDINGS: The heart size and mediastinal contours are within normal limits.
Both lungs are clear. The visualized skeletal structures are
unremarkable.
IMPRESSION: No active disease.

## 2020-11-15 MED ORDER — ROSUVASTATIN CALCIUM 20 MG PO TABS
20.0000 mg | ORAL_TABLET | Freq: Every day | ORAL | Status: DC
  Administered 2020-11-15 – 2020-11-16 (×2): 20 mg via ORAL
  Filled 2020-11-15 (×2): qty 1

## 2020-11-15 MED ORDER — SODIUM CHLORIDE 0.9 % IV BOLUS
500.0000 mL | Freq: Once | INTRAVENOUS | Status: AC
  Administered 2020-11-15: 500 mL via INTRAVENOUS

## 2020-11-15 MED ORDER — ESCITALOPRAM OXALATE 10 MG PO TABS
10.0000 mg | ORAL_TABLET | Freq: Every day | ORAL | Status: DC
  Administered 2020-11-15 – 2020-11-16 (×2): 10 mg via ORAL
  Filled 2020-11-15 (×2): qty 1

## 2020-11-15 MED ORDER — METFORMIN HCL ER 500 MG PO TB24
1000.0000 mg | ORAL_TABLET | Freq: Every day | ORAL | Status: DC
  Administered 2020-11-16: 1000 mg via ORAL
  Filled 2020-11-15: qty 2

## 2020-11-15 MED ORDER — PANTOPRAZOLE SODIUM 40 MG PO TBEC
40.0000 mg | DELAYED_RELEASE_TABLET | Freq: Two times a day (BID) | ORAL | Status: DC
  Administered 2020-11-15 – 2020-11-16 (×2): 40 mg via ORAL
  Filled 2020-11-15 (×2): qty 1

## 2020-11-15 MED ORDER — ASPIRIN 81 MG PO CHEW
81.0000 mg | CHEWABLE_TABLET | Freq: Every day | ORAL | Status: DC
  Administered 2020-11-15 – 2020-11-16 (×2): 81 mg via ORAL
  Filled 2020-11-15 (×2): qty 1

## 2020-11-15 MED ORDER — MECLIZINE HCL 25 MG PO TABS
25.0000 mg | ORAL_TABLET | Freq: Once | ORAL | Status: AC
  Administered 2020-11-15: 25 mg via ORAL
  Filled 2020-11-15: qty 1

## 2020-11-15 MED ORDER — DIAZEPAM 5 MG/ML IJ SOLN
2.5000 mg | Freq: Once | INTRAMUSCULAR | Status: AC
  Administered 2020-11-15: 2.5 mg via INTRAVENOUS
  Filled 2020-11-15: qty 2

## 2020-11-15 MED ORDER — LORAZEPAM 2 MG/ML IJ SOLN
0.5000 mg | Freq: Once | INTRAMUSCULAR | Status: AC
  Administered 2020-11-15: 0.5 mg via INTRAVENOUS
  Filled 2020-11-15: qty 1

## 2020-11-15 MED ORDER — ACETAMINOPHEN 650 MG RE SUPP: 650.0000 mg | Freq: Four times a day (QID) | RECTAL | Status: DC | PRN

## 2020-11-15 MED ORDER — ACETAMINOPHEN 325 MG PO TABS: 650.0000 mg | ORAL_TABLET | Freq: Four times a day (QID) | ORAL | Status: DC | PRN

## 2020-11-15 NOTE — ED Provider Notes (Signed)
[facility] EMERGENCY DEPARTMENT Provider Note   CSN: [id number] Arrival date & time: 11/15/20  0445     History Chief Complaint  Patient presents with  . Chest Pain    Aaron Reyes is a 45 y.o. male.  45 year old male with a history of PTSD, anxiety, depression, chronic chest pain, esophageal reflux presents to the emergency department for complaints of chest pain.  Most of the history is obtained from son at bedside as patient is feeling too symptomatic to participate in his encounter.  Son reports that pain began around 2300 tonight and woke the patient from sleep.  He has had similar chest pain in the past.  Feels as though his tongue and body are heavy with his symptoms and it is difficult for him to move.  He took his diabetes medication prior to arrival without symptomatic improvement.  Is chronically under increased financial and situational stress.  No recent fevers.  Prior cardiac catheterization x3 without headache of coronary artery disease  The history is provided by the patient. No language interpreter was used.  Chest Pain      Past Medical History:  Diagnosis Date  . Bilateral sensorineural hearing loss 02/07/2017  . Cervicalgia 10/19/2016  . Closed fracture of body of sternum 06/09/2018  . Food insecurity 01/02/2019  . GERD (gastroesophageal reflux disease)   . Kidney stone   . Myocardial infarct (HCC)   . Non-cardiac chest pain   . 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 PTSD (post-traumatic stress disorder)   . S/P cardiac catheterization 03/2015   cath in [place] and [place], no records,  cath at [facility] normal coronary arteries and normal LV function.  . Testicular mass 03/18/2015    Patient Active Problem List   Diagnosis Date Noted  . Neuropathy 09/22/2020  . Patient takes NSAID (non-steroid anti-inflammatory drug) 09/22/2020  . Hyperlipidemia associated with type 2 diabetes mellitus (HCC) 09/22/2020  . Medication refill  08/02/2020  . Encounter for review of form with patient 05/27/2020  . Major depressive disorder, recurrent episode with anxious distress (HCC) 03/16/2020  . Fatigue 03/08/2020  . Right shoulder pain 02/15/2020  . Allergic rhinitis 12/15/2019  . Recent unintentional weight loss over several months 10/25/2019  . Diabetes mellitus without complication (HCC) 07/13/2019  . Language barrier 01/02/2019  . Tobacco abuse 01/01/2018  . Chronic pain of both knees 02/21/2017  . GERD (gastroesophageal reflux disease) 09/04/2016  . Generalized anxiety disorder 01/26/2016  . Post traumatic stress disorder 01/26/2016    Past Surgical History:  Procedure Laterality Date  . CARDIAC CATHETERIZATION    . CARDIAC CATHETERIZATION     x 2  . CARDIAC CATHETERIZATION N/A 04/11/2015   Procedure: Left Heart Cath and Coronary Angiography;  Surgeon: Lyn Records, MD;  Location: [facility] INVASIVE CV LAB;  Service: Cardiovascular;  Laterality: N/A;  . HERNIA REPAIR         Family History  Problem Relation Age of Onset  . CAD Father   . Heart attack Father   . Hypertension Father   . Stroke Father   . Cancer Mother   . Diabetes Mother   . Hypertension Mother     Social History   Tobacco Use  . Smoking status: Current Every Day Smoker    Packs/day: 0.50    Types: Cigarettes  . Smokeless tobacco: Never Used  Substance Use Topics  . Alcohol use: No  . Drug use: No    Home Medications Prior to Admission  medications   Medication Sig Start Date End Date Taking? Authorizing Provider  acetaminophen (TYLENOL) 500 MG tablet Take 1,000 mg by mouth every 6 (six) hours as needed for moderate pain or headache.   Yes [provider]  Acetaminophen-Caffeine (EXCEDRIN TENSION HEADACHE PO) Take 1 tablet by mouth daily as needed (headache).   Yes [provider]  aspirin 81 MG chewable tablet Chew 1 tablet (81 mg total) by mouth daily. 07/07/20  Yes Sandre Kitty, MD  escitalopram (LEXAPRO) 10 MG  tablet Take 1 tablet (10 mg total) by mouth daily. 09/08/20  Yes Zena Amos, MD  gabapentin (NEURONTIN) 300 MG capsule Take 1 capsule (300 mg total) by mouth 3 (three) times daily. Take 300 mg at bedtime for the first three days. If you continue to have pain, increase to 600 mg. After three days, you can increase to 900 mg at bedtime. Patient taking differently: Take 900 mg by mouth at bedtime. 09/21/20  Yes Melene Plan, MD  LORazepam (ATIVAN) 0.5 MG tablet Take 1 tablet (0.5 mg total) by mouth 2 (two) times daily as needed for anxiety. 09/08/20  Yes Zena Amos, MD  metFORMIN (GLUCOPHAGE-XR) 500 MG 24 hr tablet Take 2 tablets (1,000 mg total) by mouth daily with breakfast. 07/07/20  Yes Sandre Kitty, MD  Multiple Vitamin (MULTIVITAMIN ADULT) TABS Take 1 tablet by mouth daily. 03/04/20  Yes Melene Plan, MD  pantoprazole (PROTONIX) 40 MG tablet Take 1 tablet (40 mg total) by mouth 2 (two) times daily. 07/07/20  Yes Sandre Kitty, MD  rosuvastatin (CRESTOR) 20 MG tablet Take 1 tablet (20 mg total) by mouth daily. 07/07/20  Yes Sandre Kitty, MD  traZODone (DESYREL) 100 MG tablet Take 1 tablet (100 mg total) by mouth at bedtime. 09/08/20  Yes Zena Amos, MD  amoxicillin-clavulanate (AUGMENTIN) 875-125 MG tablet Take 1 tablet by mouth every 12 (twelve) hours. Patient not taking: No sig reported 09/12/20   Valinda Hoar, NP  cetirizine (ZYRTEC) 10 MG tablet Take 1 tablet (10 mg total) by mouth daily. Patient not taking: No sig reported 12/11/19   Melene Plan, MD  diclofenac Sodium (VOLTAREN) 1 % GEL Apply 2 g topically 4 (four) times daily. Patient not taking: No sig reported 07/07/20   Sandre Kitty, MD  ibuprofen (ADVIL) 800 MG tablet Take 1 tablet (800 mg total) by mouth 3 (three) times daily. Patient not taking: No sig reported 09/12/20   Valinda Hoar, NP    Allergies    Pork-derived products  Review of Systems   Review of Systems  Unable to perform ROS: Other   Cardiovascular: Positive for chest pain.    Physical Exam Updated Vital Signs BP (!) 127/103   Pulse 73   Resp 16   SpO2 98%   Physical Exam Vitals and nursing note reviewed.  Constitutional:      Appearance: He is not diaphoretic.     Comments: Appears alert, mildly distressed. Nontoxic.  HENT:     Head: Normocephalic and atraumatic.     Mouth/Throat:     Comments: No angioedema. Tolerating secretions without difficulty. Eyes:     General: No scleral icterus.    Conjunctiva/sclera: Conjunctivae normal.  Cardiovascular:     Rate and Rhythm: Normal rate and regular rhythm.     Pulses: Normal pulses.  Pulmonary:     Effort: Pulmonary effort is normal. No respiratory distress.     Breath sounds: No stridor. No wheezing or  rales.     Comments: Respirations irregular with episodes of hyperventilation. Adventitious breath sounds c/w smoking hx. Abdominal:     Comments: Soft, nondistended abdomen.  Musculoskeletal:        General: Normal range of motion.     Cervical back: Normal range of motion.     Right lower leg: No edema.     Left lower leg: No edema.  Skin:    General: Skin is warm and dry.     Coloration: Skin is not pale.     Findings: No erythema or rash.  Neurological:     Mental Status: He is alert and oriented to person, place, and time.     Coordination: Coordination normal.     Comments: Patient will follow commands. Moving all extremities spontaneously.  Psychiatric:        Behavior: Behavior normal.     ED Results / Procedures / Treatments   Labs (all labs ordered are listed, but only abnormal results are displayed) Labs Reviewed  BASIC METABOLIC PANEL - Abnormal; Notable for the following components:      Result Value   Glucose, Bld 125 (*)    BUN 21 (*)    All other components within normal limits  CBG MONITORING, ED - Abnormal; Notable for the following components:   Glucose-Capillary 124 (*)    All other components within normal limits  CBC   TROPONIN I (HIGH SENSITIVITY)  TROPONIN I (HIGH SENSITIVITY)    EKG EKG Interpretation  Date/Time:  Tuesday Nov 15 2020 04:46:37 EDT Ventricular Rate:  80 PR Interval:  122 QRS Duration: 88 QT Interval:  358 QTC Calculation: 412 R Axis:   72 Text Interpretation: Normal sinus rhythm Nonspecific T wave abnormality Abnormal ECG No significant change since 10/22/2019 Confirmed by Geoffery LyonseLo, Douglas ([id number]) on 11/15/2020 4:55:52 AM   Radiology DG Chest Portable 1 View  Result Date: 11/15/2020 CLINICAL DATA:  Chest pain. EXAM: PORTABLE CHEST 1 VIEW COMPARISON:  09/24/2019 FINDINGS: The heart size and mediastinal contours are within normal limits. Both lungs are clear. The visualized skeletal structures are unremarkable. IMPRESSION: No active disease. Electronically Signed   By: Maisie Fushomas  Register   On: 11/15/2020 05:05    Procedures Procedures   Medications Ordered in ED Medications  LORazepam (ATIVAN) injection 0.5 mg (0.5 mg Intravenous Given 11/15/20 0542)    ED Course  I have reviewed the triage vital signs and the nursing notes.  Pertinent labs & imaging results that were available during my care of the patient were reviewed by me and considered in my medical decision making (see chart for details).  Clinical Course as of 11/15/20 [id number]  Tue Nov 15, 2020  [id number] On chart review, history of normal cardiac catheterization in 2016.  Reassuring echocardiogram in 2019. [KH]  0600 Laboratory evaluation today is reassuring.  Troponin is negative.  Will require delta troponin for trending.  Chest x-ray without acute cardiopulmonary abnormality. [KH]  [phone] Patient reassessed.  He is resting comfortably.  Vital signs stable. [KH]    Clinical Course User Index [KH] Darylene PriceHumes, Latika Kronick, PA-C   MDM Rules/Calculators/A&P                          45 year old male presents to the emergency department for complaints of chest pain which woke him from sleep.  He does have a history of chronic, intermittent  chest pain.  [place]  His cardiac  catheterization in 2016 showed normal coronaries.  Had a reassuring echocardiogram in 2019.  Cardiac work-up in the emergency department has been reassuring.  He was given Ativan for management of his symptoms given longstanding history of anxiety and depression.  He is resting comfortably on reassessment.  Will require delta troponin for trending.  Anticipate discharge if repeat troponin negative.  Care signed out to [place], [place] at shift change.   Final Clinical Impression(s) / ED Diagnoses Final diagnoses:  Chest pain, unspecified type    Rx / DC Orders ED Discharge Orders    None       Antony Madura, PA-C 11/15/20 3704    Geoffery Lyons, MD 11/15/20 [phone]

## 2020-11-15 NOTE — ED Notes (Signed)
Pt reports he is dizzy and refused to ambulate at this time. Will reassess after fluids

## 2020-11-15 NOTE — ED Triage Notes (Addendum)
Pt c/o chest pain while sleeping. Difficulty moving. Hx cardiac surgery.   Pt clinching chest in triage.

## 2020-11-15 NOTE — ED Provider Notes (Signed)
Care handoff received from [facility] PA-C at shift change please see previous providers note for full details of visit.  In short 45 year old male presented to the emergency department for chest pain which woke him up from sleep.  He has a history of chronic chest pain with 2 previous cardiac catheter[place] and 1 cardiac catheterization in the [place] in 2016 which showed normal coronaries.  Patient also had a reassuring echocardiogram in 2019.  Initial troponin and basic labs here were reassuring.  Patient was given Ativan for his symptoms given the history of anxiety/depression.  Patient is awaiting the delta troponin, anticipate discharge if negative.  Plan of care is to follow-up on the second troponin and disposition the patient. Physical Exam  BP (!) 132/93   Pulse 71   Temp 98 F (36.7 C) (Oral)   Resp 10   SpO2 99%   Physical Exam Constitutional:      General: He is not in acute distress.    Appearance: Normal appearance. He is well-developed. He is not ill-appearing or diaphoretic.  HENT:     Head: Normocephalic and atraumatic.  Eyes:     General: Vision grossly intact. Gaze aligned appropriately.     Pupils: Pupils are equal, round, and reactive to light.  Neck:     Trachea: Trachea and phonation normal.  Pulmonary:     Effort: Pulmonary effort is normal. No respiratory distress.  Abdominal:     General: There is no distension.     Palpations: Abdomen is soft.     Tenderness: There is no abdominal tenderness. There is no guarding or rebound.  Musculoskeletal:        General: Normal range of motion.     Cervical back: Normal range of motion.  Skin:    General: Skin is warm and dry.  Neurological:     Mental Status: He is alert.     GCS: GCS eye subscore is 4. GCS verbal subscore is 5. GCS motor subscore is 6.     Comments: Speech is clear and goal oriented, follows commands Major Cranial nerves without deficit, no facial droop 3/5 strength bilateral extremities,  somewhat weaker left compared to right.  Unclear if effort dependent. Unsteady gait.  Psychiatric:        Behavior: Behavior normal.     ED Course/Procedures   Clinical Course as of 11/15/20 0806  Tue Nov 15, 2020  0449 On chart review, history of normal cardiac catheterization in 2016.  Reassuring echocardiogram in 2019. [KH]  0600 Laboratory evaluation today is reassuring.  Troponin is negative.  Will require delta troponin for trending.  Chest x-ray without acute cardiopulmonary abnormality. [KH]  [phone] Patient reassessed.  He is resting comfortably.  Vital signs stable. [KH]    Clinical Course User Index [KH] Antony Madura, PA-C    Procedures  MDM  Additional history obtained from: 1. Nursing notes from this visit. -============== Delta troponin within normal limits.  Vital signs stable.  Patient reassessed resting comfortably in bed.  He states understanding of work-up and has not no questions.  Work-up today is inconsistent with ACS, PE, dissection or other emergent cardiopulmonary pathologies.  However now he reports difficulty speaking due to tongue heaviness and left-sided weakness starting today.  On exam he does have bilateral weakness but left greater than right, unclear if this is effort dependent.  Additionally he is unable to leave the bed ambulate due to dizziness.  Given patient's symptoms feel he will need MRI for  further evaluation.  MRI brain noncontrast ordered. Discussed plan with Dr. Jodi Mourning who agrees. - MRI Brain:  IMPRESSION:  No evidence of acute intracranial abnormality. No acute infarct.  - Orthostatics obtained by nursing staff and are unremarkable.  Patient given IV fluids along with meclizine.  I reassessed the patient attempted to ambulate him around the room, he is unsteady on his feet and needs assistance with standing still.  Will attempt Valium 2.5 mg IV to help with the vertiginous symptoms and reassess. ----- 2:24 PM: Consult with family medicine  and they will come and see the patient.  Consult placed to neurology. - 3:07 PM: Consult with neurology Dr. Iver Nestle who will consult on patient. - 3:14 PM: Updated Dr. Gerarda Fraction on neurology.  Patient accepted to family medicine team.   Note: Portions of this report may have been transcribed using voice recognition software. Every effort was made to ensure accuracy; however, inadvertent computerized transcription errors may still be present.   Elizabeth Palau 11/15/20 1514    Blane Ohara, MD 11/15/20 [phone]

## 2020-11-15 NOTE — Hospital Course (Signed)
Aaron Reyes is a 45 y.o. male presenting with chest pain and weakness. PMH is significant for chronic chest pain s/p normal cardiac catheterization (2016),  PTSD, anxiety, depression, and esophageal reflux   Weakness, Headache  Presents with increased weakness, heaviness, and headache that began when awakening this morning. On presentation, vitals stable. Physical and neuro exam normal. Labs including troponin wnl. EKG NSR. CXR and MRI normal. In ED received Meclizine, Valium, and 1L fluid bolus. Given patients history of similar concerns and anxiety, and patient with increased stress, symptoms likely psychosomatic. Neurology consulted and recommended ***.   GAD, MDD Patient with increased stressors financially and regarding changes to citizenship. Patient was continued on Lexapro 10mg .  All other conditions chronic and stable.

## 2020-11-15 NOTE — ED Notes (Signed)
Admitting Dr. At bedside with interpretor.

## 2020-11-15 NOTE — ED Notes (Signed)
Pt refused to ambulate

## 2020-11-15 NOTE — Progress Notes (Signed)
Patient arrived to 5C12 in NAD, VS stable. Patient oriented to room and admission complete. Interpreter used to complete entire admission. Wife and son will come to see the patient.

## 2020-11-15 NOTE — ED Notes (Signed)
Attempted to call 5C to give report, left call back number.

## 2020-11-15 NOTE — Evaluation (Signed)
Physical Therapy Evaluation Patient Details Name: Aaron Reyes MRN: [id number] DOB: 12-06-1975 Today's Date: 11/15/2020   History of Present Illness  45 y.o. male admitted on 11/15/20 for left sided weakness, chest pain, and heaviness of legs and tongue.  Pt's MRI was negative, as well as troponins.  Concern for pyschosomatic cause.PTSD, MI, sternal fx, bil hearing loss, DM2.  Clinical Impression  Pt presents with difficulty standing due to bil LE and L UE weakness, however, there are inconsistencies in his supine strength testing and his functional movements observed.  He could barely resist strength testing in his L UE, however, he used the L UE to pull to sitting with force and supported himself with his L UE on the walker.  When tested, he was hardly able to flex his knees in the bed for me, however, when we returned to supine, he pulled his knees up quickly into flexion to re-adjust in the bed.  Waddell's straight leg raise sign was positive as well.  Of the BPs I was able to check (supine and sitting) orthostatics were negative.  A quick visual/vestibular scree was negative.  He most closely matches psychosomatic causes and should be encouraged with positivity that this will all get better.  I hesitate to give him any assistive device as I don't want to use it as a crutch to prolong his symptomology, but if one must be given, RW would be the best.  No WC, please.  Arabic tablet interpreter used the whole time.   PT to follow acutely for deficits listed below.     Follow Up Recommendations No PT follow up    Equipment Recommendations  Rolling walker with 5" wheels;Other (comment) (if he must have an AD, RW would be my choice, I don't want him to have a WC as I think it would impair his progression.)    Recommendations for Other Services       Precautions / Restrictions Precautions Precautions: Fall      Mobility  Bed Mobility Overal bed mobility: Needs Assistance Bed Mobility:  Supine to Sit;Sit to Supine     Supine to sit: Min assist Sit to supine: Mod assist   General bed mobility comments: Min hand held assist using left hand to pull trunk up to sitting EOB, unable to lift legs back into bed when returning to supine, assist provided at both legs.    Transfers Overall transfer level: Needs assistance Equipment used: None;4-wheeled walker Transfers: Sit to/from Stand Sit to Stand: Min assist;Mod assist         General transfer comment: Mod assist with left knee blocked and hand held assist x 3, pt suddenly falling over.  Did better min assist with use of rolling walker, stood longer and less LE buckling, pushing down heavily with left arm (resistance) and no longer buckling at knees, able to come to single leg stance on both sides without knee buckling supported by arms.  After ~1 min fell to his right side and sat down suddenly.  Ambulation/Gait             General Gait Details: I did not feel safe progressing gait even with AD as I was unsure of what he would do given his strength testing did not match his functonal presentation.  Stairs            Wheelchair Mobility    Modified Rankin (Stroke Patients Only)       Balance Overall balance assessment: Needs assistance Sitting-balance support:  Feet supported;Bilateral upper extremity supported;No upper extremity supported Sitting balance-Leahy Scale: Poor Sitting balance - Comments: at times he would tip backwards or to the right.  at times he would hold himself up well.  Sway observed was very sudden, not consistant throguhout sitting EOB. Postural control: Posterior lean;Right lateral lean Standing balance support: Bilateral upper extremity supported;Single extremity supported Standing balance-Leahy Scale: Poor Standing balance comment: needs support in standing from therapist and or assistive device.  He did better with assistive device.                              Pertinent Vitals/Pain Pain Assessment: Faces Faces Pain Scale: Hurts even more Pain Location: head, generalized body Pain Descriptors / Indicators: Aching;Burning Pain Intervention(s): Limited activity within patient's tolerance;Monitored during session;Repositioned    Home Living Family/patient expects to be discharged to:: Private residence Living Arrangements: Spouse/significant other;Children Available Help at Discharge: Family (wife and multiple children ranging from 32-17 yrs old.) Type of Home: House         Home Equipment: None      Prior Function Level of Independence: Independent         Comments: works in a [employer]   Dominant Hand: Right    Extremity/Trunk Assessment   Upper Extremity Assessment Upper Extremity Assessment: LUE deficits/detail LUE Deficits / Details: left arm with difficulty resisting strength testing, however, functionally he could pull his trunk and body up with his left hand. LUE Sensation: decreased light touch (whole arm, up to UT, L ear and left side of face)    Lower Extremity Assessment Lower Extremity Assessment: RLE deficits/detail;LLE deficits/detail RLE Deficits / Details: right leg ever so slightly stronger than L leg when tested in supine and sitting.  When tested ankle was 2+/5, knee 3-/5, hip 3-/5 bil.  Sensation deminished throughout both legs all the way up to thigh. RLE Sensation: decreased light touch (whole leg up to thigh) LLE Deficits / Details: right leg ever so slightly stronger than L leg when tested in supine and sitting.  When tested ankle was 2+/5, knee 3-/5, hip 3-/5 bil.  Sensation deminished throughout both legs all the way up to thigh. LLE Sensation: decreased light touch (whole leg up to thigh)    Cervical / Trunk Assessment Cervical / Trunk Assessment: Normal  Communication   Communication: Prefers language other than English (Arabic, used video interpreter HUTM [id number])   Cognition Arousal/Alertness: Awake/alert Behavior During Therapy: WFL for tasks assessed/performed Overall Cognitive Status: Within Functional Limits for tasks assessed                                        General Comments General comments (skin integrity, edema, etc.): what orthostatics I could get (supine and sitting) were negative.  Gross visual screen did not reveal any vestibular signs and I did not test his canals.  Waddell's leg lift sign was positive for poor effort.    Exercises     Assessment/Plan    PT Assessment Patient needs continued PT services  PT Problem List Decreased strength;Decreased activity tolerance;Decreased balance;Decreased mobility;Decreased knowledge of use of DME;Impaired sensation;Pain       PT Treatment Interventions DME instruction;Gait training;Functional mobility training;Stair training;Therapeutic activities;Therapeutic exercise;Balance training;Neuromuscular re-education;Cognitive remediation;Patient/family education    PT Goals (Current goals can be found in the Care  Plan section)  Acute Rehab PT Goals Patient Stated Goal: to get strong again PT Goal Formulation: With patient Time For Goal Achievement: 11/29/20 Potential to Achieve Goals: Good    Frequency Min 3X/week   Barriers to discharge        Co-evaluation               AM-PAC PT "6 Clicks" Mobility  Outcome Measure Help needed turning from your back to your side while in a flat bed without using bedrails?: A Little Help needed moving from lying on your back to sitting on the side of a flat bed without using bedrails?: A Little Help needed moving to and from a bed to a chair (including a wheelchair)?: A Lot Help needed standing up from a chair using your arms (e.g., wheelchair or bedside chair)?: A Lot Help needed to walk in hospital room?: A Lot Help needed climbing 3-5 steps with a railing? : Total 6 Click Score: 13    End of Session   Activity  Tolerance: Patient limited by fatigue Patient left: in bed;with call bell/phone within reach Nurse Communication: Mobility status PT Visit Diagnosis: Muscle weakness (generalized) (M62.81);Difficulty in walking, not elsewhere classified (R26.2);Pain Pain - Right/Left:  (generalized) Pain - part of body:  (generalized)    Time: 0347-4259 PT Time Calculation (min) (ACUTE ONLY): 45 min   Charges:   PT Evaluation $PT Eval Moderate Complexity: 1 Mod PT Treatments $Therapeutic Activity: 23-37 mins       Corinna Capra, PT, DPT  Acute Rehabilitation Ortho Tech Supervisor [phone] pager [phone]) [phone] office

## 2020-11-15 NOTE — ED Notes (Signed)
Patient transported to MRI 

## 2020-11-15 NOTE — H&P (Addendum)
Family Medicine Teaching [facility] Admission History and Physical Service Pager: [phone]  Patient name: Aaron Reyes [facility] Medical record number: [id number] Date of birth: 03/25/1976 Age: 45 y.o. Gender: male  Primary Care Provider: Melene Plan, MD Consultants: Neurology Code Status: Full  Preferred Emergency Contact: Alphonzo Dublin (spouse) [phone].   Chief Complaint: chest pain and weakness   Assessment and Plan: Aaron Reyes is a 45 y.o. male presenting with chest pain and weakness. PMH is significant for chronic chest pain s/p normal cardiac catheterization (2016),  PTSD, anxiety, depression, and esophageal reflux   Weakness Presents with increased weakness, heaviness, and headache that began when awakening this morning. On presentation, vitals stable. Physical and neuro exam normal. Labs including troponin wnl. EKG NSR. CXR and MRI normal. In ED received Meclizine, Valium, and 1L fluid bolus. Differentials would include orthostatic hypotension, given patient's dizziness with change in positions. Also consider vertigo, though patient does not endorse room spinning. Patient concerned that his weakness and tongue heaviness could be from stroke, but reassured him that MRI was negative and that he fortunately did not suffer from stroke. Given patients history of similar concerns and anxiety, and patient with increased stress, symptoms likely psychosomatic. Neurology consulted and will await any further recommendations. Will admit patient for monitoring and observation, will likely discharge later this evening or tomorrow morning.  - admit to FPTS, med-surg, attending Dr. Manson Passey - neurology consulted, appreciate recommendations - can consider compazine and benadryl for migraine  - vitals per floor - Tylenol 650mg  q 6prn - can consider migraine cocktail of compazine and benadryl  - PT/OT  Chest pain Patient with chronic sharp chest pain. Prior cardiac cath x3 without CAD.  ED EKG  showed normal sinus rhythm Trop negative. Provided reassurance to patient.  This may be related to patient's anxiety.  It may also be related to GERD but the patient is on Protonix 40 mg. - continue to monitor - Tylenol 650mg  prn   GAD, MDD  Patient under increased stress lately financially, and regarding changes to citizenship. Likely contributing to patient's chronic pain.   - continue Lexapro 10 mg   DM2 Blood glucose 125 on admission.  Medications include metformin 1000 mg - continue Metformin 1000 mg   Tobacco Use Reports smoking for the past 20 years, initially 1ppd, currently 1-3 cigarettes a day. - encourage cessation - can offer nicotine patch if needed   GERD - continue Protonix 40mg    FEN/GI: regular diet  Prophylaxis: SCDs. Can not use Lovenox because of pork product  Disposition: Med-surg   History of Present Illness:  Aaron Reyes is a 45 y.o. male presenting with weakness, migraine, and chest pain.   Arabic interpreter used via IPAD   Patient complains of weakness, heaviness, and chest pain similar to chronic pain that started last night at about 4 AM.  He reports the weakness is left sided. He was getting out of bed, noted left sided weakness and collapsed to the floor. He reports a similar episode a year ago and during lunch 1 day prior. He reports the episode of chest pain was similar to prior. He reports significant stress due to inflation, recent changes to SSI (citizenship) and increased financial stress. He reports his weakness, continued tongue and overall body heaviness. He endorses a severe headache with sensitivity to light and sound. He does endorse improvement since arrival.   Denies alcohol use, smokes 1-3 cigarettes a day for about 20 years, denies recreational drug use  Review Of Systems: Per HPI   Review of Systems  Constitutional: Positive for appetite change.  HENT: Negative for congestion and rhinorrhea.   Eyes: Positive for  photophobia.  Respiratory: Negative for shortness of breath.   Cardiovascular: Positive for chest pain and palpitations.  Gastrointestinal: Negative for nausea and vomiting.  Genitourinary: Negative for dysuria.  Neurological: Positive for speech difficulty, weakness, numbness and headaches.  Psychiatric/Behavioral: The patient is nervous/anxious.      Patient Active Problem List   Diagnosis Date Noted  . Neuropathy 09/22/2020  . Patient takes NSAID (non-steroid anti-inflammatory drug) 09/22/2020  . Hyperlipidemia associated with type 2 diabetes mellitus (HCC) 09/22/2020  . Medication refill 08/02/2020  . Encounter for review of form with patient 05/27/2020  . Major depressive disorder, recurrent episode with anxious distress (HCC) 03/16/2020  . Fatigue 03/08/2020  . Right shoulder pain 02/15/2020  . Allergic rhinitis 12/15/2019  . Recent unintentional weight loss over several months 10/25/2019  . Diabetes mellitus without complication (HCC) 07/13/2019  . Language barrier 01/02/2019  . Tobacco abuse 01/01/2018  . Chronic pain of both knees 02/21/2017  . GERD (gastroesophageal reflux disease) 09/04/2016  . Generalized anxiety disorder 01/26/2016  . Post traumatic stress disorder 01/26/2016    Past Medical History: Past Medical History:  Diagnosis Date  . Bilateral sensorineural hearing loss 02/07/2017  . Cervicalgia 10/19/2016  . Closed fracture of body of sternum 06/09/2018  . Food insecurity 01/02/2019  . GERD (gastroesophageal reflux disease)   . Kidney stone   . Myocardial infarct (HCC)   . Non-cardiac chest pain   . 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 PTSD (post-traumatic stress disorder)   . S/P cardiac catheterization 03/2015   cath in [place] and [place], no records,  cath at [facility] normal coronary arteries and normal LV function.  . Testicular mass 03/18/2015    Past Surgical History: Past Surgical History:  Procedure Laterality Date  . CARDIAC  CATHETERIZATION    . CARDIAC CATHETERIZATION     x 2  . CARDIAC CATHETERIZATION N/A 04/11/2015   Procedure: Left Heart Cath and Coronary Angiography;  Surgeon: Lyn Records, MD;  Location: [facility] INVASIVE CV LAB;  Service: Cardiovascular;  Laterality: N/A;  . HERNIA REPAIR      Social History: Social History   Tobacco Use  . Smoking status: Current Every Day Smoker    Packs/day: 0.50    Types: Cigarettes  . Smokeless tobacco: Never Used  Substance Use Topics  . Alcohol use: No  . Drug use: No    Family History: Family History  Problem Relation Age of Onset  . CAD Father   . Heart attack Father   . Hypertension Father   . Stroke Father   . Cancer Mother   . Diabetes Mother   . Hypertension Mother     Allergies and Medications: Allergies  Allergen Reactions  . Pork-Derived Products Other (See Comments)    per religious preference   Current Facility-Administered Medications on File Prior to Encounter  Medication Dose Route Frequency Provider Last Rate Last Admin  . hepatitis A virus (PF) vaccine (HAVRIX (PF)) injection 1,440 Units  1 mL Intramuscular Once Marquette Saa, MD       Current Outpatient Medications on File Prior to Encounter  Medication Sig Dispense Refill  . acetaminophen (TYLENOL) 500 MG tablet Take 1,000 mg by mouth every 6 (six) hours as needed for moderate pain or headache.    . Acetaminophen-Caffeine (EXCEDRIN TENSION HEADACHE PO) Take 1  tablet by mouth daily as needed (headache).    Marland Kitchen aspirin 81 MG chewable tablet Chew 1 tablet (81 mg total) by mouth daily. 90 tablet 3  . escitalopram (LEXAPRO) 10 MG tablet Take 1 tablet (10 mg total) by mouth daily. 30 tablet 2  . gabapentin (NEURONTIN) 300 MG capsule Take 1 capsule (300 mg total) by mouth 3 (three) times daily. Take 300 mg at bedtime for the first three days. If you continue to have pain, increase to 600 mg. After three days, you can increase to 900 mg at bedtime. (Patient taking differently:  Take 900 mg by mouth at bedtime.) 150 capsule 0  . LORazepam (ATIVAN) 0.5 MG tablet Take 1 tablet (0.5 mg total) by mouth 2 (two) times daily as needed for anxiety. 60 tablet 2  . metFORMIN (GLUCOPHAGE-XR) 500 MG 24 hr tablet Take 2 tablets (1,000 mg total) by mouth daily with breakfast. 180 tablet 3  . Multiple Vitamin (MULTIVITAMIN ADULT) TABS Take 1 tablet by mouth daily. 30 tablet 11  . pantoprazole (PROTONIX) 40 MG tablet Take 1 tablet (40 mg total) by mouth 2 (two) times daily. 180 tablet 3  . rosuvastatin (CRESTOR) 20 MG tablet Take 1 tablet (20 mg total) by mouth daily. 90 tablet 3  . traZODone (DESYREL) 100 MG tablet Take 1 tablet (100 mg total) by mouth at bedtime. 30 tablet 2  . amoxicillin-clavulanate (AUGMENTIN) 875-125 MG tablet Take 1 tablet by mouth every 12 (twelve) hours. (Patient not taking: No sig reported) 14 tablet 0  . cetirizine (ZYRTEC) 10 MG tablet Take 1 tablet (10 mg total) by mouth daily. (Patient not taking: No sig reported) 30 tablet 11  . diclofenac Sodium (VOLTAREN) 1 % GEL Apply 2 g topically 4 (four) times daily. (Patient not taking: No sig reported) 150 g 5  . ibuprofen (ADVIL) 800 MG tablet Take 1 tablet (800 mg total) by mouth 3 (three) times daily. (Patient not taking: No sig reported) 50 tablet 0    Objective: BP 135/86   Pulse 66   Temp 98 F (36.7 C) (Oral)   Resp 15   SpO2 97%   Physical Exam Constitutional:      General: He is not in acute distress. HENT:     Head: Normocephalic and atraumatic.  Cardiovascular:     Rate and Rhythm: Normal rate and regular rhythm.     Heart sounds: Normal heart sounds.  Pulmonary:     Effort: Pulmonary effort is normal.     Breath sounds: Normal breath sounds.  Abdominal:     Palpations: Abdomen is soft.  Musculoskeletal:        General: Normal range of motion.     Cervical back: Normal range of motion and neck supple.     Right lower leg: No edema.     Left lower leg: No edema.  Skin:    General:  Skin is warm and dry.  Neurological:     General: No focal deficit present.     Mental Status: He is alert and oriented to person, place, and time.     Comments: Cranial nerves 2-12 in tact. Sensation in tact bilaterally. 5/5 strength bilaterally. Reflexes normal   Psychiatric:        Mood and Affect: Mood normal.        Behavior: Behavior normal.     Labs and Imaging: CBC BMET  Recent Labs  Lab 11/15/20 0449  WBC 9.2  HGB 15.4  HCT 45.4  PLT  235   Recent Labs  Lab 11/15/20 0449  NA 138  K 4.0  CL 103  CO2 25  BUN 21*  CREATININE 0.99  GLUCOSE 125*  CALCIUM 8.9     EKG: NSR. Rate 80bpm    Cora Collumaige, Victoria J, DO 11/15/2020, 2:47 PM PGY-1, [facility] FPTS Intern pager: [phone], text pages welcome  FPTS Upper-Level Resident Addendum   I have independently interviewed and examined the patient. I have discussed the above with the original author and agree with their documentation. Please see also any attending notes.   Derrel NipVictor Helaine Yackel, MD PGY-2, [facility] 11/15/2020 5:15 PM  FPTS Service pager: [phone] (text pages welcome through [employer])

## 2020-11-16 ENCOUNTER — Other Ambulatory Visit (HOSPITAL_COMMUNITY): Payer: Self-pay

## 2020-11-16 DIAGNOSIS — R531 Weakness: Secondary | ICD-10-CM | POA: Diagnosis not present

## 2020-11-16 DIAGNOSIS — R42 Dizziness and giddiness: Secondary | ICD-10-CM

## 2020-11-16 LAB — GLUCOSE, CAPILLARY: Glucose-Capillary: 150 mg/dL — ABNORMAL HIGH (ref 70–99)

## 2020-11-16 MED ORDER — DIPHENHYDRAMINE HCL 50 MG/ML IJ SOLN
25.0000 mg | Freq: Once | INTRAMUSCULAR | Status: AC
  Administered 2020-11-16: 25 mg via INTRAVENOUS
  Filled 2020-11-16: qty 1

## 2020-11-16 MED ORDER — BUSPIRONE HCL 5 MG PO TABS
5.0000 mg | ORAL_TABLET | Freq: Two times a day (BID) | ORAL | Status: DC
  Administered 2020-11-16: 5 mg via ORAL
  Filled 2020-11-16 (×2): qty 1

## 2020-11-16 MED ORDER — PROCHLORPERAZINE EDISYLATE 10 MG/2ML IJ SOLN
10.0000 mg | Freq: Once | INTRAMUSCULAR | Status: AC
  Administered 2020-11-16: 10 mg via INTRAVENOUS
  Filled 2020-11-16: qty 2

## 2020-11-16 MED ORDER — BUSPIRONE HCL 10 MG PO TABS
5.0000 mg | ORAL_TABLET | Freq: Two times a day (BID) | ORAL | 0 refills | Status: DC
  Filled 2020-11-16: qty 30, 30d supply, fill #0

## 2020-11-16 NOTE — Progress Notes (Signed)
RN gave DC instructions to the patient and his family at bedside with the help of [facility] interpretor ID number [phone]. Pt stated understanding, IV has been removed and the patient received his work note. Home prescription dropped off by [facility] pharmacy.

## 2020-11-16 NOTE — Discharge Summary (Signed)
Family Medicine Teaching [facility] Discharge Summary  Patient name: Aaron [facility]yes [facility] Medical record number: [id number] Date of birth: 07-07-1975 Age: 45 y.o. Gender: male Date of Admission: 11/15/2020  Date of Discharge: 11/16/2020 Admitting Physician: Westley Chandler, MD  Primary Care Provider: Melene Plan, MD Consultants: Neurology  Indication for Hospitalization: Migraine  Discharge Diagnoses/Problem List:  Headache Vertigo PTSD Severe anxiety Depression Chronic chest pain GERD T2DM Tobacco Use  Disposition: Home  Discharge Condition: Improved, stable  Discharge Exam:  General: NAD, pleasant HEENT: PERRL, pterygium noted on L eye Cardiac: RRR, normal S1/S2 without m/r/g Respiratory: CTAB, normal effort Extremities: no edema or cyanosis Skin: warm and dry, no rashes noted Neuro: alert, CN II-XII intact, 5/5 strength in all extremities, sensation intact throughout.  Psych: Appears somewhat anxious, mildly flat affect although difficult to assess due to language barrier, normal speech  Brief Hospital Course:  Aaron Reyes is a 45 y.o. male who presented with chest pain and weakness. PMH is significant for chronic chest pain s/p normal cardiac catheterization (2016), PTSD, anxiety, depression, and esophageal reflux.  Weakness, Headache  Patient presented with weakness, dizziness, and headache x1 day. On presentation, vitals (including orthostatic vitals) were stable. Physical exam including complete neuro exam was normal. MRI brain was unremarkable. Neurology was consulted and recommended conservative management of his headache. His headache improved with Compazine and Benadryl x1. Ultimately symptoms thought to be migraine vs somatization of his anxiety.  See discussion of GAD/MDD/PTSD below.  Chest Pain Patient also complained of chest pain on admission. He has a long history of chronic chest pain and has had prior cardiac cath x3 without CAD. EKG showed  normal sinus rhythm and troponin wnl. His chest pain resolved the following morning. Etiology thought to be related to patient's anxiety (see below).   Severe GAD, MDD, PTSD Patient with hx of PTSD, GAD, and MDD. He endorsed significantly increased stressors, both financially and regarding changes to citizenship. Patient was continued on Lexapro 10mg  daily throughout admission. He is also on Trazodone qhs and Ativan prn at home per psychiatry. Buspar 5mg  BID was added at discharge.    All other conditions chronic and stable.    Issues for Follow Up:  1. Severe anxiety/PTSD with somatic manifestations. Buspar added on discharge. Consider increasing Lexapro and/or adding prazosin for nightmares.    Significant Procedures: None  Significant Labs and Imaging:  Recent Labs  Lab 11/15/20 0449  WBC 9.2  HGB 15.4  HCT 45.4  PLT 235   Recent Labs  Lab 11/15/20 0449  NA 138  K 4.0  CL 103  CO2 25  GLUCOSE 125*  BUN 21*  CREATININE 0.99  CALCIUM 8.9    MR BRAIN WO CONTRAST Result Date: 11/15/2020 IMPRESSION: No evidence of acute intracranial abnormality.  No acute infarct.   DG Chest Portable 1 View Result Date: 11/15/2020 IMPRESSION: No active disease.   Results/Tests Pending at Time of Discharge: None  Discharge Medications:  Allergies as of 11/16/2020      Reactions   Pork-derived Products Other (See Comments)   per religious preference      Medication List    STOP taking these medications   amoxicillin-clavulanate 875-125 MG tablet Commonly known as: AUGMENTIN   EXCEDRIN TENSION HEADACHE PO     TAKE these medications   acetaminophen 500 MG tablet Commonly known as: TYLENOL Take 1,000 mg by mouth every 6 (six) hours as needed for moderate pain or headache.   aspirin 81 MG  chewable tablet Chew 1 tablet (81 mg total) by mouth daily.   busPIRone 5 MG tablet Commonly known as: BUSPAR Take 1 tablet (5 mg total) by mouth 2 (two) times daily.   escitalopram 10  MG tablet Commonly known as: Lexapro Take 1 tablet (10 mg total) by mouth daily.   gabapentin 300 MG capsule Commonly known as: NEURONTIN Take 1 capsule (300 mg total) by mouth 3 (three) times daily. Take 300 mg at bedtime for the first three days. If you continue to have pain, increase to 600 mg. After three days, you can increase to 900 mg at bedtime. What changed:   how much to take  when to take this  additional instructions   LORazepam 0.5 MG tablet Commonly known as: Ativan Take 1 tablet (0.5 mg total) by mouth 2 (two) times daily as needed for anxiety.   metFORMIN 500 MG 24 hr tablet Commonly known as: GLUCOPHAGE-XR Take 2 tablets (1,000 mg total) by mouth daily with breakfast.   Multivitamin Adult Tabs Take 1 tablet by mouth daily.   pantoprazole 40 MG tablet Commonly known as: Protonix Take 1 tablet (40 mg total) by mouth 2 (two) times daily.   rosuvastatin 20 MG tablet Commonly known as: CRESTOR Take 1 tablet (20 mg total) by mouth daily.   traZODone 100 MG tablet Commonly known as: DESYREL Take 1 tablet (100 mg total) by mouth at bedtime.       Discharge Instructions: Please refer to Patient Instructions section of EMR for full details.  Patient was counseled important signs and symptoms that should prompt return to medical care, changes in medications, dietary instructions, activity restrictions, and follow up appointments.   Follow-Up Appointments: Future Appointments  Date Time Provider Department Center  11/23/2020  2:30 PM Melene Plan, MD [facility]  11/28/2020  3:00 PM Zena Amos, MD [facility] None     Maury Dus, MD 11/16/2020, 2:18 PM PGY-1, [facility]

## 2020-11-16 NOTE — Evaluation (Signed)
Occupational Therapy Evaluation Patient Details Name: Aaron Reyes MRN: [id number] DOB: 1976/05/13 Today's Date: 11/16/2020    History of Present Illness 45 y.o. male admitted on 11/15/20 for left sided weakness, chest pain, and heaviness of legs and tongue.  Pt's MRI was negative, as well as troponins.  Concern for pyschosomatic cause.PTSD, MI, sternal fx, bil hearing loss, DM2.   Clinical Impression   Patient evaluated by Occupational Therapy with no further acute OT needs identified. All education has been completed and the patient has no further questions. Pt was able to complete ADLs with supervision to min guard assist.  Pt initially moving very well, but c/o dizziness with increased activity with decrease in balance.  He frequently stops to take breaks.  He did demonstrate horizontal nystagmus with Rt and Lt gaze.  Recommend OPPT for vestibular assessment. See below for any follow-up Occupational Therapy or equipment needs. OT is signing off. Thank you for this referral.      Follow Up Recommendations  Other (comment) (OPPT)    Equipment Recommendations  None recommended by OT    Recommendations for Other Services       Precautions / Restrictions Precautions Precautions: Fall Precaution Comments: Pt c/o dizziness Restrictions Weight Bearing Restrictions: No      Mobility Bed Mobility Overal bed mobility: Modified Independent Bed Mobility: Supine to Sit     Supine to sit: Modified independent (Device/Increase time)     General bed mobility comments: pt performed supine to long sit without assist to don socks then use of bed features to EOB    Transfers Overall transfer level: Needs assistance Equipment used: None Transfers: Sit to/from [employer] Sit to Stand: Supervision Stand pivot transfers: Min guard       General transfer comment: pt required supervision to stand to pull pants over hips, but required min guard assist with transfers  due to dizziness    Balance Overall balance assessment: Needs assistance Sitting-balance support: Feet supported;Bilateral upper extremity supported;No upper extremity supported Sitting balance-Leahy Scale: Good Sitting balance - Comments: no LOB sitting unsupported   Standing balance support: No upper extremity supported Standing balance-Leahy Scale: Fair Standing balance comment: Pt needs support in standing from therapist, furniture and/ or assistive device.  He did better with assistive device.                           ADL either performed or assessed with clinical judgement   ADL Overall ADL's : Needs assistance/impaired Eating/Feeding: Independent   Grooming: Wash/dry hands;Brushing hair;Supervision/safety;Standing   Upper Body Bathing: Set up;Sitting   Lower Body Bathing: Supervison/ safety;Sit to/from stand   Upper Body Dressing : Set up;Sitting   Lower Body Dressing: Supervision/safety;Sit to/from stand   Toilet Transfer: Min guard;Ambulation;Comfort height toilet   Toileting- Clothing Manipulation and Hygiene: Min guard;Sit to/from stand       Functional mobility during ADLs: Min guard General ADL Comments: Pt initially required supervision with functional mobility, but progressed to min guard assist with instability noted during ambulation     Vision   Additional Comments: Horizontal nystagmus noted with Lt and Rt gaze     Perception     Praxis      Pertinent Vitals/Pain Pain Assessment: Faces Faces Pain Scale: Hurts little more Pain Location: head Pain Descriptors / Indicators: Headache Pain Intervention(s): Monitored during session;Limited activity within patient's tolerance     Hand Dominance Right   Extremity/Trunk Assessment Upper Extremity Assessment  Upper Extremity Assessment: Overall WFL for tasks assessed (UE function assessed through ADLs)   Lower Extremity Assessment Lower Extremity Assessment: Defer to PT evaluation    Cervical / Trunk Assessment Cervical / Trunk Assessment: Normal   Communication Communication Communication: Prefers language other than English (video interpreter utilized [name])   Cognition Arousal/Alertness: Awake/alert Behavior During Therapy: WFL for tasks assessed/performed;Anxious Overall Cognitive Status: Within Functional Limits for tasks assessed                                 General Comments: grossly WFL   General Comments  BP 123/74 sitting, BP 129/83 (97) standing; HR 68 bpm resting and 85 bpm standing, dizzy with standing worsens with head turns.  would benefit from OPPT follow-up.    Exercises     Shoulder Instructions      Home Living Family/patient expects to be discharged to:: Private residence Living Arrangements: Spouse/significant other;Children Available Help at Discharge: Family Type of Home: House Home Access: Stairs to enter     Home Layout: One level               Home Equipment: None          Prior Functioning/Environment Level of Independence: Independent        Comments: works in a factory        OT Problem List: Impaired balance (sitting and/or standing)      OT Treatment/Interventions:      OT Goals(Current goals can be found in the care plan section) Acute Rehab OT Goals Patient Stated Goal: to not be dizzy OT Goal Formulation: All assessment and education complete, DC therapy  OT Frequency:     Barriers to D/C:            Co-evaluation   Reason for Co-Treatment: Complexity of the patient's impairments (multi-system involvement);Necessary to address cognition/behavior during functional activity;For patient/therapist safety;To address functional/ADL transfers PT goals addressed during session: Mobility/safety with mobility;Balance;Proper use of DME        AM-PAC OT "6 Clicks" Daily Activity     Outcome Measure Help from another person eating meals?: None Help from another person taking  care of personal grooming?: A Little Help from another person toileting, which includes using toliet, bedpan, or urinal?: A Little Help from another person bathing (including washing, rinsing, drying)?: A Little Help from another person to put on and taking off regular upper body clothing?: A Little Help from another person to put on and taking off regular lower body clothing?: A Little 6 Click Score: 19   End of Session Equipment Utilized During Treatment: Gait belt Nurse Communication: Mobility status  Activity Tolerance: Patient limited by pain Patient left: in chair;with call bell/phone within reach  OT Visit Diagnosis: Dizziness and giddiness (R42)                Time: 1415-1450 OT Time Calculation (min): 35 min Charges:  OT General Charges $OT Visit: 1 Visit OT Evaluation $OT Eval Moderate Complexity: 1 Mod  Venora Kautzman C., OTR/L Acute Rehabilitation Services Pager [phone] Office [phone]   Jeani Hawking M 11/16/2020, 3:27 PM

## 2020-11-16 NOTE — Consult Note (Addendum)
NEURO HOSPITALIST CONSULT NOTE   Requestig physician: Dr. Nobie Putnamresenzo  Reason for Consult: New symptoms of chest pain, vertigo, limb numbness, heavy tongue and tongue numbness  History obtained from:   Patient and Chart   HPI:                                                                                                                                          Aaron Reyes is an 45 y.o. male with a PMHx of PTSD, cervicalgia, food insecurity, kidney stone, MI, non-cardiac chest pain, positive TB test and testicular mass who presented to the ED on Tuesday with new symptoms of chest pain, vertigo, limb numbness, heavy tongue and tongue numbness. Initial assessment in ED was concerning for functional overlay. He complains of vertigo several times during the exam, in addition to left sided leg numbness, left arm numbness, tongue heaviness and tongue numbness. He does change his complaints somewhat during exam, endorsing right leg numbness after initially stating his left side was symptomatic. He also expressed concern about symmetric hair loss along his posterior calves which appeared to have a distribution typical of hair loss due to effect of rubbing of clothing on calves and was reassured that this is normal.   The HPI documented by the admitting team was reviewed: "45 year old with history of severe PTSD, type 2 diabetes, and dyslipidemia presenting with dysequilibrium, suspect due to vertigo (question cause), possibly presentation of his migraines (has had in past). Endorses multiple stressors at home, mostly financial. He is very worried about how he can support his children. He endorses chronic chest pain, worse over the last week, similar to prior, heaviness of his tongue, overall weakness, dizziness up standing/head movement and headache. Chest pain is not as bad a prior episodes he reports. Has ongoing palpitations, unchanged from prior. No fevers but endorses  weakness."  He has multiple complaints including photophobia, chest pain, weakness, numbness, headache and palpitations.   Past Medical History:  Diagnosis Date  . Bilateral sensorineural hearing loss 02/07/2017  . Cervicalgia 10/19/2016  . Closed fracture of body of sternum 06/09/2018  . Food insecurity 01/02/2019  . GERD (gastroesophageal reflux disease)   . Kidney stone   . Myocardial infarct (HCC)   . Non-cardiac chest pain   . 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 PTSD (post-traumatic stress disorder)   . S/P cardiac catheterization 03/2015   cath in [place] and [name], no records,  [facility] normal coronary arteries and normal LV function.  . Testicular mass 03/18/2015    Past Surgical History:  Procedure Laterality Date  . CARDIAC CATHETERIZATION    . CARDIAC CATHETERIZATION     x 2  . CARDIAC CATHETERIZATION N/A 04/11/2015   Procedure: Left Heart Cath and Coronary  Angiography;  Surgeon: Lyn Records, MD;  Location: [facility] INVASIVE CV LAB;  Service: Cardiovascular;  Laterality: N/A;  . HERNIA REPAIR      Family History  Problem Relation Age of Onset  . CAD Father   . Heart attack Father   . Hypertension Father   . Stroke Father   . Cancer Mother   . Diabetes Mother   . Hypertension Mother              Social History:  reports that he has been smoking cigarettes. He has been smoking about 0.50 packs per day. He has never used smokeless tobacco. He reports that he does not drink alcohol and does not use drugs.  Allergies  Allergen Reactions  . Pork-Derived Products Other (See Comments)    per religious preference    MEDICATIONS:                                                                                                                     Prior to Admission:  Medications Prior to Admission  Medication Sig Dispense Refill Last Dose  . acetaminophen (TYLENOL) 500 MG tablet Take 1,000 mg by mouth every 6 (six) hours as needed for moderate pain or  headache.   11/14/2020 at Unknown time  . Acetaminophen-Caffeine (EXCEDRIN TENSION HEADACHE PO) Take 1 tablet by mouth daily as needed (headache).   Past Week at Unknown time  . aspirin 81 MG chewable tablet Chew 1 tablet (81 mg total) by mouth daily. 90 tablet 3 11/14/2020 at Unknown time  . escitalopram (LEXAPRO) 10 MG tablet Take 1 tablet (10 mg total) by mouth daily. 30 tablet 2 11/14/2020 at Unknown time  . gabapentin (NEURONTIN) 300 MG capsule Take 1 capsule (300 mg total) by mouth 3 (three) times daily. Take 300 mg at bedtime for the first three days. If you continue to have pain, increase to 600 mg. After three days, you can increase to 900 mg at bedtime. (Patient taking differently: Take 900 mg by mouth at bedtime.) 150 capsule 0 11/14/2020 at Unknown time  . LORazepam (ATIVAN) 0.5 MG tablet Take 1 tablet (0.5 mg total) by mouth 2 (two) times daily as needed for anxiety. 60 tablet 2 Past Week at Unknown time  . metFORMIN (GLUCOPHAGE-XR) 500 MG 24 hr tablet Take 2 tablets (1,000 mg total) by mouth daily with breakfast. 180 tablet 3 11/14/2020 at Unknown time  . Multiple Vitamin (MULTIVITAMIN ADULT) TABS Take 1 tablet by mouth daily. 30 tablet 11 11/14/2020 at Unknown time  . pantoprazole (PROTONIX) 40 MG tablet Take 1 tablet (40 mg total) by mouth 2 (two) times daily. 180 tablet 3 11/14/2020 at Unknown time  . rosuvastatin (CRESTOR) 20 MG tablet Take 1 tablet (20 mg total) by mouth daily. 90 tablet 3 11/14/2020 at Unknown time  . traZODone (DESYREL) 100 MG tablet Take 1 tablet (100 mg total) by mouth at bedtime. 30 tablet 2 11/14/2020 at Unknown time  . amoxicillin-clavulanate (AUGMENTIN) 875-125 MG tablet Take  1 tablet by mouth every 12 (twelve) hours. (Patient not taking: No sig reported) 14 tablet 0 Completed Course at Unknown time  . cetirizine (ZYRTEC) 10 MG tablet Take 1 tablet (10 mg total) by mouth daily. (Patient not taking: No sig reported) 30 tablet 11 Not Taking at Unknown time  . diclofenac  Sodium (VOLTAREN) 1 % GEL Apply 2 g topically 4 (four) times daily. (Patient not taking: No sig reported) 150 g 5 Not Taking at Unknown time  . ibuprofen (ADVIL) 800 MG tablet Take 1 tablet (800 mg total) by mouth 3 (three) times daily. (Patient not taking: No sig reported) 50 tablet 0 Not Taking at Unknown time   Scheduled: . aspirin  81 mg Oral Daily  . escitalopram  10 mg Oral Daily  . metFORMIN  1,000 mg Oral Q breakfast  . pantoprazole  40 mg Oral BID  . rosuvastatin  20 mg Oral Daily    ROS:                                                                                                                                       As per HPI.    Blood pressure 129/86, pulse 65, temperature 98.9 F (37.2 C), temperature source Oral, resp. rate 18, SpO2 97 %.   General Examination:                                                                                                       Physical Exam  HEENT-  [place]/AT  Lungs- Respirations unlabored Extremities- No edema. Hair loss symmetrically along posterior calves without rash.   Neurological Examination Mental Status: Alert, oriented, with pained affect that fluctuates during exam. Speech fluent in Arabic without evidence of aphasia based on answers to questions translated by Arabic interpreter. Naming and comprehension intact. No dysarthria.  Cranial Nerves: II: Temporal visual fields intact without extinction to DSS. PERRL.   III,IV, VI: No ptosis. EOMI. No nystagmus.  V: Facial temp sensation subjectively absent. Intact to FT bilaterally.  VII: Smile symmetric.  VIII: Hearing intact to voice IX,X: No hoarseness or hypophonia XI: Symmetric XII: Apparent inability to protrude tongue due to perceived sensation of heaviness. With mouth open, tongue is midline within the oral cavity.  Motor: Poor effort with BUE exam. 4+/5 proximally and distally with normal tone. Giveway weakness noted.  BLE with poor effort, max strength 4/5 with HF  and KE, 5/5 with ADF. Giveway weakness noted.  Increased latencies of motor  responses x 4 when attention directed to limbs, but moves limbs without hesitation and with normal fluid motion when distracted.  Sensory: FT intact x 4. No extinction to DSS. Subjectively absent temp sensation x 4.  Deep Tendon Reflexes:  Bilateral brachioradialis and biceps: 1+ Right patellar 0, left patellar 1+.  Plantars: Right: downgoing  Left: downgoing Cerebellar: No ataxia with FNF bilaterally. Exhibits a strained affect when performing.  Gait: Deferred   Lab Results: Basic Metabolic Panel: Recent Labs  Lab 11/15/20 0449  NA 138  K 4.0  CL 103  CO2 25  GLUCOSE 125*  BUN 21*  CREATININE 0.99  CALCIUM 8.9    CBC: Recent Labs  Lab 11/15/20 0449  WBC 9.2  HGB 15.4  HCT 45.4  MCV 86.0  PLT 235    Cardiac Enzymes: No results for input(s): CKTOTAL, CKMB, CKMBINDEX, TROPONINI in the last 168 hours.  Lipid Panel: No results for input(s): CHOL, TRIG, HDL, CHOLHDL, VLDL, LDLCALC in the last 168 hours.  Imaging: MR BRAIN WO CONTRAST  Result Date: 11/15/2020 CLINICAL DATA:  Neuro deficit.  Dizziness, left arm heaviness. EXAM: MRI HEAD WITHOUT CONTRAST TECHNIQUE: Multiplanar, multiecho pulse sequences of the brain and surrounding structures were obtained without intravenous contrast. COMPARISON:  CT head January 01, 2018. FINDINGS: Brain: No acute infarction, hemorrhage, hydrocephalus, extra-axial collection or mass lesion. Dilated perivascular spaces in the inferior basal ganglia bilaterally. Vascular: Major arterial flow voids are maintained at the skull base. Skull and upper cervical spine: Normal marrow signal. Sinuses/Orbits: Clear visualized sinuses.  Unremarkable orbits. Other: No mastoid effusions. IMPRESSION: No evidence of acute intracranial abnormality.  No acute infarct. Electronically Signed   By: Feliberto Harts MD   On: 11/15/2020 12:18   DG Chest Portable 1 View  Result Date:  11/15/2020 CLINICAL DATA:  Chest pain. EXAM: PORTABLE CHEST 1 VIEW COMPARISON:  09/24/2019 FINDINGS: The heart size and mediastinal contours are within normal limits. Both lungs are clear. The visualized skeletal structures are unremarkable. IMPRESSION: No active disease. Electronically Signed   By: Maisie Fus  Register   On: 11/15/2020 05:05    Assessment: 45 year old male with new symptoms of chest pain, vertigo, limb numbness, heavy tongue and tongue numbness 1. Exam reveals multiple inconsistencies. No clear localizing findings.  2. MRI brain is unremarkable.  3. Chest pain 4. Headache  Recommendations: 1. PT/OT/Speech 2. Work up for chest pain per primary team 3. Conservative management of headache.  4. May need ENT consult if vertigo persists.  5. Please call the Neurohospitalist service if there are additional questions.    Electronically signed: Dr. Caryl Pina 11/16/2020, 5:55 AM

## 2020-11-16 NOTE — Care Management (Signed)
Ordered walker with [employer]. They will bring to room prior to DC.

## 2020-11-16 NOTE — Discharge Instructions (Signed)
Dear Aaron Reyes,   Thank you for letting us participate in your care! In this section, you will find a brief summary of why you were admitted to the hospital, what happened during your admission, your diagnosis/diagnoses, and recommended follow up.   You were admitted because you were experiencing headache, dizziness and weakness.   Imaging of your brain was completely normal.   You were diagnosed with a migraine as well as anxiety.  You were also seen by our neurologists.  Your headache improved and you were discharged from the hospital for meeting this goal.    POST-HOSPITAL & CARE INSTRUCTIONS 1. Please let PCP/Specialists know of any changes in medications that were made.  2. Please see medications section of this packet for any medication changes.   DOCTOR'S APPOINTMENTS & FOLLOW UP Future Appointments  Date Time Provider Department Center  11/23/2020  2:30 PM Melene Plan, MD [facility]  11/28/2020  3:00 PM Zena Amos, MD [facility] None     Thank you for choosing [facility]! Take care and be well!  Family Medicine Teaching Service Inpatient Team [place]  [facility]  [address] [place], [place] [address] [phone]

## 2020-11-16 NOTE — Progress Notes (Signed)
Physical Therapy Treatment Patient Details Name: Aaron Reyes MRN: [id number] DOB: 1975/08/12 Today's Date: 11/16/2020    History of Present Illness 45 y.o. male admitted on 11/15/20 for left sided weakness, chest pain, and heaviness of legs and tongue.  Pt's MRI was negative, as well as troponins.  Concern for pyschosomatic cause.PTSD, MI, sternal fx, bil hearing loss, DM2.    PT Comments    Pt received in supine, agreeable to therapy session and with good participation and tolerance for hallway gait progression with +2 staff for safety. Pt limited due to evolving dizziness and returned to room, pt reports dizziness increased with cervical rotation and OT noted pt with nystagmus this date when looking to L/R sides. Pt may benefit from OPPT follow-up for further vestibular/balance work-up. 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 Pt continues to benefit from PT services to progress toward functional mobility goals. Discharge recommendations updated below per discussion with supervising PT Corinna Capra.  Follow Up Recommendations  Outpatient PT;Supervision for mobility/OOB     Equipment Recommendations  Rolling walker with 5" wheels;Other (comment) (may consider rollator for progression of activity out of the home)    Recommendations for Other Services       Precautions / Restrictions Precautions Precautions: Fall Precaution Comments: Pt c/o dizziness Restrictions Weight Bearing Restrictions: No    Mobility  Bed Mobility Overal bed mobility: Needs Assistance Bed Mobility: Supine to Sit     Supine to sit: Modified independent (Device/Increase time)     General bed mobility comments: pt performed supine to long sit without assist to don socks then use of bed features to EOB    Transfers Overall transfer level: Needs assistance Equipment used: Rolling walker (2 wheeled);None Transfers: Sit  to/from Stand Sit to Stand: Min guard;Min assist         General transfer comment: from EOB>HHA with minA and min guard to sit safely; no buckling this date  Ambulation/Gait Ambulation/Gait assistance: Min assist;Min guard Gait Distance (Feet): 100 Feet Assistive device: Rolling walker (2 wheeled);None Gait Pattern/deviations: Step-through pattern;Trunk flexed;Staggering left;Staggering right;Decreased stride length (downward gaze, lateral instability) Gait velocity: grossly <0.4 m/s   General Gait Details: into hallway with +2 staff for safety but needing mostly min guard, increased to minA with turns; short distance with no AD and minA HHA vs furniture support       Balance Overall balance assessment: Needs assistance Sitting-balance support: Feet supported;Bilateral upper extremity supported;No upper extremity supported Sitting balance-Leahy Scale: Fair Sitting balance - Comments: no LOB sitting unsupported   Standing balance support: Bilateral upper extremity supported;Single extremity supported Standing balance-Leahy Scale: Poor Standing balance comment: Pt needs support in standing from therapist, furniture and/ or assistive device.  He did better with assistive device.        Cognition Arousal/Alertness: Awake/alert Behavior During Therapy: WFL for tasks assessed/performed;Anxious Overall Cognitive Status: Within Functional Limits for tasks assessed                                 General Comments: pleasant, participatory      Exercises      General Comments General comments (skin integrity, edema, etc.): BP 123/74 sitting, BP 129/83 (97) standing; HR 68 bpm resting and 85 bpm standing, dizzy with standing worsens with head turns. Positive for lateral nystagmus, would benefit from OPPT follow-up.  Pertinent Vitals/Pain Pain Assessment: Faces Faces Pain Scale: Hurts little more Pain Location: head especially with turns Pain Descriptors /  Indicators: Headache Pain Intervention(s): Limited activity within patient's tolerance;Monitored during session;Repositioned    Home Living Family/patient expects to be discharged to:: Private residence Living Arrangements: Spouse/significant other;Children Available Help at Discharge: Family Type of Home: House Home Access: Stairs to enter   Home Layout: One level Home Equipment: None      Prior Function Level of Independence: Independent      Comments: works in a factory   PT Goals (current goals can now be found in the care plan section) Acute Rehab PT Goals Patient Stated Goal: to get strong again before going back to work in a factory PT Goal Formulation: With patient Time For Goal Achievement: 11/29/20 Potential to Achieve Goals: Good    Frequency    Min 3X/week      PT Plan Discharge plan needs to be updated    Co-evaluation PT/OT/SLP Co-Evaluation/Treatment: Yes Reason for Co-Treatment: Complexity of the patient's impairments (multi-system involvement);Necessary to address cognition/behavior during functional activity;For patient/therapist safety;To address functional/ADL transfers PT goals addressed during session: Mobility/safety with mobility;Balance;Proper use of DME        AM-PAC PT "6 Clicks" Mobility   Outcome Measure  Help needed turning from your back to your side while in a flat bed without using bedrails?: A Little Help needed moving from lying on your back to sitting on the side of a flat bed without using bedrails?: A Little Help needed moving to and from a bed to a chair (including a wheelchair)?: A Lot Help needed standing up from a chair using your arms (e.g., wheelchair or bedside chair)?: A Lot Help needed to walk in hospital room?: A Lot Help needed climbing 3-5 steps with a railing? : Total 6 Click Score: 13    End of Session Equipment Utilized During Treatment: Gait belt Activity Tolerance: Patient limited by fatigue;Other  (comment);Patient tolerated treatment well (dizzy) Patient left: with call bell/phone within reach;in chair (RN notified) Nurse Communication: Mobility status PT Visit Diagnosis: Muscle weakness (generalized) (M62.81);Difficulty in walking, not elsewhere classified (R26.2);Pain Pain - Right/Left:  (generalized) Pain - part of body:  (headache)     Time: 0300-9233 PT Time Calculation (min) (ACUTE ONLY): 27 min  Charges:  $Gait Training: 8-22 mins                     Deland Slocumb P., PTA Acute Rehabilitation Services Pager: [phone] Office: [phone]   [place] 11/16/2020, 3:10 PM

## 2020-11-23 ENCOUNTER — Encounter: Payer: Self-pay | Admitting: Family Medicine

## 2020-11-23 ENCOUNTER — Ambulatory Visit (INDEPENDENT_AMBULATORY_CARE_PROVIDER_SITE_OTHER): Payer: Medicaid Other | Admitting: Family Medicine

## 2020-11-23 ENCOUNTER — Other Ambulatory Visit: Payer: Self-pay

## 2020-11-23 VITALS — BP 128/81 | HR 76 | Ht 65.0 in | Wt 140.2 lb

## 2020-11-23 DIAGNOSIS — E119 Type 2 diabetes mellitus without complications: Secondary | ICD-10-CM | POA: Diagnosis not present

## 2020-11-23 DIAGNOSIS — Z789 Other specified health status: Secondary | ICD-10-CM

## 2020-11-23 DIAGNOSIS — H903 Sensorineural hearing loss, bilateral: Secondary | ICD-10-CM

## 2020-11-23 DIAGNOSIS — E0842 Diabetes mellitus due to underlying condition with diabetic polyneuropathy: Secondary | ICD-10-CM | POA: Diagnosis not present

## 2020-11-23 DIAGNOSIS — G629 Polyneuropathy, unspecified: Secondary | ICD-10-CM

## 2020-11-23 LAB — POCT GLYCOSYLATED HEMOGLOBIN (HGB A1C): HbA1c, POC (controlled diabetic range): 6.6 % (ref 0.0–7.0)

## 2020-11-23 NOTE — Assessment & Plan Note (Signed)
A1C improved to 6.6 today. Overall, good control, good compliance with medication with no side effect. Follow up in 3 months.

## 2020-11-23 NOTE — Assessment & Plan Note (Signed)
Video interpretor used for entirety of exam.

## 2020-11-23 NOTE — Assessment & Plan Note (Addendum)
Has had hearing aids in the past. Patient would benefit greatly. Recently sent to [facility] for evaluation, but did not see provider as he was told that they were not there any longer. He never received a call back for rescheduling. Will re-refer today. Patient should also be seen for vertiginous symptoms which he was admitted to the hospital for with negative work up.

## 2020-11-23 NOTE — Progress Notes (Signed)
    SUBJECTIVE:   CHIEF COMPLAINT / HPI:   Hospital followup for dizziness and numbness in the legs, tongue, arms, fingers.  Patient continues to have lightheadedness and some numbness with mild improvement. Recommendations for ENT follow up given dizziness and hx of B/L SNHL. Other symptoms concerning for severe anxiety with somatic manifestations. Patient continues to follow with psychiatry and I have explained the importance of continuing to go to his appointments q 3 months and take his medications.   B/L Sensorineural hearing loss Patient was seen by audiology in 2018 and fit with hearing aids. He reports that he lost the hearing aids and never found them. Needs new referral to ENT for hearing aids.  Saw Dr. Pollyann Kennedy (ENT) at [facility] on 10/10/20, though there are no notes attached to visit. He reports that he went to the appointment, but was told the provider was no longer in the office. He was never called again for resscheduling.   Leg/Foot Pain  Pain is located from his toes to knees. He reports "severe pain that feels like its going to the bone".  Worse when standing for long periods of time and sometimes with sleeping. Pain is stabbing quality. Improves with sitting down and with elevation. Sometimes has toe swelling int the morning. He was last seen for this on 4/6 and we increased his QHS gabapentin. He is currently taking 600 mg QHS. He reports that he is having improvement of his symptoms at night, but still has during the day.   Diabetes  A1C 6.6 today. Improvement from 7.1 in January. Patient reports compliance with medications. No side effects.   HLD Last lipid 09/21/20, tg 153  PERTINENT  PMH / PSH: GAD, MDD, PTSD, language barrier, tobacco abuse, chronic pain  OBJECTIVE:   BP 128/81   Pulse 76   Ht 5\' 5"  (1.651 m)   Wt 140 lb 3.2 oz (63.6 kg)   SpO2 97%   BMI 23.33 kg/m   General: well appearing male, NAD Lower extremities: 2 + DP, no swelling or erythema. 5/5  strength, 2 + patellar and achilles reflexes. Sensation intact and symmetric.   ASSESSMENT/PLAN:   Neuropathy Some improvement with QHS gabapentin. Obtained ABI given risk factors for PAD (DM, smoker, HLD) with normal results. Normal neurological exam. Unlikely radiculoapthy with no lumbar pain. Can also consider somatization. Given improvement with gabapentin, will continue with option to use TID for daytime pain. Patient should start with 300 mg if taking TID given risk of drowsiness. Continue to monitor. Discussed with Dr. who would also consider CK in the future given Crestor dosing.   Diabetes mellitus without complication (HCC) A1C improved to 6.6 today. Overall, good control, good compliance with medication with no side effect. Follow up in 3 months.   Bilateral sensorineural hearing loss Has had hearing aids in the past. Patient would benefit greatly. Recently sent to [facility]NT for evaluation, but did not see provider as he was told that they were not there any longer. He never received a call back for rescheduling. Will re-refer today. Patient should also be seen for vertiginous symptoms which he was admitted to the hospital for with negative work up.   Language barrier Video interpretor used for entirety of exam.   [facility], MD [facility]

## 2020-11-23 NOTE — Assessment & Plan Note (Addendum)
Some improvement with QHS gabapentin. Obtained ABI given risk factors for PAD (DM, smoker, HLD) with normal results. Normal neurological exam. Unlikely radiculoapthy with no lumbar pain. Can also consider somatization. Given improvement with gabapentin, will continue with option to use TID for daytime pain. Patient should start with 300 mg if taking TID given risk of drowsiness. Continue to monitor. Discussed with Dr. Deirdre Priest who would also consider CK in the future given Crestor dosing.

## 2020-11-28 ENCOUNTER — Ambulatory Visit (HOSPITAL_COMMUNITY): Payer: Medicaid Other | Admitting: Psychiatry

## 2020-12-07 ENCOUNTER — Other Ambulatory Visit: Payer: Self-pay

## 2020-12-07 ENCOUNTER — Encounter (HOSPITAL_COMMUNITY): Payer: Self-pay

## 2020-12-07 ENCOUNTER — Ambulatory Visit (HOSPITAL_COMMUNITY)
Admission: EM | Admit: 2020-12-07 | Discharge: 2020-12-07 | Disposition: A | Discharge status: Home / Self Care | Payer: Medicaid Other | Attending: Medical Oncology | Admitting: Medical Oncology

## 2020-12-07 ENCOUNTER — Ambulatory Visit (INDEPENDENT_AMBULATORY_CARE_PROVIDER_SITE_OTHER): Payer: Medicaid Other

## 2020-12-07 DIAGNOSIS — Z20822 Contact with and (suspected) exposure to covid-19: Secondary | ICD-10-CM | POA: Diagnosis not present

## 2020-12-07 DIAGNOSIS — F17210 Nicotine dependence, cigarettes, uncomplicated: Secondary | ICD-10-CM | POA: Diagnosis not present

## 2020-12-07 DIAGNOSIS — R059 Cough, unspecified: Secondary | ICD-10-CM | POA: Diagnosis not present

## 2020-12-07 DIAGNOSIS — J029 Acute pharyngitis, unspecified: Secondary | ICD-10-CM | POA: Insufficient documentation

## 2020-12-07 DIAGNOSIS — R0981 Nasal congestion: Secondary | ICD-10-CM | POA: Insufficient documentation

## 2020-12-07 DIAGNOSIS — K047 Periapical abscess without sinus: Secondary | ICD-10-CM | POA: Diagnosis not present

## 2020-12-07 DIAGNOSIS — J069 Acute upper respiratory infection, unspecified: Secondary | ICD-10-CM | POA: Diagnosis not present

## 2020-12-07 HISTORY — DX: Type 2 diabetes mellitus without complications: E11.9

## 2020-12-07 HISTORY — DX: Pure hypercholesterolemia, unspecified: E78.00

## 2020-12-07 LAB — SARS CORONAVIRUS 2 (TAT 6-24 HRS): SARS Coronavirus 2: NEGATIVE

## 2020-12-07 IMAGING — DX DG CHEST 2V
2 series · 2 of 2 positions shown · non-contrast
Comparison: [DATE]

CLINICAL DATA: Cough, congestion for 2 days

EXAM:
CHEST - 2 VIEW

[chest pa]
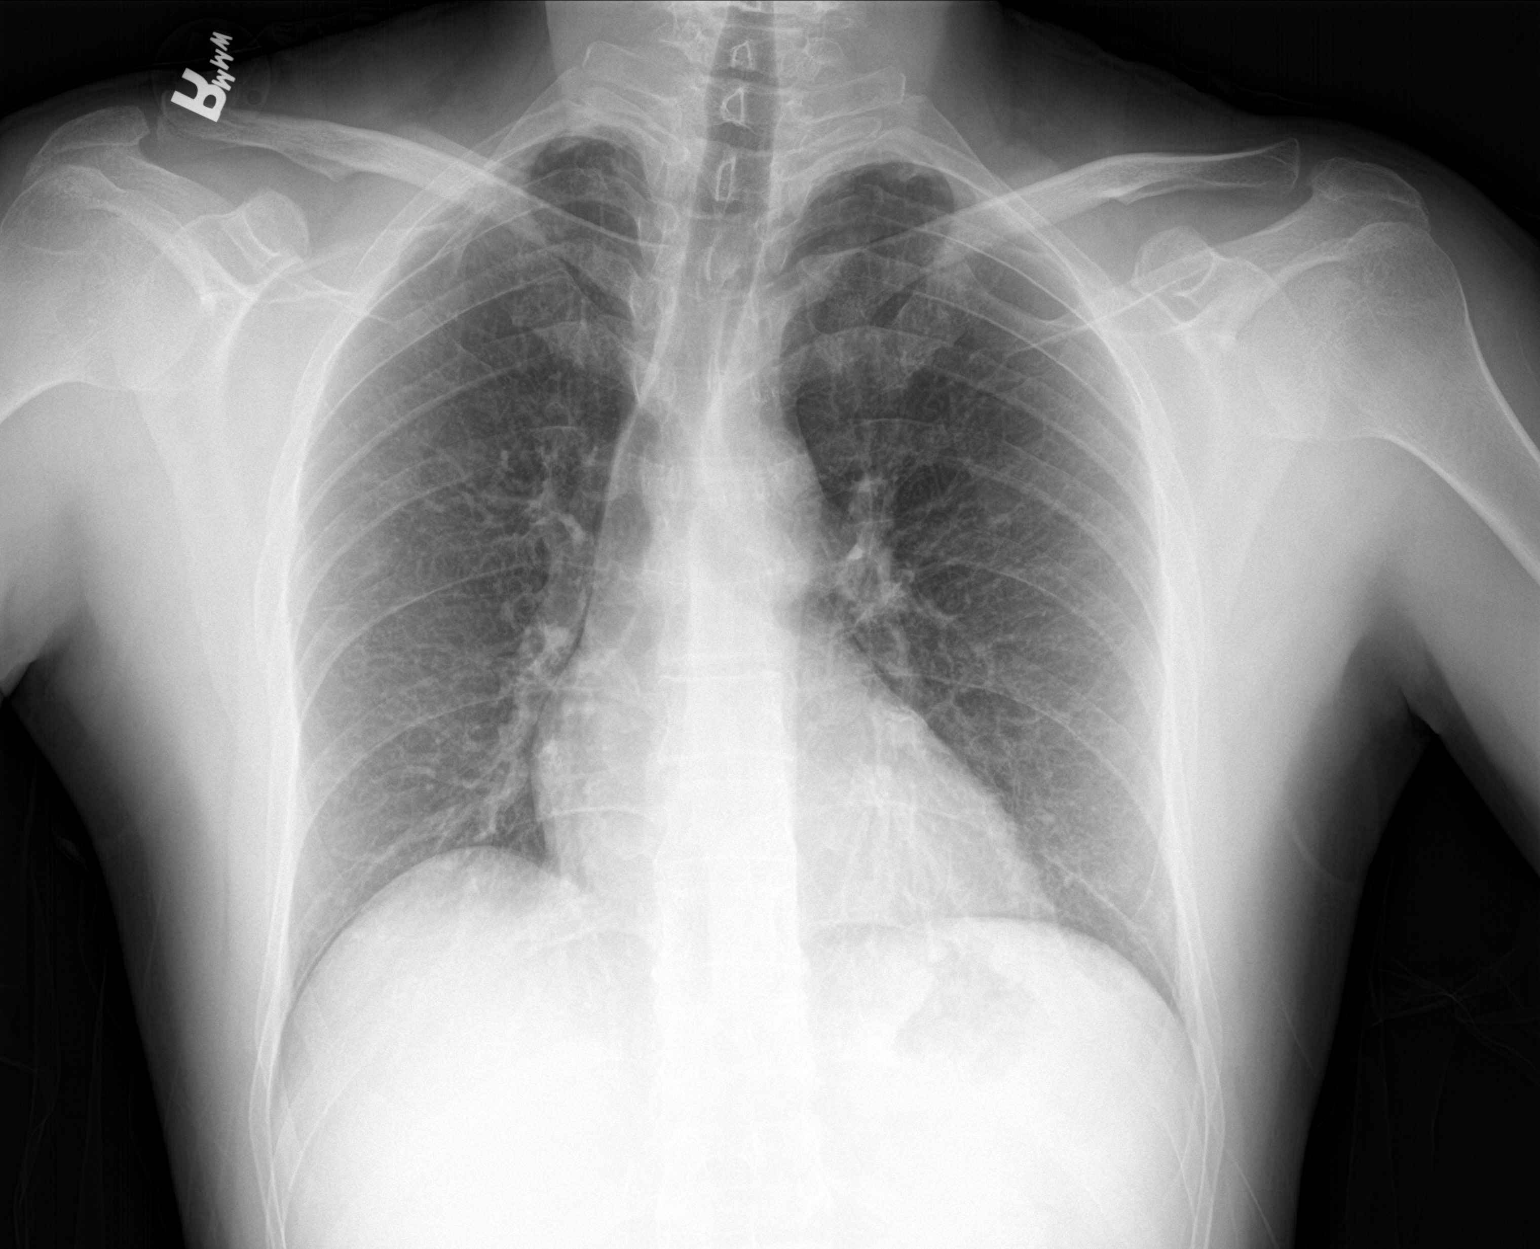

[chest lat]
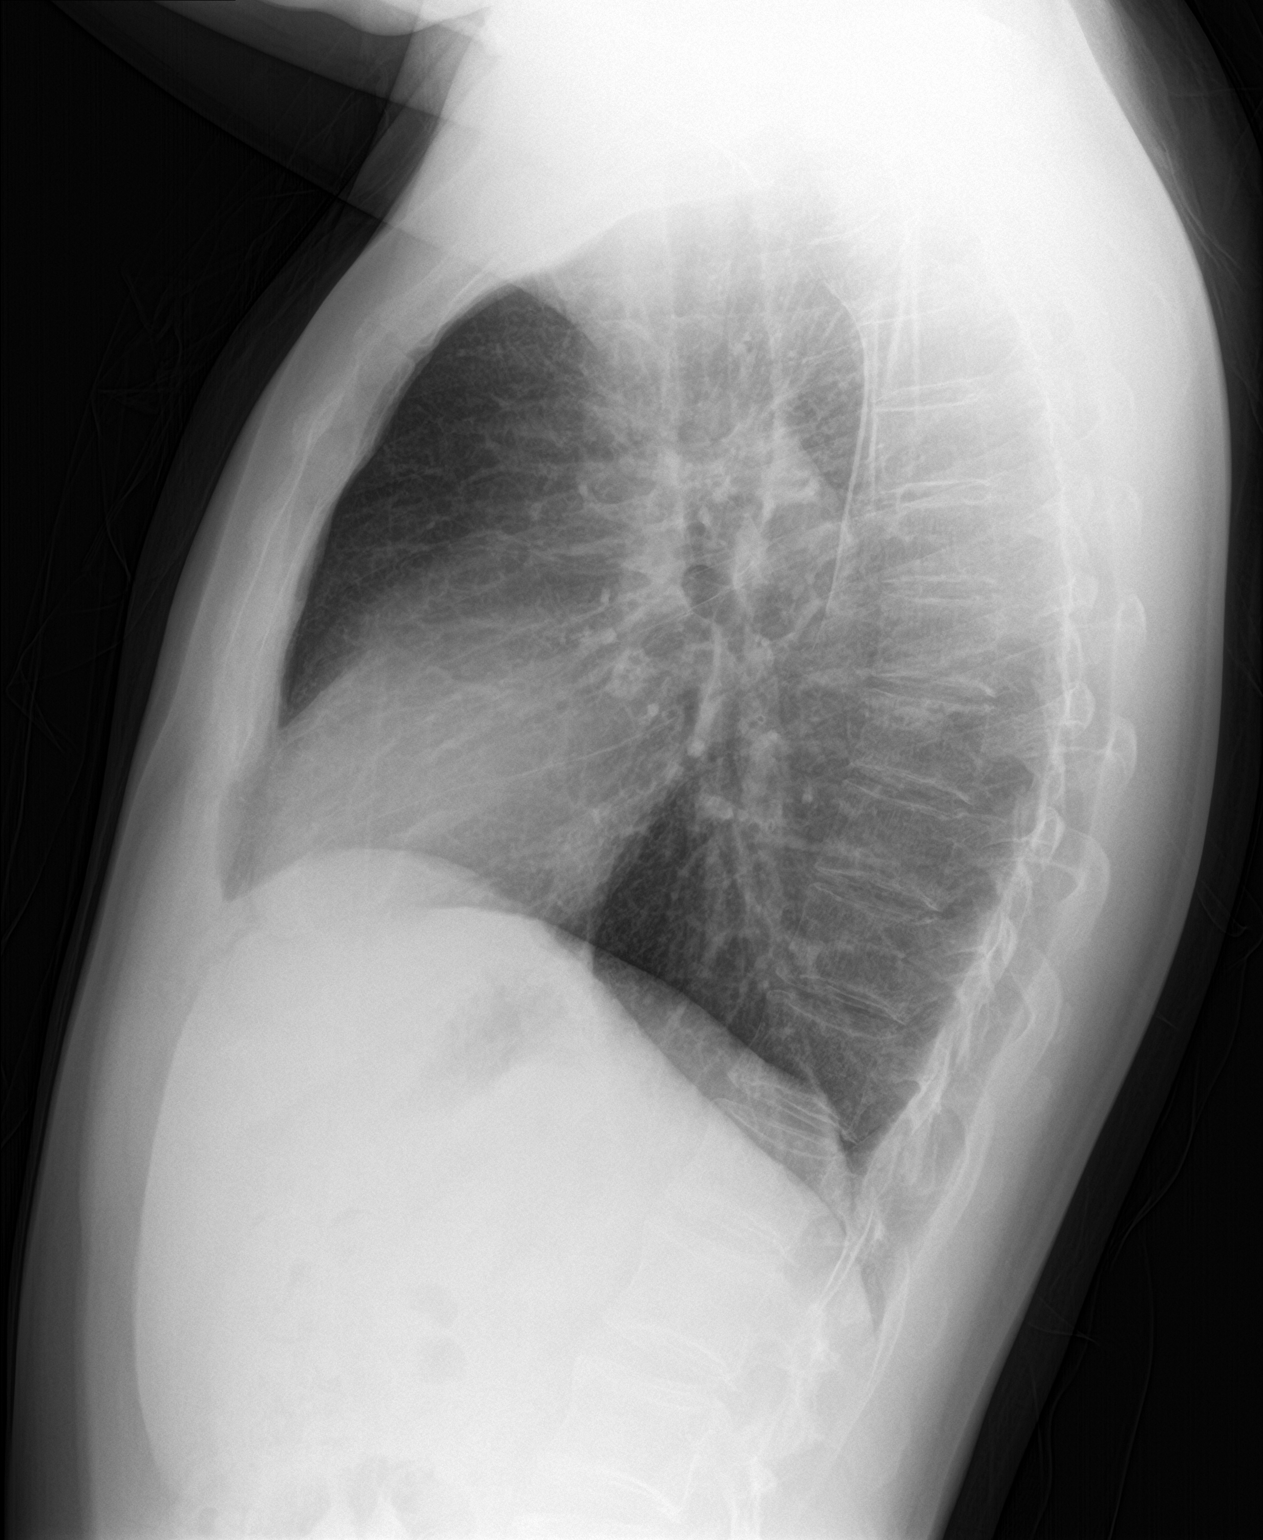

[2 of 2 positions shown; findings below may reference images not displayed]

FINDINGS: Mild biapical scarring. No focal consolidation. No pleural effusion
or pneumothorax. Heart and mediastinal contours are unremarkable.

No acute osseous abnormality.
IMPRESSION: No active cardiopulmonary disease.

## 2020-12-07 MED ORDER — FLUTICASONE PROPIONATE 50 MCG/ACT NA SUSP: 2.0000 | Freq: Every day | NASAL | 0 refills | Status: DC

## 2020-12-07 MED ORDER — AMOXICILLIN-POT CLAVULANATE 875-125 MG PO TABS: 1.0000 | ORAL_TABLET | Freq: Two times a day (BID) | ORAL | 0 refills | Status: DC

## 2020-12-07 MED ORDER — ALBUTEROL SULFATE HFA 108 (90 BASE) MCG/ACT IN AERS: 1.0000 | INHALATION_SPRAY | Freq: Four times a day (QID) | RESPIRATORY_TRACT | 0 refills | Status: DC | PRN

## 2020-12-07 MED ORDER — NAPROXEN 500 MG PO TABS: 500.0000 mg | ORAL_TABLET | Freq: Two times a day (BID) | ORAL | 0 refills | Status: DC | PRN

## 2020-12-07 MED ORDER — BENZONATATE 100 MG PO CAPS: 100.0000 mg | ORAL_CAPSULE | Freq: Three times a day (TID) | ORAL | 0 refills | Status: DC

## 2020-12-07 NOTE — ED Triage Notes (Signed)
Pt c/o cough, sore throat, congestion, body aches causing trouble standing up due to pain. Symptom onset 2 days ago.

## 2020-12-07 NOTE — ED Provider Notes (Signed)
[facility]    CSN: [id number] Arrival date & time: 12/07/20  1353      History   Chief Complaint Chief Complaint  Patient presents with   Sore Throat   Cough   Nasal Congestion   Generalized Body Aches    HPI Aaron Reyes is a 45 y.o. male.  Patient presents with medical interpretor.   HPI  Cold Symptoms: Patient states that for the past 2 days he has had cough, wheezing, sore throat, nasal congestion, body aches, dental pain.  He thinks he had a fever the first day of illness but this has stopped.  He has not tried anything for symptoms.  He reports that he is very concerned about his overall pain and wants to ensure that he is going to be treated appropriately. No chest pains, SOB, wheezing.    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   Testicular mass 03/18/2015    Patient Active Problem List   Diagnosis Date Noted   Weakness 11/15/2020   Neuropathy 09/22/2020   Patient takes NSAID (non-steroid anti-inflammatory drug) 09/22/2020   Hyperlipidemia associated with type 2 diabetes mellitus (HCC) 09/22/2020   Encounter for review of form with patient 05/27/2020   Major depressive disorder, recurrent episode with anxious distress (HCC) 03/16/2020   Allergic rhinitis 12/15/2019   Diabetes mellitus  without complication (HCC) 07/13/2019   Nonintractable headache 01/02/2019   Language barrier 01/02/2019   Tobacco abuse 01/01/2018   Dizziness 01/01/2018   Chronic pain of both knees 02/21/2017   Bilateral sensorineural hearing loss 02/07/2017   GERD (gastroesophageal reflux disease) 09/04/2016   Generalized anxiety disorder 01/26/2016   Post traumatic stress disorder 01/26/2016    Past Surgical History:  Procedure Laterality Date   CARDIAC CATHETERIZATION     CARDIAC CATHETERIZATION     x 2   CARDIAC CATHETERIZATION N/A 04/11/2015   Procedure: Left Heart Cath and Coronary Angiography;  Surgeon: Lyn Records, MD;  Location: [facility] INVASIVE CV LAB;  Service: Cardiovascular;  Laterality: N/A;   HERNIA REPAIR         Home Medications    Prior to Admission medications   Medication Sig Start Date End Date Taking? Authorizing Provider  acetaminophen (TYLENOL) 500 MG tablet Take 1,000 mg by mouth every 6 (six) hours as needed for moderate pain or headache.   Yes [provider]  aspirin 81 MG chewable tablet Chew 1 tablet (81 mg total) by mouth daily. 07/07/20  Yes Sandre Kitty, MD  busPIRone (BUSPAR) 10 MG tablet Take 1/2 tablet (5 mg total) by mouth 2 (two) times daily. 11/16/20  Yes Maury Dus, MD  escitalopram (LEXAPRO) 10 MG tablet Take 1 tablet (10 mg total) by mouth daily. 09/08/20  Yes Zena Amos, MD  gabapentin (NEURONTIN) 300  MG capsule Take 1 capsule (300 mg total) by mouth 3 (three) times daily. Take 300 mg at bedtime for the first three days. If you continue to have pain, increase to 600 mg. After three days, you can increase to 900 mg at bedtime. Patient taking differently: Take 900 mg by mouth at bedtime. 09/21/20  Yes Melene Plan, MD  LORazepam (ATIVAN) 0.5 MG tablet Take 1 tablet (0.5 mg total) by mouth 2 (two) times daily as needed for anxiety. 09/08/20  Yes Zena Amos, MD  metFORMIN (GLUCOPHAGE-XR) 500 MG 24 hr tablet Take 2 tablets (1,000 mg total) by  mouth daily with breakfast. 07/07/20  Yes Sandre Kitty, MD  Multiple Vitamin (MULTIVITAMIN ADULT) TABS Take 1 tablet by mouth daily. 03/04/20  Yes Melene Plan, MD  pantoprazole (PROTONIX) 40 MG tablet Take 1 tablet (40 mg total) by mouth 2 (two) times daily. 07/07/20  Yes Sandre Kitty, MD  rosuvastatin (CRESTOR) 20 MG tablet Take 1 tablet (20 mg total) by mouth daily. 07/07/20  Yes Sandre Kitty, MD  traZODone (DESYREL) 100 MG tablet Take 1 tablet (100 mg total) by mouth at bedtime. 09/08/20  Yes Zena Amos, MD    Family History Family History  Problem Relation Age of Onset   CAD Father    Heart attack Father    Hypertension Father    Stroke Father    Cancer Mother    Diabetes Mother    Hypertension Mother     Social History Social History   Tobacco Use   Smoking status: Every Day    Packs/day: 0.50    Pack years: 0.00    Types: Cigarettes   Smokeless tobacco: Never  Substance Use Topics   Alcohol use: No   Drug use: No     Allergies   Pork-derived products   Review of Systems Review of Systems  As stated above in HPI Physical Exam Triage Vital Signs ED Triage Vitals  Enc Vitals Group     BP 12/07/20 1457 121/81     Pulse Rate 12/07/20 1457 67     Resp 12/07/20 1457 17     Temp 12/07/20 1457 98.2 F (36.8 C)     Temp Source 12/07/20 1457 Oral     SpO2 12/07/20 1457 97 %     Weight --      Height --      Head Circumference --      Peak Flow --      Pain Score 12/07/20 1455 9     Pain Loc --      Pain Edu? --      Excl. in GC? --    No data found.  Updated Vital Signs BP 121/81 (BP Location: Left Arm)   Pulse 67   Temp 98.2 F (36.8 C) (Oral)   Resp 17   SpO2 97%   Physical Exam Vitals and nursing note reviewed.  Constitutional:      General: He is not in acute distress.    Appearance: He is well-developed. He is not ill-appearing, toxic-appearing or diaphoretic.  HENT:     Head: Normocephalic and atraumatic.     Right Ear: Tympanic  membrane and ear canal normal. No middle ear effusion. Tympanic membrane is not erythematous.     Left Ear: Tympanic membrane and ear canal normal.  No middle ear effusion. Tympanic membrane is not erythematous.     Nose: Congestion and rhinorrhea present.     Mouth/Throat:  Mouth: Mucous membranes are moist.     Pharynx: Oropharynx is clear. Uvula midline. No oropharyngeal exudate, posterior oropharyngeal erythema or uvula swelling.     Tonsils: No tonsillar exudate or tonsillar abscesses.   Eyes:     Conjunctiva/sclera: Conjunctivae normal.     Pupils: Pupils are equal, round, and reactive to light.  Cardiovascular:     Rate and Rhythm: Normal rate and regular rhythm.     Heart sounds: Normal heart sounds.  Pulmonary:     Effort: Pulmonary effort is normal.     Breath sounds: Wheezing and rhonchi present.  Skin:    General: Skin is warm.  Neurological:     Mental Status: He is alert and oriented to person, place, and time.     UC Treatments / Results  Labs (all labs ordered are listed, but only abnormal results are displayed) Labs Reviewed - No data to display  EKG   Radiology No results found.  Procedures Procedures (including critical care time)  Medications Ordered in UC Medications - No data to display  Initial Impression / Assessment and Plan / UC Course  I have reviewed the triage vital signs and the nursing notes.  Pertinent labs & imaging results that were available during my care of the patient were reviewed by me and considered in my medical decision making (see chart for details).     New. Chest x ray pending. COVID-19 testing pending.   Update: Chest x-ray is normal.  Sending in medication for his cold symptoms as well as his dental infection to prevent further systemic complication.  Discussed red flag signs and symptoms.  Hydration and rest encouraged. Final Clinical Impressions(s) / UC Diagnoses   Final diagnoses:  None   Discharge  Instructions   None    ED Prescriptions   None    PDMP not reviewed this encounter.   Rushie Chestnut, [place] 12/07/20 2059

## 2020-12-27 ENCOUNTER — Telehealth: Payer: Self-pay

## 2020-12-27 NOTE — Telephone Encounter (Signed)
Have called pt several times to inform him of his appt with ENT on 7/18. (Please see referral.) I am mailing pt an appt card with the appt info. (Place,day,date,time and phone number.) If pt calls, please let him know if the appt and if needed get a time I can call him back with an interpreter. Sunday Spillers, CMA

## 2021-01-06 ENCOUNTER — Encounter (HOSPITAL_COMMUNITY): Payer: Self-pay

## 2021-01-06 ENCOUNTER — Ambulatory Visit (HOSPITAL_COMMUNITY)
Admission: EM | Admit: 2021-01-06 | Discharge: 2021-01-06 | Disposition: A | Discharge status: Home / Self Care | Payer: Medicaid Other | Attending: Internal Medicine | Admitting: Internal Medicine

## 2021-01-06 ENCOUNTER — Other Ambulatory Visit: Payer: Self-pay

## 2021-01-06 DIAGNOSIS — K047 Periapical abscess without sinus: Secondary | ICD-10-CM | POA: Diagnosis not present

## 2021-01-06 MED ORDER — IBUPROFEN 600 MG PO TABS: 600.0000 mg | ORAL_TABLET | Freq: Four times a day (QID) | ORAL | 0 refills | Status: DC | PRN

## 2021-01-06 MED ORDER — KETOROLAC TROMETHAMINE 60 MG/2ML IM SOLN
INTRAMUSCULAR | Status: AC
  Filled 2021-01-06: qty 2

## 2021-01-06 MED ORDER — AMOXICILLIN-POT CLAVULANATE 875-125 MG PO TABS: 1.0000 | ORAL_TABLET | Freq: Two times a day (BID) | ORAL | 0 refills | Status: AC

## 2021-01-06 MED ORDER — KETOROLAC TROMETHAMINE 60 MG/2ML IM SOLN
60.0000 mg | Freq: Once | INTRAMUSCULAR | Status: AC
  Administered 2021-01-06: 60 mg via INTRAMUSCULAR

## 2021-01-06 MED ORDER — LIDOCAINE VISCOUS HCL 2 % MT SOLN: 15.0000 mL | OROMUCOSAL | 0 refills | Status: DC | PRN

## 2021-01-06 NOTE — Discharge Instructions (Addendum)
Medications as prescribed Please follow-up with a dentist for dental work Return to urgent care if symptoms worsen.

## 2021-01-06 NOTE — ED Triage Notes (Addendum)
Tooth pain to right side of mouth to head Started: unsure of when pain started, not sure how he got to urgent care Interventions: (250mg  per patient) Motrin- not helpful

## 2021-01-09 NOTE — ED Provider Notes (Addendum)
[facility]    CSN: [id number] Arrival date & time: 01/06/21  1001      History   Chief Complaint Chief Complaint  Patient presents with   Dental Pain    HPI Aaron Reyes is a 45 y.o. male comes to the urgent care with right-sided dental pain which started a few hours ago.  Pain is severe, currently 10 out of 10, throbbing, has not tried any over-the-counter medication and denies any radiation of the pain.  No trauma to the face.  Patient has poor dentition.  No jaw swelling.  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   Testicular mass 03/18/2015    Patient Active Problem List   Diagnosis Date Noted   Weakness 11/15/2020   Neuropathy 09/22/2020   Patient takes NSAID (non-steroid anti-inflammatory drug) 09/22/2020   Hyperlipidemia associated with type 2 diabetes mellitus (HCC) 09/22/2020   Encounter for review of form with patient 05/27/2020   Major depressive disorder, recurrent episode with anxious distress (HCC) 03/16/2020   Allergic rhinitis 12/15/2019   Diabetes mellitus without complication (HCC) 07/13/2019   Nonintractable headache 01/02/2019   Language barrier 01/02/2019   Tobacco abuse 01/01/2018   Dizziness 01/01/2018   Chronic pain of both knees 02/21/2017    Bilateral sensorineural hearing loss 02/07/2017   GERD (gastroesophageal reflux disease) 09/04/2016   Generalized anxiety disorder 01/26/2016   Post traumatic stress disorder 01/26/2016    Past Surgical History:  Procedure Laterality Date   CARDIAC CATHETERIZATION     CARDIAC CATHETERIZATION     x 2   CARDIAC CATHETERIZATION N/A 04/11/2015   Procedure: Left Heart Cath and Coronary Angiography;  Surgeon: Lyn Records, MD;  Location: [facility] INVASIVE CV LAB;  Service: Cardiovascular;  Laterality: N/A;   HERNIA REPAIR         Home Medications    Prior to Admission medications   Medication Sig Start Date End Date Taking? Authorizing Provider  ibuprofen (ADVIL) 600 MG tablet Take 1 tablet (600 mg total) by mouth every 6 (six) hours as needed. 01/06/21  Yes Carliss Quast, Britta Mccreedy, MD  lidocaine (XYLOCAINE) 2 % solution Use as directed 15 mLs in the mouth or throat as needed for mouth pain. 01/06/21  Yes Modell Fendrick, Britta Mccreedy, MD  acetaminophen (TYLENOL) 500 MG tablet Take 1,000 mg by mouth every 6 (six) hours as needed for moderate pain or headache.    [provider]  albuterol (VENTOLIN HFA) 108 (90 Base) MCG/ACT inhaler Inhale 1-2 puffs into the lungs every 6 (six) hours as needed for wheezing or shortness of breath. 12/07/20   Rushie Chestnut, PA-C  amoxicillin-clavulanate (AUGMENTIN) 875-125 MG tablet Take 1 tablet by mouth every 12 (twelve) hours for 7 days. 01/06/21 01/13/21  Merrilee Jansky, MD  aspirin 81 MG chewable tablet Chew 1 tablet (81 mg total) by mouth daily. 07/07/20   Sandre Kitty, MD  benzonatate (TESSALON) 100 MG capsule Take 1 capsule (100 mg total) by mouth every 8 (eight) hours. 12/07/20   Rushie Chestnut, PA-C  busPIRone (BUSPAR) 10 MG tablet Take 1/2 tablet (5 mg total) by mouth 2 (two) times daily. 11/16/20   Maury Dus, MD  escitalopram (LEXAPRO) 10 MG tablet Take 1 tablet (10 mg total) by mouth daily. 09/08/20   Zena Amos, MD  fluticasone (FLONASE) 50  MCG/ACT nasal spray Place 2 sprays into both nostrils daily. 12/07/20   Rushie Chestnut, PA-C  gabapentin (NEURONTIN) 300 MG capsule Take 1 capsule (300 mg total) by mouth 3 (three) times daily. Take 300 mg at bedtime for the first three days. If you continue to have pain, increase to 600 mg. After three days, you can increase to 900 mg at bedtime. Patient taking differently: Take 900 mg by mouth at bedtime. 09/21/20   Melene Plan, MD  LORazepam (ATIVAN) 0.5 MG tablet Take 1 tablet (0.5 mg total) by mouth 2 (two) times daily as needed for anxiety. 09/08/20   Zena Amos, MD  metFORMIN (GLUCOPHAGE-XR) 500 MG 24 hr tablet Take 2 tablets (1,000 mg total) by mouth daily with breakfast. 07/07/20   Sandre Kitty, MD  Multiple Vitamin (MULTIVITAMIN ADULT) TABS Take 1 tablet by mouth daily. 03/04/20   Melene Plan, MD  pantoprazole (PROTONIX) 40 MG tablet Take 1 tablet (40 mg total) by mouth 2 (two) times daily. 07/07/20   Sandre Kitty, MD  rosuvastatin (CRESTOR) 20 MG tablet Take 1 tablet (20 mg total) by mouth daily. 07/07/20   Sandre Kitty, MD  traZODone (DESYREL) 100 MG tablet Take 1 tablet (100 mg total) by mouth at bedtime. 09/08/20   Zena Amos, MD    Family History Family History  Problem Relation Age of Onset   CAD Father    Heart attack Father    Hypertension Father    Stroke Father    Cancer Mother    Diabetes Mother    Hypertension Mother     Social History Social History   Tobacco Use   Smoking status: Every Day    Packs/day: 0.50    Types: Cigarettes   Smokeless tobacco: Never  Substance Use Topics   Alcohol use: No   Drug use: No     Allergies   Pork-derived products   Review of Systems Review of Systems  HENT:  Positive for dental problem. Negative for sore throat.   Respiratory: Negative.    Gastrointestinal: Negative.   Neurological:  Positive for headaches. Negative for dizziness and light-headedness.    Physical Exam Triage Vital Signs ED  Triage Vitals  Enc Vitals Group     BP 01/06/21 1039 106/80     Pulse Rate 01/06/21 1039 72     Resp 01/06/21 1039 (!) 22     Temp 01/06/21 1039 98.2 F (36.8 C)     Temp Source 01/06/21 1039 Oral     SpO2 01/06/21 1039 100 %     Weight --      Height --      Head Circumference --      Peak Flow --      Pain Score 01/06/21 1035 10     Pain Loc --      Pain Edu? --  Excl. in GC? --    No data found.  Updated Vital Signs BP 106/80 (BP Location: Right Arm)   Pulse 72   Temp 98.2 F (36.8 C) (Oral)   Resp (!) 22   SpO2 100%   Visual Acuity Right Eye Distance:   Left Eye Distance:   Bilateral Distance:    Right Eye Near:   Left Eye Near:    Bilateral Near:     Physical Exam Vitals and nursing note reviewed.  Constitutional:      General: He is in acute distress.     Appearance: He is not ill-appearing.  HENT:     Right Ear: Tympanic membrane normal.     Left Ear: Tympanic membrane normal.     Mouth/Throat:     Comments: Dental cavity in the right second maxillary premolar.  No gum swelling or discharge. Cardiovascular:     Rate and Rhythm: Normal rate and regular rhythm.  Neurological:     Mental Status: He is alert.     UC Treatments / Results  Labs (all labs ordered are listed, but only abnormal results are displayed) Labs Reviewed - No data to display  EKG   Radiology No results found.  Procedures Procedures (including critical care time)  Medications Ordered in UC Medications  ketorolac (TORADOL) injection 60 mg (60 mg Intramuscular Given 01/06/21 1058)    Initial Impression / Assessment and Plan / UC Course  I have reviewed the triage vital signs and the nursing notes.  Pertinent labs & imaging results that were available during my care of the patient were reviewed by me and considered in my medical decision making (see chart for details).     1.  Dental infection: Augmentin 875-125 mg twice daily for 7 days Ibuprofen 600 mg every 6  hours as needed for pain Viscous lidocaine as needed for pain Patient is advised to seek dental care. Return to urgent care if symptoms persist or worsen. Final Clinical Impressions(s) / UC Diagnoses   Final diagnoses:  Dental infection     Discharge Instructions      Medications as prescribed Please follow-up with a dentist for dental work Return to urgent care if symptoms worsen.   ED Prescriptions     Medication Sig Dispense Auth. Provider   amoxicillin-clavulanate (AUGMENTIN) 875-125 MG tablet Take 1 tablet by mouth every 12 (twelve) hours for 7 days. 14 tablet Tyquarius Paglia, Britta Mccreedy, MD   ibuprofen (ADVIL) 600 MG tablet Take 1 tablet (600 mg total) by mouth every 6 (six) hours as needed. 30 tablet Ethelene Closser, Britta Mccreedy, MD   lidocaine (XYLOCAINE) 2 % solution Use as directed 15 mLs in the mouth or throat as needed for mouth pain. 100 mL Hilliard Borges, Britta Mccreedy, MD      PDMP not reviewed this encounter.   Merrilee Jansky, MD 01/09/21 1742    Merrilee Jansky, MD 01/09/21 [phone]

## 2021-01-30 ENCOUNTER — Emergency Department (HOSPITAL_COMMUNITY): Payer: Medicaid Other

## 2021-01-30 ENCOUNTER — Inpatient Hospital Stay (HOSPITAL_COMMUNITY)
Admission: EM | Admit: 2021-01-30 | Discharge: 2021-02-08 | DRG: 605 | Disposition: A | Discharge status: Home / Self Care | Payer: Medicaid Other | Attending: Family Medicine | Admitting: Family Medicine

## 2021-01-30 ENCOUNTER — Encounter (HOSPITAL_COMMUNITY): Payer: Self-pay | Admitting: Emergency Medicine

## 2021-01-30 ENCOUNTER — Other Ambulatory Visit: Payer: Self-pay

## 2021-01-30 DIAGNOSIS — Z79899 Other long term (current) drug therapy: Secondary | ICD-10-CM

## 2021-01-30 DIAGNOSIS — Z7984 Long term (current) use of oral hypoglycemic drugs: Secondary | ICD-10-CM

## 2021-01-30 DIAGNOSIS — Z833 Family history of diabetes mellitus: Secondary | ICD-10-CM

## 2021-01-30 DIAGNOSIS — S20212A Contusion of left front wall of thorax, initial encounter: Secondary | ICD-10-CM | POA: Diagnosis present

## 2021-01-30 DIAGNOSIS — I517 Cardiomegaly: Secondary | ICD-10-CM | POA: Diagnosis present

## 2021-01-30 DIAGNOSIS — F430 Acute stress reaction: Secondary | ICD-10-CM | POA: Diagnosis present

## 2021-01-30 DIAGNOSIS — Z20822 Contact with and (suspected) exposure to covid-19: Secondary | ICD-10-CM | POA: Diagnosis present

## 2021-01-30 DIAGNOSIS — F411 Generalized anxiety disorder: Secondary | ICD-10-CM | POA: Diagnosis present

## 2021-01-30 DIAGNOSIS — S060X9A Concussion with loss of consciousness of unspecified duration, initial encounter: Secondary | ICD-10-CM | POA: Diagnosis present

## 2021-01-30 DIAGNOSIS — H9313 Tinnitus, bilateral: Secondary | ICD-10-CM | POA: Diagnosis present

## 2021-01-30 DIAGNOSIS — R079 Chest pain, unspecified: Secondary | ICD-10-CM | POA: Diagnosis present

## 2021-01-30 DIAGNOSIS — K219 Gastro-esophageal reflux disease without esophagitis: Secondary | ICD-10-CM | POA: Diagnosis present

## 2021-01-30 DIAGNOSIS — I252 Old myocardial infarction: Secondary | ICD-10-CM

## 2021-01-30 DIAGNOSIS — H903 Sensorineural hearing loss, bilateral: Secondary | ICD-10-CM | POA: Diagnosis present

## 2021-01-30 DIAGNOSIS — E1169 Type 2 diabetes mellitus with other specified complication: Secondary | ICD-10-CM | POA: Diagnosis present

## 2021-01-30 DIAGNOSIS — F4310 Post-traumatic stress disorder, unspecified: Secondary | ICD-10-CM

## 2021-01-30 DIAGNOSIS — G8929 Other chronic pain: Secondary | ICD-10-CM | POA: Diagnosis present

## 2021-01-30 DIAGNOSIS — E781 Pure hyperglyceridemia: Secondary | ICD-10-CM | POA: Diagnosis present

## 2021-01-30 DIAGNOSIS — F17210 Nicotine dependence, cigarettes, uncomplicated: Secondary | ICD-10-CM | POA: Diagnosis present

## 2021-01-30 DIAGNOSIS — E1140 Type 2 diabetes mellitus with diabetic neuropathy, unspecified: Secondary | ICD-10-CM | POA: Diagnosis present

## 2021-01-30 DIAGNOSIS — E7800 Pure hypercholesterolemia, unspecified: Secondary | ICD-10-CM | POA: Diagnosis present

## 2021-01-30 DIAGNOSIS — Z7982 Long term (current) use of aspirin: Secondary | ICD-10-CM

## 2021-01-30 DIAGNOSIS — F4312 Post-traumatic stress disorder, chronic: Secondary | ICD-10-CM | POA: Diagnosis present

## 2021-01-30 DIAGNOSIS — F329 Major depressive disorder, single episode, unspecified: Secondary | ICD-10-CM | POA: Diagnosis present

## 2021-01-30 DIAGNOSIS — R778 Other specified abnormalities of plasma proteins: Secondary | ICD-10-CM | POA: Diagnosis present

## 2021-01-30 DIAGNOSIS — F419 Anxiety disorder, unspecified: Secondary | ICD-10-CM

## 2021-01-30 DIAGNOSIS — Z91014 Allergy to mammalian meats: Secondary | ICD-10-CM

## 2021-01-30 DIAGNOSIS — R42 Dizziness and giddiness: Secondary | ICD-10-CM

## 2021-01-30 DIAGNOSIS — Z8249 Family history of ischemic heart disease and other diseases of the circulatory system: Secondary | ICD-10-CM

## 2021-01-30 LAB — CBC
HCT: 46.2 % (ref 39.0–52.0)
Hemoglobin: 16 g/dL (ref 13.0–17.0)
MCH: 29.4 pg (ref 26.0–34.0)
MCHC: 34.6 g/dL (ref 30.0–36.0)
MCV: 84.9 fL (ref 80.0–100.0)
Platelets: 338 10*3/uL (ref 150–400)
RBC: 5.44 MIL/uL (ref 4.22–5.81)
RDW: 12.4 % (ref 11.5–15.5)
WBC: 10.8 10*3/uL — ABNORMAL HIGH (ref 4.0–10.5)
nRBC: 0 % (ref 0.0–0.2)
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
## 2021-01-30 MED ORDER — ACETAMINOPHEN 325 MG PO TABS
650.0000 mg | ORAL_TABLET | Freq: Once | ORAL | Status: AC
  Administered 2021-01-30: 650 mg via ORAL
  Filled 2021-01-30: qty 2
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
## 2021-01-31 ENCOUNTER — Emergency Department (HOSPITAL_COMMUNITY): Payer: Medicaid Other

## 2021-01-31 ENCOUNTER — Observation Stay (HOSPITAL_BASED_OUTPATIENT_CLINIC_OR_DEPARTMENT_OTHER): Payer: Medicaid Other

## 2021-01-31 DIAGNOSIS — F430 Acute stress reaction: Secondary | ICD-10-CM | POA: Diagnosis present

## 2021-01-31 DIAGNOSIS — R079 Chest pain, unspecified: Secondary | ICD-10-CM

## 2021-01-31 DIAGNOSIS — S20212A Contusion of left front wall of thorax, initial encounter: Secondary | ICD-10-CM | POA: Diagnosis not present

## 2021-01-31 DIAGNOSIS — R42 Dizziness and giddiness: Secondary | ICD-10-CM | POA: Diagnosis not present

## 2021-01-31 DIAGNOSIS — R778 Other specified abnormalities of plasma proteins: Secondary | ICD-10-CM | POA: Insufficient documentation

## 2021-01-31 DIAGNOSIS — S060X9A Concussion with loss of consciousness of unspecified duration, initial encounter: Secondary | ICD-10-CM | POA: Diagnosis present

## 2021-01-31 LAB — COMPREHENSIVE METABOLIC PANEL
ALT: 22 U/L (ref 0–44)
AST: 22 U/L (ref 15–41)
Albumin: 3.9 g/dL (ref 3.5–5.0)
Alkaline Phosphatase: 70 U/L (ref 38–126)
Anion gap: 12 (ref 5–15)
BUN: 19 mg/dL (ref 6–20)
CO2: 22 mmol/L (ref 22–32)
Calcium: 9.5 mg/dL (ref 8.9–10.3)
Chloride: 103 mmol/L (ref 98–111)
Creatinine, Ser: 0.94 mg/dL (ref 0.61–1.24)
GFR, Estimated: >60 mL/min (ref >60)
Glucose, Bld: 200 mg/dL — ABNORMAL HIGH (ref 70–99)
Potassium: 3.7 mmol/L (ref 3.5–5.1)
Sodium: 137 mmol/L (ref 135–145)
Total Bilirubin: 0.4 mg/dL (ref 0.3–1.2)
Total Protein: 7.3 g/dL (ref 6.5–8.1)

## 2021-01-31 LAB — ECHOCARDIOGRAM COMPLETE
AR max vel: 2.31 cm2
AV Area VTI: 2.18 cm2
AV Area mean vel: 2.19 cm2
AV Mean grad: 2 mmHg
AV Peak grad: 3.8 mmHg
Ao pk vel: 0.97 m/s
Area-P 1/2: 3.39 cm2
Height: 65 in
MV VTI: 2.22 cm2
S' Lateral: 2.4 cm
Weight: 2243.4 oz

## 2021-01-31 LAB — TROPONIN I (HIGH SENSITIVITY)
Troponin I (High Sensitivity): 16 ng/L (ref <18)
Troponin I (High Sensitivity): 22 ng/L — ABNORMAL HIGH (ref <18)
Troponin I (High Sensitivity): 31 ng/L — ABNORMAL HIGH (ref <18)
Troponin I (High Sensitivity): 33 ng/L — ABNORMAL HIGH (ref <18)
Troponin I (High Sensitivity): 38 ng/L — ABNORMAL HIGH (ref <18)

## 2021-01-31 LAB — RESP PANEL BY RT-PCR (FLU A&B, COVID) ARPGX2
Influenza A by PCR: NEGATIVE
Influenza B by PCR: NEGATIVE
SARS Coronavirus 2 by RT PCR: NEGATIVE

## 2021-01-31 LAB — RAPID URINE DRUG SCREEN, HOSP PERFORMED
Amphetamines: NOT DETECTED
Barbiturates: NOT DETECTED
Benzodiazepines: NOT DETECTED
Cocaine: NOT DETECTED
Opiates: POSITIVE — AB
Tetrahydrocannabinol: NOT DETECTED

## 2021-01-31 LAB — GLUCOSE, CAPILLARY: Glucose-Capillary: 164 mg/dL — ABNORMAL HIGH (ref 70–99)

## 2021-01-31 LAB — CBG MONITORING, ED
Glucose-Capillary: 115 mg/dL — ABNORMAL HIGH (ref 70–99)
Glucose-Capillary: 157 mg/dL — ABNORMAL HIGH (ref 70–99)

## 2021-01-31 LAB — HEMOGLOBIN A1C
Hgb A1c MFr Bld: 6.5 % — ABNORMAL HIGH (ref 4.8–5.6)
Mean Plasma Glucose: 139.85 mg/dL
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
## 2021-01-31 MED ORDER — ROSUVASTATIN CALCIUM 20 MG PO TABS
20.0000 mg | ORAL_TABLET | Freq: Every day | ORAL | Status: DC
  Administered 2021-01-31 – 2021-02-08 (×9): 20 mg via ORAL
  Filled 2021-01-31 (×9): qty 1

## 2021-01-31 MED ORDER — BUSPIRONE HCL 10 MG PO TABS: 5.0000 mg | ORAL_TABLET | Freq: Two times a day (BID) | ORAL | Status: DC

## 2021-01-31 MED ORDER — NICOTINE 7 MG/24HR TD PT24
7.0000 mg | MEDICATED_PATCH | Freq: Every day | TRANSDERMAL | Status: DC
  Administered 2021-02-01 – 2021-02-08 (×8): 7 mg via TRANSDERMAL
  Filled 2021-01-31 (×8): qty 1

## 2021-01-31 MED ORDER — ASPIRIN 81 MG PO CHEW
81.0000 mg | CHEWABLE_TABLET | Freq: Every day | ORAL | Status: DC
  Administered 2021-01-31 – 2021-02-03 (×4): 81 mg via ORAL
  Filled 2021-01-31 (×5): qty 1

## 2021-01-31 MED ORDER — KETOROLAC TROMETHAMINE 15 MG/ML IJ SOLN
15.0000 mg | Freq: Four times a day (QID) | INTRAMUSCULAR | Status: DC | PRN
  Administered 2021-01-31 – 2021-02-02 (×4): 15 mg via INTRAVENOUS
  Filled 2021-01-31 (×4): qty 1

## 2021-01-31 MED ORDER — ONDANSETRON HCL 4 MG/2ML IJ SOLN: 4.0000 mg | Freq: Four times a day (QID) | INTRAMUSCULAR | Status: DC | PRN

## 2021-01-31 MED ORDER — GABAPENTIN 300 MG PO CAPS
900.0000 mg | ORAL_CAPSULE | Freq: Every day | ORAL | Status: DC
  Administered 2021-01-31 – 2021-02-01 (×2): 900 mg via ORAL
  Filled 2021-01-31 (×2): qty 3

## 2021-01-31 MED ORDER — METFORMIN HCL ER 500 MG PO TB24: 1000.0000 mg | ORAL_TABLET | Freq: Every day | ORAL | Status: DC

## 2021-01-31 MED ORDER — ONDANSETRON HCL 4 MG/2ML IJ SOLN
4.0000 mg | Freq: Once | INTRAMUSCULAR | Status: AC
  Administered 2021-01-31: 4 mg via INTRAVENOUS
  Filled 2021-01-31: qty 2

## 2021-01-31 MED ORDER — ACETAMINOPHEN 325 MG PO TABS
650.0000 mg | ORAL_TABLET | Freq: Four times a day (QID) | ORAL | Status: DC
  Administered 2021-01-31 – 2021-02-05 (×16): 650 mg via ORAL
  Filled 2021-01-31 (×16): qty 2

## 2021-01-31 MED ORDER — IOHEXOL 350 MG/ML SOLN
80.0000 mL | Freq: Once | INTRAVENOUS | Status: AC | PRN
  Administered 2021-01-31: 80 mL via INTRAVENOUS

## 2021-01-31 MED ORDER — TRAZODONE HCL 100 MG PO TABS
100.0000 mg | ORAL_TABLET | Freq: Every day | ORAL | Status: DC
  Administered 2021-01-31 – 2021-02-07 (×8): 100 mg via ORAL
  Filled 2021-01-31 (×8): qty 1

## 2021-01-31 MED ORDER — ACETAMINOPHEN 650 MG RE SUPP: 650.0000 mg | Freq: Four times a day (QID) | RECTAL | Status: DC | PRN

## 2021-01-31 MED ORDER — ONDANSETRON HCL 4 MG PO TABS: 4.0000 mg | ORAL_TABLET | Freq: Four times a day (QID) | ORAL | Status: DC | PRN

## 2021-01-31 MED ORDER — PANTOPRAZOLE SODIUM 40 MG PO TBEC
40.0000 mg | DELAYED_RELEASE_TABLET | Freq: Two times a day (BID) | ORAL | Status: DC
  Administered 2021-01-31 – 2021-02-08 (×17): 40 mg via ORAL
  Filled 2021-01-31 (×17): qty 1

## 2021-01-31 MED ORDER — ACETAMINOPHEN 325 MG PO TABS
650.0000 mg | ORAL_TABLET | Freq: Four times a day (QID) | ORAL | Status: DC | PRN
  Administered 2021-01-31: 650 mg via ORAL
  Filled 2021-01-31: qty 2

## 2021-01-31 MED ORDER — INSULIN ASPART 100 UNIT/ML IJ SOLN
0.0000 [IU] | Freq: Three times a day (TID) | INTRAMUSCULAR | Status: DC
  Administered 2021-01-31 – 2021-02-01 (×2): 2 [IU] via SUBCUTANEOUS

## 2021-01-31 MED ORDER — NICOTINE 14 MG/24HR TD PT24
14.0000 mg | MEDICATED_PATCH | Freq: Every day | TRANSDERMAL | Status: DC
  Administered 2021-01-31: 14 mg via TRANSDERMAL
  Filled 2021-01-31: qty 1

## 2021-01-31 MED ORDER — KETOROLAC TROMETHAMINE 15 MG/ML IJ SOLN
15.0000 mg | INTRAMUSCULAR | Status: AC
  Administered 2021-01-31: 15 mg via INTRAVENOUS
  Filled 2021-01-31: qty 1

## 2021-01-31 MED ORDER — ESCITALOPRAM OXALATE 10 MG PO TABS: 10.0000 mg | ORAL_TABLET | Freq: Every day | ORAL | Status: DC

## 2021-01-31 MED ORDER — MORPHINE SULFATE (PF) 4 MG/ML IV SOLN
4.0000 mg | Freq: Once | INTRAVENOUS | Status: AC
  Administered 2021-01-31: 4 mg via INTRAVENOUS
  Filled 2021-01-31: qty 1
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
## 2021-01-31 NOTE — ED Notes (Signed)
Pt requesting to speak to [employer]. Secretary called [employer] and this RN spoke with police station. RN used interpreter to answer questions from pt and translate information provided by police. All questions were answered and pt is very appreciative of staff. Pt also requesting his chart be confidential and that none of his information be shared. Spoke with registration whom is going to work on that for the pt.

## 2021-01-31 NOTE — ED Notes (Signed)
Pt returned from CT °
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
## 2021-01-31 NOTE — ED Notes (Signed)
Report given to [facility] , on 2W

## 2021-01-31 NOTE — ED Notes (Signed)
Pt transported to CT ?
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
## 2021-01-31 NOTE — Progress Notes (Signed)
  Echocardiogram 2D Echocardiogram has been performed.  Gerda Diss 01/31/2021, 11:24 AM

## 2021-02-01 DIAGNOSIS — E1140 Type 2 diabetes mellitus with diabetic neuropathy, unspecified: Secondary | ICD-10-CM | POA: Diagnosis present

## 2021-02-01 DIAGNOSIS — H903 Sensorineural hearing loss, bilateral: Secondary | ICD-10-CM | POA: Diagnosis present

## 2021-02-01 DIAGNOSIS — R42 Dizziness and giddiness: Secondary | ICD-10-CM | POA: Diagnosis present

## 2021-02-01 DIAGNOSIS — Z91014 Allergy to mammalian meats: Secondary | ICD-10-CM | POA: Diagnosis not present

## 2021-02-01 DIAGNOSIS — E1169 Type 2 diabetes mellitus with other specified complication: Secondary | ICD-10-CM | POA: Diagnosis present

## 2021-02-01 DIAGNOSIS — Z20822 Contact with and (suspected) exposure to covid-19: Secondary | ICD-10-CM | POA: Diagnosis present

## 2021-02-01 DIAGNOSIS — E7800 Pure hypercholesterolemia, unspecified: Secondary | ICD-10-CM | POA: Diagnosis present

## 2021-02-01 DIAGNOSIS — F430 Acute stress reaction: Secondary | ICD-10-CM | POA: Diagnosis not present

## 2021-02-01 DIAGNOSIS — R778 Other specified abnormalities of plasma proteins: Secondary | ICD-10-CM | POA: Diagnosis present

## 2021-02-01 DIAGNOSIS — F329 Major depressive disorder, single episode, unspecified: Secondary | ICD-10-CM | POA: Diagnosis present

## 2021-02-01 DIAGNOSIS — S20212D Contusion of left front wall of thorax, subsequent encounter: Secondary | ICD-10-CM | POA: Diagnosis not present

## 2021-02-01 DIAGNOSIS — I252 Old myocardial infarction: Secondary | ICD-10-CM | POA: Diagnosis not present

## 2021-02-01 DIAGNOSIS — Z8249 Family history of ischemic heart disease and other diseases of the circulatory system: Secondary | ICD-10-CM | POA: Diagnosis not present

## 2021-02-01 DIAGNOSIS — E781 Pure hyperglyceridemia: Secondary | ICD-10-CM | POA: Diagnosis present

## 2021-02-01 DIAGNOSIS — S060X9A Concussion with loss of consciousness of unspecified duration, initial encounter: Secondary | ICD-10-CM | POA: Diagnosis not present

## 2021-02-01 DIAGNOSIS — F419 Anxiety disorder, unspecified: Secondary | ICD-10-CM | POA: Diagnosis not present

## 2021-02-01 DIAGNOSIS — R079 Chest pain, unspecified: Secondary | ICD-10-CM | POA: Diagnosis not present

## 2021-02-01 DIAGNOSIS — Z7984 Long term (current) use of oral hypoglycemic drugs: Secondary | ICD-10-CM | POA: Diagnosis not present

## 2021-02-01 DIAGNOSIS — K219 Gastro-esophageal reflux disease without esophagitis: Secondary | ICD-10-CM | POA: Diagnosis present

## 2021-02-01 DIAGNOSIS — F4310 Post-traumatic stress disorder, unspecified: Secondary | ICD-10-CM | POA: Diagnosis not present

## 2021-02-01 DIAGNOSIS — Z7982 Long term (current) use of aspirin: Secondary | ICD-10-CM | POA: Diagnosis not present

## 2021-02-01 DIAGNOSIS — F17210 Nicotine dependence, cigarettes, uncomplicated: Secondary | ICD-10-CM | POA: Diagnosis present

## 2021-02-01 DIAGNOSIS — I517 Cardiomegaly: Secondary | ICD-10-CM | POA: Diagnosis present

## 2021-02-01 DIAGNOSIS — S20212A Contusion of left front wall of thorax, initial encounter: Secondary | ICD-10-CM | POA: Diagnosis not present

## 2021-02-01 DIAGNOSIS — H9313 Tinnitus, bilateral: Secondary | ICD-10-CM | POA: Diagnosis present

## 2021-02-01 DIAGNOSIS — G8929 Other chronic pain: Secondary | ICD-10-CM | POA: Diagnosis present

## 2021-02-01 DIAGNOSIS — F411 Generalized anxiety disorder: Secondary | ICD-10-CM | POA: Diagnosis present

## 2021-02-01 DIAGNOSIS — Z833 Family history of diabetes mellitus: Secondary | ICD-10-CM | POA: Diagnosis not present

## 2021-02-01 DIAGNOSIS — Z79899 Other long term (current) drug therapy: Secondary | ICD-10-CM | POA: Diagnosis not present

## 2021-02-01 DIAGNOSIS — S060X9S Concussion with loss of consciousness of unspecified duration, sequela: Secondary | ICD-10-CM | POA: Diagnosis not present

## 2021-02-01 DIAGNOSIS — F4312 Post-traumatic stress disorder, chronic: Secondary | ICD-10-CM | POA: Diagnosis present

## 2021-02-01 LAB — TROPONIN I (HIGH SENSITIVITY)
Troponin I (High Sensitivity): 8 ng/L (ref <18)
Troponin I (High Sensitivity): 8 ng/L (ref <18)

## 2021-02-01 LAB — GLUCOSE, CAPILLARY
Glucose-Capillary: 158 mg/dL — ABNORMAL HIGH (ref 70–99)
Glucose-Capillary: 125 mg/dL — ABNORMAL HIGH (ref 70–99)
Glucose-Capillary: 164 mg/dL — ABNORMAL HIGH (ref 70–99)
Glucose-Capillary: 126 mg/dL — ABNORMAL HIGH (ref 70–99)

## 2021-02-01 MED ORDER — ESCITALOPRAM OXALATE 10 MG PO TABS
10.0000 mg | ORAL_TABLET | Freq: Every day | ORAL | Status: DC
  Administered 2021-02-01 – 2021-02-08 (×8): 10 mg via ORAL
  Filled 2021-02-01 (×8): qty 1

## 2021-02-01 MED ORDER — LIDOCAINE 5 % EX PTCH
1.0000 | MEDICATED_PATCH | CUTANEOUS | Status: DC
  Administered 2021-02-02 – 2021-02-07 (×7): 1 via TRANSDERMAL
  Filled 2021-02-01 (×7): qty 1

## 2021-02-01 MED ORDER — METFORMIN HCL ER 500 MG PO TB24: 1000.0000 mg | ORAL_TABLET | Freq: Every day | ORAL | Status: DC

## 2021-02-01 MED ORDER — DICLOFENAC SODIUM 1 % EX GEL
2.0000 g | Freq: Every day | CUTANEOUS | Status: DC | PRN
  Filled 2021-02-01: qty 100

## 2021-02-01 MED ORDER — METFORMIN HCL ER 500 MG PO TB24
1000.0000 mg | ORAL_TABLET | Freq: Every day | ORAL | Status: DC
  Administered 2021-02-02 – 2021-02-08 (×7): 1000 mg via ORAL
  Filled 2021-02-01 (×8): qty 2

## 2021-02-01 MED ORDER — BUSPIRONE HCL 10 MG PO TABS
5.0000 mg | ORAL_TABLET | Freq: Two times a day (BID) | ORAL | Status: DC
  Administered 2021-02-01 – 2021-02-08 (×15): 5 mg via ORAL
  Filled 2021-02-01 (×15): qty 1
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
## 2021-02-01 NOTE — Progress Notes (Signed)
    RE:  Aaron Reyes       Date of Birth:  Dec 20, 1975     Date:  02/01/21        To Whom It May Concern:  Please be advised that the above-named patient will require a short-term nursing home stay - anticipated 30 days or less for rehabilitation and strengthening.  The plan is for return home.                 MD signature                Date
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
## 2021-02-01 NOTE — Plan of Care (Signed)
  Problem: Clinical Measurements: Goal: Respiratory complications will improve Outcome: Progressing   Problem: Clinical Measurements: Goal: Cardiovascular complication will be avoided Outcome: Progressing   Problem: Pain Managment: Goal: General experience of comfort will improve Outcome: Progressing   
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
## 2021-02-01 NOTE — Plan of Care (Signed)
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
## 2021-02-01 NOTE — Progress Notes (Signed)
FPTS Brief Progress Note  An arabic speaking interpretor was used for this encounter.    S Saw patient at bedside this evening. Patient was reports on going headache and chest tightness. Headaches is frontal and started a few hours ago. Described as a pressure type pain. Associated symptoms include dizziness which he has had most of the day and also blurred vision at times.  Also endorses on going left sided chest pain. 8/10 severity. Toradol which was given earlier has helped.  He also is feeling heaviness in his arms and legs and also feeling anxious.  O: BP 114/79 (BP Location: Left Arm)   Pulse 71   Temp 97.8 F (36.6 C)   Resp 18   Ht 5\' 5"  (1.651 m)   Wt 63.6 kg   SpO2 98%   BMI 23.33 kg/m    General: Alert, no acute distress, anxious appearing Cardio: Normal S1 and S2, RRR, no r/m/g, TTP of left anterior chest Pulm: CTAB, normal work of breathing Extremities: No peripheral edema.  Neuro: Cranial nerve exam normal   A/P: Plan per day team  Chest pain, low suspicion for ACS, likely anxiety/musculoskeletal, trops  8 and 8 -F/u EKG -Tylenol Toradol 15mg  Q6PRN -Lidocaine patch and voltaren gel to apply topically  -Continue to monitor chest pain  Headache & dizziness, likely post concussion Ct head on admission negative -Analgesia as above -Continue to monitor  Anxiety -If worsens can add on home Ativan if needed, I have informed the RN to let know if this happens. -Psych will see pt tomorrow   - Orders reviewed. Labs for AM ordered, which was adjusted as needed.   , MD 02/01/2021, 11:17 PM PGY-3, [facility] Night Resident  Please page [phone] with questions.

## 2021-02-02 ENCOUNTER — Inpatient Hospital Stay (HOSPITAL_COMMUNITY): Payer: Medicaid Other

## 2021-02-02 DIAGNOSIS — S20212D Contusion of left front wall of thorax, subsequent encounter: Secondary | ICD-10-CM

## 2021-02-02 DIAGNOSIS — S060X9S Concussion with loss of consciousness of unspecified duration, sequela: Secondary | ICD-10-CM

## 2021-02-02 DIAGNOSIS — R42 Dizziness and giddiness: Secondary | ICD-10-CM | POA: Diagnosis not present

## 2021-02-02 DIAGNOSIS — F430 Acute stress reaction: Secondary | ICD-10-CM

## 2021-02-02 DIAGNOSIS — F419 Anxiety disorder, unspecified: Secondary | ICD-10-CM

## 2021-02-02 LAB — GLUCOSE, CAPILLARY
Glucose-Capillary: 138 mg/dL — ABNORMAL HIGH (ref 70–99)
Glucose-Capillary: 109 mg/dL — ABNORMAL HIGH (ref 70–99)
Glucose-Capillary: 97 mg/dL (ref 70–99)
Glucose-Capillary: 115 mg/dL — ABNORMAL HIGH (ref 70–99)

## 2021-02-02 IMAGING — MR MR BRAIN/IAC WO/W CM
21 of 23 series · 42 of 48 positions shown · IV contrast (Gadavist)
Comparison: None.

CLINICAL DATA: Vertigo

EXAM:
MRI HEAD WITHOUT AND WITH CONTRAST
TECHNIQUE: Multiplanar, multiecho pulse sequences of the brain and surrounding
structures were obtained without and with intravenous contrast.
CONTRAST:  6mL GADAVIST GADOBUTROL 1 MMOL/ML IV SOLN

[Series 5: DWI · axial · 3.0mm · 0.88mm/px · z∈[-40,+98]mm · 4 of 96 slices shown (1 of 6)]
[im 1/96]
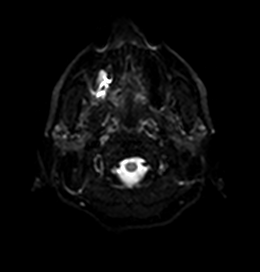
[im 32/96]
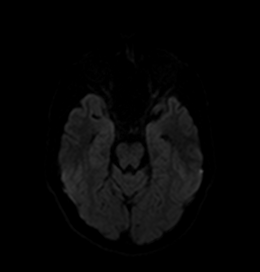
[im 64/96]
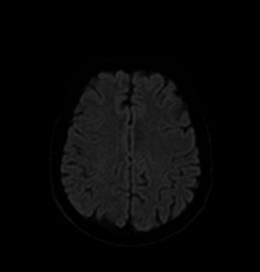
[im 96/96]
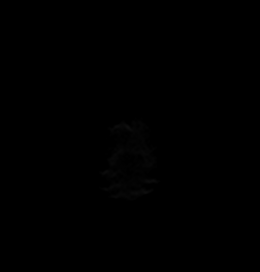

[Series 6: DWI · axial · 3.0mm · 0.88mm/px · z∈[-40,+98]mm · 2 of 47 slices shown (2 of 6)]
[im 1/47]
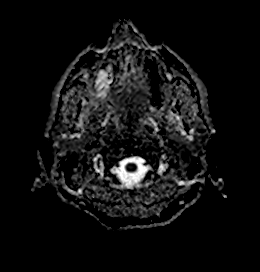
[im 47/47]
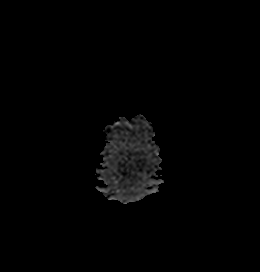

[Series 7: DWI · coronal · 4.0mm · 0.88mm/px · 4 of 72 slices shown (3 of 6)]
[im 1/72]
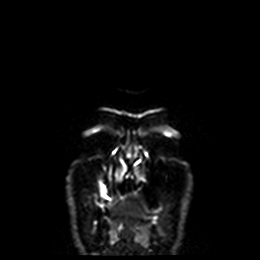
[im 24/72]
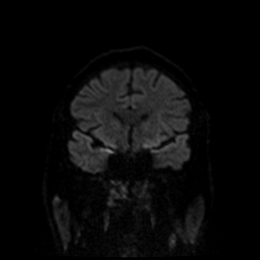
[im 48/72]
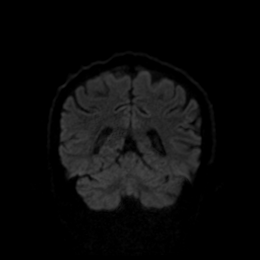
[im 72/72]
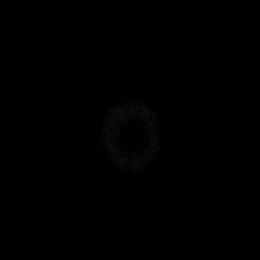

[Series 8: DWI · coronal · 4.0mm · 0.88mm/px · 2 of 36 slices shown (4 of 6)]
[im 1/36]
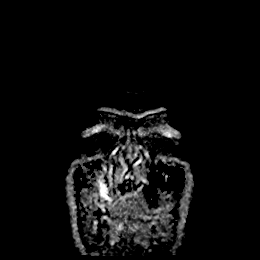
[im 36/36]
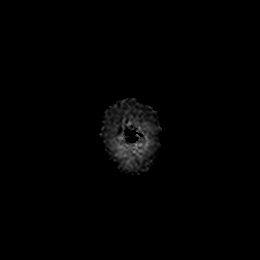

[Series 9: T1 · sagittal · 5.0mm · 0.75mm/px · 1 of 25 slices shown (1 of 2)]
[im 1/25]
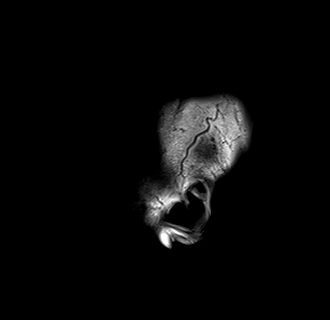

[Series 10: DWI · coronal · 4.0mm · 0.88mm/px · 4 of 68 slices shown (5 of 6)]
[im 1/68]
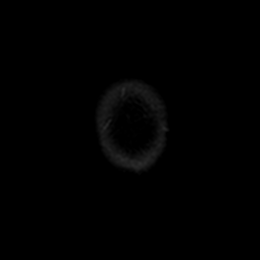
[im 23/68]
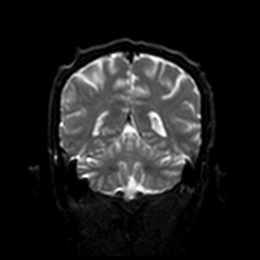
[im 45/68]
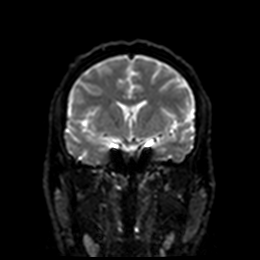
[im 68/68]
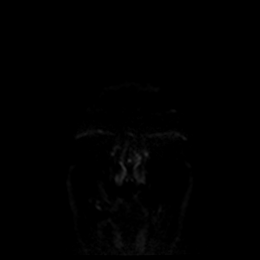

[Series 11: DWI · coronal · 4.0mm · 0.88mm/px · 2 of 34 slices shown (6 of 6)]
[im 1/34]
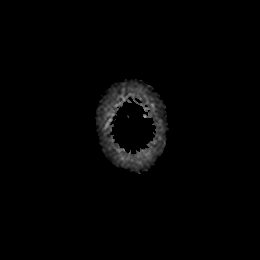
[im 34/34]
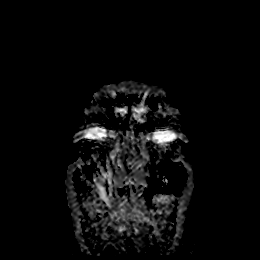

[Series 12: T2 · axial · 5.0mm · 0.72mm/px · 1 of 25 slices shown]
[im 1/25]
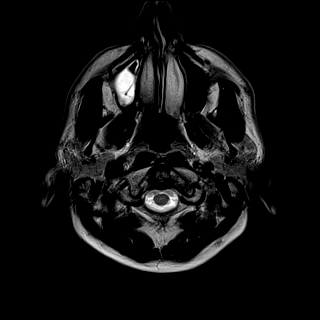

[Series 13: FLAIR · axial · 5.0mm · 0.45mm/px · 1 of 25 slices shown]
[im 1/25]
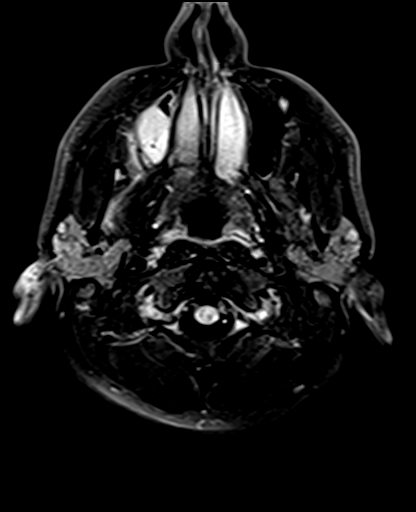

[Series 14: mag_images · axial · 3.0mm · 0.90mm/px · z∈[-37,+110]mm · 3 of 52 slices shown]
[im 1/52]
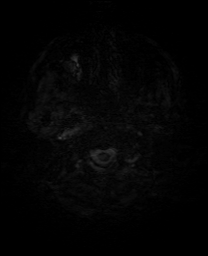
[im 26/52]
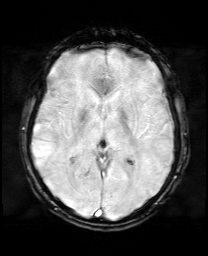
[im 52/52]
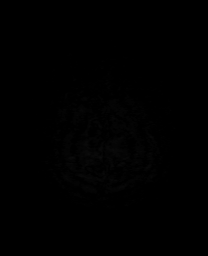

[Series 15: pha_images · axial · 3.0mm · 0.90mm/px · z∈[-37,+107]mm · 3 of 51 slices shown]
[im 1/51]
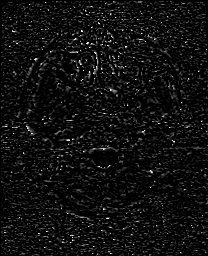
[im 26/51]
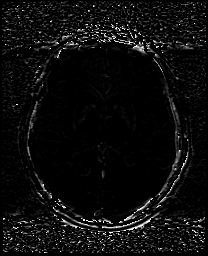
[im 51/51]
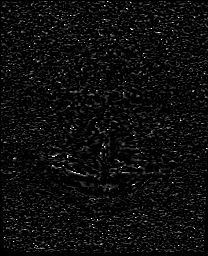

[Series 16: swi_images · axial · 3.0mm · 0.90mm/px · z∈[-37,+110]mm · 3 of 52 slices shown]
[im 1/52]
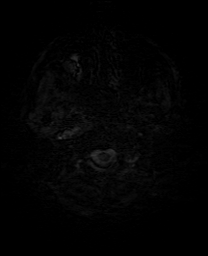
[im 26/52]
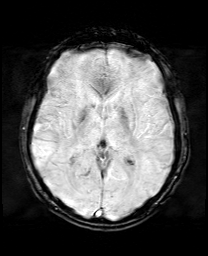
[im 52/52]
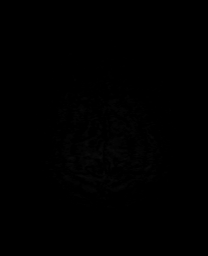

[Series 17: mip_images(sw) · axial · 24.0mm · 0.90mm/px · z∈[-27,+100]mm · 2 of 45 slices shown]
[im 1/45]
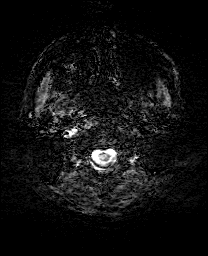
[im 45/45]
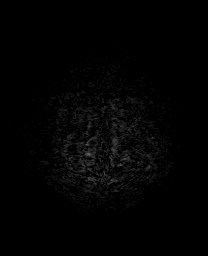

[Series 19: t2_(id)_tra_iso · axial · 0.6mm · 0.56mm/px · z∈[-53,-15]mm · 3 of 64 slices shown]
[im 1/64]
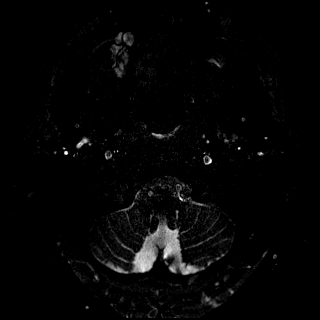
[im 32/64]
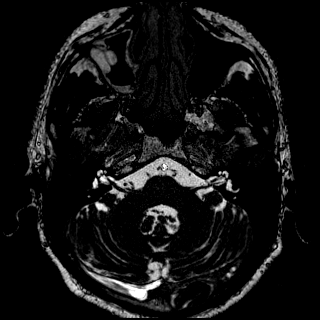
[im 64/64]
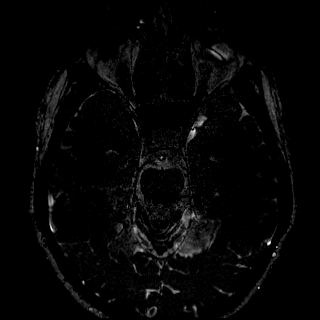

[Series 20: t1_tse_tra_3mm · axial · 3.0mm · 0.31mm/px · 1 of 15 slices shown (1 of 2)]
[im 1/15]
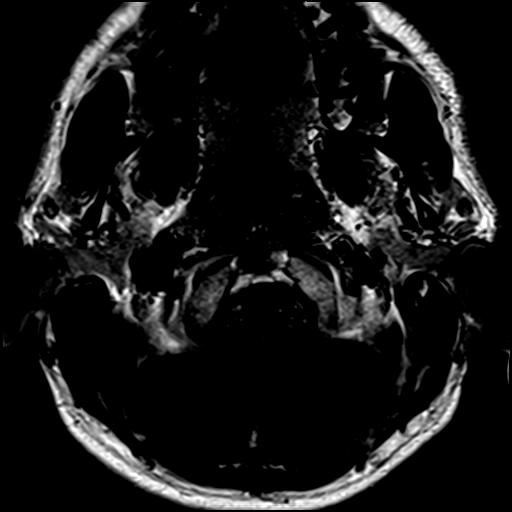

[Series 21: t1_tse_r_cor_3mm · coronal · 3.0mm · 0.33mm/px · 1 of 13 slices shown (1 of 2)]
[im 1/13]
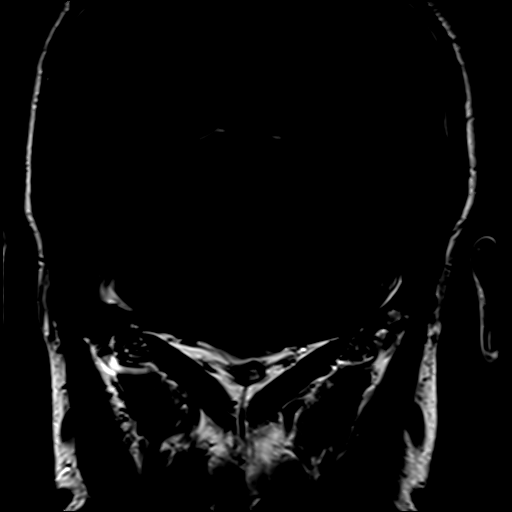

[Series 22: T2 post-contrast · coronal · 5.0mm · 0.72mm/px · 1 of 27 slices shown]
[im 1/27]
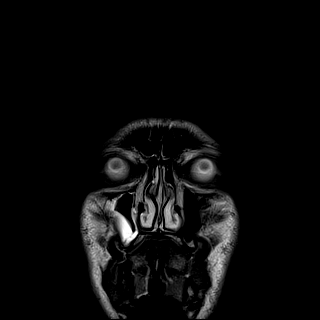

[Series 23: t1_tse_tra_3mm · axial · 3.0mm · 0.37mm/px · 1 of 15 slices shown (2 of 2)]
[im 1/15]
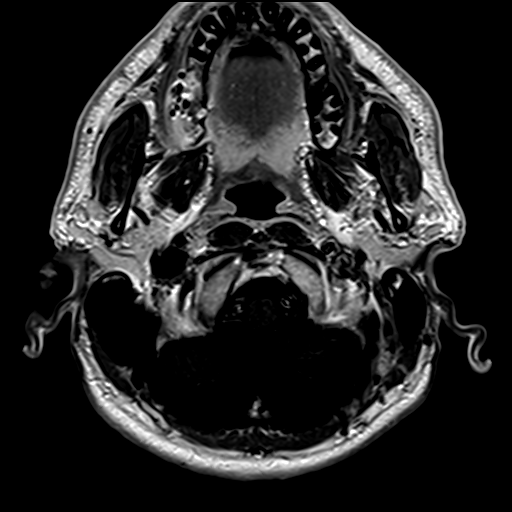

[Series 24: t1_tse_r_cor_3mm · coronal · 3.0mm · 0.33mm/px · 1 of 13 slices shown (2 of 2)]
[im 1/13]
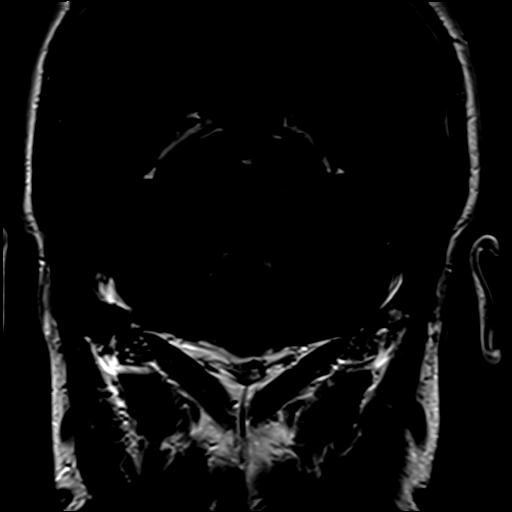

[Series 26: T1 post-contrast · coronal · 5.0mm · 0.34mm/px · 1 of 27 slices shown]
[im 1/27]
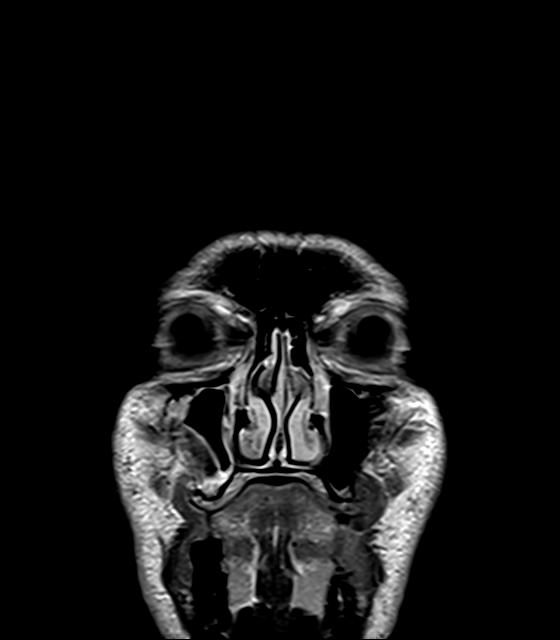

[Series 27: T1 · sagittal · 5.0mm · 0.75mm/px · 1 of 25 slices shown (2 of 2)]
[im 1/25]
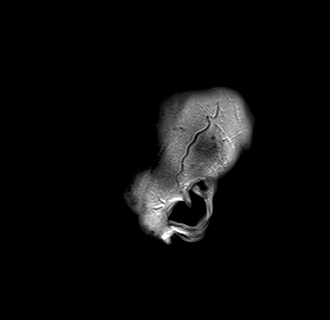

[42 of 48 positions shown; findings below may reference images not displayed]

FINDINGS: Brain: No acute infarct, mass effect or extra-axial collection. No
acute or chronic hemorrhage. Normal white matter signal, parenchymal
volume and CSF spaces. The midline structures are normal. There is
no cerebellopontine angle mass. The cochleae and semicircular canals
are normal. No focal abnormality along the course of the 7th and 8th
cranial nerves. Normal porus acusticus and vestibular aqueduct
bilaterally.

Vascular: Major flow voids are preserved.

Skull and upper cervical spine: Normal calvarium and skull base.
Visualized upper cervical spine and soft tissues are normal.

Sinuses/Orbits:No paranasal sinus fluid levels or advanced mucosal
thickening. No mastoid or middle ear effusion. Normal orbits.
IMPRESSION: Normal MRI of the brain and internal auditory canals.

## 2021-02-02 MED ORDER — IBUPROFEN 200 MG PO TABS
400.0000 mg | ORAL_TABLET | Freq: Four times a day (QID) | ORAL | Status: DC | PRN
  Administered 2021-02-02 – 2021-02-08 (×5): 400 mg via ORAL
  Filled 2021-02-02 (×5): qty 2

## 2021-02-02 MED ORDER — QUETIAPINE FUMARATE 25 MG PO TABS
25.0000 mg | ORAL_TABLET | Freq: Every day | ORAL | Status: DC
  Administered 2021-02-02 – 2021-02-07 (×6): 25 mg via ORAL
  Filled 2021-02-02 (×6): qty 1

## 2021-02-02 MED ORDER — GABAPENTIN 300 MG PO CAPS
300.0000 mg | ORAL_CAPSULE | Freq: Every day | ORAL | Status: DC
  Administered 2021-02-02 – 2021-02-07 (×6): 300 mg via ORAL
  Filled 2021-02-02 (×6): qty 1

## 2021-02-02 MED ORDER — GADOBUTROL 1 MMOL/ML IV SOLN
6.0000 mL | Freq: Once | INTRAVENOUS | Status: AC | PRN
  Administered 2021-02-02: 6 mL via INTRAVENOUS

## 2021-02-02 NOTE — Progress Notes (Signed)
Called charge RN to ask if EKG could be done ASAP as pt has been having chest pain since yesterday.   Aaron Reyes  PGY-3, [facility]

## 2021-02-02 NOTE — Plan of Care (Signed)
  Problem: Clinical Measurements: Goal: Will remain free from infection Outcome: Progressing Goal: Diagnostic test results will improve Outcome: Progressing Goal: Respiratory complications will improve Outcome: Progressing Goal: Cardiovascular complication will be avoided Outcome: Progressing   Problem: Elimination: Goal: Will not experience complications related to bowel motility Outcome: Progressing   

## 2021-02-02 NOTE — Progress Notes (Signed)
? ?  Inpatient Rehab Admissions Coordinator : ? ?Per therapy recommendations patient was screened for CIR candidacy by Clarnce Homan RN MSN. Patient does not appear to demonstrate the medical neccesity for a Hospital Rehabilitation /CIR admit. I will not place a Rehab Consult. Recommend other Rehab Venues to be pursued. Please contact me with any questions. ? ?Niaya Hickok RN MSN ?Admissions Coordinator ?[phone]  ?
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
## 2021-02-02 NOTE — Progress Notes (Addendum)
FPTS Brief Progress Note  S Saw patient at bedside this evening. Patient was awake in bed. He reports on going headache, chest pain and dizziness. Reassured pt that his MRI brain was negative this evening. He was appreciative.  O: BP 108/65 (BP Location: Left Arm)   Pulse 76   Temp 97.7 F (36.5 C)   Resp 19   Ht 5\' 5"  (1.651 m)   Wt 63.6 kg   SpO2 99%   BMI 23.33 kg/m    General: Alert, no acute distress, anxious appearing male   A/P: Plan per day team  Dizziness/tinnitus MRI brain: negative -Neurology following appreciate recs -Reglan 5mg  Q8PRN  -Consider vestibular rehab  Anxiety -Seroquel 25mg  QHS  -F/u with a therapist as outpatient  -Orders reviewed. Labs for AM not ordered, which was adjusted as needed.   , MD 02/02/2021, 9:54 PM PGY-3, [facility] Night Resident  Please page [phone] with questions.
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
## 2021-02-03 DIAGNOSIS — F430 Acute stress reaction: Secondary | ICD-10-CM | POA: Diagnosis not present

## 2021-02-03 DIAGNOSIS — F419 Anxiety disorder, unspecified: Secondary | ICD-10-CM | POA: Diagnosis not present

## 2021-02-03 DIAGNOSIS — S060X9S Concussion with loss of consciousness of unspecified duration, sequela: Secondary | ICD-10-CM | POA: Diagnosis not present

## 2021-02-03 DIAGNOSIS — R42 Dizziness and giddiness: Secondary | ICD-10-CM | POA: Diagnosis not present

## 2021-02-03 LAB — GLUCOSE, CAPILLARY
Glucose-Capillary: 131 mg/dL — ABNORMAL HIGH (ref 70–99)
Glucose-Capillary: 124 mg/dL — ABNORMAL HIGH (ref 70–99)
Glucose-Capillary: 113 mg/dL — ABNORMAL HIGH (ref 70–99)

## 2021-02-03 MED ORDER — METOCLOPRAMIDE HCL 5 MG PO TABS
5.0000 mg | ORAL_TABLET | Freq: Three times a day (TID) | ORAL | Status: DC | PRN
  Administered 2021-02-03: 5 mg via ORAL
  Filled 2021-02-03: qty 1

## 2021-02-03 NOTE — Progress Notes (Signed)
FPTS Brief Progress Note   An arabic speaking interpretor was used for this encounter.   S Saw patient at bedside this evening. Patient was sitting up in his bed.  He reports that he still has ongoing chest pressure, headaches and he can feel his heart beating in his ears.  He also wanted to know when he is ready for discharge.  I explained that he is medically stable for discharge however we are still looking for a safe disposition for him.  O: BP 123/77 (BP Location: Left Arm)   Pulse 67   Temp 97.7 F (36.5 C) (Oral)   Resp 18   Ht 5\' 5"  (1.651 m)   Wt 63.6 kg   SpO2 99%   BMI 23.33 kg/m    General: Alert, no acute distress, anxious appearing  A/P: Plan per day team  Medically stable for discharge -3 mg melatonin as needed -Orders reviewed. Labs for AM not ordered, which was adjusted as needed.   , MD 02/03/2021, 10:16 PM PGY-3, [facility] Night Resident  Please page [phone] with questions.
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
## 2021-02-03 NOTE — Progress Notes (Signed)
Dr. Allena Katz at bedside assessing patient with use of interpreter.
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
## 2021-02-04 ENCOUNTER — Telehealth: Payer: Self-pay | Admitting: Student

## 2021-02-04 DIAGNOSIS — R42 Dizziness and giddiness: Secondary | ICD-10-CM | POA: Diagnosis not present

## 2021-02-04 DIAGNOSIS — S20212D Contusion of left front wall of thorax, subsequent encounter: Secondary | ICD-10-CM | POA: Diagnosis not present

## 2021-02-04 DIAGNOSIS — F419 Anxiety disorder, unspecified: Secondary | ICD-10-CM | POA: Diagnosis not present

## 2021-02-04 DIAGNOSIS — F430 Acute stress reaction: Secondary | ICD-10-CM | POA: Diagnosis not present

## 2021-02-04 LAB — GLUCOSE, CAPILLARY: Glucose-Capillary: 120 mg/dL — ABNORMAL HIGH (ref 70–99)

## 2021-02-04 MED ORDER — MELATONIN 3 MG PO TABS
3.0000 mg | ORAL_TABLET | Freq: Every day | ORAL | Status: DC | PRN
  Administered 2021-02-04: 3 mg via ORAL
  Filled 2021-02-04: qty 1
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
## 2021-02-04 NOTE — Plan of Care (Signed)
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
## 2021-02-05 DIAGNOSIS — S20212A Contusion of left front wall of thorax, initial encounter: Secondary | ICD-10-CM | POA: Diagnosis not present

## 2021-02-05 DIAGNOSIS — R079 Chest pain, unspecified: Secondary | ICD-10-CM | POA: Diagnosis not present

## 2021-02-05 DIAGNOSIS — F419 Anxiety disorder, unspecified: Secondary | ICD-10-CM | POA: Diagnosis not present

## 2021-02-05 DIAGNOSIS — S060X9S Concussion with loss of consciousness of unspecified duration, sequela: Secondary | ICD-10-CM | POA: Diagnosis not present

## 2021-02-05 LAB — CK: Total CK: 43 U/L — ABNORMAL LOW (ref 49–397)

## 2021-02-05 LAB — TSH: TSH: 1.697 u[IU]/mL (ref 0.350–4.500)

## 2021-02-05 LAB — GLUCOSE, CAPILLARY: Glucose-Capillary: 143 mg/dL — ABNORMAL HIGH (ref 70–99)

## 2021-02-05 MED ORDER — ACETAMINOPHEN 325 MG PO TABS: 650.0000 mg | ORAL_TABLET | Freq: Four times a day (QID) | ORAL | Status: DC | PRN
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
## 2021-02-05 NOTE — Progress Notes (Addendum)
FPTS Interim Progress Note  Arabic interpreter was used for this encounter.  S:Paged by RN for chest pain with left arm and jaw pain. Upon walking by the room and observing from the hallway, patient was sitting upright comfortably messaging on his cell phone playing music in no distress. When I entered the room, he laid down and clutched his chest saying he was having chest pain with radiation to the arm. He was very well-appearing and intermittently acted as if he was in out of proportion acute pain but when leaving the room, he was in no distress and resting comfortably.  He had tenderness to palpation of the left chest and I offered a lidocaine patch, which he was amenable to. Per RN, he consistently removes the patch after receiving it.  His chest pain complaint and dizziness have been chronic and ongoing this admission and cardiac workup has been completely negative thus far. There is no indication at this point to repeat any tests including EKG, chest X-ray, echocardiogram and I relayed this information to the patient, to which he agreed and understood. There is no concern for acute ACS or MI at this point in time. He has been medically stable for discharge and we are still looking for a safe disposition for him  O: BP 114/80 (BP Location: Right Arm)   Pulse 69   Temp 97.8 F (36.6 C) (Oral)   Resp 17   Ht 5\' 5"  (1.651 m)   Wt 63.6 kg   SpO2 98%   BMI 23.33 kg/m   General: Well-appearing, no acute distress, slightly anxious CV: Regular rate and rhythm. Tenderness to palpation of chest wall Lungs: CTAB Ext: Warm, dry, well-perfused  A/P: Medically stable for discharge Lidocaine patch PRN Voltaren gel PRN for pain Tylenol q6h PRN  , DO 02/05/2021, 6:40 PM PGY-1, [facility] Family Medicine Service pager [phone]

## 2021-02-05 NOTE — Progress Notes (Signed)
FPTS Interim Progress Note  S: Night rounds.  Keyed up interpreter pad with Arabic interpreter.  Entered room to find patient sleeping comfortably in no distress.  I did not wake this patient.  I spoke to his nurse, who reported no concerns at this time and no requests for either patient or herself.  O: BP 126/75 (BP Location: Right Arm)   Pulse 77   Temp 98.1 F (36.7 C) (Oral)   Resp 17   Ht 5\' 5"  (1.651 m)   Wt 63.6 kg   SpO2 96%   BMI 23.33 kg/m    A/P: Dizziness, likely concussion At this time, patient remains medically stable for discharge.  Appreciate social work assistance with placement.  Patient to receive ambulatory referral to vestibular rehab before discharge.  , MD 02/05/2021, 1:31 AM PGY-2, [facility] Family Medicine Service pager [phone]

## 2021-02-05 NOTE — Progress Notes (Signed)
FPTS Brief Progress Note  S: Upon entering the room, pt was joking and laughing with family member and son.  Via interpreter, patient expressed that he still had chest pain and left arm pain in addition to dizziness and headache.  He endorsed ringing in the ears.   O: BP (!) 138/91 (BP Location: Left Arm)   Pulse 87   Temp 98 F (36.7 C) (Oral)   Resp 18   Ht 5\' 5"  (1.651 m)   Wt 63.6 kg   SpO2 100%   BMI 23.33 kg/m   General: Alert and oriented in no apparent distress Heart: Regular rate and rhythm with no murmurs appreciated Lungs: CTA bilaterally, no wheezing Abdomen: Bowel sounds present, no abdominal pain Skin: Warm and dry Extremities: No lower extremity edema  A/P: Dizziness, likely Concussion It was discussed with the patient again that he has had extensive work-up for cardiac etiology of chest pain, all of which has been unremarkable and benign.  Additionally a full work-up has been performed for his headaches and dizziness, likely causes concussion.  Patient understands.  Awaiting SNF.  - Orders reviewed. Labs for AM were ordered, which was adjusted as needed.   , MD 02/05/2021, 11:21 PM PGY-1, [facility] Family Medicine Night Resident  Please page [phone] with questions.

## 2021-02-06 DIAGNOSIS — F419 Anxiety disorder, unspecified: Secondary | ICD-10-CM | POA: Diagnosis not present

## 2021-02-06 DIAGNOSIS — S20212D Contusion of left front wall of thorax, subsequent encounter: Secondary | ICD-10-CM | POA: Diagnosis not present

## 2021-02-06 DIAGNOSIS — R42 Dizziness and giddiness: Secondary | ICD-10-CM | POA: Diagnosis not present

## 2021-02-06 DIAGNOSIS — F4310 Post-traumatic stress disorder, unspecified: Secondary | ICD-10-CM

## 2021-02-06 DIAGNOSIS — S060X9S Concussion with loss of consciousness of unspecified duration, sequela: Secondary | ICD-10-CM | POA: Diagnosis not present

## 2021-02-06 LAB — GLUCOSE, CAPILLARY: Glucose-Capillary: 157 mg/dL — ABNORMAL HIGH (ref 70–99)
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
## 2021-02-06 NOTE — Plan of Care (Signed)
  Problem: Education: Goal: Knowledge of General Education information will improve Description: Including pain rating scale, medication(s)/side effects and non-pharmacologic comfort measures Outcome: Progressing   Problem: Health Behavior/Discharge Planning: Goal: Ability to manage health-related needs will improve Outcome: Progressing   Problem: Activity: Goal: Risk for activity intolerance will decrease Outcome: Progressing   Problem: Nutrition: Goal: Adequate nutrition will be maintained Outcome: Progressing   Problem: Coping: Goal: Level of anxiety will decrease Outcome: Progressing   Problem: Elimination: Goal: Will not experience complications related to bowel motility Outcome: Progressing Goal: Will not experience complications related to urinary retention Outcome: Progressing   Problem: Pain Managment: Goal: General experience of comfort will improve Outcome: Progressing   Problem: Safety: Goal: Ability to remain free from injury will improve Outcome: Progressing   

## 2021-02-06 NOTE — Plan of Care (Signed)
  Problem: Education: Goal: Knowledge of General Education information will improve Description: Including pain rating scale, medication(s)/side effects and non-pharmacologic comfort measures 02/06/2021 2047 by Aubery Lapping, RN Outcome: Progressing 02/06/2021 2039 by Aubery Lapping, RN Outcome: Progressing   Problem: Health Behavior/Discharge Planning: Goal: Ability to manage health-related needs will improve 02/06/2021 2047 by Aubery Lapping, RN Outcome: Progressing 02/06/2021 2039 by Aubery Lapping, RN Outcome: Progressing   Problem: Activity: Goal: Risk for activity intolerance will decrease 02/06/2021 2047 by Aubery Lapping, RN Outcome: Progressing 02/06/2021 2039 by Aubery Lapping, RN Outcome: Progressing   Problem: Nutrition: Goal: Adequate nutrition will be maintained 02/06/2021 2047 by Aubery Lapping, RN Outcome: Progressing 02/06/2021 2039 by Aubery Lapping, RN Outcome: Progressing   Problem: Coping: Goal: Level of anxiety will decrease 02/06/2021 2047 by Aubery Lapping, RN Outcome: Progressing 02/06/2021 2039 by Aubery Lapping, RN Outcome: Progressing   Problem: Elimination: Goal: Will not experience complications related to bowel motility 02/06/2021 2047 by Aubery Lapping, RN Outcome: Progressing 02/06/2021 2039 by Aubery Lapping, RN Outcome: Progressing Goal: Will not experience complications related to urinary retention 02/06/2021 2047 by Aubery Lapping, RN Outcome: Progressing 02/06/2021 2039 by Aubery Lapping, RN Outcome: Progressing   Problem: Pain Managment: Goal: General experience of comfort will improve 02/06/2021 2047 by Aubery Lapping, RN Outcome: Progressing 02/06/2021 2039 by Aubery Lapping, RN Outcome: Progressing   Problem: Safety: Goal: Ability to remain free from injury will improve 02/06/2021 2047 by Aubery Lapping, RN Outcome:  Progressing 02/06/2021 2039 by Aubery Lapping, RN Outcome: Progressing
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
## 2021-02-07 DIAGNOSIS — F4310 Post-traumatic stress disorder, unspecified: Secondary | ICD-10-CM | POA: Diagnosis not present

## 2021-02-07 DIAGNOSIS — F430 Acute stress reaction: Secondary | ICD-10-CM | POA: Diagnosis not present

## 2021-02-07 DIAGNOSIS — S20212D Contusion of left front wall of thorax, subsequent encounter: Secondary | ICD-10-CM | POA: Diagnosis not present

## 2021-02-07 DIAGNOSIS — F419 Anxiety disorder, unspecified: Secondary | ICD-10-CM | POA: Diagnosis not present

## 2021-02-07 LAB — GLUCOSE, CAPILLARY: Glucose-Capillary: 138 mg/dL — ABNORMAL HIGH (ref 70–99)

## 2021-02-07 NOTE — Plan of Care (Signed)
  Problem: Education: Goal: Knowledge of General Education information will improve Description: Including pain rating scale, medication(s)/side effects and non-pharmacologic comfort measures Outcome: Progressing   Problem: Health Behavior/Discharge Planning: Goal: Ability to manage health-related needs will improve Outcome: Progressing   Problem: Activity: Goal: Risk for activity intolerance will decrease Outcome: Progressing   Problem: Nutrition: Goal: Adequate nutrition will be maintained Outcome: Progressing   Problem: Coping: Goal: Level of anxiety will decrease Outcome: Progressing   Problem: Elimination: Goal: Will not experience complications related to bowel motility Outcome: Progressing Goal: Will not experience complications related to urinary retention Outcome: Progressing   Problem: Pain Managment: Goal: General experience of comfort will improve Outcome: Progressing   Problem: Safety: Goal: Ability to remain free from injury will improve Outcome: Progressing   
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
## 2021-02-07 NOTE — Progress Notes (Signed)
FPTS Brief Progress Note  S Saw patient at bedside this evening. Patient was sleeping comfortably. I did not wake the patient.   O: BP (!) 134/93 (BP Location: Right Arm)   Pulse 100   Temp 98.4 F (36.9 C) (Oral)   Resp 20   Ht 5\' 5"  (1.651 m)   Wt 63.6 kg   SpO2 100%   BMI 23.33 kg/m   General: Sleeping comfortably in bed, no acute distress  A/P: Plan per day team  - Orders reviewed. Labs for AM ordered, which was adjusted as needed.  - Chaplain consulted by day team   , MD 02/08/2021, 1:12 AM PGY-3, [facility] Family [facility] Night Resident  Please page [phone] with questions.

## 2021-02-08 DIAGNOSIS — S060X9S Concussion with loss of consciousness of unspecified duration, sequela: Secondary | ICD-10-CM | POA: Diagnosis not present

## 2021-02-08 DIAGNOSIS — F4310 Post-traumatic stress disorder, unspecified: Secondary | ICD-10-CM | POA: Diagnosis not present

## 2021-02-08 DIAGNOSIS — F419 Anxiety disorder, unspecified: Secondary | ICD-10-CM | POA: Diagnosis not present

## 2021-02-08 DIAGNOSIS — S20212D Contusion of left front wall of thorax, subsequent encounter: Secondary | ICD-10-CM | POA: Diagnosis not present

## 2021-02-08 MED ORDER — GABAPENTIN 300 MG PO CAPS: 300.0000 mg | ORAL_CAPSULE | Freq: Every day | ORAL | 0 refills | Status: DC

## 2021-02-08 MED ORDER — QUETIAPINE FUMARATE 25 MG PO TABS: 25.0000 mg | ORAL_TABLET | Freq: Every day | ORAL | 0 refills | Status: DC
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
## 2021-02-08 NOTE — TOC Transition Note (Signed)
Transition of Care [facility]) - CM/SW Discharge Note   Patient Details  Name: Warrick Llera MRN: [id number] Date of Birth: February 09, 1976  [facility]) CM/SW Contact:  Lorri Frederick, LCSW Phone Number: 02/08/2021, 4:06 PM   Clinical Narrative:   Pt discharging to stay with friends.  Son [name] will transport from the hospital.  [employer] to provide rolling walker: son bringing cash when he returns to pick pt up this evening which needs to get to [place] tomorrow. No other needs identified.      Final next level of care: Home/Self Care Barriers to Discharge: Barriers Resolved   Patient Goals and CMS Choice   CMS Medicare.gov Compare Post Acute Care list provided to:: Patient Choice offered to / list presented to : Patient  Discharge Placement                  Name of family member notified: son Mohammed Patient and family notified of of transfer: 02/08/21  Discharge Plan and Services In-house Referral: Clinical Social Work   Post Acute Care Choice: Skilled Nursing Facility          DME Arranged: Dan Humphreys rolling DME Agency: [employer] Date DME Agency Contacted: 02/08/21 Time DME Agency Contacted: 1550 Representative spoke with at DME Agency: Velna Hatchet [facility] Arranged: NA          Social Determinants of Health (SDOH) Interventions     Readmission Risk Interventions No flowsheet data found.
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
## 2021-02-08 NOTE — Discharge Instructions (Addendum)
Dear Teola Bradley [facility],   Thank you so much for allowing [place] to be part of your care!  You were admitted to [facility] for concussion and chest pain.  For your concussion, we recommend that you do not drive until cleared by physician.  This will likely take time for symptoms to completely resolve.  Please plan to follow-up with your primary care doctor office on 9/8 at 10 AM and the family medicine center. In regards to your chest pain, it was determined that you did not have any type of heart attack or lung disease.  We have prescribed a new medication called Seroquel, please take this daily as prescribed.   POST-HOSPITAL & CARE INSTRUCTIONS Please decrease the dose of your gabapentin to 300 mg 3 times a day. We have prescribed a medication called Seroquel.  Please take this daily as prescribed. We also have stopped your advil and trazodone. Please let PCP/Specialists know of any changes that were made.  Please see medications section of this packet for any medication changes.   DOCTOR'S APPOINTMENT & FOLLOW UP CARE INSTRUCTIONS  Future Appointments  Date Time Provider Department Center  02/23/2021 10:00 AM [name], DO [facility]  03/22/2021  2:30 PM [place], Tommas Olp, PA [facility] None    RETURN PRECAUTIONS: Please return to care if you experience dizziness or inability to walk.  Take care and be well!  Family Medicine Teaching Service Inpatient Team [place]  [facility]  [address] [place], [place] [address] [phone]
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
## 2021-02-22 NOTE — Progress Notes (Deleted)
   Subjective:    Patient ID: Aaron Reyes, male    DOB: 1976/05/31, 45 y.o.   MRN: [id number]   CC: Hospital follow-up  HPI:   Sensorineural hearing loss/dizziness/tenderness: Patient presents for hospital follow-up of visit dizziness and vision blurring after head injury persisting greater than 12 hours after head injury.  CT and MRI head unremarkable.  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   Testicular mass 03/18/2015    Past Surgical History:  Procedure Laterality Date   CARDIAC CATHETERIZATION     CARDIAC CATHETERIZATION     x 2   CARDIAC CATHETERIZATION N/A 04/11/2015   Procedure: Left Heart Cath and Coronary Angiography;  Surgeon: Lyn Records, MD;  Location: [facility] INVASIVE CV LAB;  Service: Cardiovascular;  Laterality: N/A;   HERNIA REPAIR      Past medical history, surgical, family, and social history reviewed and updated in the EMR as appropriate.  Objective:  There were no vitals taken for this visit.  Vitals and nursing note reviewed  General: NAD, pleasant, able to participate in exam Cardiac: RRR, S1 S2 present. normal heart sounds, no murmurs. Respiratory: CTAB, normal effort, No wheezes,  rales or rhonchi Extremities: no edema or cyanosis. Skin: warm and dry, no rashes noted Neuro: alert, no obvious focal deficits Psych: Normal affect and mood   Assessment & Plan:    Bilateral sensorineural hearing loss Has had hearing aids in the past but lost his hearing aids.  Patient has been unable to see ENT/audiology.  Patient continued to endorse tinnitus and dizziness while ambulating despite negative work-up.  Will refer to ENT today.  Language barrier Patient requires Arabic interpreter.  Interpreter used for encounter.   No follow-ups on file.   [name], DO [facility]

## 2021-02-22 NOTE — Assessment & Plan Note (Deleted)
Patient requires Arabic interpreter.  Interpreter used for encounter.

## 2021-02-22 NOTE — Assessment & Plan Note (Deleted)
Has had hearing aids in the past but lost his hearing aids.  Patient has been unable to see ENT/audiology.  Patient continued to endorse tinnitus and dizziness while ambulating despite negative work-up.  Will refer to ENT today.

## 2021-02-23 ENCOUNTER — Ambulatory Visit: Payer: Medicaid Other | Admitting: Student

## 2021-02-23 DIAGNOSIS — F411 Generalized anxiety disorder: Secondary | ICD-10-CM

## 2021-02-23 DIAGNOSIS — H903 Sensorineural hearing loss, bilateral: Secondary | ICD-10-CM

## 2021-02-23 DIAGNOSIS — Z720 Tobacco use: Secondary | ICD-10-CM

## 2021-02-23 DIAGNOSIS — F339 Major depressive disorder, recurrent, unspecified: Secondary | ICD-10-CM

## 2021-02-23 DIAGNOSIS — F4310 Post-traumatic stress disorder, unspecified: Secondary | ICD-10-CM

## 2021-02-23 DIAGNOSIS — Z789 Other specified health status: Secondary | ICD-10-CM
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
## 2021-03-10 ENCOUNTER — Ambulatory Visit: Payer: Medicaid Other

## 2021-03-22 ENCOUNTER — Encounter (HOSPITAL_COMMUNITY): Payer: Self-pay | Admitting: Physician Assistant

## 2021-04-19 ENCOUNTER — Ambulatory Visit: Payer: Medicaid Other | Admitting: Student

## 2021-06-28 ENCOUNTER — Other Ambulatory Visit: Payer: Self-pay | Admitting: *Deleted

## 2021-06-28 DIAGNOSIS — E119 Type 2 diabetes mellitus without complications: Secondary | ICD-10-CM

## 2021-06-29 MED ORDER — ASPIRIN 81 MG PO CHEW: 81.0000 mg | CHEWABLE_TABLET | Freq: Every day | ORAL | 3 refills | Status: DC

## 2021-07-03 ENCOUNTER — Telehealth: Payer: Self-pay

## 2021-07-03 ENCOUNTER — Other Ambulatory Visit (HOSPITAL_COMMUNITY): Payer: Self-pay

## 2021-07-03 NOTE — Telephone Encounter (Signed)
Prior Auth for patients medication QUETIAPINE 25MG  approved by [employer] from 07/03/21 to 07/03/22.  Key: [id number]  Patients pharmacy notified.

## 2021-07-03 NOTE — Telephone Encounter (Signed)
A Prior Authorization was initiated for this patients QUETIAPINE 25MG  through CoverMyMeds.   Key: [id number]

## 2021-07-21 ENCOUNTER — Encounter: Payer: Self-pay | Admitting: Family Medicine

## 2021-07-21 ENCOUNTER — Other Ambulatory Visit: Payer: Self-pay

## 2021-07-21 ENCOUNTER — Ambulatory Visit (INDEPENDENT_AMBULATORY_CARE_PROVIDER_SITE_OTHER): Payer: Medicaid Other | Admitting: Family Medicine

## 2021-07-21 VITALS — BP 128/90 | HR 81 | Ht 65.0 in | Wt 142.4 lb

## 2021-07-21 DIAGNOSIS — E119 Type 2 diabetes mellitus without complications: Secondary | ICD-10-CM

## 2021-07-21 DIAGNOSIS — R1013 Epigastric pain: Secondary | ICD-10-CM

## 2021-07-21 LAB — POCT GLYCOSYLATED HEMOGLOBIN (HGB A1C): HbA1c, POC (controlled diabetic range): 7.2 % — AB (ref 0.0–7.0)

## 2021-07-21 NOTE — Patient Instructions (Addendum)
°???? ?????? ?????? ?????. ??? ?? ????? ?????. ?????? ????? ???? ????. ???? ?????? ????????. ????? ??? ????? ?????? ?????? ??????? ??????? ?????? ??????? ° °????? ??? ?????? ???. ????? ???????????. ° °?????? ???????? ??? ??? ?????? ° °??? ??? ???? ?? ????? ?? ????????? ? ?? ????? ?? ??????? ??????? ??? (  336) [id number].  ???? ?????????  ????? ?????     Thank you for coming to see me today. It was a pleasure. Today we discussed your belly pains. I recommend taking tylenol. I am referring you to GI doctors for endoscopy and colonoscopy   Diabetes level is good. Continue metformin.  Please follow-up with me as needed   If you have any questions or concerns, please do not hesitate to call the office at [phone].  Best wishes,   Dr Allena Katz

## 2021-07-21 NOTE — Progress Notes (Addendum)
° ° ° °  SUBJECTIVE:   CHIEF COMPLAINT / HPI:   Aaron Reyes is a 46 y.o. male presents for abdominal pain   An Arabic speaking interpretor was used for this encounter.    Pt's wife present for encounter   ABDOMINAL PAIN Location: epigastric  Pain began 1 days ago Medications tried: protonix but does not help  Similar pain before: no  Prior abdominal surgeries: no  When he has severe pain he has dyspnea   Symptoms Nausea: yes Diarrhea: no Constipation: no Blood in stool: no  Blood in vomit: no Fever: no Dysuria: no Loss of appetite: yes  Weight loss: no  Review of Symptoms - see HPI PMH - Smoking status noted-yes  No hx of EGD or colonoscopy.   Diabetes Patient's current diabetic medications include metformin. Tolerating well without side effects.  Patient endorses compliance with these medications. Does not check CBGs at home. Patient's last A1c was  Lab Results  Component Value Date   HGBA1C 7.2 (A) 07/21/2021   HGBA1C 6.5 (H) 01/31/2021   HGBA1C 6.6 11/23/2020  Current A1c today is 7.2.  Denies abdominal pain, blurred vision, polyuria, polydipsia, hypoglycemia. Patient states they understand that diet and exercise can help with her diabetes.    Last Microalbumin, LDL, Creatinine: Lab Results  Component Value Date   MICROALBUR 30 07/07/2020   LDLCALC 74 09/21/2020   CREATININE 0.89 07/21/2021     Flowsheet Row Office Visit from 07/21/2021 in [facility]  PHQ-9 Total Score 24       PERTINENT  PMH / PSH: PTSD  OBJECTIVE:   BP 128/90    Pulse 81    Ht 5\' 5"  (1.651 m)    Wt 142 lb 6.4 oz (64.6 kg)    SpO2 99%    BMI 23.70 kg/m    General: Alert, no acute distress Cardio: Normal S1 and S2, RRR, no r/m/g Pulm: CTAB, normal work of breathing Abdomen: Bowel sounds normal. Abdomen soft and TTP epigastrium, no guarding Extremities: No peripheral edema.  Neuro: Cranial nerves grossly intact   ASSESSMENT/PLAN:   Epigastric  pain Broad differential for epigastric pain including gastritis, PUD, pancreatitis, cholecystitis etc. Obtained CBC. CMP and lipase today. Pt has never had an EGD or colonoscopy. Referred to GI for further evaluation of the pain +/- EGD & colonoscopy. Recommended tylenol for the pain and to continue PPI. Strict ER precautions given to pt.  Diabetes mellitus without complication (HCC) A1c 7.2. At goal, congratulated pt. Continue Metformin. F/u in 3 months.    , MD PGY-3 [facility]

## 2021-07-22 LAB — COMPREHENSIVE METABOLIC PANEL
ALT: 21 IU/L (ref 0–44)
AST: 19 IU/L (ref 0–40)
Albumin/Globulin Ratio: 1.8 (ref 1.2–2.2)
Albumin: 4.6 g/dL (ref 4.0–5.0)
Alkaline Phosphatase: 106 IU/L (ref 44–121)
BUN/Creatinine Ratio: 17 (ref 9–20)
BUN: 15 mg/dL (ref 6–24)
Bilirubin Total: 0.2 mg/dL (ref 0.0–1.2)
CO2: 22 mmol/L (ref 20–29)
Calcium: 9.8 mg/dL (ref 8.7–10.2)
Chloride: 106 mmol/L (ref 96–106)
Creatinine, Ser: 0.89 mg/dL (ref 0.76–1.27)
Globulin, Total: 2.5 g/dL (ref 1.5–4.5)
Glucose: 167 mg/dL — ABNORMAL HIGH (ref 70–99)
Potassium: 5.3 mmol/L — ABNORMAL HIGH (ref 3.5–5.2)
Sodium: 145 mmol/L — ABNORMAL HIGH (ref 134–144)
Total Protein: 7.1 g/dL (ref 6.0–8.5)
eGFR: 109 mL/min/{1.73_m2} (ref >59)

## 2021-07-22 LAB — CBC
Hematocrit: 46.6 % (ref 37.5–51.0)
Hemoglobin: 15.8 g/dL (ref 13.0–17.7)
MCH: 28.3 pg (ref 26.6–33.0)
MCHC: 33.9 g/dL (ref 31.5–35.7)
MCV: 84 fL (ref 79–97)
Platelets: 278 10*3/uL (ref 150–450)
RBC: 5.58 x10E6/uL (ref 4.14–5.80)
RDW: 13.1 % (ref 11.6–15.4)
WBC: 8 10*3/uL (ref 3.4–10.8)

## 2021-07-22 LAB — LIPASE: Lipase: 52 U/L (ref 13–78)

## 2021-07-23 ENCOUNTER — Other Ambulatory Visit: Payer: Self-pay | Admitting: Family Medicine

## 2021-07-23 DIAGNOSIS — R1013 Epigastric pain: Secondary | ICD-10-CM | POA: Insufficient documentation

## 2021-07-23 NOTE — Assessment & Plan Note (Addendum)
Broad differential for epigastric pain including gastritis, PUD, pancreatitis, cholecystitis etc. Obtained CBC. CMP and lipase today. Pt has never had an EGD or colonoscopy. Referred to GI for further evaluation of the pain +/- EGD & colonoscopy. Recommended tylenol for the pain and to continue PPI. Strict ER precautions given to pt.

## 2021-07-23 NOTE — Assessment & Plan Note (Signed)
A1c 7.2. At goal, congratulated pt. Continue Metformin. F/u in 3 months.

## 2021-07-31 ENCOUNTER — Other Ambulatory Visit: Payer: Medicaid Other

## 2021-07-31 ENCOUNTER — Other Ambulatory Visit: Payer: Self-pay

## 2021-07-31 DIAGNOSIS — R1013 Epigastric pain: Secondary | ICD-10-CM

## 2021-08-01 LAB — COMPREHENSIVE METABOLIC PANEL
ALT: 20 IU/L (ref 0–44)
AST: 19 IU/L (ref 0–40)
Albumin/Globulin Ratio: 1.6 (ref 1.2–2.2)
Albumin: 4.4 g/dL (ref 4.0–5.0)
Alkaline Phosphatase: 100 IU/L (ref 44–121)
BUN/Creatinine Ratio: 14 (ref 9–20)
BUN: 13 mg/dL (ref 6–24)
Bilirubin Total: 0.4 mg/dL (ref 0.0–1.2)
CO2: 19 mmol/L — ABNORMAL LOW (ref 20–29)
Calcium: 9.3 mg/dL (ref 8.7–10.2)
Chloride: 104 mmol/L (ref 96–106)
Creatinine, Ser: 0.94 mg/dL (ref 0.76–1.27)
Globulin, Total: 2.7 g/dL (ref 1.5–4.5)
Glucose: 183 mg/dL — ABNORMAL HIGH (ref 70–99)
Potassium: 5 mmol/L (ref 3.5–5.2)
Sodium: 142 mmol/L (ref 134–144)
Total Protein: 7.1 g/dL (ref 6.0–8.5)
eGFR: 103 mL/min/{1.73_m2} (ref >59)

## 2021-08-03 ENCOUNTER — Ambulatory Visit: Payer: Medicaid Other | Admitting: Family Medicine

## 2021-08-03 ENCOUNTER — Other Ambulatory Visit: Payer: Self-pay

## 2021-08-03 DIAGNOSIS — Z23 Encounter for immunization: Secondary | ICD-10-CM

## 2021-08-03 DIAGNOSIS — R300 Dysuria: Secondary | ICD-10-CM

## 2021-08-03 DIAGNOSIS — Z0000 Encounter for general adult medical examination without abnormal findings: Secondary | ICD-10-CM

## 2021-08-03 DIAGNOSIS — M545 Low back pain, unspecified: Secondary | ICD-10-CM | POA: Diagnosis not present

## 2021-08-03 DIAGNOSIS — H903 Sensorineural hearing loss, bilateral: Secondary | ICD-10-CM

## 2021-08-03 DIAGNOSIS — G8929 Other chronic pain: Secondary | ICD-10-CM

## 2021-08-03 DIAGNOSIS — H9190 Unspecified hearing loss, unspecified ear: Secondary | ICD-10-CM

## 2021-08-03 LAB — POCT UA - MICROSCOPIC ONLY

## 2021-08-03 MED ORDER — DICLOFENAC SODIUM 3 % EX GEL: 2.0000 g | CUTANEOUS | 0 refills | Status: DC | PRN
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
## 2021-08-03 NOTE — Patient Instructions (Addendum)
°??? ?????? ?????? ?????. ??? ?? ????? ?????. ?????? ????? ???? ????. ?? ??????? ?? ???? ????? ????? / ?????. ???? ??????????? ??????? ??????? ???????? ????? ??????????. ° °????? ?? ??? ??? ???? ?????? ?? ????? ° °??? ??? ??????? ??? ??? ????? ?????? ???????? ° °???? ???????? ??? ??? ?? ????? ???? ????? ° °??? ??? ???? ?? ????? ?? ????????? ? ?? ????? ?? ??????? ??????? ??? (  336) [id number].  ???? ?????????  ????? ?????    Thank you for coming to see me today. It was a pleasure. Today we discussed your back pain. It is likely muscular/nerve problem. I recommend tylenol, ice, heat therapy and diclofenac gel.  I will call you if you have a urine infection  I have referred you to ENT  Please follow-up with me if back pain does not improve  If you have any questions or concerns, please do not hesitate to call the office at [phone].  Best wishes,   Dr Allena Katz

## 2021-08-08 ENCOUNTER — Ambulatory Visit: Payer: Medicaid Other

## 2021-08-09 DIAGNOSIS — Z0000 Encounter for general adult medical examination without abnormal findings: Secondary | ICD-10-CM | POA: Insufficient documentation

## 2021-08-09 NOTE — Assessment & Plan Note (Addendum)
Likely acute worsening of pre-existing back pain. No red flags on exam today. UA negative for UTI and low suspicion for pyelonephritis. Recommended tylenol, ice, heat therapy and diclofenac gel. Recommended to avoid NSAIDs due to epigastric pain and possibility of gastritis/PUD-pending GI evaluation. F/u as needed. If no improvement would consider referral to PT.

## 2021-08-09 NOTE — Assessment & Plan Note (Signed)
Pt has lost hearing aids and would like another audiology referral. Placed referral.

## 2021-08-09 NOTE — Assessment & Plan Note (Signed)
Received flu vaccine today

## 2021-08-14 ENCOUNTER — Ambulatory Visit: Payer: Medicaid Other | Admitting: Audiologist

## 2021-08-16 ENCOUNTER — Ambulatory Visit: Payer: Medicaid Other | Admitting: Audiologist

## 2021-08-17 ENCOUNTER — Ambulatory Visit: Payer: Medicaid Other | Attending: Family Medicine | Admitting: Audiologist

## 2021-08-17 ENCOUNTER — Other Ambulatory Visit: Payer: Self-pay

## 2021-08-17 ENCOUNTER — Ambulatory Visit: Payer: Medicaid Other

## 2021-08-17 DIAGNOSIS — H905 Unspecified sensorineural hearing loss: Secondary | ICD-10-CM | POA: Insufficient documentation
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
## 2021-08-29 DIAGNOSIS — Z974 Presence of external hearing-aid: Secondary | ICD-10-CM | POA: Insufficient documentation

## 2021-08-30 ENCOUNTER — Other Ambulatory Visit: Payer: Self-pay

## 2021-08-30 ENCOUNTER — Ambulatory Visit (INDEPENDENT_AMBULATORY_CARE_PROVIDER_SITE_OTHER): Payer: Medicaid Other | Admitting: Family Medicine

## 2021-08-30 DIAGNOSIS — E119 Type 2 diabetes mellitus without complications: Secondary | ICD-10-CM

## 2021-08-30 DIAGNOSIS — R1013 Epigastric pain: Secondary | ICD-10-CM | POA: Diagnosis not present

## 2021-08-30 DIAGNOSIS — F339 Major depressive disorder, recurrent, unspecified: Secondary | ICD-10-CM | POA: Diagnosis not present

## 2021-08-30 MED ORDER — METFORMIN HCL ER 500 MG PO TB24: 1000.0000 mg | ORAL_TABLET | Freq: Every day | ORAL | 3 refills | Status: DC

## 2021-08-30 MED ORDER — PANTOPRAZOLE SODIUM 40 MG PO TBEC: 40.0000 mg | DELAYED_RELEASE_TABLET | Freq: Two times a day (BID) | ORAL | 3 refills | Status: DC

## 2021-08-30 MED ORDER — ROSUVASTATIN CALCIUM 20 MG PO TABS: 20.0000 mg | ORAL_TABLET | Freq: Every day | ORAL | 3 refills | Status: DC

## 2021-08-30 MED ORDER — ASPIRIN 81 MG PO CHEW: 81.0000 mg | CHEWABLE_TABLET | Freq: Every day | ORAL | 3 refills | Status: DC

## 2021-08-30 MED ORDER — FLUTICASONE PROPIONATE 50 MCG/ACT NA SUSP: 2.0000 | Freq: Every day | NASAL | 6 refills | Status: DC

## 2021-08-30 MED ORDER — GABAPENTIN 300 MG PO CAPS: 300.0000 mg | ORAL_CAPSULE | Freq: Every day | ORAL | 0 refills | Status: DC

## 2021-08-30 MED ORDER — QUETIAPINE FUMARATE 25 MG PO TABS: 25.0000 mg | ORAL_TABLET | Freq: Every day | ORAL | 3 refills | Status: DC

## 2021-08-30 NOTE — Patient Instructions (Signed)
It was wonderful seeing you today.  I refilled the medications requested and added refills to those medications.  I would like for you to see Dr. Manson Passey in our refugee clinic on 3/28.  That appointment has been scheduled.  If you have any issues, questions, concerns between now and then please give [place] a call.  I hope you have a wonderful day! ?

## 2021-08-30 NOTE — Progress Notes (Signed)
? ? ?  SUBJECTIVE:  ? ?CHIEF COMPLAINT / HPI:  ?A video interpreter was used throughout the entire encounter ? ?Epigastric pain ?Patient presents today because he reports his epigastric pain which radiates up into his chest has gotten worse.  He reports that he has been out of his medications for 2 days and since he is run out his symptoms have returned.  Feels what he describes as tightness which is the same feeling he has always felt there.  No changes in his symptoms since being evaluated last.  He would like refills on certain medications. ? ?Anxiety/PTSD ?Patient reports that his anxiety is doing poorly and he would like to see a psychiatrist.  He has followed with a psychiatrist in the past reports that he missed too many visits and they told him he was no longer going to be seen there. ? ? ?OBJECTIVE:  ? ?BP 108/72   Pulse 87   Ht 5\' 5"  (1.651 m)   Wt 144 lb (65.3 kg)   SpO2 98%   BMI 23.96 kg/m?   ?General: Anxious 46 year old male ?Cardiac: Regular rate and rhythm, no murmurs appreciated ?Respiratory: Normal work of breathing, speaking in full sentences, no wheezes or rhonchi appreciated to light abdomen: Soft, nontender, patient palpates just below his xiphoid process when asked where he experiences the pain ?MSK: No gross abnormalities ? ? ?ASSESSMENT/PLAN:  ? ?Major depressive disorder, recurrent episode with anxious distress ([place]) ?Provided refills on patient's Seroquel.  Discussed establishing him with a new psychiatrist but he will wait and speak with Dr. Owens Shark and refugee clinic to help set that up ?Patient has been scheduled for an appointment with refugee clinic. ? ?Epigastric pain ?Similar epigastric pain to previous evaluations.  Most likely related to acid reflux.  Patient does have pending referral for GI appointment.  Patient has been out of his PPI for several days.  Refill sent for patient's medications.  Strict ER and return precautions given. ? ?Gifford Shave, MD ?[facility]  ? ?

## 2021-08-31 ENCOUNTER — Encounter: Payer: Self-pay | Admitting: Family Medicine

## 2021-08-31 NOTE — Assessment & Plan Note (Signed)
Provided refills on patient's Seroquel.  Discussed establishing him with a new psychiatrist but he will wait and speak with Dr. Manson Passey and refugee clinic to help set that up ?Patient has been scheduled for an appointment with refugee clinic. ?

## 2021-08-31 NOTE — Assessment & Plan Note (Signed)
Similar epigastric pain to previous evaluations.  Most likely related to acid reflux.  Patient does have pending referral for GI appointment.  Patient has been out of his PPI for several days.  Refill sent for patient's medications.  Strict ER and return precautions given. ?

## 2021-09-12 ENCOUNTER — Other Ambulatory Visit: Payer: Self-pay

## 2021-09-12 ENCOUNTER — Ambulatory Visit: Payer: Medicaid Other | Admitting: Family Medicine

## 2021-09-12 VITALS — BP 112/78 | HR 71 | Ht 65.0 in | Wt 143.6 lb

## 2021-09-12 DIAGNOSIS — E119 Type 2 diabetes mellitus without complications: Secondary | ICD-10-CM | POA: Diagnosis not present

## 2021-09-12 DIAGNOSIS — F4310 Post-traumatic stress disorder, unspecified: Secondary | ICD-10-CM

## 2021-09-12 DIAGNOSIS — R531 Weakness: Secondary | ICD-10-CM | POA: Diagnosis not present

## 2021-09-12 DIAGNOSIS — H903 Sensorineural hearing loss, bilateral: Secondary | ICD-10-CM

## 2021-09-12 NOTE — Assessment & Plan Note (Signed)
Has follow-up with Dr. Consuello Closs for hearing aids in coming months. ?
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
## 2021-09-12 NOTE — Assessment & Plan Note (Signed)
This is severe and significantly affect the patient.  He has follow-up with psychiatry scheduled.  Also schedule him close follow-up with refugee clinic to review his medications.  Given the severity of his PTSD symptoms and multiple complaints and complex conditions including underlying diabetes have scheduled him follow-up in refugee clinic.  I think ongoing care with extended time and live interpreters will be best for the patient's care. ?

## 2021-09-12 NOTE — Patient Instructions (Addendum)
?  It was wonderful to see you today. ? ?Please bring ALL of your medications with you to every visit.  ? ?Today we talked about: ?[place] ?Address: [address], [place], [place] [address] ?Phone: [phone] ? ? ?Gastroenterology  ?Address: [address], [place], [place] [address] ?Phone: [phone]  ? ? ?Thank you for choosing [facility].  ? ?Please call [phone] with any questions about today's appointment. ? ?Please be sure to schedule follow up at the front  desk before you leave today.  ? ?Dorris Singh, MD  ?Family Medicine  ? ? ? ? ?

## 2021-09-12 NOTE — Assessment & Plan Note (Signed)
The patient reports some ongoing leg weakness.  Considered side effect of statin, B12 deficiency and anemia.  The patient declined labs today we discussed and given the duration and mild symptoms agreed that we would obtain these labs at next visit ? ?

## 2021-09-13 ENCOUNTER — Encounter: Payer: Self-pay | Admitting: Family Medicine

## 2021-09-13 ENCOUNTER — Telehealth: Payer: Self-pay | Admitting: Family Medicine

## 2021-09-13 LAB — MICROALBUMIN / CREATININE URINE RATIO
Creatinine, Urine: 136.4 mg/dL
Microalb/Creat Ratio: 10 mg/g creat (ref 0–29)
Microalbumin, Urine: 13.1 ug/mL

## 2021-09-13 NOTE — Telephone Encounter (Signed)
V784 completed. Reviewed, completed, and signed form.  Note routed to RN team inbasket and placed completed form in RN Wall pocket in the front office. Please make copy for chart.  Westley Chandler, MD ? ?

## 2021-09-14 ENCOUNTER — Emergency Department (HOSPITAL_COMMUNITY)
Admission: EM | Admit: 2021-09-14 | Discharge: 2021-09-14 | Disposition: A | Discharge status: Home / Self Care | Payer: Medicaid Other | Attending: Emergency Medicine | Admitting: Emergency Medicine

## 2021-09-14 ENCOUNTER — Encounter (HOSPITAL_COMMUNITY): Payer: Self-pay | Admitting: Emergency Medicine

## 2021-09-14 ENCOUNTER — Emergency Department (HOSPITAL_COMMUNITY): Payer: Medicaid Other

## 2021-09-14 ENCOUNTER — Other Ambulatory Visit: Payer: Medicaid Other

## 2021-09-14 ENCOUNTER — Other Ambulatory Visit: Payer: Self-pay

## 2021-09-14 DIAGNOSIS — Z7982 Long term (current) use of aspirin: Secondary | ICD-10-CM | POA: Diagnosis not present

## 2021-09-14 DIAGNOSIS — Z7984 Long term (current) use of oral hypoglycemic drugs: Secondary | ICD-10-CM | POA: Insufficient documentation

## 2021-09-14 DIAGNOSIS — E1165 Type 2 diabetes mellitus with hyperglycemia: Secondary | ICD-10-CM | POA: Insufficient documentation

## 2021-09-14 DIAGNOSIS — R112 Nausea with vomiting, unspecified: Secondary | ICD-10-CM | POA: Insufficient documentation

## 2021-09-14 DIAGNOSIS — R072 Precordial pain: Secondary | ICD-10-CM | POA: Diagnosis not present

## 2021-09-14 DIAGNOSIS — R739 Hyperglycemia, unspecified: Secondary | ICD-10-CM

## 2021-09-14 DIAGNOSIS — R1032 Left lower quadrant pain: Secondary | ICD-10-CM | POA: Diagnosis not present

## 2021-09-14 DIAGNOSIS — N509 Disorder of male genital organs, unspecified: Secondary | ICD-10-CM | POA: Insufficient documentation

## 2021-09-14 LAB — CBC
HCT: 43.6 % (ref 39.0–52.0)
Hemoglobin: 15.3 g/dL (ref 13.0–17.0)
MCH: 28.8 pg (ref 26.0–34.0)
MCHC: 35.1 g/dL (ref 30.0–36.0)
MCV: 82.1 fL (ref 80.0–100.0)
Platelets: 254 10*3/uL (ref 150–400)
RBC: 5.31 MIL/uL (ref 4.22–5.81)
RDW: 12.3 % (ref 11.5–15.5)
WBC: 7.5 10*3/uL (ref 4.0–10.5)
nRBC: 0 % (ref 0.0–0.2)

## 2021-09-14 LAB — COMPREHENSIVE METABOLIC PANEL
ALT: 31 U/L (ref 0–44)
AST: 23 U/L (ref 15–41)
Albumin: 3.7 g/dL (ref 3.5–5.0)
Alkaline Phosphatase: 90 U/L (ref 38–126)
Anion gap: 10 (ref 5–15)
BUN: 18 mg/dL (ref 6–20)
CO2: 20 mmol/L — ABNORMAL LOW (ref 22–32)
Calcium: 8.8 mg/dL — ABNORMAL LOW (ref 8.9–10.3)
Chloride: 104 mmol/L (ref 98–111)
Creatinine, Ser: 1 mg/dL (ref 0.61–1.24)
GFR, Estimated: >60 mL/min (ref >60)
Glucose, Bld: 359 mg/dL — ABNORMAL HIGH (ref 70–99)
Potassium: 4.2 mmol/L (ref 3.5–5.1)
Sodium: 134 mmol/L — ABNORMAL LOW (ref 135–145)
Total Bilirubin: 0.5 mg/dL (ref 0.3–1.2)
Total Protein: 6.6 g/dL (ref 6.5–8.1)

## 2021-09-14 LAB — URINALYSIS, ROUTINE W REFLEX MICROSCOPIC
Bilirubin Urine: NEGATIVE
Glucose, UA: >=500 mg/dL — AB
Ketones, ur: NEGATIVE mg/dL
Leukocytes,Ua: NEGATIVE
Nitrite: NEGATIVE
Protein, ur: NEGATIVE mg/dL
Specific Gravity, Urine: 1.015 (ref 1.005–1.030)
pH: 5.5 (ref 5.0–8.0)

## 2021-09-14 LAB — URINALYSIS, MICROSCOPIC (REFLEX): Bacteria, UA: NONE SEEN

## 2021-09-14 LAB — CBG MONITORING, ED
Glucose-Capillary: 355 mg/dL — ABNORMAL HIGH (ref 70–99)
Glucose-Capillary: 175 mg/dL — ABNORMAL HIGH (ref 70–99)

## 2021-09-14 LAB — TROPONIN I (HIGH SENSITIVITY)
Troponin I (High Sensitivity): 7 ng/L (ref <18)
Troponin I (High Sensitivity): 7 ng/L (ref <18)

## 2021-09-14 IMAGING — DX DG CHEST 1V PORT
1 series · 1 of 1 positions shown · non-contrast
Comparison: Chest radiograph dated [DATE].

CLINICAL DATA: Chest pain.

EXAM:
PORTABLE CHEST 1 VIEW

[chest ap]
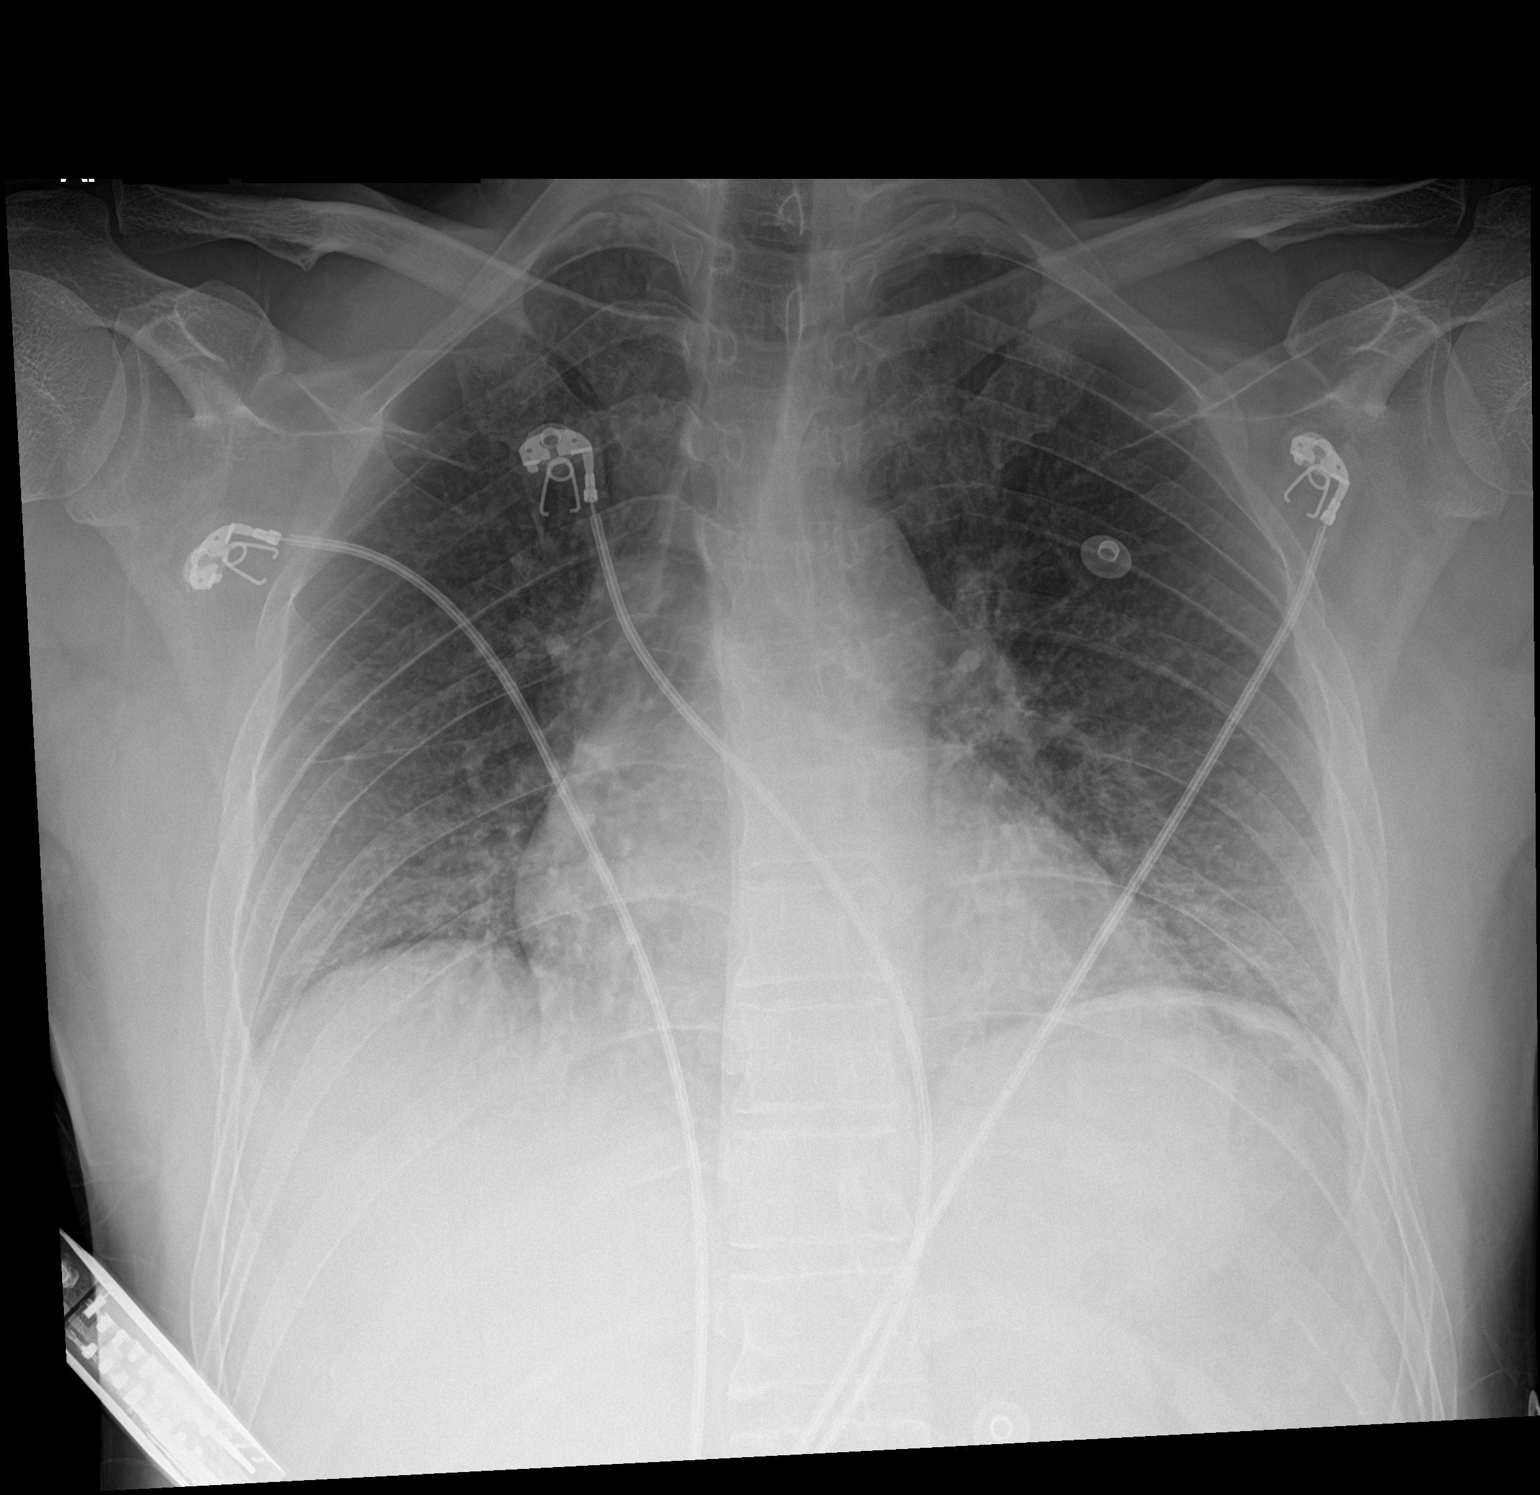

[1 of 1 positions shown; findings below may reference images not displayed]

FINDINGS: No focal consolidation, pleural effusion, or pneumothorax.
Borderline cardiomegaly with mild central vascular congestion. No
acute osseous pathology.
IMPRESSION: Borderline cardiomegaly with mild central vascular congestion. No
focal consolidation.

## 2021-09-14 MED ORDER — ONDANSETRON HCL 4 MG/2ML IJ SOLN
4.0000 mg | Freq: Once | INTRAMUSCULAR | Status: AC
  Administered 2021-09-14: 4 mg via INTRAVENOUS
  Filled 2021-09-14: qty 2

## 2021-09-14 MED ORDER — LACTATED RINGERS IV BOLUS
1000.0000 mL | Freq: Once | INTRAVENOUS | Status: AC
  Administered 2021-09-14: 1000 mL via INTRAVENOUS

## 2021-09-14 MED ORDER — FENTANYL CITRATE PF 50 MCG/ML IJ SOSY
100.0000 ug | PREFILLED_SYRINGE | Freq: Once | INTRAMUSCULAR | Status: AC
  Administered 2021-09-14: 100 ug via INTRAVENOUS
  Filled 2021-09-14: qty 2

## 2021-09-14 MED ORDER — IOHEXOL 300 MG/ML  SOLN
100.0000 mL | Freq: Once | INTRAMUSCULAR | Status: AC | PRN
  Administered 2021-09-14: 100 mL via INTRAVENOUS

## 2021-09-14 MED ORDER — SODIUM CHLORIDE 0.9 % IV BOLUS (SEPSIS)
1000.0000 mL | Freq: Once | INTRAVENOUS | Status: AC
  Administered 2021-09-14: 1000 mL via INTRAVENOUS

## 2021-09-14 MED ORDER — KETOROLAC TROMETHAMINE 30 MG/ML IJ SOLN
30.0000 mg | Freq: Once | INTRAMUSCULAR | Status: AC
Start: 2021-09-14 — End: 2021-09-14
  Administered 2021-09-14: 30 mg via INTRAVENOUS
  Filled 2021-09-14: qty 1

## 2021-09-14 NOTE — Telephone Encounter (Signed)
Patient contacted with Arabic interpreter and advised of [phone] forms ready for pick up. ? ?A copy was made for batch scanning.  ?

## 2021-09-14 NOTE — ED Triage Notes (Signed)
BIB [facility] from home. Sudden onset CP and abd pain. HX stemi. N no vomit-4 zofran with EMS. 4 ASA given by family. Vitals WNL. Speak arabic. ?

## 2021-09-14 NOTE — ED Provider Notes (Signed)
?[facility] EMERGENCY DEPARTMENT ?Provider Note ? ? ?CSN: [id number] ?Arrival date & time: 09/14/21  0030 ? ?  ? ?History ? ?Chief Complaint - abdominal pain ? ?Aaron Reyes is a 46 y.o. male. ? ?The history is provided by the patient. The history is limited by a language barrier. A language interpreter was used (arabic - [phone]).  ?Abdominal Pain ?Pain location:  LLQ ?Pain quality: aching   ?Pain severity:  Severe ?Onset quality:  Gradual ?Duration:  3 days ?Timing:  Constant ?Progression:  Worsening ?Chronicity:  New ?Context: not trauma   ?Relieved by:  Nothing ?Worsened by:  Movement, palpation and bowel movements ?Associated symptoms: chest pain, nausea and vomiting   ?Associated symptoms: no diarrhea and no dysuria   ?Patient presents with abdominal pain.  He reports left lower quadrant abdominal pain for the past 3 days.  He reports it is worse with palpation as well as defecation.  He has had nausea and vomiting, but no diarrhea.  No recent trauma.  No dysuria. ? ?He also reports he gets frequent episodes of chest pain that appear to be worse with exertion.  He has had this ongoing for several years. ? ?He also reports he has a lesion in his testicles that he would like for me to evaluate.  He has had this for several years ?He reports previous right hernia repair ?Past Medical History:  ?Diagnosis Date  ? Bilateral sensorineural hearing loss 02/07/2017  ? Cervicalgia 10/19/2016  ? Closed fracture of body of sternum 06/09/2018  ? Diabetes mellitus without complication (HCC)   ? per pt report  ? Food insecurity 01/02/2019  ? GERD (gastroesophageal reflux disease)   ? Hypercholesteremia   ? per pt report  ? Kidney stone   ? Myocardial infarct [facility])   ? Non-cardiac chest pain   ? 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 PTSD (post-traumatic stress disorder)   ? Recent unintentional weight loss over several months 10/25/2019  ? S/P cardiac catheterization 03/2015  ? cath in  [place] and [place], no records,  cath at [facility] normal coronary arteries and normal LV function.  ? Testicular mass 03/18/2015  ? ? ?Home Medications ?Prior to Admission medications   ?Medication Sig Start Date End Date Taking? Authorizing Provider  ?acetaminophen (TYLENOL) 500 MG tablet Take 1,000 mg by mouth every 6 (six) hours as needed for moderate pain or headache.   Yes [provider]  ?albuterol (VENTOLIN HFA) 108 (90 Base) MCG/ACT inhaler Inhale 1-2 puffs into the lungs every 6 (six) hours as needed for wheezing or shortness of breath. 12/07/20  Yes Covington, Sarah M, PA-C  ?aspirin 81 MG chewable tablet Chew 1 tablet (81 mg total) by mouth daily. 08/30/21  Yes Derrel Nip, MD  ?fluticasone (FLONASE) 50 MCG/ACT nasal spray Place 2 sprays into both nostrils daily. ?Patient taking differently: Place 2 sprays into both nostrils at bedtime. 08/30/21  Yes Derrel Nip, MD  ?gabapentin (NEURONTIN) 300 MG capsule Take 1 capsule (300 mg total) by mouth at bedtime. 08/30/21 09/29/21 Yes Derrel Nip, MD  ?metFORMIN (GLUCOPHAGE-XR) 500 MG 24 hr tablet Take 2 tablets (1,000 mg total) by mouth daily with breakfast. 08/30/21  Yes Derrel Nip, MD  ?pantoprazole (PROTONIX) 40 MG tablet Take 1 tablet (40 mg total) by mouth 2 (two) times daily. 08/30/21  Yes Derrel Nip, MD  ?rosuvastatin (CRESTOR) 20 MG tablet Take 1 tablet (20 mg total) by mouth daily. 08/30/21  Yes Derrel Nip, MD  ?traZODone (DESYREL)  100 MG tablet Take 100 mg by mouth at bedtime. 07/24/21  Yes [provider]  ?busPIRone (BUSPAR) 10 MG tablet Take 1/2 tablet (5 mg total) by mouth 2 (two) times daily. ?Patient not taking: Reported on 01/31/2021 11/16/20   Maury Dus, MD  ?Diclofenac Sodium 3 % GEL Apply 2 g topically as needed. ?Patient not taking: Reported on 09/14/2021 08/03/21   Towanda Octave, MD  ?escitalopram (LEXAPRO) 10 MG tablet Take 1 tablet (10 mg total) by mouth daily. ?Patient not taking: Reported on  01/31/2021 09/08/20   Zena Amos, MD  ?LORazepam (ATIVAN) 0.5 MG tablet Take 1 tablet (0.5 mg total) by mouth 2 (two) times daily as needed for anxiety. ?Patient not taking: Reported on 01/31/2021 09/08/20   Zena Amos, MD  ?Multiple Vitamin (MULTIVITAMIN ADULT) TABS Take 1 tablet by mouth daily. ?Patient not taking: Reported on 09/14/2021 03/04/20   Melene Plan, MD  ?QUEtiapine (SEROQUEL) 25 MG tablet Take 1 tablet (25 mg total) by mouth at bedtime for 30 doses. 08/30/21 09/29/21  Derrel Nip, MD  ?   ? ?Allergies    ?Pork-derived products   ? ?Review of Systems   ?Review of Systems  ?Constitutional:   ?     " Felt feverish"  ?Cardiovascular:  Positive for chest pain.  ?Gastrointestinal:  Positive for abdominal pain, nausea and vomiting. Negative for diarrhea.  ?Genitourinary:  Negative for dysuria.  ?All other systems reviewed and are negative. ? ?Physical Exam ?Updated Vital Signs ?BP 121/90 (BP Location: Right Arm)   Pulse 72   Temp (!) 97.5 ?F (36.4 ?C) (Oral)   Resp 14   SpO2 96%  ?Physical Exam ?CONSTITUTIONAL: Well developed/well nourished, uncomfortable appearing and moaning ?HEAD: Normocephalic/atraumatic ?EYES: EOMI/PERRL ?ENMT: Mucous membranes moist ?NECK: supple no meningeal signs ?SPINE/BACK:entire spine nontender ?CV: S1/S2 noted, no murmurs/rubs/gallops noted ?LUNGS: Lungs are clear to auscultation bilaterally, no apparent distress ?ABDOMEN: soft, moderate LUQ tenderness, no rebound or guarding, bowel sounds noted throughout abdomen ?GU:no cva tenderness ?No hernias noted.  No testicular tenderness is noted.  Small nodule noted in the scrotum.  No overlying erythema or significant tenderness.  Chaperone present for exam ?No obvious perirectal abscesses are noted ?NEURO: Pt is awake/alert/appropriate, moves all extremitiesx4.  No facial droop.   ?EXTREMITIES: pulses normal/equal, full ROM ?SKIN: warm, color normal ?PSYCH: Anxious ? ?ED Results / Procedures / Treatments   ?Labs ?(all labs  ordered are listed, but only abnormal results are displayed) ?Labs Reviewed  ?COMPREHENSIVE METABOLIC PANEL - Abnormal; Notable for the following components:  ?    Result Value  ? Sodium 134 (*)   ? CO2 20 (*)   ? Glucose, Bld 359 (*)   ? Calcium 8.8 (*)   ? All other components within normal limits  ?URINALYSIS, ROUTINE W REFLEX MICROSCOPIC - Abnormal; Notable for the following components:  ? Glucose, UA >=500 (*)   ? Hgb urine dipstick TRACE (*)   ? All other components within normal limits  ?CBG MONITORING, ED - Abnormal; Notable for the following components:  ? Glucose-Capillary 355 (*)   ? All other components within normal limits  ?CBG MONITORING, ED - Abnormal; Notable for the following components:  ? Glucose-Capillary 175 (*)   ? All other components within normal limits  ?CBC  ?URINALYSIS, MICROSCOPIC (REFLEX)  ?TROPONIN I (HIGH SENSITIVITY)  ?TROPONIN I (HIGH SENSITIVITY)  ? ? ?EKG ?EKG Interpretation ? ?Date/Time:  Thursday September 14 2021 00:44:25 EDT ?Ventricular Rate:  86 ?PR Interval:  134 ?QRS Duration: 74 ?QT Interval:  330 ?QTC Calculation: 394 ?R Axis:   55 ?Text Interpretation: Normal sinus rhythm Possible Left atrial enlargement No significant change since last tracing Confirmed by Zadie RhineWickline, Berdena Cisek ([id number]) on 09/14/2021 12:47:17 AM ? ?Radiology ?CT ABDOMEN PELVIS W CONTRAST ? ?Result Date: 09/14/2021 ?CLINICAL DATA:  46 year old male with sudden onset chest and abdominal pain. EXAM: CT ABDOMEN AND PELVIS WITH CONTRAST TECHNIQUE: Multidetector CT imaging of the abdomen and pelvis was performed using the standard protocol following bolus administration of intravenous contrast. RADIATION DOSE REDUCTION: This exam was performed according to the departmental dose-optimization program which includes automated exposure control, adjustment of the mA and/or kV according to patient size and/or use of iterative reconstruction technique. CONTRAST:  100mL OMNIPAQUE IOHEXOL 300 MG/ML  SOLN COMPARISON:  CT Abdomen  and Pelvis 01/01/2018. Portable chest 0232 hours today. FINDINGS: Lower chest: Respiratory motion, but lung bases have cleared compared to 2019. No pericardial or pleural effusion. Hepatobiliary: Negative liver and

## 2021-09-14 NOTE — ED Notes (Signed)
CP and lower left quadrant pain into groin. ?

## 2021-09-15 ENCOUNTER — Encounter: Payer: Self-pay | Admitting: Nurse Practitioner

## 2021-09-15 ENCOUNTER — Telehealth: Payer: Self-pay | Admitting: Family Medicine

## 2021-09-15 LAB — VITAMIN B12: Vitamin B-12: 658 pg/mL (ref 232–1245)

## 2021-09-15 LAB — CK: Total CK: 102 U/L (ref 49–439)

## 2021-09-15 NOTE — Telephone Encounter (Signed)
Attempted to call patient. Reached voicemail, unable to leave message. Will send letter.  ? ?Terisa Starr, MD  ?Family Medicine Teaching Service  ? ?

## 2021-09-22 ENCOUNTER — Other Ambulatory Visit: Payer: Self-pay | Admitting: Family Medicine

## 2021-09-27 ENCOUNTER — Other Ambulatory Visit: Payer: Self-pay | Admitting: Family Medicine

## 2021-10-06 ENCOUNTER — Ambulatory Visit: Payer: Medicaid Other | Admitting: Nurse Practitioner

## 2021-10-06 ENCOUNTER — Telehealth: Payer: Self-pay | Admitting: Nurse Practitioner

## 2021-10-06 NOTE — Telephone Encounter (Signed)
Good Morning Aaron Reyes, ? ? ?Patient called to cancel NP appointment with you this morning at 10:30 with no reason given. ? ? ?Patient was rescheduled for 5/9 at 3:00. ?

## 2021-10-10 ENCOUNTER — Encounter (HOSPITAL_COMMUNITY): Payer: Medicaid Other | Admitting: Physician Assistant

## 2021-10-24 ENCOUNTER — Encounter: Payer: Self-pay | Admitting: Nurse Practitioner

## 2021-10-24 ENCOUNTER — Ambulatory Visit: Payer: Medicaid Other | Admitting: Nurse Practitioner

## 2021-10-24 VITALS — BP 108/68 | HR 76 | Ht 65.0 in | Wt 141.6 lb

## 2021-10-24 DIAGNOSIS — R1013 Epigastric pain: Secondary | ICD-10-CM | POA: Diagnosis not present

## 2021-10-24 DIAGNOSIS — K219 Gastro-esophageal reflux disease without esophagitis: Secondary | ICD-10-CM | POA: Diagnosis not present

## 2021-10-24 MED ORDER — DICYCLOMINE HCL 10 MG PO CAPS
10.0000 mg | ORAL_CAPSULE | Freq: Two times a day (BID) | ORAL | 1 refills | Status: DC | PRN
Start: 2021-10-24 — End: 2021-12-07

## 2021-10-24 NOTE — Patient Instructions (Addendum)
?? ??? ????   64 ????? ?? ??? ? ???? ?? ?????? ???? ???? ????? ??? 19 ? 25 ?????. ???? ???? ????? ?? 23.56 ??? / ??. ??? ??? ??? ???? ?????? ??????? ????? ? ????? ??????? ?? ???????? ?? ???? ??????? ??????? ????? ??. ?________________________________________________________ ? ???? ????? [facility] ?????? ??? ??????? MYCHART ??????? ?? ????? ?????? ??????? ?? ??????? ??? ???????. ????? ?????? ???????? ??????? ??? ?????? ? ?? ???? ????? ????? ??? ????? ?? ???? MYCHART ????? ???? ????? ?????? ?????? ??? ???????. ?????? ?????? ? 48 ???? ??? ????. ???? ???? ?? ??? ??????? ??? ???????. ?_______________________________________________________ ? ???? ?? ????? ???? ?????? ??????? ???????. ???? ????? ????????? ???????? ???? ????? ?? ?? ?????? ?????. ???? ??? ?????? ????? ????????? (??? ??? ?????? ???) ? ????? ??????? ??? ?? ??? ???????. ? ????? ?????? ?????????? 1 ????? ?????? ?
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
## 2021-10-25 ENCOUNTER — Encounter: Payer: Self-pay | Admitting: Nurse Practitioner

## 2021-10-25 NOTE — Progress Notes (Signed)
Attending Physician's Attestation   I have reviewed the chart.   I agree with the Advanced Practitioner's note, impression, and recommendations with any updates as below.    Shaili Donalson Mansouraty, MD [facility] Advanced [facility]oscopy Office # [phone]  

## 2021-10-26 ENCOUNTER — Telehealth: Payer: Self-pay

## 2021-10-26 ENCOUNTER — Other Ambulatory Visit: Payer: Self-pay | Admitting: Family Medicine

## 2021-10-26 MED ORDER — METFORMIN HCL ER 500 MG PO TB24: ORAL_TABLET | ORAL | 3 refills | Status: DC

## 2021-10-26 NOTE — Telephone Encounter (Signed)
Sent metformin to the pharmacy

## 2021-10-26 NOTE — Telephone Encounter (Signed)
Please resend Rx for Metformin. Pharmacy did not receive it in April. Sunday Spillers, CMA ? ?

## 2021-10-31 ENCOUNTER — Ambulatory Visit: Payer: Medicaid Other

## 2021-10-31 ENCOUNTER — Encounter (HOSPITAL_COMMUNITY): Payer: Medicaid Other | Admitting: Physician Assistant

## 2021-11-07 ENCOUNTER — Encounter: Payer: Self-pay | Admitting: Gastroenterology

## 2021-11-07 ENCOUNTER — Ambulatory Visit (AMBULATORY_SURGERY_CENTER): Payer: Medicaid Other | Admitting: Gastroenterology

## 2021-11-07 VITALS — BP 93/53 | HR 66 | Temp 97.3°F | Resp 14 | Ht 65.0 in | Wt 141.0 lb

## 2021-11-07 DIAGNOSIS — R1310 Dysphagia, unspecified: Secondary | ICD-10-CM

## 2021-11-07 DIAGNOSIS — K2970 Gastritis, unspecified, without bleeding: Secondary | ICD-10-CM | POA: Diagnosis not present

## 2021-11-07 DIAGNOSIS — B9681 Helicobacter pylori [H. pylori] as the cause of diseases classified elsewhere: Secondary | ICD-10-CM

## 2021-11-07 DIAGNOSIS — K229 Disease of esophagus, unspecified: Secondary | ICD-10-CM

## 2021-11-07 DIAGNOSIS — K2950 Unspecified chronic gastritis without bleeding: Secondary | ICD-10-CM

## 2021-11-07 DIAGNOSIS — R1013 Epigastric pain: Secondary | ICD-10-CM

## 2021-11-07 DIAGNOSIS — K219 Gastro-esophageal reflux disease without esophagitis: Secondary | ICD-10-CM

## 2021-11-07 HISTORY — PX: UPPER GASTROINTESTINAL ENDOSCOPY: SHX188

## 2021-11-07 MED ORDER — SODIUM CHLORIDE 0.9 % IV SOLN: 500.0000 mL | Freq: Once | INTRAVENOUS | Status: DC

## 2021-11-07 NOTE — Progress Notes (Signed)
Called to room to assist during endoscopic procedure.  Patient ID and intended procedure confirmed with present staff. Received instructions for my participation in the procedure from the performing physician.  
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
## 2021-11-07 NOTE — Progress Notes (Signed)
Pt non-responsive, VVS, Report to RN  °

## 2021-11-07 NOTE — Op Note (Signed)
[facility] Patient Name: Aaron Reyes Procedure Date: 11/07/2021 8:47 AM MRN: [id number] Endoscopist: Justice Britain , MD Age: 46 Referring MD:  Date of Birth: 04-06-1976 Gender: Male Account #: [id number] Procedure:                Upper GI endoscopy Indications:              Upper abdominal pain, Dysphagia Medicines:                Monitored Anesthesia Care Procedure:                Pre-Anesthesia Assessment:                           - Prior to the procedure, a History and Physical                            was performed, and patient medications and                            allergies were reviewed. The patient's tolerance of                            previous anesthesia was also reviewed. The risks                            and benefits of the procedure and the sedation                            options and risks were discussed with the patient.                            All questions were answered, and informed consent                            was obtained. Prior Anticoagulants: The patient has                            taken no previous anticoagulant or antiplatelet                            agents. ASA Grade Assessment: II - A patient with                            mild systemic disease. After reviewing the risks                            and benefits, the patient was deemed in                            satisfactory condition to undergo the procedure.                           After obtaining informed consent, the endoscope was  passed under direct vision. Throughout the                            procedure, the patient's blood pressure, pulse, and                            oxygen saturations were monitored continuously. The                            Endoscope was introduced through the mouth, and                            advanced to the second part of duodenum. The upper                            GI endoscopy was  accomplished without difficulty.                            The patient tolerated the procedure. Scope In: Scope Out: Findings:                 No gross lesions were noted in the entire                            esophagus. Biopsies were taken with a cold forceps                            for histology. A guidewire was placed and the scope                            was withdrawn. Dilation was performed with a Savary                            dilator with no resistance at 18 mm. The dilation                            site was examined following endoscope reinsertion                            and showed no change.                           The Z-line was irregular and was found 39 cm from                            the incisors.                           Segmental moderate inflammation characterized by                            erythema, friability and granularity was found in  the entire examined stomach. Biopsies were taken                            with a cold forceps for histology and Helicobacter                            pylori testing.                           No gross lesions were noted in the duodenal bulb,                            in the first portion of the duodenum and in the                            second portion of the duodenum. Biopsies were taken                            with a cold forceps for histology. Complications:            No immediate complications. Estimated Blood Loss:     Estimated blood loss was minimal. Impression:               - No gross lesions in esophagus. Biopsied. Dilated.                           - Z-line irregular, 39 cm from the incisors.                           - Gastritis. Biopsied.                           - No gross lesions in the duodenal bulb, in the                            first portion of the duodenum and in the second                            portion of the duodenum.  Biopsied. Recommendation:           - The patient will be observed post-procedure,                            until all discharge criteria are met.                           - Discharge patient to home.                           - Patient has a contact number available for                            emergencies. The signs and symptoms of potential  delayed complications were discussed with the                            patient. Return to normal activities tomorrow.                            Written discharge instructions were provided to the                            patient.                           - Dilation diet as per protocol.                           - Await pathology results.                           - Continue present medications.                           - Consider 1 time PPI transition.                           - Observe patient's clinical course.                           - Please use Cepacol or Halls Lozenges +/-                            Chloraseptic spray for next 72-96 hours to aid in                            sore thoat should you experience this.                           - If issues of dysphagia persist, then consider                            manometry.                           - The findings and recommendations were discussed                            with the patient.                           - The findings and recommendations were discussed                            with the patient's family. Justice Britain, MD 11/07/2021 10:28:38 AM

## 2021-11-07 NOTE — Patient Instructions (Signed)
Follow post dilation diet. Awaiting pathology results. Continue present medications.  Please use Cepacol or Halls Lozenges/Chloraseptic spray for the next 72-96 hours to help with a sore throat should you experience this..  YOU HAD AN ENDOSCOPIC PROCEDURE TODAY AT THE [facility]:   Refer to the procedure report that was given to you for any specific questions about what was found during the examination.  If the procedure report does not answer your questions, please call your gastroenterologist to clarify.  If you requested that your care partner not be given the details of your procedure findings, then the procedure report has been included in a sealed envelope for you to review at your convenience later.  YOU SHOULD EXPECT: Some feelings of bloating in the abdomen. Passage of more gas than usual.  Walking can help get rid of the air that was put into your GI tract during the procedure and reduce the bloating. If you had a lower endoscopy (such as a colonoscopy or flexible sigmoidoscopy) you may notice spotting of blood in your stool or on the toilet paper. If you underwent a bowel prep for your procedure, you may not have a normal bowel movement for a few days.  Please Note:  You might notice some irritation and congestion in your nose or some drainage.  This is from the oxygen used during your procedure.  There is no need for concern and it should clear up in a day or so.  SYMPTOMS TO REPORT IMMEDIATELY:  Following upper endoscopy (EGD)  Vomiting of blood or coffee ground material  New chest pain or pain under the shoulder blades  Painful or persistently difficult swallowing  New shortness of breath  Fever of 100F or higher  Black, tarry-looking stools  For urgent or emergent issues, a gastroenterologist can be reached at any hour by calling [phone]. Do not use MyChart messaging for urgent concerns.    DIET:  We do recommend a small meal at first, but then you may  proceed to your regular diet.  Drink plenty of fluids but you should avoid alcoholic beverages for 24 hours.  ACTIVITY:  You should plan to take it easy for the rest of today and you should NOT DRIVE or use heavy machinery until tomorrow (because of the sedation medicines used during the test).    FOLLOW UP: Our staff will call the number listed on your records 48-72 hours following your procedure to check on you and address any questions or concerns that you may have regarding the information given to you following your procedure. If we do not reach you, we will leave a message.  We will attempt to reach you two times.  During this call, we will ask if you have developed any symptoms of COVID 19. If you develop any symptoms (ie: fever, flu-like symptoms, shortness of breath, cough etc.) before then, please call [phone].  If you test positive for Covid 19 in the 2 weeks post procedure, please call and report this information to [place].    If any biopsies were taken you will be contacted by phone or by letter within the next 1-3 weeks.  Please call us at [phone] if you have not heard about the biopsies in 3 weeks.    SIGNATURES/CONFIDENTIALITY: You and/or your care partner have signed paperwork which will be entered into your electronic medical record.  These signatures attest to the fact that that the information above on your After Visit Summary has been reviewed  and is understood.  Full responsibility of the confidentiality of this discharge information lies with you and/or your care-partner.

## 2021-11-08 ENCOUNTER — Telehealth: Payer: Self-pay

## 2021-11-08 NOTE — Telephone Encounter (Signed)
Son interpreted.  Follow up Call-     11/07/2021    8:48 AM  Call back number  Post procedure Call Back phone  # [phone]-Interpreter or Gwenyth Bouillon son speaks english  Permission to leave phone message Yes     Patient questions:  Do you have a fever, pain , or abdominal swelling? Yes.   Pain Score  2 *  Have you tolerated food without any problems? Yes.    Have you been able to return to your normal activities? Yes.    Do you have any questions about your discharge instructions: Diet   No. Medications  No. Follow up visit  No.  Do you have questions or concerns about your Care? No.  Actions: * If pain score is 4 or above: No action needed, pain <4.

## 2021-11-11 ENCOUNTER — Encounter: Payer: Self-pay | Admitting: Gastroenterology

## 2021-11-14 ENCOUNTER — Other Ambulatory Visit: Payer: Self-pay

## 2021-11-14 MED ORDER — TALICIA 250-12.5-10 MG PO CPDR
4.0000 | DELAYED_RELEASE_CAPSULE | Freq: Three times a day (TID) | ORAL | 0 refills | Status: DC
Start: 2021-11-14 — End: 2022-06-27

## 2021-11-15 ENCOUNTER — Ambulatory Visit: Payer: Medicaid Other | Admitting: Family Medicine

## 2021-11-15 ENCOUNTER — Other Ambulatory Visit (HOSPITAL_COMMUNITY): Payer: Self-pay

## 2021-11-15 ENCOUNTER — Telehealth: Payer: Self-pay | Admitting: Pharmacy Technician

## 2021-11-15 NOTE — Telephone Encounter (Signed)
Patient Advocate Encounter Received notification from [place] regarding a prior authorization for [place]. Authorization has been APPROVED from 5.31.23 to 5.30.24.   Per test claim, copay for 14 days supply is $4  Authorization # PA Case ID: [id number]     Received notification from [place] that prior authorization for TALICIA is required.   PA submitted on 5.31.23 Key [id number]  Status is pending   [facility] will continue to follow  Luciano Cutter, CPhT Patient Advocate Phone: [phone]

## 2021-11-21 ENCOUNTER — Ambulatory Visit (INDEPENDENT_AMBULATORY_CARE_PROVIDER_SITE_OTHER): Payer: Medicaid Other | Admitting: Physician Assistant

## 2021-11-21 ENCOUNTER — Encounter (HOSPITAL_COMMUNITY): Payer: Self-pay | Admitting: Physician Assistant

## 2021-11-21 VITALS — BP 127/87 | HR 77 | Ht 65.0 in | Wt 140.0 lb

## 2021-11-21 DIAGNOSIS — R41840 Attention and concentration deficit: Secondary | ICD-10-CM

## 2021-11-21 DIAGNOSIS — F339 Major depressive disorder, recurrent, unspecified: Secondary | ICD-10-CM | POA: Diagnosis not present

## 2021-11-21 DIAGNOSIS — F4310 Post-traumatic stress disorder, unspecified: Secondary | ICD-10-CM | POA: Diagnosis not present

## 2021-11-21 MED ORDER — ESCITALOPRAM OXALATE 10 MG PO TABS: 10.0000 mg | ORAL_TABLET | Freq: Every day | ORAL | 2 refills | Status: DC

## 2021-11-21 MED ORDER — ATOMOXETINE HCL 40 MG PO CAPS: 40.0000 mg | ORAL_CAPSULE | Freq: Every day | ORAL | 2 refills | Status: DC

## 2021-11-21 MED ORDER — TRAZODONE HCL 50 MG PO TABS: 50.0000 mg | ORAL_TABLET | Freq: Every day | ORAL | 2 refills | Status: DC

## 2021-11-24 ENCOUNTER — Encounter (HOSPITAL_COMMUNITY): Payer: Self-pay | Admitting: Physician Assistant
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
## 2021-12-07 ENCOUNTER — Other Ambulatory Visit: Payer: Medicaid Other

## 2021-12-07 ENCOUNTER — Ambulatory Visit: Payer: Medicaid Other | Admitting: Nurse Practitioner

## 2021-12-07 ENCOUNTER — Encounter: Payer: Self-pay | Admitting: Nurse Practitioner

## 2021-12-07 VITALS — BP 110/80 | HR 84 | Ht 64.25 in | Wt 140.5 lb

## 2021-12-07 DIAGNOSIS — A048 Other specified bacterial intestinal infections: Secondary | ICD-10-CM | POA: Diagnosis not present

## 2021-12-07 DIAGNOSIS — R1010 Upper abdominal pain, unspecified: Secondary | ICD-10-CM | POA: Diagnosis not present

## 2021-12-07 MED ORDER — DICYCLOMINE HCL 10 MG PO CAPS: 10.0000 mg | ORAL_CAPSULE | Freq: Two times a day (BID) | ORAL | 3 refills | Status: DC | PRN

## 2021-12-07 NOTE — Patient Instructions (Addendum)
Start Talicia. Complete the complete prescription. For now do not take the pantoprazole that you normally take at home   Will need follow testing to check for H.pylori two weeks after completion of Talicia .This will be done by stool specimen     If you are age 44 or older, your body mass index should be between 23-30. Your Body mass index is 23.93 kg/m. If this is out of the aforementioned range listed, please consider follow up with your Primary Care Provider.  If you are age 13 or younger, your body mass index should be between 19-25. Your Body mass index is 23.93 kg/m. If this is out of the aformentioned range listed, please consider follow up with your Primary Care Provider.   ________________________________________________________  The [facility] providers would like to encourage you to use [facility] to communicate with providers for non-urgent requests or questions.  Due to long hold times on the telephone, sending your provider a message by [facility] may be a faster and more efficient way to get a response.  Please allow 48 business hours for a response.  Please remember that this is for non-urgent requests.  _______________________________________________________  Your provider has requested that you go to the basement level for lab work before leaving today. Press "B" on the elevator. The lab is located at the first door on the left as you exit the elevator.  Due to recent changes in healthcare laws, you may see the results of your imaging and laboratory studies on MyChart before your provider has had a chance to review them.  We understand that in some cases there may be results that are confusing or concerning to you. Not all laboratory results come back in the same time frame and the provider may be waiting for multiple results in order to interpret others.  Please give [place] 48 hours in order for your provider to thoroughly review all the results before contacting the office for  clarification of your results.    It was a pleasure to see you today!  Thank you for trusting me with your gastrointestinal care!

## 2021-12-07 NOTE — Progress Notes (Signed)
Chief Complaint: Follow-up on abdominal pain   Assessment &  Plan   46 year old male from [place] with [place] significant for diabetes, head trauma, diminished hearing, PTSD, kidney stones, H.pylori gastritis and GERD  # Chronic epigastric pain / H.pylori gastritis but abdominal also may also have a functional component  Start and complete Talicia as previously prescribed. Stop pantoprazole for now Pat Kocher contains a PPI).   Will need follow testing to check for eradication of H.pylori two weeks after completion of Talicia .  Will refill Bentyl per his request   # GERD, better with pantoprazole. Will resume pantoprazole after H.pylori eradication testing  HPI   Here with an interpreter   Patient established care here early May for evaluation of chronic epigastric pain.  CT scan and labs unrevealing.  Pain was suspected to be functional. He was scheduled for, and underwent an upper endoscopy which showed gastritis but was otherwise normal.  We contacted the patient about H.pylori infection, sent a prescription to the pharmacy for [place].  He was given a follow-up appointment for today.   Interval History:  He did not start the [place]. He didn't know if the prescription was from our office nor did he know how to take it. He has continued to take bid pantoprazole. He continues to have spasm like upper abdominal discomfort but says bentyl helps and wants a refill.   Previous GI Evaluation   11/07/21 EGD  - No gross lesions in esophagus. Biopsied. Dilated. - Z-line irregular, 39 cm from the incisors. - Gastritis. Biopsied. - No gross lesions in the duodenal bulb, in the first portion of the duodenum and in the second portion of the duodenum. Biopsied  Diagnosis 1. Surgical [P], duodenum biopsies FRAGMENTS OF NORMAL DUODENAL MUCOSA. THERE ARE NO DIAGNOSTIC FEATURES OF CELIAC DISEASE. 2. Surgical [P], gastric biopsies gastritis CHRONIC EROSIVE GASTRITIS. ABUNDANT H. PYLORI ARE  PRESENT (CONFIRMED BY IMMUNOSTAIN). NEGATIVE FOR DYSPLASIA AND MALIGNANCY. THE FOLLOWING IMMUNOSTAIN IS PERFORMED WITH APPROPRIATE CONTROLS: H. PYLORI: POSITIVE. 3. Surgical [P], esophagus biopsies FRAGMENTS OF NORMAL ESOPHAGEAL MUCOSA. THERE ARE NO DIAGNOSTIC FEATURES OF CELIAC DISEASE   Labs:     Latest Ref Rng & Units 09/14/2021   12:55 AM 07/21/2021    4:16 PM 01/30/2021   11:44 PM  CBC  WBC 4.0 - 10.5 K/uL 7.5  8.0  10.8   Hemoglobin 13.0 - 17.0 g/dL 15.3  15.8  16.0   Hematocrit 39.0 - 52.0 % 43.6  46.6  46.2   Platelets 150 - 400 K/uL 254  278  338        Latest Ref Rng & Units 09/14/2021   12:55 AM 07/31/2021   10:44 AM 07/21/2021    4:16 PM  Hepatic Function  Total Protein 6.5 - 8.1 g/dL 6.6  7.1  7.1   Albumin 3.5 - 5.0 g/dL 3.7  4.4  4.6   AST 15 - 41 U/L 23  19  19    ALT 0 - 44 U/L 31  20  21    Alk Phosphatase 38 - 126 U/L 90  100  106   Total Bilirubin 0.3 - 1.2 mg/dL 0.5  0.4  0.2      Past Medical History:  Diagnosis Date   Bilateral sensorineural hearing loss 02/07/2017   Cervicalgia 10/19/2016   Closed fracture of body of sternum 06/09/2018   Diabetes mellitus without complication ([place])    per pt report   Food insecurity 01/02/2019   GERD (gastroesophageal reflux  disease)    Hypercholesteremia    per pt report   Kidney stone    Myocardial infarct [facility])    Non-cardiac chest pain    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   Testicular mass 03/18/2015    Past Surgical History:  Procedure Laterality Date   CARDIAC CATHETERIZATION     CARDIAC CATHETERIZATION     x 2   CARDIAC CATHETERIZATION N/A 04/11/2015   Procedure: Left Heart Cath and Coronary  Angiography;  Surgeon: Belva Crome, MD;  Location: [place] [facility];  Service: Cardiovascular;  Laterality: N/A;   HERNIA REPAIR     UPPER GASTROINTESTINAL ENDOSCOPY  11/07/2021    Current Medications, Allergies, Family History and Social History were reviewed in [employer] record.     Current Outpatient Medications  Medication Sig Dispense Refill   acetaminophen (TYLENOL) 500 MG tablet Take 1,000 mg by mouth every 6 (six) hours as needed for moderate pain or headache.     Amoxicill-Rifabutin-Omeprazole (TALICIA) 829-56.2-13 MG CPDR Take 4 capsules by mouth 3 (three) times daily. (Patient not taking: Reported on 12/07/2021) 168 capsule 0   aspirin 81 MG chewable tablet Chew 1 tablet (81 mg total) by mouth daily. 90 tablet 3   atomoxetine (STRATTERA) 40 MG capsule Take 1 capsule (40 mg total) by mouth daily. 30 capsule 2   dicyclomine (BENTYL) 10 MG capsule Take 1 capsule (10 mg total) by mouth 2 (two) times daily as needed for spasms. 30 capsule 1   escitalopram (LEXAPRO) 10 MG tablet Take 1 tablet (10 mg total) by mouth daily. 30 tablet 2   fluticasone (FLONASE) 50 MCG/ACT nasal spray Place 2 sprays into both nostrils daily.     gabapentin (NEURONTIN) 300 MG capsule TAKE 1 CAPSULE(300 MG) BY MOUTH AT BEDTIME 30 capsule 0   HYDROcodone-acetaminophen (NORCO) 10-325 MG tablet Take 1 tablet by mouth 4 (four) times daily.     metFORMIN (GLUCOPHAGE-XR) 500 MG 24 hr tablet TAKE 2 TABLETS(1000 MG) BY MOUTH DAILY WITH BREAKFAST 180 tablet 3   pantoprazole (PROTONIX) 40 MG tablet Take 1 tablet (40 mg total) by mouth 2 (two) times daily. 180 tablet 3   QUEtiapine (SEROQUEL) 25 MG tablet Take 1 tablet (25 mg total) by mouth at bedtime for 30 doses. 30 tablet 3   rosuvastatin (CRESTOR) 20 MG tablet Take 1 tablet (20 mg total) by mouth daily. 90 tablet 3   tiZANidine (ZANAFLEX) 4 MG tablet Take 4 mg by mouth 3 (three) times daily.     traZODone (DESYREL) 50 MG tablet Take 1  tablet (50 mg total) by mouth at bedtime. 30 tablet 2   No current facility-administered medications for this visit.    Review of Systems: No chest pain. No shortness of breath. No urinary complaints.    Physical Exam  Wt Readings from Last 3 Encounters:  12/07/21 140 lb 8 oz (63.7 kg)  11/07/21 141 lb (64 kg)  10/24/21 141 lb 9.6 oz (64.2 kg)    Ht 5' 4.25" (1.632 m) Comment: height measured without shoes  Wt 140 lb 8 oz (63.7 kg)   BMI 23.93 kg/m  Constitutional:  Generally well appearing thin male  in no acute distress. Psychiatric: Pleasant. Normal mood and affect. Behavior is normal. EENT: Pupils normal.  Conjunctivae are normal. No scleral icterus. Neck supple.  Cardiovascular: Normal rate, regular rhythm. No edema Pulmonary/chest: Effort normal and breath sounds normal. No wheezing, rales or rhonchi. Abdominal: Soft, nondistended, nontender. Bowel sounds active throughout. There are no masses palpable. No hepatomegaly. Neurological: Alert and oriented to person place and time. Skin: Skin is warm and dry. No rashes noted.  Tye Savoy, NP  12/07/2021, 9:21 AM

## 2022-01-09 ENCOUNTER — Other Ambulatory Visit: Payer: Medicaid Other

## 2022-01-09 DIAGNOSIS — R1010 Upper abdominal pain, unspecified: Secondary | ICD-10-CM

## 2022-01-09 DIAGNOSIS — A048 Other specified bacterial intestinal infections: Secondary | ICD-10-CM

## 2022-01-10 LAB — HELICOBACTER PYLORI  SPECIAL ANTIGEN
MICRO NUMBER:: 13691444
SPECIMEN QUALITY: ADEQUATE

## 2022-01-16 ENCOUNTER — Other Ambulatory Visit: Payer: Self-pay | Admitting: Family Medicine

## 2022-01-18 ENCOUNTER — Other Ambulatory Visit: Payer: Self-pay

## 2022-01-22 ENCOUNTER — Ambulatory Visit: Payer: Medicaid Other | Admitting: Family Medicine

## 2022-01-23 ENCOUNTER — Other Ambulatory Visit: Payer: Self-pay

## 2022-01-23 MED ORDER — DICYCLOMINE HCL 10 MG PO CAPS: 10.0000 mg | ORAL_CAPSULE | Freq: Two times a day (BID) | ORAL | 3 refills | Status: DC | PRN

## 2022-01-24 ENCOUNTER — Encounter (HOSPITAL_COMMUNITY): Payer: Medicaid Other | Admitting: Physician Assistant

## 2022-02-21 ENCOUNTER — Other Ambulatory Visit (HOSPITAL_COMMUNITY): Payer: Self-pay | Admitting: Physician Assistant

## 2022-02-21 ENCOUNTER — Other Ambulatory Visit: Payer: Self-pay | Admitting: Student

## 2022-02-21 DIAGNOSIS — F4310 Post-traumatic stress disorder, unspecified: Secondary | ICD-10-CM

## 2022-02-21 DIAGNOSIS — F339 Major depressive disorder, recurrent, unspecified: Secondary | ICD-10-CM

## 2022-02-21 DIAGNOSIS — R41840 Attention and concentration deficit: Secondary | ICD-10-CM

## 2022-03-05 ENCOUNTER — Telehealth (HOSPITAL_COMMUNITY): Payer: Self-pay | Admitting: *Deleted

## 2022-03-05 NOTE — Telephone Encounter (Signed)
Rx 2nd REFILL REQUEST -- escitalopram (LEXAPRO) 10 MG tablet

## 2022-03-07 ENCOUNTER — Telehealth (HOSPITAL_COMMUNITY): Payer: Self-pay | Admitting: *Deleted

## 2022-03-07 NOTE — Telephone Encounter (Signed)
Rx Refill Request-- atomoxetine (STRATTERA) 40 MG capsule Take 1 capsule (40 mg total) by mouth daily                           && traZODone (DESYREL) 50 MG tablet Take 1 tablet (50 mg total) by mouth at  bedtime

## 2022-03-21 ENCOUNTER — Ambulatory Visit: Payer: Medicaid Other | Admitting: Gastroenterology

## 2022-03-29 ENCOUNTER — Encounter: Payer: Self-pay | Admitting: Student

## 2022-03-29 ENCOUNTER — Ambulatory Visit: Payer: Medicaid Other | Admitting: Student

## 2022-03-29 VITALS — BP 119/80 | HR 82 | Ht 64.0 in | Wt 140.4 lb

## 2022-03-29 DIAGNOSIS — E119 Type 2 diabetes mellitus without complications: Secondary | ICD-10-CM

## 2022-03-29 DIAGNOSIS — Z23 Encounter for immunization: Secondary | ICD-10-CM | POA: Diagnosis not present

## 2022-03-29 DIAGNOSIS — H903 Sensorineural hearing loss, bilateral: Secondary | ICD-10-CM | POA: Diagnosis not present

## 2022-03-29 DIAGNOSIS — Z0000 Encounter for general adult medical examination without abnormal findings: Secondary | ICD-10-CM

## 2022-03-29 LAB — POCT GLYCOSYLATED HEMOGLOBIN (HGB A1C): HbA1c, POC (controlled diabetic range): 6.4 % (ref 0.0–7.0)

## 2022-03-29 MED ORDER — METFORMIN HCL ER 500 MG PO TB24
ORAL_TABLET | ORAL | 0 refills | Status: DC
Start: 2022-03-29 — End: 2022-05-01

## 2022-03-29 MED ORDER — PANTOPRAZOLE SODIUM 40 MG PO TBEC: 40.0000 mg | DELAYED_RELEASE_TABLET | Freq: Two times a day (BID) | ORAL | 0 refills | Status: DC

## 2022-03-29 MED ORDER — ASPIRIN 81 MG PO CHEW: 81.0000 mg | CHEWABLE_TABLET | Freq: Every day | ORAL | 0 refills | Status: DC

## 2022-03-29 MED ORDER — ROSUVASTATIN CALCIUM 20 MG PO TABS: 20.0000 mg | ORAL_TABLET | Freq: Every day | ORAL | 0 refills | Status: DC

## 2022-03-29 NOTE — Patient Instructions (Signed)
It was great to see you today! Thank you for choosing [facility] for your primary care. Deshaun [facility] was seen for diabetes.  Today we addressed: I have placed a referral to ophthalmology for an eye exam.  I need you to come back on 03/14/2022 for an appointment with myself so we can formally go over all of your medications.  Please bring all of them in including empty bottles. Your A1c was 6.4, please continue to take your medications as you are currently.  I have sent in medication refills for a 1 month supply.  If you haven't already, sign up for My Chart to have easy access to your labs results, and communication with your primary care physician.  I recommend that you always bring your medications to each appointment as this makes it easy to ensure you are on the correct medications and helps [place] not miss refills when you need them.  You should return to our clinic Return in about 15 days (around 04/13/2022) for Med rec with PCP. Please arrive 15 minutes before your appointment to ensure smooth check in process.  We appreciate your efforts in making this happen.  Thank you for allowing me to participate in your care, Wells Guiles, DO 03/29/2022, 10:26 AM PGY-2, [place]

## 2022-03-29 NOTE — Assessment & Plan Note (Signed)
Advised to call ENT office.  Will attempt to address this at next visit.

## 2022-03-29 NOTE — Progress Notes (Signed)
  SUBJECTIVE:   CHIEF COMPLAINT / HPI:   Type 2 Diabetes: Home medications include: Metformin 1000 mg daily.  Compliance unknown, patient unable to answer questions regarding which specific medications he is taking. States he is very fatigued, his legs hurt and his eyes are blurry.  States he has been unable to get many of his medications from the pharmacy yet he is taking them. Patient is not up to date on diabetic eye. Patient is not up to date on diabetic foot exam.   Hearing difficulty: States he went to see the hearing doctor 4 to 5 months ago and has continued to be told that the hearing aids are on their way.  States he hears constant ringing in his ears.  Arabic interpreter used throughout encounter.  PERTINENT  PMH / PSH: T2DM, HLD, tobacco use, MDD  OBJECTIVE:  BP 119/80   Pulse 82   Ht 5\' 4"  (1.626 m)   Wt 140 lb 6.4 oz (63.7 kg)   SpO2 100%   BMI 24.10 kg/m  Physical Exam Constitutional:      Appearance: Normal appearance.  Neurological:     Mental Status: He is alert.    ASSESSMENT/PLAN:  Type 2 diabetes mellitus without complication, without long-term current use of insulin (HCC) Assessment & Plan: stable - Last A1c: 6.4 - Medications: Metformin 1000 mg daily - Ophthalmology referral previously placed  Orders: -     POCT glycosylated hemoglobin (Hb A1C) -     Ambulatory referral to Ophthalmology  Healthcare maintenance Assessment & Plan: Advised patient that it would be greatly beneficial for him to come with all medications so that we may perform a complete med rec.  I will refill a 1 month supply of his current medications of aspirin, metformin, Protonix, Crestor.  Appointment made for 9/27.  Bilateral sensorineural hearing loss Assessment & Plan: Advised to call ENT office.  Will attempt to address this at next visit.  Need for immunization against influenza -     Flu Vaccine QUAD 105mo+IM (Fluarix, Fluzone & Alfiuria Quad PF)  Other orders -      metFORMIN HCl ER; TAKE 2 TABLETS(1000 MG) BY MOUTH DAILY WITH BREAKFAST  Dispense: 60 tablet; Refill: 0 -     Pantoprazole Sodium; Take 1 tablet (40 mg total) by mouth 2 (two) times daily.  Dispense: 60 tablet; Refill: 0 -     Rosuvastatin Calcium; Take 1 tablet (20 mg total) by mouth daily.  Dispense: 30 tablet; Refill: 0 -     Aspirin; Chew 1 tablet (81 mg total) by mouth daily.  Dispense: 30 tablet; Refill: 0    Return in about 15 days (around 04/13/2022) for Med rec with PCP. Wells Guiles, DO 03/29/2022, 1:23 PM PGY-2, [place]

## 2022-03-29 NOTE — Assessment & Plan Note (Signed)
Advised patient that it would be greatly beneficial for him to come with all medications so that we may perform a complete med rec.  I will refill a 1 month supply of his current medications of aspirin, metformin, Protonix, Crestor.  Appointment made for 9/27.

## 2022-03-29 NOTE — Assessment & Plan Note (Addendum)
stable - Last A1c: 6.4 - Medications: Metformin 1000 mg daily - Ophthalmology referral previously placed

## 2022-04-12 ENCOUNTER — Telehealth: Payer: Self-pay | Admitting: Student

## 2022-04-12 NOTE — Telephone Encounter (Signed)
Used Arabic interpreter phone service to call patient reminding him of appointment on 10/27.  Advised to bring all medications to appointment and arrive at 1:15 PM.  Patient was thankful for phone call.

## 2022-04-13 ENCOUNTER — Ambulatory Visit: Payer: Medicaid Other | Admitting: Student

## 2022-04-13 NOTE — Progress Notes (Incomplete)
  SUBJECTIVE:   CHIEF COMPLAINT / HPI:   Type 2 Diabetes: Home medications include: metformin 1000mg  daily. {Blank single:19197::"Does","Does not"} endorse compliance. Home glucose monitoring {home testing:315145}. A1c 6.4 on 03/29/22. Microalbuminuria negative on 09/12/21.  Patient is not up to date on diabetic eye. Patient is not up to date on diabetic foot exam.   Hearing difficulty: Sensorineural hearing loss. Used to wear hearing aids but does not have them anymore. Attempted to be set up with [facility].   Hyperlipidemia: Home medications include Rosuvastatin 20mg  daily. Endorses compliance. Last lipid panel:  Lab Results  Component Value Date   CHOL 137 09/21/2020   HDL 36 (L) 09/21/2020   LDLCALC 74 09/21/2020   TRIG 153 (H) 09/21/2020   CHOLHDL 3.8 09/21/2020   Arabic interpreter used throughout encounter.   PERTINENT  PMH / PSH: T2DM, HLD, tobacco use, MDD, bilateral sensorineural hearing loss  OBJECTIVE:  There were no vitals taken for this visit. Physical Exam   ASSESSMENT/PLAN:  There are no diagnoses linked to this encounter. No follow-ups on file. Wells Guiles, DO 04/13/2022, 12:23 PM PGY-***, [facility] {    This will disappear when note is signed, click to select method of visit    :1}

## 2022-04-29 ENCOUNTER — Other Ambulatory Visit: Payer: Self-pay | Admitting: Student

## 2022-05-14 ENCOUNTER — Other Ambulatory Visit: Payer: Self-pay

## 2022-05-15 MED ORDER — PANTOPRAZOLE SODIUM 40 MG PO TBEC: 40.0000 mg | DELAYED_RELEASE_TABLET | Freq: Two times a day (BID) | ORAL | 0 refills | Status: DC

## 2022-06-14 ENCOUNTER — Telehealth: Payer: Self-pay

## 2022-06-14 ENCOUNTER — Other Ambulatory Visit (HOSPITAL_COMMUNITY): Payer: Self-pay

## 2022-06-14 NOTE — Telephone Encounter (Signed)
A Prior Authorization RENEWAL was initiated & APPROVED for this patients QUETIAPINE 25MG  through CoverMyMeds.   Key:  APPROVED 06/14/22-06/14/23

## 2022-06-18 ENCOUNTER — Emergency Department (HOSPITAL_COMMUNITY)
Admission: EM | Admit: 2022-06-18 | Discharge: 2022-06-18 | Discharge status: Against Medical Advice | Payer: Medicaid Other | Source: Home / Self Care

## 2022-06-18 ENCOUNTER — Observation Stay (HOSPITAL_COMMUNITY): Payer: Medicaid Other

## 2022-06-18 ENCOUNTER — Other Ambulatory Visit: Payer: Self-pay

## 2022-06-18 ENCOUNTER — Observation Stay (HOSPITAL_BASED_OUTPATIENT_CLINIC_OR_DEPARTMENT_OTHER): Payer: Medicaid Other

## 2022-06-18 ENCOUNTER — Emergency Department (HOSPITAL_COMMUNITY): Payer: Medicaid Other

## 2022-06-18 ENCOUNTER — Observation Stay (HOSPITAL_COMMUNITY)
Admission: EM | Admit: 2022-06-18 | Discharge: 2022-06-19 | Discharge status: Against Medical Advice | Payer: Medicaid Other | Attending: Internal Medicine | Admitting: Internal Medicine

## 2022-06-18 ENCOUNTER — Encounter (HOSPITAL_COMMUNITY): Payer: Self-pay

## 2022-06-18 DIAGNOSIS — R0789 Other chest pain: Secondary | ICD-10-CM | POA: Insufficient documentation

## 2022-06-18 DIAGNOSIS — Z79899 Other long term (current) drug therapy: Secondary | ICD-10-CM | POA: Insufficient documentation

## 2022-06-18 DIAGNOSIS — F17210 Nicotine dependence, cigarettes, uncomplicated: Secondary | ICD-10-CM | POA: Diagnosis not present

## 2022-06-18 DIAGNOSIS — Z7982 Long term (current) use of aspirin: Secondary | ICD-10-CM | POA: Diagnosis not present

## 2022-06-18 DIAGNOSIS — R079 Chest pain, unspecified: Secondary | ICD-10-CM | POA: Diagnosis present

## 2022-06-18 DIAGNOSIS — R2990 Unspecified symptoms and signs involving the nervous system: Secondary | ICD-10-CM

## 2022-06-18 DIAGNOSIS — Z5321 Procedure and treatment not carried out due to patient leaving prior to being seen by health care provider: Secondary | ICD-10-CM | POA: Insufficient documentation

## 2022-06-18 DIAGNOSIS — R531 Weakness: Secondary | ICD-10-CM

## 2022-06-18 DIAGNOSIS — E119 Type 2 diabetes mellitus without complications: Secondary | ICD-10-CM | POA: Diagnosis not present

## 2022-06-18 DIAGNOSIS — G459 Transient cerebral ischemic attack, unspecified: Secondary | ICD-10-CM | POA: Diagnosis not present

## 2022-06-18 DIAGNOSIS — M25512 Pain in left shoulder: Secondary | ICD-10-CM | POA: Insufficient documentation

## 2022-06-18 DIAGNOSIS — W1839XA Other fall on same level, initial encounter: Secondary | ICD-10-CM | POA: Insufficient documentation

## 2022-06-18 DIAGNOSIS — M79602 Pain in left arm: Secondary | ICD-10-CM | POA: Insufficient documentation

## 2022-06-18 DIAGNOSIS — R110 Nausea: Secondary | ICD-10-CM | POA: Insufficient documentation

## 2022-06-18 DIAGNOSIS — R29818 Other symptoms and signs involving the nervous system: Secondary | ICD-10-CM | POA: Insufficient documentation

## 2022-06-18 DIAGNOSIS — R200 Anesthesia of skin: Secondary | ICD-10-CM

## 2022-06-18 DIAGNOSIS — R0602 Shortness of breath: Secondary | ICD-10-CM | POA: Insufficient documentation

## 2022-06-18 DIAGNOSIS — Z7984 Long term (current) use of oral hypoglycemic drugs: Secondary | ICD-10-CM | POA: Diagnosis not present

## 2022-06-18 LAB — RAPID URINE DRUG SCREEN, HOSP PERFORMED
Amphetamines: NOT DETECTED
Barbiturates: NOT DETECTED
Benzodiazepines: NOT DETECTED
Cocaine: NOT DETECTED
Opiates: NOT DETECTED
Tetrahydrocannabinol: NOT DETECTED

## 2022-06-18 LAB — URINALYSIS, ROUTINE W REFLEX MICROSCOPIC
Bacteria, UA: NONE SEEN
Bilirubin Urine: NEGATIVE
Glucose, UA: NEGATIVE mg/dL
Ketones, ur: NEGATIVE mg/dL
Leukocytes,Ua: NEGATIVE
Nitrite: NEGATIVE
Protein, ur: NEGATIVE mg/dL
Specific Gravity, Urine: 1.019 (ref 1.005–1.030)
pH: 5 (ref 5.0–8.0)

## 2022-06-18 LAB — CBG MONITORING, ED
Glucose-Capillary: 167 mg/dL — ABNORMAL HIGH (ref 70–99)
Glucose-Capillary: 154 mg/dL — ABNORMAL HIGH (ref 70–99)
Glucose-Capillary: 138 mg/dL — ABNORMAL HIGH (ref 70–99)
Glucose-Capillary: 178 mg/dL — ABNORMAL HIGH (ref 70–99)

## 2022-06-18 LAB — ECHOCARDIOGRAM COMPLETE BUBBLE STUDY
Area-P 1/2: 3.6 cm2
Calc EF: 67.8 %
MV VTI: 3.28 cm2
S' Lateral: 3.1 cm
Single Plane A2C EF: 69.3 %
Single Plane A4C EF: 66 %

## 2022-06-18 LAB — TROPONIN I (HIGH SENSITIVITY)
Troponin I (High Sensitivity): 7 ng/L (ref <18)
Troponin I (High Sensitivity): 8 ng/L (ref <18)
Troponin I (High Sensitivity): 10 ng/L (ref <18)

## 2022-06-18 LAB — COMPREHENSIVE METABOLIC PANEL
ALT: 26 U/L (ref 0–44)
AST: 38 U/L (ref 15–41)
Albumin: 3.7 g/dL (ref 3.5–5.0)
Alkaline Phosphatase: 74 U/L (ref 38–126)
Anion gap: 13 (ref 5–15)
BUN: 13 mg/dL (ref 6–20)
CO2: 19 mmol/L — ABNORMAL LOW (ref 22–32)
Calcium: 8.3 mg/dL — ABNORMAL LOW (ref 8.9–10.3)
Chloride: 104 mmol/L (ref 98–111)
Creatinine, Ser: 0.81 mg/dL (ref 0.61–1.24)
GFR, Estimated: >60 mL/min (ref >60)
Glucose, Bld: 175 mg/dL — ABNORMAL HIGH (ref 70–99)
Potassium: 4.2 mmol/L (ref 3.5–5.1)
Sodium: 136 mmol/L (ref 135–145)
Total Bilirubin: 0.4 mg/dL (ref 0.3–1.2)
Total Protein: 6.5 g/dL (ref 6.5–8.1)

## 2022-06-18 LAB — ETHANOL: Alcohol, Ethyl (B): <10 mg/dL (ref <10)

## 2022-06-18 LAB — CBC
HCT: 44.2 % (ref 39.0–52.0)
Hemoglobin: 15 g/dL (ref 13.0–17.0)
MCH: 28.4 pg (ref 26.0–34.0)
MCHC: 33.9 g/dL (ref 30.0–36.0)
MCV: 83.7 fL (ref 80.0–100.0)
Platelets: 287 10*3/uL (ref 150–400)
RBC: 5.28 MIL/uL (ref 4.22–5.81)
RDW: 13.2 % (ref 11.5–15.5)
WBC: 10.2 10*3/uL (ref 4.0–10.5)
nRBC: 0 % (ref 0.0–0.2)

## 2022-06-18 LAB — LIPASE, BLOOD: Lipase: 35 U/L (ref 11–51)

## 2022-06-18 MED ORDER — INSULIN ASPART 100 UNIT/ML IJ SOLN
0.0000 [IU] | Freq: Three times a day (TID) | INTRAMUSCULAR | Status: DC
  Administered 2022-06-18: 2 [IU] via SUBCUTANEOUS
  Administered 2022-06-18: 1 [IU] via SUBCUTANEOUS
  Filled 2022-06-18: qty 0.09

## 2022-06-18 MED ORDER — LACTATED RINGERS IV BOLUS
1000.0000 mL | Freq: Once | INTRAVENOUS | Status: AC
  Administered 2022-06-18: 1000 mL via INTRAVENOUS

## 2022-06-18 MED ORDER — GADOBUTROL 1 MMOL/ML IV SOLN
6.5000 mL | Freq: Once | INTRAVENOUS | Status: AC | PRN
  Administered 2022-06-18: 6.5 mL via INTRAVENOUS

## 2022-06-18 MED ORDER — NITROGLYCERIN 0.4 MG SL SUBL: 0.4000 mg | SUBLINGUAL_TABLET | SUBLINGUAL | Status: DC | PRN

## 2022-06-18 MED ORDER — ASPIRIN 81 MG PO CHEW
81.0000 mg | CHEWABLE_TABLET | ORAL | Status: AC
  Administered 2022-06-18: 81 mg via ORAL
  Filled 2022-06-18: qty 1

## 2022-06-18 MED ORDER — ACETAMINOPHEN 650 MG RE SUPP: 650.0000 mg | RECTAL | Status: DC | PRN

## 2022-06-18 MED ORDER — ACETAMINOPHEN 160 MG/5ML PO SOLN: 650.0000 mg | ORAL | Status: DC | PRN

## 2022-06-18 MED ORDER — ASPIRIN 81 MG PO CHEW: 81.0000 mg | CHEWABLE_TABLET | Freq: Every day | ORAL | Status: DC

## 2022-06-18 MED ORDER — STROKE: EARLY STAGES OF RECOVERY BOOK
Freq: Once | Status: DC
  Filled 2022-06-18: qty 1

## 2022-06-18 MED ORDER — ATORVASTATIN CALCIUM 40 MG PO TABS
80.0000 mg | ORAL_TABLET | Freq: Every day | ORAL | Status: DC
  Administered 2022-06-18: 80 mg via ORAL
  Filled 2022-06-18: qty 2

## 2022-06-18 MED ORDER — IOHEXOL 350 MG/ML SOLN
100.0000 mL | Freq: Once | INTRAVENOUS | Status: AC | PRN
  Administered 2022-06-18: 100 mL via INTRAVENOUS

## 2022-06-18 MED ORDER — ACETAMINOPHEN 325 MG PO TABS: 650.0000 mg | ORAL_TABLET | ORAL | Status: DC | PRN

## 2022-06-18 MED ORDER — INSULIN ASPART 100 UNIT/ML IJ SOLN
0.0000 [IU] | Freq: Every day | INTRAMUSCULAR | Status: DC
  Filled 2022-06-18: qty 0.05

## 2022-06-18 MED ORDER — ENOXAPARIN SODIUM 40 MG/0.4ML IJ SOSY: 40.0000 mg | PREFILLED_SYRINGE | INTRAMUSCULAR | Status: DC

## 2022-06-18 MED ORDER — SENNOSIDES-DOCUSATE SODIUM 8.6-50 MG PO TABS: 1.0000 | ORAL_TABLET | Freq: Every evening | ORAL | Status: DC | PRN

## 2022-06-18 MED ORDER — CLOPIDOGREL BISULFATE 75 MG PO TABS
75.0000 mg | ORAL_TABLET | Freq: Every day | ORAL | Status: DC
  Filled 2022-06-18: qty 1

## 2022-06-18 MED ORDER — CLOPIDOGREL BISULFATE 75 MG PO TABS
75.0000 mg | ORAL_TABLET | ORAL | Status: AC
  Administered 2022-06-18: 75 mg via ORAL
  Filled 2022-06-18: qty 1

## 2022-06-18 NOTE — ED Notes (Signed)
Pt tx to MRI

## 2022-06-18 NOTE — ED Triage Notes (Addendum)
Patient reports left chest pain with left shoulder and left arm pain , nausea and dizziness this evening . Patient felt dizzy at triage and fell forward , assisted to stretcher.
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
## 2022-06-18 NOTE — Progress Notes (Signed)
  Echocardiogram 2D Echocardiogram has been performed.  Aaron Reyes 06/18/2022, 4:06 PM

## 2022-06-18 NOTE — ED Notes (Signed)
Pt called out and reported that he had not received an update on test results or admission plan since being seen initially. Olena Heckle, NP made aware. Arabic interpreter used.

## 2022-06-18 NOTE — ED Provider Notes (Signed)
[facility] DEPT Provider Note   CSN: [id number] Arrival date & time: 06/18/22  0755     History  Chief Complaint  Patient presents with   Chest Pain   Dizziness    Aaron Reyes is a 47 y.o. male.  47 yo M with hx of DM2, HLD, and self reported MI with cardiac catheterization in 2016 who presents with chest discomfort and dizziness.  History obtained via Arabic interpreter.  CP started at 1230 last night. Felt dizzy and weak. Had chest pain that feels as though someone is sitting on his chest also with L arm heaviness and numbness. Went to [facility] via ambulance but due to long wait times had his son bring him here. Given fluid in ambulance because his blood sugar was high. Catheterization in [place] in 2016 per chart review was normal. Felt nauseous without vomiting last night. Pain is worse with exertion. No diaphoresis. Worse with deep breathing. Does feel SOB. No hx of dvt/pe. No hx of cancer. No hx of hormone use. No recent surgery. 3 heart catheterizations does not believe he had stents placed. Says they were done in [place], [place], and [place]. Doesn't recall where in [place] it was done. Smokes cigarettes. No immediate family members with MI. Also says his L arm is numb and weak and his L leg is also numb that started at 1230 while he was awake. Not on AC.        Home Medications Prior to Admission medications   Medication Sig Start Date End Date Taking? Authorizing Provider  ACCU-CHEK GUIDE test strip as needed. 11/27/21   [provider]  Accu-Chek Softclix Lancets lancets as needed. 11/27/21   [provider]  acetaminophen (TYLENOL) 500 MG tablet Take 1,000 mg by mouth every 6 (six) hours as needed for moderate pain or headache.    [provider]  Amoxicill-Rifabutin-Omeprazole (TALICIA) 440-10.2-72 MG CPDR Take 4 capsules by mouth 3 (three) times daily. Patient not taking: Reported on 12/07/2021 11/14/21   Mansouraty, Telford Nab., MD  aspirin 81 MG chewable tablet Chew 1 tablet (81 mg total) by mouth daily. 03/29/22   Wells Guiles, DO  atomoxetine (STRATTERA) 40 MG capsule Take 1 capsule (40 mg total) by mouth daily. 11/21/21 11/21/22  Nwoko, Terese Door, PA  dicyclomine (BENTYL) 10 MG capsule Take 1 capsule (10 mg total) by mouth 2 (two) times daily as needed for spasms. 01/23/22   Willia Craze, NP  escitalopram (LEXAPRO) 10 MG tablet Take 1 tablet (10 mg total) by mouth daily. 11/21/21 11/21/22  Nwoko, Terese Door, PA  fluticasone (FLONASE) 50 MCG/ACT nasal spray Place 2 sprays into both nostrils daily. 10/24/21   [provider]  gabapentin (NEURONTIN) 300 MG capsule TAKE 1 CAPSULE(300 MG) BY MOUTH AT BEDTIME Patient not taking: Reported on 12/07/2021 09/27/21   Lattie Haw, MD  metFORMIN (GLUCOPHAGE-XR) 500 MG 24 hr tablet TAKE 2 TABLETS(1000 MG) BY MOUTH DAILY WITH BREAKFAST 05/01/22   Wells Guiles, DO  oxyCODONE-acetaminophen (PERCOCET) 10-325 MG tablet Take 1 tablet by mouth every 4 (four) hours as needed. 11/23/21   [provider]  pantoprazole (PROTONIX) 40 MG tablet Take 1 tablet (40 mg total) by mouth 2 (two) times daily. Needs appointment for further refills. 05/15/22 06/14/22  Wells Guiles, DO  QUEtiapine (SEROQUEL) 25 MG tablet TAKE 1 TABLET(25 MG) BY MOUTH AT BEDTIME 02/21/22   Precious Gilding, DO  rosuvastatin (CRESTOR) 20 MG tablet Take 1 tablet (20 mg total)  by mouth daily. 03/29/22 04/28/22  [name], DO  tiZANidine (ZANAFLEX) 4 MG tablet Take 4 mg by mouth 3 (three) times daily. 10/31/21   [provider]  traZODone (DESYREL) 50 MG tablet Take 1 tablet (50 mg total) by mouth at bedtime. 11/21/21   Nwoko, Tommas Olp, PA      Allergies    Pork-derived products    Review of Systems   Review of Systems  Physical Exam Updated Vital Signs BP 117/72 (BP Location: Left Arm)   Pulse 74   Temp (!) 97.4 F (36.3 C) (Oral)   Resp 18   SpO2 97%   Physical Exam Vitals and nursing  note reviewed.  Constitutional:      General: He is not in acute distress.    Appearance: He is well-developed.  HENT:     Head: Normocephalic and atraumatic.     Right Ear: External ear normal.     Left Ear: External ear normal.     Nose: Nose normal.  Eyes:     Extraocular Movements: Extraocular movements intact.     Conjunctiva/sclera: Conjunctivae normal.     Pupils: Pupils are equal, round, and reactive to light.  Cardiovascular:     Rate and Rhythm: Normal rate and regular rhythm.     Heart sounds: Normal heart sounds.  Pulmonary:     Effort: Pulmonary effort is normal. No respiratory distress.     Breath sounds: Normal breath sounds.  Abdominal:     General: There is no distension.     Palpations: Abdomen is soft. There is no mass.     Tenderness: There is no abdominal tenderness. There is no guarding.  Musculoskeletal:     Cervical back: Normal range of motion and neck supple.     Right lower leg: No edema.     Left lower leg: No edema.  Skin:    General: Skin is warm and dry.  Neurological:     Mental Status: He is alert.     Comments: NIHSS Exam  Level of Consciousness: Alert  LOC Questions: Answers Month Correctly.  Does not know his age which his son says this is baseline. LOC Commands: Opens and Closes Eyes and Hands on command  Best Gaze: Horizontal ocular movements intact  Visual Fields: No visual field loss  Facial Palsy: None  L Upper Extremity Motor: Drift R Upper Extremity Motor: No drift after 10 seconds  L Lower extremity Motor: Drift R Lower extremity Motor: Drift Ataxia: Absent  Sensory: Diminished sensation to light touch on left upper extremity and left lower extremity Best Language: No aphasia  Dysarthria: Per son speech is slurred Neglect: No visual or sensory neglect    Psychiatric:        Mood and Affect: Mood normal.        Behavior: Behavior normal.     ED Results / Procedures / Treatments   Labs (all labs ordered are listed, but  only abnormal results are displayed) Labs Reviewed  URINALYSIS, ROUTINE W REFLEX MICROSCOPIC - Abnormal; Notable for the following components:      Result Value   Hgb urine dipstick SMALL (*)    All other components within normal limits  CBG MONITORING, ED - Abnormal; Notable for the following components:   Glucose-Capillary 167 (*)    All other components within normal limits  LIPASE, BLOOD  ETHANOL  RAPID URINE DRUG SCREEN, HOSP PERFORMED  HEMOGLOBIN A1C  CBC  CREATININE, SERUM  TROPONIN I (HIGH  SENSITIVITY)  TROPONIN I (HIGH SENSITIVITY)    EKG EKG Interpretation  Date/Time:  Monday June 18 2022 08:27:49 EST Ventricular Rate:  78 PR Interval:  125 QRS Duration: 79 QT Interval:  361 QTC Calculation: 412 R Axis:   50 Text Interpretation: Sinus rhythm Consider left ventricular hypertrophy Confirmed by Vonita MossPaterson, Cameryn Chrisley [phone]([id number]) on 06/18/2022 8:34:59 AM  Radiology CT Angio Chest/Abd/Pel for Dissection W and/or Wo Contrast  Result Date: 06/18/2022 CLINICAL DATA:  Chest pain and left-sided weakness. Rule out acute aortic syndrome. EXAM: CT ANGIOGRAPHY CHEST, ABDOMEN AND PELVIS TECHNIQUE: Non-contrast CT of the chest was initially obtained. Multidetector CT imaging through the chest, abdomen and pelvis was performed using the standard protocol during bolus administration of intravenous contrast. Multiplanar reconstructed images and MIPs were obtained and reviewed to evaluate the vascular anatomy. RADIATION DOSE REDUCTION: This exam was performed according to the departmental dose-optimization program which includes automated exposure control, adjustment of the mA and/or kV according to patient size and/or use of iterative reconstruction technique. CONTRAST:  100mL OMNIPAQUE IOHEXOL 350 MG/ML SOLN COMPARISON:  CT AP 09/14/2021 FINDINGS: CTA CHEST FINDINGS Cardiovascular: Preferential opacification of the thoracic aorta. No evidence of thoracic aortic aneurysm or dissection. Normal heart  size. No pericardial effusion. Mediastinum/Nodes: No enlarged mediastinal, hilar, or axillary lymph nodes. Thyroid gland, trachea, and esophagus demonstrate no significant findings. Lungs/Pleura: No pleural fluid. No airspace disease. No signs of pneumothorax. Biapical pleuroparenchymal scarring noted. Mild dependent changes within the posterior aspect of the lungs. Musculoskeletal: No chest wall abnormality. No acute or significant osseous findings. Remote healed proximal body of sternum fracture. Review of the MIP images confirms the above findings. CTA ABDOMEN AND PELVIS FINDINGS VASCULAR Aorta: Normal caliber aorta without aneurysm, dissection, vasculitis or significant stenosis. Celiac: Patent without evidence of aneurysm, dissection, vasculitis or significant stenosis. SMA: Patent without evidence of aneurysm, dissection, vasculitis or significant stenosis. Renals: Both renal arteries are patent without evidence of aneurysm, dissection, vasculitis, fibromuscular dysplasia or significant stenosis. IMA: Patent Inflow: Patent without evidence of aneurysm, dissection, vasculitis or significant stenosis. Veins: No obvious venous abnormality within the limitations of this arterial phase study. Review of the MIP images confirms the above findings. NON-VASCULAR Hepatobiliary: There is diffuse hepatic steatosis. No focal liver abnormality is seen. No gallstones, gallbladder wall thickening, or biliary dilatation. Pancreas: Unremarkable. No pancreatic ductal dilatation or surrounding inflammatory changes. Spleen: Normal in size without focal abnormality. Adrenals/Urinary Tract: Normal adrenal glands. Unchanged appearance of left kidney cysts. No follow-up imaging recommended. No signs of hydronephrosis. Urinary bladder is unremarkable. Stomach/Bowel: Stomach is unremarkable. The appendix is visualized and appears normal. No pathologic dilatation of the large or small bowel loops. No bowel wall thickening or  inflammation. Lymphatic: No signs of abdominal or pelvic adenopathy. Reproductive: Prostate is unremarkable. Other: No free fluid or fluid collections. Musculoskeletal: No acute or significant osseous findings. Review of the MIP images confirms the above findings. IMPRESSION: 1. No acute findings within the chest, abdomen or pelvis. No evidence for aortic dissection or aneurysm. 2. Hepatic steatosis. Electronically Signed   By: Signa Kellaylor  Stroud M.D.   On: 06/18/2022 10:37   CT ANGIO HEAD NECK W WO CM W PERF (CODE STROKE)  Result Date: 06/18/2022 CLINICAL DATA:  47 year old male with left side weakness and numbness. Chest pain, dizziness, headache. EXAM: CT ANGIOGRAPHY HEAD AND NECK CT PERFUSION BRAIN TECHNIQUE: Multidetector CT imaging of the head and neck was performed using the standard protocol during bolus administration of intravenous contrast. Multiplanar CT image reconstructions and  MIPs were obtained to evaluate the vascular anatomy. Carotid stenosis measurements (when applicable) are obtained utilizing NASCET criteria, using the distal internal carotid diameter as the denominator. Multiphase CT imaging of the brain was performed following IV bolus contrast injection. Subsequent parametric perfusion maps were calculated using RAPID software. RADIATION DOSE REDUCTION: This exam was performed according to the departmental dose-optimization program which includes automated exposure control, adjustment of the mA and/or kV according to patient size and/or use of iterative reconstruction technique. CONTRAST:  145mL OMNIPAQUE IOHEXOL 350 MG/ML SOLN COMPARISON:  Plain head CT 0946 hours reported separately. FINDINGS: CT Brain Perfusion Findings: ASPECTS: See comparison. CBF (<30%) Volume: None Perfusion (Tmax>6.0s) volume: None Mismatch Volume: None.  No abnormal CTP parameters. Infarction Location:Not applicable CTA NECK Skeleton: No acute osseous abnormality identified. Upper chest: Visible central pulmonary  arteries are enhancing and appear to be patent. Mild apical lung scarring. No consolidation or pleural effusion in the visible lungs. Negative visible superior mediastinum. Other neck: No acute soft tissue finding identified. Aortic arch: Normal, 3 vessel arch configuration. Right carotid system: Negative. Left carotid system: Negative. Vertebral arteries: Negative.  Codominant cervical vertebral arteries. CTA HEAD Posterior circulation: Normal distal vertebral arteries and vertebrobasilar junction. Normal PICA origins. Patent basilar artery, AICA, SCA and PCA origins. Posterior communicating arteries are diminutive or absent. Bilateral PCA branches are within normal limits. Anterior circulation: Both ICA siphons are patent without plaque or stenosis. Normal ophthalmic artery origins. Patent carotid termini, MCA and ACA origins. Normal anterior communicating artery, bilateral ECA branches. Left MCA M1 segment and bifurcation are patent without stenosis. Right MCA M1 segment and trifurcation are patent without stenosis. Bilateral MCA branches are within normal limits. Venous sinuses: Patent. Anatomic variants: None significant. Review of the MIP images confirms the above findings IMPRESSION: 1. Normal CTA Head and Neck.  Negative CT Perfusion. 2. CTA chest abdomen and pelvis reported separately. Preliminary report of the above was communicated to Dr. Quinn Axe by Dr. Lawrence Santiago using the [facility] messaging system. Electronically Signed   By: Genevie Ann M.D.   On: 06/18/2022 10:17   CT HEAD CODE STROKE WO CONTRAST  Result Date: 06/18/2022 CLINICAL DATA:  Code stroke. Neuro deficit, acute, stroke suspected. Left-sided weakness and numbness. Chest pain. Dizziness. EXAM: CT HEAD WITHOUT CONTRAST TECHNIQUE: Contiguous axial images were obtained from the base of the skull through the vertex without intravenous contrast. RADIATION DOSE REDUCTION: This exam was performed according to the departmental dose-optimization program  which includes automated exposure control, adjustment of the mA and/or kV according to patient size and/or use of iterative reconstruction technique. COMPARISON:  CT head without contrast and CTA head and neck 01/31/2021. MR head and IAC 02/02/2021 FINDINGS: Brain: No acute infarct, hemorrhage, or mass lesion is present. No significant white matter lesions are present. Deep brain nuclei are within normal limits. The ventricles are of normal size. No significant extraaxial fluid collection is present. The brainstem and cerebellum are within normal limits. Vascular: No hyperdense vessel or unexpected calcification. Skull: Calvarium is intact. No focal lytic or blastic lesions are present. No significant extracranial soft tissue lesion is present. Sinuses/Orbits: The paranasal sinuses and mastoid air cells are clear. The globes and orbits are within normal limits. ASPECTS [facility] Stroke Program Early CT Score) - Ganglionic level infarction (caudate, lentiform nuclei, internal capsule, insula, M1-M3 cortex): 7/7 - Supraganglionic infarction (M4-M6 cortex): 3/3 Total score (0-10 with 10 being normal): 10/10 IMPRESSION: 1. Negative CT of the head. 2. Aspects is 10/10. Electronically Signed  By: Marin Roberts M.D.   On: 06/18/2022 10:12   DG Chest 2 View  Result Date: 06/18/2022 CLINICAL DATA:  Chest pain and shortness of breath. EXAM: CHEST - 2 VIEW COMPARISON:  September 14, 2021 FINDINGS: The heart size and mediastinal contours are within normal limits. Both lungs are clear. The visualized skeletal structures are unremarkable. IMPRESSION: No active cardiopulmonary disease. Electronically Signed   By: Aram Candela M.D.   On: 06/18/2022 03:37    Procedures Procedures   Medications Ordered in ED Medications  clopidogrel (PLAVIX) tablet 75 mg (has no administration in time range)   stroke: early stages of recovery book (has no administration in time range)  acetaminophen (TYLENOL) tablet 650 mg (has no  administration in time range)    Or  acetaminophen (TYLENOL) 160 MG/5ML solution 650 mg (has no administration in time range)    Or  acetaminophen (TYLENOL) suppository 650 mg (has no administration in time range)  senna-docusate (Senokot-S) tablet 1 tablet (has no administration in time range)  enoxaparin (LOVENOX) injection 40 mg (has no administration in time range)  aspirin chewable tablet 81 mg (has no administration in time range)  clopidogrel (PLAVIX) tablet 75 mg (has no administration in time range)  iohexol (OMNIPAQUE) 350 MG/ML injection 100 mL (100 mLs Intravenous Contrast Given 06/18/22 0953)  iohexol (OMNIPAQUE) 350 MG/ML injection 100 mL (100 mLs Intravenous Contrast Given 06/18/22 1007)  aspirin chewable tablet 81 mg (81 mg Oral Given 06/18/22 1132)  lactated ringers bolus 1,000 mL (1,000 mLs Intravenous New Bag/Given 06/18/22 1132)    ED Course/ Medical Decision Making/ A&P Clinical Course as of 06/18/22 1149  Mon Jun 18, 2022  0930 Dr Selina Cooley evaluated the patient per teleneurology. [RP]  1015 Per Dr. Selina Cooley CTA of the head and neck and CT perfusion were negative.  No indication for thrombectomy.  Feels the patient should be admitted to [facility] for stroke workup with dissection workup is negative. [RP]  1131 Dr Hanley Ben [RP]    Clinical Course User Index [RP] Rondel Baton, MD                           Medical Decision Making Amount and/or Complexity of Data Reviewed Labs: ordered. Radiology: ordered.  Risk OTC drugs. Prescription drug management. Decision regarding hospitalization.   Jovanne Riggenbach Kerins is a 47 y.o. male with comorbidities that complicate the patient evaluation including self-reported MI, diabetes, hyperlipidemia who presents with left upper extremity weakness and numbness that started at 1230 last night along with dizziness and chest pain  Initial Ddx:  Stroke, MI, dissection, PE  MDM:  Patient is technically Zenaida Niece positive with his sons reported  slurred speech and left upper extremity weakness so will activate code stroke.  Not within the window for tPA but may be amenable to LVO if it is found on his head imaging.  Concern the patient may be having a dissection or MI given his symptoms.  Will obtain EKG again here.  Initial troponin and EKG at [facility] was reassuring.  Chest x-ray done at [facility] today without acute findings.  With his neurosymptoms and chest pain we will scan him for dissection.  Considered PE but patient is PE RC negative.  Plan:  Labs Code stroke activation CT head, CTA head and neck CT perfusion CTA dissection protocol  ED Summary/Re-evaluation:  Patient reevaluated and stable in the emergency department CT scan does not show evidence of stroke  or dissection.  Neurology felt that with his deficits she should be admitted for MRI and stroke evaluation.  Patient started on Plavix and aspirin.  Repeat troponin WNL for the patient so feel that MI is highly unlikely.  May require stress test this admission though.  Neurology requesting that he be transferred to [facility] for further evaluation.  This patient presents to the ED for concern of complaints listed in HPI, this involves an extensive number of treatment options, and is a complaint that carries with it a high risk of complications and morbidity. Disposition including potential need for admission considered.   Dispo: Admit to Floor  Additional history obtained from son Records reviewed ED Visit Notes The following labs were independently interpreted: Serial Troponins and show no acute abnormality I independently reviewed the following imaging with scope of interpretation limited to determining acute life threatening conditions related to emergency care: CT Head and agree with the radiologist interpretation with the following exceptions: none I personally reviewed and interpreted cardiac monitoring: normal sinus rhythm  I personally reviewed and interpreted  the pt's EKG: see above for interpretation  I have reviewed the patients home medications and made adjustments as needed Consults: Neurology Social Determinants of health:  Arabic speaking only  Final Clinical Impression(s) / ED Diagnoses Final diagnoses:  Stroke-like symptoms  Chest pain, unspecified type    Rx / DC Orders ED Discharge Orders     None      CRITICAL CARE Performed by: Rondel Baton   Total critical care time: 60 minutes  Critical care time was exclusive of separately billable procedures and treating other patients.  Critical care was necessary to treat or prevent imminent or life-threatening deterioration.  Critical care was time spent personally by me on the following activities: development of treatment plan with patient and/or surrogate as well as nursing, discussions with consultants, evaluation of patient's response to treatment, examination of patient, obtaining history from patient or surrogate, ordering and performing treatments and interventions, ordering and review of laboratory studies, ordering and review of radiographic studies, pulse oximetry and re-evaluation of patient's condition.    Rondel Baton, MD 06/18/22 1149

## 2022-06-18 NOTE — H&P (Signed)
History and Physical    Daman Steffenhagen [facility] XHB:[id number] DOB: Mar 26, 1976 DOA: 06/18/2022  PCP: [name], DO   Patient coming from: Home  I have personally briefly reviewed patient's old medical records in [facility]  Chief Complaint: Chest pain and dizziness  HPI: Aaron Reyes is a 47 y.o. male with medical history significant of diabetes mellitus type 2, hyperlipidemia, PTSD, hyperlipidemia presented with chest discomfort and dizziness.  Chest pain started around 1230 last night and he felt dizzy and weak.  He complained of chest pain felt as if someone was sitting on his chest along with left arm heaviness and numbness he also complained of nausea without vomiting.  Chest pain worse with exertion but no diaphoresis.  Worsened with deep breathing.  No history of recent travel or history of DVT/PE.  No history of recent surgery.  Also complained of left arm numbness and weakness and left leg numbness that started around 12:30 AM last night.  Patient initially presented to [facility], ED but left because of long wait time and came to [facility] long ED.  ED Course: Code stroke was called.  Patient was evaluated by teleneurology.  CT head was negative for acute infarct.  Chest x-ray negative for acute infiltrate.  CT angio head, neck was normal; CTA chest, abdomen and pelvis was negative for dissection or aneurysm.  Teleneurology recommended admission to [facility] for stroke workup.  High-sensitivity troponin was negative.  EKG negative for ST elevations or depressions. Hospitalist service was called to evaluate the patient.  Review of Systems: As per HPI otherwise all other systems were reviewed and are negative.   Past Medical History:  Diagnosis Date   Bilateral sensorineural hearing loss 02/07/2017   Cervicalgia 10/19/2016   Closed fracture of body of sternum 06/09/2018   Diabetes mellitus without complication (HCC)    per pt report   Food insecurity 01/02/2019    GERD (gastroesophageal reflux disease)    H. 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   Testicular mass 03/18/2015    Past Surgical History:  Procedure Laterality Date   CARDIAC CATHETERIZATION     CARDIAC CATHETERIZATION     x 2   CARDIAC CATHETERIZATION N/A 04/11/2015   Procedure: Left Heart Cath and Coronary Angiography;  Surgeon: Lyn Records, MD;  Location: [facility] INVASIVE CV LAB;  Service: Cardiovascular;  Laterality: N/A;   HERNIA REPAIR     UPPER GASTROINTESTINAL ENDOSCOPY  11/07/2021     reports that he has been smoking cigarettes. He has been smoking an average of .5 packs per day. He has never used smokeless tobacco. He reports that he does not drink alcohol and does not use drugs.  Allergies  Allergen Reactions   Pork-Derived Products Other (See Comments)    per religious preference    Family History  Problem Relation Age of Onset   Diabetes Mother    Hypertension Mother    Cancer Father    CAD Father    Heart attack Father  Hypertension Father    Stroke Father    Colon cancer Neg Hx    Esophageal cancer Neg Hx    Rectal cancer Neg Hx    Stomach cancer Neg Hx     Prior to Admission medications   Medication Sig Start Date End Date Taking? Authorizing Provider  ACCU-CHEK GUIDE test strip as needed. 11/27/21   [provider]  Accu-Chek Softclix Lancets lancets as needed. 11/27/21   [provider]  acetaminophen (TYLENOL) 500 MG tablet Take 1,000 mg by mouth every 6 (six) hours as needed for moderate pain or headache.     [provider]  Amoxicill-Rifabutin-Omeprazole (TALICIA) 250-12.5-10 MG CPDR Take 4 capsules by mouth 3 (three) times daily. Patient not taking: Reported on 12/07/2021 11/14/21   Mansouraty, Netty StarringGabriel Jr., MD  aspirin 81 MG chewable tablet Chew 1 tablet (81 mg total) by mouth daily. 03/29/22   Shelby Mattocksahbura, Anton, DO  atomoxetine (STRATTERA) 40 MG capsule Take 1 capsule (40 mg total) by mouth daily. 11/21/21 11/21/22  Nwoko, Tommas OlpUchenna E, PA  dicyclomine (BENTYL) 10 MG capsule Take 1 capsule (10 mg total) by mouth 2 (two) times daily as needed for spasms. 01/23/22   Meredith PelGuenther, Paula M, NP  escitalopram (LEXAPRO) 10 MG tablet Take 1 tablet (10 mg total) by mouth daily. 11/21/21 11/21/22  Nwoko, Tommas OlpUchenna E, PA  fluticasone (FLONASE) 50 MCG/ACT nasal spray Place 2 sprays into both nostrils daily. 10/24/21   [provider]  gabapentin (NEURONTIN) 300 MG capsule TAKE 1 CAPSULE(300 MG) BY MOUTH AT BEDTIME Patient not taking: Reported on 12/07/2021 09/27/21   Towanda OctavePatel, Poonam, MD  metFORMIN (GLUCOPHAGE-XR) 500 MG 24 hr tablet TAKE 2 TABLETS(1000 MG) BY MOUTH DAILY WITH BREAKFAST 05/01/22   Shelby Mattocksahbura, Anton, DO  oxyCODONE-acetaminophen (PERCOCET) 10-325 MG tablet Take 1 tablet by mouth every 4 (four) hours as needed. 11/23/21   [provider]  pantoprazole (PROTONIX) 40 MG tablet Take 1 tablet (40 mg total) by mouth 2 (two) times daily. Needs appointment for further refills. 05/15/22 06/14/22  Shelby Mattocksahbura, Anton, DO  QUEtiapine (SEROQUEL) 25 MG tablet TAKE 1 TABLET(25 MG) BY MOUTH AT BEDTIME 02/21/22   Erick AlleyJones, Sarah, DO  rosuvastatin (CRESTOR) 20 MG tablet Take 1 tablet (20 mg total) by mouth daily. 03/29/22 04/28/22  Shelby Mattocksahbura, Anton, DO  tiZANidine (ZANAFLEX) 4 MG tablet Take 4 mg by mouth 3 (three) times daily. 10/31/21   [provider]  traZODone (DESYREL) 50 MG tablet Take 1 tablet (50 mg total) by mouth at bedtime. 11/21/21   Meta HatchetNwoko, Uchenna E, PA    Physical Exam: Vitals:   06/18/22 0844 06/18/22 0912  06/18/22 1044 06/18/22 1114  BP: 112/87  118/67 117/72  Pulse: 73  79 74  Resp: 18  19 18   Temp:  (!) 97.4 F (36.3 C)    TempSrc:  Oral    SpO2: 98%  98% 97%    Constitutional: NAD, calm, comfortable Vitals:   06/18/22 0844 06/18/22 0912 06/18/22 1044 06/18/22 1114  BP: 112/87  118/67 117/72  Pulse: 73  79 74  Resp: 18  19 18   Temp:  (!) 97.4 F (36.3 C)    TempSrc:  Oral    SpO2: 98%  98% 97%   Eyes: PERRL, lids and conjunctivae normal ENMT: Mucous membranes are moist. Posterior pharynx clear of any exudate or lesions. Neck: normal, supple, no masses, no thyromegaly Respiratory: bilateral decreased breath sounds at bases, no wheezing, no crackles. Normal respiratory effort. No accessory muscle use.  Cardiovascular:  S1 S2 positive, rate controlled. No extremity edema. 2+ pedal pulses.  Abdomen: no tenderness, no masses palpated. No hepatosplenomegaly. Bowel sounds positive.  Musculoskeletal: no clubbing / cyanosis. No joint deformity upper and lower extremities.  Skin: no rashes, lesions, ulcers. No induration Neurologic: CN 2-12 grossly intact. Moving extremities. No obvious focal neurologic deficits.  Sleepy, wakes up slightly, does not participate in conversation much. Psychiatric: Flat affect.  No signs of agitation.   Labs on Admission: I have personally reviewed following labs and imaging studies  CBC: Recent Labs  Lab 06/18/22 0330  WBC 10.2  HGB 15.0  HCT 44.2  MCV 83.7  PLT 287   Basic Metabolic Panel: Recent Labs  Lab 06/18/22 0330  NA 136  K 4.2  CL 104  CO2 19*  GLUCOSE 175*  BUN 13  CREATININE 0.81  CALCIUM 8.3*   GFR: CrCl cannot be calculated (Unknown ideal weight.). Liver Function Tests: Recent Labs  Lab 06/18/22 0330  AST 38  ALT 26  ALKPHOS 74  BILITOT 0.4  PROT 6.5  ALBUMIN 3.7   Recent Labs  Lab 06/18/22 0834  LIPASE 35   No results for input(s): "AMMONIA" in the last 168 hours. Coagulation Profile: No results for  input(s): "INR", "PROTIME" in the last 168 hours. Cardiac Enzymes: No results for input(s): "CKTOTAL", "CKMB", "CKMBINDEX", "TROPONINI" in the last 168 hours. BNP (last 3 results) No results for input(s): "PROBNP" in the last 8760 hours. HbA1C: No results for input(s): "HGBA1C" in the last 72 hours. CBG: Recent Labs  Lab 06/18/22 0833  GLUCAP 167*   Lipid Profile: No results for input(s): "CHOL", "HDL", "LDLCALC", "TRIG", "CHOLHDL", "LDLDIRECT" in the last 72 hours. Thyroid Function Tests: No results for input(s): "TSH", "T4TOTAL", "FREET4", "T3FREE", "THYROIDAB" in the last 72 hours. Anemia Panel: No results for input(s): "VITAMINB12", "FOLATE", "FERRITIN", "TIBC", "IRON", "RETICCTPCT" in the last 72 hours. Urine analysis:    Component Value Date/Time   COLORURINE YELLOW 06/18/2022 0918   APPEARANCEUR CLEAR 06/18/2022 0918   LABSPEC 1.019 06/18/2022 0918   PHURINE 5.0 06/18/2022 0918   GLUCOSEU NEGATIVE 06/18/2022 0918   HGBUR SMALL (A) 06/18/2022 0918   BILIRUBINUR NEGATIVE 06/18/2022 0918   BILIRUBINUR small 01/26/2016 1727   KETONESUR NEGATIVE 06/18/2022 0918   PROTEINUR NEGATIVE 06/18/2022 0918   UROBILINOGEN 0.2 02/11/2020 1509   NITRITE NEGATIVE 06/18/2022 0918   LEUKOCYTESUR NEGATIVE 06/18/2022 0918    Radiological Exams on Admission: CT Angio Chest/Abd/Pel for Dissection W and/or Wo Contrast  Result Date: 06/18/2022 CLINICAL DATA:  Chest pain and left-sided weakness. Rule out acute aortic syndrome. EXAM: CT ANGIOGRAPHY CHEST, ABDOMEN AND PELVIS TECHNIQUE: Non-contrast CT of the chest was initially obtained. Multidetector CT imaging through the chest, abdomen and pelvis was performed using the standard protocol during bolus administration of intravenous contrast. Multiplanar reconstructed images and MIPs were obtained and reviewed to evaluate the vascular anatomy. RADIATION DOSE REDUCTION: This exam was performed according to the departmental dose-optimization program  which includes automated exposure control, adjustment of the mA and/or kV according to patient size and/or use of iterative reconstruction technique. CONTRAST:  OMNIPAQUE IOHEXOL 350 MG/ML SOLN COMPARISON:  CT AP 09/14/2021 FINDINGS: CTA CHEST FINDINGS Cardiovascular: Preferential opacification of the thoracic aorta. No evidence of thoracic aortic aneurysm or dissection. Normal heart size. No pericardial effusion. Mediastinum/Nodes: No enlarged mediastinal, hilar, or axillary lymph nodes. Thyroid gland, trachea, and esophagus demonstrate no significant findings. Lungs/Pleura: No pleural fluid. No airspace disease. No signs of pneumothorax. Biapical pleuroparenchymal scarring  noted. Mild dependent changes within the posterior aspect of the lungs. Musculoskeletal: No chest wall abnormality. No acute or significant osseous findings. Remote healed proximal body of sternum fracture. Review of the MIP images confirms the above findings. CTA ABDOMEN AND PELVIS FINDINGS VASCULAR Aorta: Normal caliber aorta without aneurysm, dissection, vasculitis or significant stenosis. Celiac: Patent without evidence of aneurysm, dissection, vasculitis or significant stenosis. SMA: Patent without evidence of aneurysm, dissection, vasculitis or significant stenosis. Renals: Both renal arteries are patent without evidence of aneurysm, dissection, vasculitis, fibromuscular dysplasia or significant stenosis. IMA: Patent Inflow: Patent without evidence of aneurysm, dissection, vasculitis or significant stenosis. Veins: No obvious venous abnormality within the limitations of this arterial phase study. Review of the MIP images confirms the above findings. NON-VASCULAR Hepatobiliary: There is diffuse hepatic steatosis. No focal liver abnormality is seen. No gallstones, gallbladder wall thickening, or biliary dilatation. Pancreas: Unremarkable. No pancreatic ductal dilatation or surrounding inflammatory changes. Spleen: Normal in size  without focal abnormality. Adrenals/Urinary Tract: Normal adrenal glands. Unchanged appearance of left kidney cysts. No follow-up imaging recommended. No signs of hydronephrosis. Urinary bladder is unremarkable. Stomach/Bowel: Stomach is unremarkable. The appendix is visualized and appears normal. No pathologic dilatation of the large or small bowel loops. No bowel wall thickening or inflammation. Lymphatic: No signs of abdominal or pelvic adenopathy. Reproductive: Prostate is unremarkable. Other: No free fluid or fluid collections. Musculoskeletal: No acute or significant osseous findings. Review of the MIP images confirms the above findings. IMPRESSION: 1. No acute findings within the chest, abdomen or pelvis. No evidence for aortic dissection or aneurysm. 2. Hepatic steatosis. Electronically Signed   By: Kerby Moors M.D.   On: 06/18/2022 10:37   CT ANGIO HEAD NECK W WO CM W PERF (CODE STROKE)  Result Date: 06/18/2022 CLINICAL DATA:  47 year old male with left side weakness and numbness. Chest pain, dizziness, headache. EXAM: CT ANGIOGRAPHY HEAD AND NECK CT PERFUSION BRAIN TECHNIQUE: Multidetector CT imaging of the head and neck was performed using the standard protocol during bolus administration of intravenous contrast. Multiplanar CT image reconstructions and MIPs were obtained to evaluate the vascular anatomy. Carotid stenosis measurements (when applicable) are obtained utilizing NASCET criteria, using the distal internal carotid diameter as the denominator. Multiphase CT imaging of the brain was performed following IV bolus contrast injection. Subsequent parametric perfusion maps were calculated using RAPID software. RADIATION DOSE REDUCTION: This exam was performed according to the departmental dose-optimization program which includes automated exposure control, adjustment of the mA and/or kV according to patient size and/or use of iterative reconstruction technique. CONTRAST:  116mL OMNIPAQUE IOHEXOL  350 MG/ML SOLN COMPARISON:  Plain head CT 0946 hours reported separately. FINDINGS: CT Brain Perfusion Findings: ASPECTS: See comparison. CBF (<30%) Volume: None Perfusion (Tmax>6.0s) volume: None Mismatch Volume: None.  No abnormal CTP parameters. Infarction Location:Not applicable CTA NECK Skeleton: No acute osseous abnormality identified. Upper chest: Visible central pulmonary arteries are enhancing and appear to be patent. Mild apical lung scarring. No consolidation or pleural effusion in the visible lungs. Negative visible superior mediastinum. Other neck: No acute soft tissue finding identified. Aortic arch: Normal, 3 vessel arch configuration. Right carotid system: Negative. Left carotid system: Negative. Vertebral arteries: Negative.  Codominant cervical vertebral arteries. CTA HEAD Posterior circulation: Normal distal vertebral arteries and vertebrobasilar junction. Normal PICA origins. Patent basilar artery, AICA, SCA and PCA origins. Posterior communicating arteries are diminutive or absent. Bilateral PCA branches are within normal limits. Anterior circulation: Both ICA siphons are patent without plaque or stenosis. Normal  ophthalmic artery origins. Patent carotid termini, MCA and ACA origins. Normal anterior communicating artery, bilateral ECA branches. Left MCA M1 segment and bifurcation are patent without stenosis. Right MCA M1 segment and trifurcation are patent without stenosis. Bilateral MCA branches are within normal limits. Venous sinuses: Patent. Anatomic variants: None significant. Review of the MIP images confirms the above findings IMPRESSION: 1. Normal CTA Head and Neck.  Negative CT Perfusion. 2. CTA chest abdomen and pelvis reported separately. Preliminary report of the above was communicated to Dr. Selina Cooley by Dr. Gennette Pac using the [facility] messaging system. Electronically Signed   By: Odessa Fleming M.D.   On: 06/18/2022 10:17   CT HEAD CODE STROKE WO CONTRAST  Result Date:  06/18/2022 CLINICAL DATA:  Code stroke. Neuro deficit, acute, stroke suspected. Left-sided weakness and numbness. Chest pain. Dizziness. EXAM: CT HEAD WITHOUT CONTRAST TECHNIQUE: Contiguous axial images were obtained from the base of the skull through the vertex without intravenous contrast. RADIATION DOSE REDUCTION: This exam was performed according to the departmental dose-optimization program which includes automated exposure control, adjustment of the mA and/or kV according to patient size and/or use of iterative reconstruction technique. COMPARISON:  CT head without contrast and CTA head and neck 01/31/2021. MR head and IAC 02/02/2021 FINDINGS: Brain: No acute infarct, hemorrhage, or mass lesion is present. No significant white matter lesions are present. Deep brain nuclei are within normal limits. The ventricles are of normal size. No significant extraaxial fluid collection is present. The brainstem and cerebellum are within normal limits. Vascular: No hyperdense vessel or unexpected calcification. Skull: Calvarium is intact. No focal lytic or blastic lesions are present. No significant extracranial soft tissue lesion is present. Sinuses/Orbits: The paranasal sinuses and mastoid air cells are clear. The globes and orbits are within normal limits. ASPECTS [facility] Stroke Program Early CT Score) - Ganglionic level infarction (caudate, lentiform nuclei, internal capsule, insula, M1-M3 cortex): 7/7 - Supraganglionic infarction (M4-M6 cortex): 3/3 Total score (0-10 with 10 being normal): 10/10 IMPRESSION: 1. Negative CT of the head. 2. Aspects is 10/10. Electronically Signed   By: Marin Roberts M.D.   On: 06/18/2022 10:12   DG Chest 2 View  Result Date: 06/18/2022 CLINICAL DATA:  Chest pain and shortness of breath. EXAM: CHEST - 2 VIEW COMPARISON:  September 14, 2021 FINDINGS: The heart size and mediastinal contours are within normal limits. Both lungs are clear. The visualized skeletal structures are  unremarkable. IMPRESSION: No active cardiopulmonary disease. Electronically Signed   By: Aram Candela M.D.   On: 06/18/2022 03:37    EKG: Independently reviewed.  No ST elevations or depressions  Assessment/Plan  Left-sided weakness and numbness: Rule out TIA versus CVA -Out of window for tPA.  Symptoms started around 12:30 AM last night. -CT head and CTA of head and neck negative for any acute abnormality.  Teleneurology recommending admission to [facility] for further stroke workup with MRI of brain along with MRI of C-spine along with TTE with bubble study. -Telemonitoring.  Check A1c and LDL -Start aspirin and Plavix as per teleneurology recommendations.  Notify neurology team once patient reaches [facility]. -Monitor mental status. -PT/OT/SLP evaluation -Allow for permissive hypertension  Chest pain -Questionable cause.  High sensitive troponin negative.  EKG showing no ischemic changes.  Cardiac cath in 2016 apparently was normal.  Follow 2D echo.  Diabetes mellitus type 2 -CBGs with SSI.  Check A1c.  Hold metformin  Hyperlipidemia -Follow lipid profile.  Start Lipitor 80 mg daily for  now.  PTSD -Resume home regimen once verified  DVT prophylaxis: Lovenox Code Status: Full Family Communication: None at bedside Disposition Plan: Home in 1 to 2 days once clinically improved Consults called: Teleneurology Admission status: Observation/telemetry   Severity of Illness: The appropriate patient status for this patient is OBSERVATION. Observation status is judged to be reasonable and necessary in order to provide the required intensity of service to ensure the patient's safety. The patient's presenting symptoms, physical exam findings, and initial radiographic and laboratory data in the context of their medical condition is felt to place them at decreased risk for further clinical deterioration. Furthermore, it is anticipated that the patient will be medically  stable for discharge from the hospital within 2 midnights of admission.     Aline August MD [facility]  06/18/2022, 11:41 AM

## 2022-06-18 NOTE — ED Provider Triage Note (Signed)
Emergency Medicine Provider Triage Evaluation Note  Aaron Reyes [facility] , a 47 y.o. male  was evaluated in triage.  Pt complains of chest pain with radiation to left shoulder and left chest, starting around 12:30 AM.  He has had associated nausea without vomiting.  Patient has a history of chest pain and was hospitalized in 2022.  He also has history of heart catheterization in 2016 which was normal.  Denies fever or cough.  Interpreter needed and used for initial history.  Review of Systems  Positive: Chest pain, nausea, shortness of breath Negative: Vomiting  Physical Exam  BP 122/78   Pulse 93   Temp 97.8 F (36.6 C)   Resp (!) 22   SpO2 94%  Gen:   Awake, somewhat anxious Resp:  Normal effort, Slight expiratory wheezing bilaterally MSK:   Moves extremities without difficulty  Other:    Medical Decision Making  Medically screening exam initiated at 3:10 AM.  Appropriate orders placed.  Aaron Reyes [facility] was informed that the remainder of the evaluation will be completed by another provider, this initial triage assessment does not replace that evaluation, and the importance of remaining in the ED until their evaluation is complete.     Carlisle Cater, PA-C 06/18/22 [id number]

## 2022-06-18 NOTE — ED Triage Notes (Signed)
Pt c/o chest pain with left arm pain, dizziness, headache, nausea starting last night. Pt and pt son state they left [facility] due to not being seen. Pt has hx of MI. Pt c/o heaviness sitting on his chest.

## 2022-06-18 NOTE — Progress Notes (Addendum)
Telestroke RN NoteSpero Curb [phone] Received call from Dr. Quinn Axe at [phone] (Dr. Quinn Axe called TS prior to the elert stating there may be a code stroke at [facility])  [place] - L upper extremity weakness, L arm and L keg numbness, Dizziness, CP, Slurred speech  mRS 0  No acute stroke intervention

## 2022-06-18 NOTE — ED Notes (Signed)
Pt became agitated in triage. Per interpreter, pt stating he has been waiting for 7 hours and has yet to see a doctor. Pt informed he is still in the triage area and that as soon as we were able we would get him back to see a doctor. Pt refusing to stay. IV removed per pt request.

## 2022-06-19 NOTE — ED Notes (Signed)
Spoke with NP and charge RN regarding pt leaving, AMA paperwork signed and NP made aware of dispo time. Pt alert and oriented and discharged via wheelchair at this time.

## 2022-06-19 NOTE — Progress Notes (Signed)
                                                  Against Medical Advice Patient at this time expresses desire to leave the Hospital immediately, patient has been warned that this is not Medically advisable at this time, and can result in Medical complications like Death and Disability, patient understands and accepts the risks involved and assumes full responsibilty of this decision.  This patient has also been advised that if they feel the need for further medical assistance to return to any available ER or dial [phone].  Informed by Nursing staff that this patient has left care and has signed the form  Against Medical Advice on 06/19/2022 at  0326  hrs.  Gershon Cull BSN MSNA MSN ACNPC-AG Acute Care Nurse Practitioner [place]

## 2022-06-19 NOTE — ED Notes (Signed)
Pt calling out and stating that he does not want to be in the hospital any longer and would like to go home. Pt reports he "feels better." Pt still requiring assistance to and from wheelchair due to weakness and numbness on his left side. Olena Heckle, NP made aware.

## 2022-06-19 NOTE — ED Notes (Signed)
Risks of leaving AMA discussed with pt, using interpreter [phone]. Pt verbalized understanding. Pt to go home with son.

## 2022-06-19 NOTE — ED Notes (Addendum)
Pt requesting to leave AMA, Gershon Cull, NP made aware. Charge nurse notified.

## 2022-06-20 ENCOUNTER — Telehealth: Payer: Self-pay

## 2022-06-20 NOTE — Telephone Encounter (Signed)
Transition Care Management Unsuccessful Follow-up Telephone Call  Date of discharge and from where:  [facility]  Attempts:  1st Attempt  Reason for unsuccessful TCM follow-up call:  Unable to leave message Juanda Crumble, [place] Direct Dial [phone]

## 2022-06-21 NOTE — Telephone Encounter (Signed)
Transition Care Management Unsuccessful Follow-up Telephone Call  Date of discharge and from where:  [facility]  Attempts:  2nd Attempt  Reason for unsuccessful TCM follow-up call:  Left voice message Juanda Crumble, [place] Direct Dial [phone]

## 2022-06-22 NOTE — Telephone Encounter (Signed)
Transition Care Management Unsuccessful Follow-up Telephone Call  Date of discharge and from where:  [facility]   Attempts:  3rd Attempt  Reason for unsuccessful TCM follow-up call:  Left voice message Juanda Crumble, [place] Direct Dial [phone]

## 2022-06-26 ENCOUNTER — Ambulatory Visit: Payer: Medicaid Other | Admitting: Student

## 2022-06-26 NOTE — Progress Notes (Deleted)
  SUBJECTIVE:   CHIEF COMPLAINT / HPI:   ED follow-up: Presented to ED on 06/18/2022 with chest pain and left arm heaviness and numbness concerning for stroke.  CTA head and neck negative.  Neurology advised admission to [facility] for stroke workup  PERTINENT  PMH / PSH: T2DM, HLD, tobacco use, MDD  Past Medical History:  Diagnosis Date   Bilateral sensorineural hearing loss 02/07/2017   Cervicalgia 10/19/2016   Closed fracture of body of sternum 06/09/2018   Diabetes mellitus without complication ([place])    per pt report   Food insecurity 01/02/2019   GERD (gastroesophageal reflux disease)    H. 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   Testicular mass 03/18/2015    OBJECTIVE:  There were no vitals taken for this visit. Physical Exam   ASSESSMENT/PLAN:  There are no diagnoses linked to this encounter. No follow-ups on file. Wells Guiles, DO 06/26/2022, 7:59 AM PGY-***, [facility] {    This will disappear when note is signed, click to select method of visit    :1}

## 2022-06-27 ENCOUNTER — Ambulatory Visit (INDEPENDENT_AMBULATORY_CARE_PROVIDER_SITE_OTHER): Payer: Medicaid Other | Admitting: Student

## 2022-06-27 VITALS — BP 106/62 | HR 81 | Wt 141.4 lb

## 2022-06-27 DIAGNOSIS — E785 Hyperlipidemia, unspecified: Secondary | ICD-10-CM | POA: Diagnosis not present

## 2022-06-27 DIAGNOSIS — H903 Sensorineural hearing loss, bilateral: Secondary | ICD-10-CM

## 2022-06-27 DIAGNOSIS — F339 Major depressive disorder, recurrent, unspecified: Secondary | ICD-10-CM

## 2022-06-27 DIAGNOSIS — Z720 Tobacco use: Secondary | ICD-10-CM | POA: Diagnosis not present

## 2022-06-27 DIAGNOSIS — R531 Weakness: Secondary | ICD-10-CM | POA: Diagnosis present

## 2022-06-27 DIAGNOSIS — K219 Gastro-esophageal reflux disease without esophagitis: Secondary | ICD-10-CM | POA: Diagnosis not present

## 2022-06-27 DIAGNOSIS — E119 Type 2 diabetes mellitus without complications: Secondary | ICD-10-CM | POA: Diagnosis not present

## 2022-06-27 MED ORDER — METFORMIN HCL ER 500 MG PO TB24: 500.0000 mg | ORAL_TABLET | Freq: Two times a day (BID) | ORAL | 0 refills | Status: DC

## 2022-06-27 MED ORDER — ASPIRIN 81 MG PO CHEW: 81.0000 mg | CHEWABLE_TABLET | Freq: Every day | ORAL | 0 refills | Status: DC

## 2022-06-27 MED ORDER — PANTOPRAZOLE SODIUM 40 MG PO TBEC: 40.0000 mg | DELAYED_RELEASE_TABLET | Freq: Two times a day (BID) | ORAL | 0 refills | Status: DC

## 2022-06-27 NOTE — Patient Instructions (Signed)
It was great to see you today! Thank you for choosing [facility] for your primary care. Pinchos Topel [facility] was seen for ED follow up and med refills.  Today we addressed: I have refilled your aspirin and stomach and diabetes medications.  Please see your new med rec attached to this form.  You have an appointment in 1 week to see me.  We will get labs during that appointment and discuss your general medical conditions. I have referred you to neurology for your generalized weakness.  If you miss those calls, I will see what we can do about getting you scheduled with them.  If you haven't already, sign up for My Chart to have easy access to your labs results, and communication with your primary care physician.  I recommend that you always bring your medications to each appointment as this makes it easy to ensure you are on the correct medications and helps [place] not miss refills when you need them.  Please arrive 15 minutes before your appointment to ensure smooth check in process.  We appreciate your efforts in making this happen.  Thank you for allowing me to participate in your care, Wells Guiles, DO 06/27/2022, 11:38 AM PGY-2, [place]

## 2022-06-27 NOTE — Progress Notes (Signed)
SUBJECTIVE:   CHIEF COMPLAINT / HPI:   Patient presents for ED follow-up.  Seen on 06/18/2022 and [facility] ED.  CT head and CTA head and neck normal however patient was started on Plavix and aspirin for concern of stroke. It was advised the patient be admitted for MRI and stroke evaluation and transferred to [facility] for these purposes and consideration of stress test. He was not discharged with these medications, however was already taking ASA at home.   Patient states he needs a refill for all of his medications (diabetes, cholesterol, stomach and aspirin). He needs to know the results of his tests that he got at the ED. He states he was having numbness all over his body, confusion, severe dizziness which led to him proceeding to ED. He states he waited in the hallway (at [facility]) for 7 hours and he was treated very poorly and went to a different hospital. He refused to be transferred back to [facility] and signed out AMA. He does not want to return to [facility]. He states he is always feeling fatigued. He has felt better staying at home after discharge.   Arabic video interpreter used throughout encounter.  Patient's son present during visit.  PERTINENT  PMH / PSH: T2DM, HLD, MDD, PTSD  OBJECTIVE:  BP 106/62   Pulse 81   Wt 141 lb 6.4 oz (64.1 kg)   SpO2 97%   BMI 24.27 kg/m  General: Awake, alert, NAD Neuro: EOMI, appropriately conversational, moves all 4 extremities equally and appropriately  ASSESSMENT/PLAN:  Weakness Assessment & Plan: Transient, I do have concerns that this may be related to panic disorder.  He does appear to be at his baseline and without any symptomatology at this time.  It was recommended the patient be admitted for at least MRI and further evaluation for stroke.  Does have history of TIA.  Referral to neurology placed however given language barrier, this may not be successful.  Should this be the case, our staff may need to establish visit for the patient at  future visit.  Further, patient did not have a bottle of Percocet in his bag.  Upon PDMP review, this has been filled regularly and during his ED visit he tested negative for opiates.  I do not see encounters of provider that is prescribing this.  I would greatly recommend discontinuing this as it is likely in violation of his pain contract and additionally his gabapentin is being taken adequately as well which was discussed under a separate problem.  Orders: -     Ambulatory referral to Neurology  Type 2 diabetes mellitus without complication, without long-term current use of insulin ([place]) Assessment & Plan: Refilled metformin today for 1 month supply.  At next visit, will need A1c.  Kidney function appears to be appropriate on labs from ED visit.  Additionally, he had 2 bottles of gabapentin.  I have consolidated these and instructed him to use only 1 tablet of 300 mg daily.  Orders: -     metFORMIN HCl ER; Take 1 tablet (500 mg total) by mouth 2 (two) times daily with a meal.  Dispense: 60 tablet; Refill: 0 -     Aspirin; Chew 1 tablet (81 mg total) by mouth daily.  Dispense: 30 tablet; Refill: 0  Hyperlipidemia, unspecified hyperlipidemia type Assessment & Plan: Presented with 3 bottles of Crestor 20 mg.  States he was taking 1 from each bottle.  I have removed 1 bottle that was out  of date and combine medications from the other 2 bottles.  He may continue to take 1 daily but I am concerned he was taking too much.  This may be playing a role in his spontaneous symptomatology.  Lipid panel at next visit.   Gastroesophageal reflux disease, unspecified whether esophagitis present Assessment & Plan: Refilled for 1 month supply.  This should be addressed as it is likely not beneficial for him to be on this medication long-term.  Orders: -     Pantoprazole Sodium; Take 1 tablet (40 mg total) by mouth 2 (two) times daily. Needs appointment for further refills.  Dispense: 60 tablet; Refill:  0  Major depressive disorder, recurrent episode with anxious distress ([place]) Assessment & Plan: Presented with Seroquel 25 mg tablet daily bottle, no bottle for Lexapro.  I have discontinued Lexapro from medication list.  He does still have trazodone.   Tobacco abuse Assessment & Plan: It does appear he has started using nicotine patches.  Added to med rec.   Bilateral sensorineural hearing loss Assessment & Plan: Unfortunately, patient still does not have any hearing aid and has to listen directly next to video interpreter in order to hear.  This is one of many concerns that need to be addressed however patient's frequent no-show rate is one of several barriers.    Return in about 1 week (around 07/04/2022).  Wells Guiles, DO 06/27/2022, 5:29 PM PGY-2, [place]

## 2022-06-27 NOTE — Assessment & Plan Note (Signed)
Refilled for 1 month supply.  This should be addressed as it is likely not beneficial for him to be on this medication long-term.

## 2022-06-27 NOTE — Assessment & Plan Note (Signed)
Unfortunately, patient still does not have any hearing aid and has to listen directly next to video interpreter in order to hear.  This is one of many concerns that need to be addressed however patient's frequent no-show rate is one of several barriers.

## 2022-06-27 NOTE — Assessment & Plan Note (Addendum)
Presented with Seroquel 25 mg tablet daily bottle, no bottle for Lexapro.  I have discontinued Lexapro from medication list.  He does still have trazodone.

## 2022-06-27 NOTE — Assessment & Plan Note (Addendum)
Refilled metformin today for 1 month supply.  At next visit, will need A1c.  Kidney function appears to be appropriate on labs from ED visit.  Additionally, he had 2 bottles of gabapentin.  I have consolidated these and instructed him to use only 1 tablet of 300 mg daily.

## 2022-06-27 NOTE — Assessment & Plan Note (Addendum)
Transient, I do have concerns that this may be related to panic disorder.  He does appear to be at his baseline and without any symptomatology at this time.  It was recommended the patient be admitted for at least MRI and further evaluation for stroke.  Does have history of TIA.  Referral to neurology placed however given language barrier, this may not be successful.  Should this be the case, our staff may need to establish visit for the patient at future visit.  Further, patient did not have a bottle of Percocet in his bag.  Upon PDMP review, this has been filled regularly and during his ED visit he tested negative for opiates.  I do not see encounters of provider that is prescribing this.  I would greatly recommend discontinuing this as it is likely in violation of his pain contract and additionally his gabapentin is being taken adequately as well which was discussed under a separate problem.

## 2022-06-27 NOTE — Assessment & Plan Note (Addendum)
Presented with 3 bottles of Crestor 20 mg.  States he was taking 1 from each bottle.  I have removed 1 bottle that was out of date and combine medications from the other 2 bottles.  He may continue to take 1 daily but I am concerned he was taking too much.  This may be playing a role in his spontaneous symptomatology.  Lipid panel at next visit.

## 2022-06-27 NOTE — Assessment & Plan Note (Signed)
It does appear he has started using nicotine patches.  Added to med rec.

## 2022-07-03 NOTE — Progress Notes (Deleted)
  SUBJECTIVE:   CHIEF COMPLAINT / HPI:   Type 2 Diabetes: Home medications include: Metformin 500 mg twice daily. Does endorse compliance. Home glucose monitoring {home testing:315145}.   Hyperlipidemia: Home medications include rosuvastatin 20 mg daily. Endorses compliance. Last lipid panel:  Lab Results  Component Value Date   CHOL 137 09/21/2020   HDL 36 (L) 09/21/2020   LDLCALC 74 09/21/2020   TRIG 153 (H) 09/21/2020   CHOLHDL 3.8 09/21/2020    Patient is not up to date on diabetic eye. Patient is not up to date on diabetic foot exam.   PERTINENT  PMH / PSH: T2DM, HLD, MDD, PTSD  OBJECTIVE:  There were no vitals taken for this visit. Physical Exam   ASSESSMENT/PLAN:  There are no diagnoses linked to this encounter. No follow-ups on file. Wells Guiles, DO 07/03/2022, 9:04 AM PGY-***, [facility] {    This will disappear when note is signed, click to select method of visit    :1}

## 2022-07-04 ENCOUNTER — Ambulatory Visit: Payer: Self-pay | Admitting: Student

## 2022-07-04 DIAGNOSIS — E1169 Type 2 diabetes mellitus with other specified complication: Secondary | ICD-10-CM

## 2022-07-04 DIAGNOSIS — E119 Type 2 diabetes mellitus without complications: Secondary | ICD-10-CM

## 2022-08-13 ENCOUNTER — Other Ambulatory Visit: Payer: Self-pay | Admitting: Student

## 2022-08-13 DIAGNOSIS — E119 Type 2 diabetes mellitus without complications: Secondary | ICD-10-CM

## 2022-09-17 ENCOUNTER — Other Ambulatory Visit: Payer: Self-pay

## 2022-09-17 DIAGNOSIS — K219 Gastro-esophageal reflux disease without esophagitis: Secondary | ICD-10-CM

## 2022-09-22 NOTE — Progress Notes (Unsigned)
  SUBJECTIVE:   CHIEF COMPLAINT / HPI:   Type 2 Diabetes: Home medications include: Metformin ER 24hr tab 1000mg  daily. Does endorse compliance. Home glucose monitoring is performed never. A1c today is 11.3. CBG yesterday was 442. CBGs range between 200s-400s.  Patient states he goes to the urgent care every day to get his glucose checked. Family he has not been eating well because he feels discomfort and nausea. He has lost 4kg. He has been urinating more frequently and felt very thirsty. He is drinking 5-6 bottles of water per day. POC glucose 219. Denies fever or recent sickness. Endorses vomiting 2 days ago, he is always nauseous.   He presents today with Ozempic pen. He went to Urgent Care and states his A1c was high and was given an injection and given this medication. He does not recall when this visit was, it was about last week. He was injected with the Ozempic at that visit.   He also presents with amoxicillin for questionable sinus/ear infection??  Patient is not up to date on diabetic eye. Patient is not up to date on diabetic foot exam.   PERTINENT  PMH / PSH: T2DM, HLD, MDD, PTSD   Patient Care Team: [name], DO as PCP - General (Family Medicine) Raliegh Ip, DO (Family Medicine) OBJECTIVE:  BP 116/72   Pulse 82   Ht 5\' 4"  (1.626 m)   Wt 133 lb 3.2 oz (60.4 kg)   SpO2 98%   BMI 22.86 kg/m  General: Fatigued appearing, NAD HEENT: Normal TMs bilaterally CV: RRR, no murmurs auscultated Pulm: Normal WOB Abdomen: Normoactive bowel sounds, nontender throughout  ASSESSMENT/PLAN:  Type 2 diabetes mellitus without complication, without long-term current use of insulin Assessment & Plan: A1c 11.3, presenting with symptoms concerning for poorly controlled diabetes although point-of-care glucose 219 and trace ketonuria.  Do not feel he requires emergency visit at this time.  Will check CMP today for anion gap and bicarb.  Patient is adamant he does not want  insulin.  Increase metformin to 1000 mg twice daily.  Advised no further use of Ozempic at this time.  Unfortunately he is a questionable historian and I do not have access to urgent care notes to get full story of how he was provided this.  I have also confiscated amoxicillin as he is not taking appropriately and does not have evidence of ear infection.  He is to follow-up with me in 2 days and bring all of his medications.  Orders: -     POCT glycosylated hemoglobin (Hb A1C) -     POCT glucose (manual entry) -     Comprehensive metabolic panel -     POCT urinalysis dipstick -     metFORMIN HCl ER (MOD); Take 1 tablet (1,000 mg total) by mouth 2 (two) times daily with a meal.  Dispense: 180 tablet; Refill: 0 -     Blood Glucose Monitoring Suppl; 1 kit by Does not apply route in the morning and at bedtime.  Dispense: 1 kit; Refill: 0  Nausea -     Ondansetron HCl; Take 1 tablet (4 mg total) by mouth every 8 (eight) hours as needed for nausea or vomiting.  Dispense: 20 tablet; Refill: 0  Return in about 2 days (around 09/26/2022) for diabetes with Dr. Royal Piedra. [name], DO 09/24/2022, 12:46 PM PGY-2, [place] [facility]

## 2022-09-24 ENCOUNTER — Ambulatory Visit: Payer: Medicaid Other | Admitting: Student

## 2022-09-24 ENCOUNTER — Encounter: Payer: Self-pay | Admitting: Student

## 2022-09-24 VITALS — BP 116/72 | HR 82 | Ht 64.0 in | Wt 133.2 lb

## 2022-09-24 DIAGNOSIS — E119 Type 2 diabetes mellitus without complications: Secondary | ICD-10-CM | POA: Diagnosis present

## 2022-09-24 DIAGNOSIS — R110 Nausea: Secondary | ICD-10-CM | POA: Diagnosis not present

## 2022-09-24 LAB — POCT URINALYSIS DIP (MANUAL ENTRY)
Blood, UA: NEGATIVE
Glucose, UA: 250 mg/dL — AB
Leukocytes, UA: NEGATIVE
Nitrite, UA: NEGATIVE
Protein Ur, POC: NEGATIVE mg/dL
Spec Grav, UA: >=1.03 — AB (ref 1.010–1.025)
Urobilinogen, UA: 0.2 E.U./dL
pH, UA: 5 (ref 5.0–8.0)

## 2022-09-24 LAB — POCT GLYCOSYLATED HEMOGLOBIN (HGB A1C): HbA1c, POC (controlled diabetic range): 11.3 % — AB (ref 0.0–7.0)

## 2022-09-24 LAB — GLUCOSE, POCT (MANUAL RESULT ENTRY): POC Glucose: 219 mg/dl — AB (ref 70–99)

## 2022-09-24 MED ORDER — BLOOD GLUCOSE MONITORING SUPPL KIT
1.0000 | PACK | Freq: Two times a day (BID) | 0 refills | Status: AC
Start: 2022-09-24 — End: ?

## 2022-09-24 MED ORDER — ONDANSETRON 4 MG PO TBDP
4.0000 mg | ORAL_TABLET | Freq: Once | ORAL | Status: DC
  Administered 2022-09-24: 4 mg via ORAL

## 2022-09-24 MED ORDER — ONDANSETRON HCL 4 MG PO TABS: 4.0000 mg | ORAL_TABLET | Freq: Three times a day (TID) | ORAL | 0 refills | Status: DC | PRN

## 2022-09-24 MED ORDER — ONDANSETRON 4 MG PO TBDP
4.0000 mg | ORAL_TABLET | Freq: Once | ORAL | Status: DC
Start: 2022-09-24 — End: 2022-09-24

## 2022-09-24 MED ORDER — METFORMIN HCL ER (MOD) 1000 MG PO TB24: 1000.0000 mg | ORAL_TABLET | Freq: Two times a day (BID) | ORAL | 0 refills | Status: DC

## 2022-09-24 NOTE — Patient Instructions (Addendum)
It was great to see you today! Thank you for choosing [facility] for your primary care. Aaron Reyes [facility] was seen for diabetes.  Today we addressed: Take your metformin 2 pills twice a day. I have prescribed you Zofran for nausea. I have prescribed you a glucose monitoring device.   If you haven't already, sign up for My Chart to have easy access to your labs results, and communication with your primary care physician.  We are checking some labs today. If they are abnormal, I will call you. If they are normal, I will send you a MyChart message (if it is active) or a letter in the mail. If you do not hear about your labs in the next 2 weeks, please call the office. I recommend that you always bring your medications to each appointment as this makes it easy to ensure you are on the correct medications and helps [place] not miss refills when you need them. Call the clinic at [phone] if your symptoms worsen or you have any concerns.  You should return to our clinic Return in about 2 days (around 09/26/2022) for diabetes with Dr. Royal Piedra. Please arrive 15 minutes before your appointment to ensure smooth check in process.  We appreciate your efforts in making this happen.  Thank you for allowing me to participate in your care, [name], DO 09/24/2022, 10:54 AM PGY-2, [place] [facility]   ??? ?? ?????? ????? ?????! ????? ??? ?????? [facility] ??????? ???????. ??? ???? ??????? ???? ????? ??? ???? ???????.  ??????? ?????: 1. ????? ????? ?? ??????????? ????? ?? ?????. ??? ???? ?? ?????? ????? ???????. ??? ???? ?? ???? ?????? ????????.  ??? ?? ??? ?? ??? ???? ??????? ?????? ?? My Chart ?????? ?? ?????? ?????? ??? ????? ????????? ?????? ?? ???????? ?? ???? ??????? ??????? ????? ??.  ??? ???? ??? ????????? ?????. ??? ???? ??? ??????? ????? ??. ??? ???? ??????? ????? ?? ????? MyChart (??? ???? ????) ?? ?????? ??? ??????. ??? ?? ???? ?? ???????? ???? ????????? ?????????  ????? ??????? ???????. ???? ?????? ?????? ?????? ?? ?? ???? ??? ??? ???? ?? ????? ?????? ?? ??? ?????? ??????? ??????? ???????? ??? ??? ????? ????? ??? ??????? ????? ????? ?????. ???? ???????? ??? ????? [phone] ??? ?????? ??????? ?? ???? ???? ?? ?????.

## 2022-09-24 NOTE — Assessment & Plan Note (Signed)
A1c 11.3, presenting with symptoms concerning for poorly controlled diabetes although point-of-care glucose 219 and trace ketonuria.  Do not feel he requires emergency visit at this time.  Will check CMP today for anion gap and bicarb.  Patient is adamant he does not want insulin.  Increase metformin to 1000 mg twice daily.  Advised no further use of Ozempic at this time.  Unfortunately he is a questionable historian and I do not have access to urgent care notes to get full story of how he was provided this.  I have also confiscated amoxicillin as he is not taking appropriately and does not have evidence of ear infection.  He is to follow-up with me in 2 days and bring all of his medications.

## 2022-09-24 NOTE — Addendum Note (Signed)
Addended by: Georges Lynch T on: 09/24/2022 02:01 PM   Modules accepted: Orders

## 2022-09-25 LAB — COMPREHENSIVE METABOLIC PANEL
ALT: 29 IU/L (ref 0–44)
AST: 24 IU/L (ref 0–40)
Albumin/Globulin Ratio: 1.7 (ref 1.2–2.2)
Albumin: 4.5 g/dL (ref 4.1–5.1)
Alkaline Phosphatase: 105 IU/L (ref 44–121)
BUN/Creatinine Ratio: 18 (ref 9–20)
BUN: 16 mg/dL (ref 6–24)
Bilirubin Total: 0.5 mg/dL (ref 0.0–1.2)
CO2: 20 mmol/L (ref 20–29)
Calcium: 9.8 mg/dL (ref 8.7–10.2)
Chloride: 99 mmol/L (ref 96–106)
Creatinine, Ser: 0.9 mg/dL (ref 0.76–1.27)
Globulin, Total: 2.6 g/dL (ref 1.5–4.5)
Glucose: 197 mg/dL — ABNORMAL HIGH (ref 70–99)
Potassium: 4.1 mmol/L (ref 3.5–5.2)
Sodium: 136 mmol/L (ref 134–144)
Total Protein: 7.1 g/dL (ref 6.0–8.5)
eGFR: 108 mL/min/{1.73_m2} (ref >59)

## 2022-09-26 ENCOUNTER — Ambulatory Visit (INDEPENDENT_AMBULATORY_CARE_PROVIDER_SITE_OTHER): Payer: Medicaid Other | Admitting: Student

## 2022-09-26 VITALS — BP 114/79 | HR 78 | Wt 134.6 lb

## 2022-09-26 DIAGNOSIS — E119 Type 2 diabetes mellitus without complications: Secondary | ICD-10-CM | POA: Diagnosis present

## 2022-09-26 LAB — GLUCOSE, POCT (MANUAL RESULT ENTRY): POC Glucose: 341 mg/dl — AB (ref 70–99)

## 2022-09-26 NOTE — Patient Instructions (Addendum)
It was great to see you today! Thank you for choosing [facility] for your primary care. Aaron Reyes [facility] was seen for diabetes.  Today we addressed: Your sugars are still poorly controlled.  You have a visit tomorrow at 11 AM with Dr. Raymondo Band.  Please show up 15 minutes early.  If you haven't already, sign up for My Chart to have easy access to your labs results, and communication with your primary care physician.  I recommend that you always bring your medications to each appointment as this makes it easy to ensure you are on the correct medications and helps [place] not miss refills when you need them. Call the clinic at [phone] if your symptoms worsen or you have any concerns.  You should return to our clinic Return in about 1 day (around 09/27/2022) for 11AM diabetes with Dr. Raymondo Band (pharmacy). Please arrive 15 minutes before your appointment to ensure smooth check in process.  We appreciate your efforts in making this happen.  Thank you for allowing me to participate in your care, [name], DO 09/26/2022, 9:28 AM PGY-2, [facility]

## 2022-09-26 NOTE — Progress Notes (Signed)
  SUBJECTIVE:   CHIEF COMPLAINT / HPI:   Type 2 Diabetes: This is a follow-up from 2 days ago, she had polyuria and polydipsia associated with poorly controlled diabetes.  Home medications include: Metformin 1000 mg twice daily. Does endorse compliance. Home glucose monitoring is performed regularly.  A1c checked 2 days ago 11.3. He notes his thirst and urinary frequency have improved since increasing the metformin dosage. He is only having urinate a few times during the day and 1-2 times at night which is improved from prior. 4/8: 207 @ 1:25AM; 145 @8 :15pm; 148 @12 :13AM 4/9: 205 @ 4:13pm; 198 @ 9pm; 187 @1AM  CBG 341; notes he ate an apple and strawberry prior to visit and he has not taken his metformin yet this morning.  Most recent A1Cs:  Lab Results  Component Value Date   HGBA1C 11.3 (A) 09/24/2022   HGBA1C 6.4 03/29/2022   Patient is not up to date on diabetic eye. Patient is not up to date on diabetic foot exam.   PERTINENT  PMH / PSH: T2DM, HLD, MDD, PTSD  Patient Care Team: [name], DO as PCP - General (Family Medicine) Raliegh Ip, DO (Family Medicine) OBJECTIVE:  BP 114/79   Pulse 78   Wt 134 lb 9.6 oz (61.1 kg)   SpO2 100%   BMI 23.10 kg/m  General: Well-groomed, well-appearing, NAD Pulm: Normal WOB Extremities: 2+ radial pulse, cap refill less than 2 seconds  ASSESSMENT/PLAN:  Type 2 diabetes mellitus without complication, without long-term current use of insulin Assessment & Plan: Remains poorly controlled as his nonfasting CBG this a.m. was 341.  Although, his home values are reassuring.  He still remains a candidate for insulin.  He would benefit from thorough education, I have established appointment with Dr. Raymondo Band tomorrow.  Advised patient he should receive some long-acting insulin today given his high glucose and he is about to break his fast.  He adamantly declined.  Advised him to take his metformin as soon as he goes home and continue to check  his glucose levels to provide for tomorrow's visit.  Of note, he does have Ozempic supply at home which may also be utilized in the future.  Orders: -     POCT glucose (manual entry)  Return in about 1 day (around 09/27/2022) for 11AM diabetes with Dr. Raymondo Band (pharmacy). [name], DO 09/26/2022, 9:38 AM PGY-2, [place] [facility]

## 2022-09-26 NOTE — Assessment & Plan Note (Addendum)
Remains poorly controlled as his nonfasting CBG this a.m. was 341.  Although, his home values are reassuring.  He still remains a candidate for insulin.  He would benefit from thorough education, I have established appointment with Dr. Raymondo Band tomorrow.  Advised patient he should receive some long-acting insulin today given his high glucose and he is about to break his fast.  He adamantly declined.  Advised him to take his metformin as soon as he goes home and continue to check his glucose levels to provide for tomorrow's visit.  Of note, he does have Ozempic supply at home which may also be utilized in the future.

## 2022-09-27 ENCOUNTER — Ambulatory Visit: Payer: Medicaid Other | Admitting: Pharmacist

## 2022-09-27 ENCOUNTER — Telehealth: Payer: Self-pay

## 2022-09-27 ENCOUNTER — Encounter: Payer: Self-pay | Admitting: Pharmacist

## 2022-09-27 VITALS — BP 101/71 | HR 80 | Wt 135.6 lb

## 2022-09-27 DIAGNOSIS — E119 Type 2 diabetes mellitus without complications: Secondary | ICD-10-CM

## 2022-09-27 DIAGNOSIS — E785 Hyperlipidemia, unspecified: Secondary | ICD-10-CM

## 2022-09-27 LAB — POCT GLYCOSYLATED HEMOGLOBIN (HGB A1C): HbA1c, POC (controlled diabetic range): 10.9 % — AB (ref 0.0–7.0)

## 2022-09-27 MED ORDER — ROSUVASTATIN CALCIUM 20 MG PO TABS: 20.0000 mg | ORAL_TABLET | Freq: Every day | ORAL | 3 refills | Status: DC

## 2022-09-27 MED ORDER — INSULIN GLARGINE 100 UNIT/ML SOLOSTAR PEN
8.0000 [IU] | PEN_INJECTOR | Freq: Every morning | SUBCUTANEOUS | 11 refills | Status: DC
Start: 2022-09-27 — End: 2022-10-04

## 2022-09-27 NOTE — Telephone Encounter (Signed)
Patient calls nurse line requesting clarification on Lantus.   He reports he is unsure if Lantus is to replace Metformin or if he should be taking both.  Advised Lantus and Metformin in the AM and Metformin in PM.   Patient appreciative.

## 2022-09-27 NOTE — Progress Notes (Signed)
S:    Chief Complaint  Patient presents with   Medication Management    T2DM   47 y.o. male who presents for diabetes evaluation, education, and management.  PMH is significant for DM, HLD, TIA, and tobacco use.  Patient was referred and last seen by Primary Care Provider, Dr. Royal Reyes, on 09/26/2022.   At last visit, A1c was elevated at 11.3%, up from 6.4% in October 2023. Patient deferred insulin at that time.  Today, patient arrives in good spirits and presents with the assistance of an online interpretor Aaron Reyes [phone]). He denies recent infection but endorses increased stress. Reports ongoing fatigue for the past month. Prior to Ramadan, he did not have this fatigue. Reports having 1 dose of Ozempic at urgent care and experienced some nausea. Patient agreeable to using CGM. A1c today 10.9%.  Patient reports Diabetes was diagnosed ~3 years ago and has been on medication the entire time. Insulin naive.    Current diabetes medications include: metformin 1000 mg BID Current hyperlipidemia medications include: rosuvastatin 20 mg daily  Patient reports adherence to taking all medications as prescribed.   Insurance coverage: Medicaid  Patient denies hypoglycemic events.  Reported home fasting blood sugars: 340s  Reported 2 hour post-meal/random blood sugars: yesterday 200s after breakfast, otherwise 100-200s. Denies BG in low 100s.   Patient reports nocturia (nighttime urination). 1-2x/night Patient reports neuropathy (nerve pain). Patient reports visual changes. Worse in past 2 days Patient reports self foot exams.   Patient reported dietary habits: Recently finished Ramadan    O:  Review of Systems  Constitutional:  Positive for malaise/fatigue.  Eyes:  Positive for blurred vision.  All other systems reviewed and are negative.   Physical Exam Constitutional:      Appearance: Normal appearance. He is normal weight.  Pulmonary:     Effort: Pulmonary effort is  normal.  Neurological:     Mental Status: He is alert.  Psychiatric:        Mood and Affect: Mood normal.        Behavior: Behavior normal.        Thought Content: Thought content normal.    Lab Results  Component Value Date   HGBA1C 11.3 (A) 09/24/2022   Vitals:   09/27/22 1130  BP: 101/71  Pulse: 80  SpO2: 98%    Lipid Panel     Component Value Date/Time   CHOL 137 09/21/2020 1659   TRIG 153 (H) 09/21/2020 1659   HDL 36 (L) 09/21/2020 1659   CHOLHDL 3.8 09/21/2020 1659   CHOLHDL 6.1 01/03/2018 0844   VLDL 40 01/03/2018 0844   LDLCALC 74 09/21/2020 1659    Clinical Atherosclerotic Cardiovascular Disease (ASCVD): Yes - TIA The 10-year ASCVD risk score (Arnett DK, et al., 2019) is: 5%   Values used to calculate the score:     Age: 23 years     Sex: Male     Is Non-Hispanic African American: No     Diabetic: Yes     Tobacco smoker: Yes     Systolic Blood Pressure: 101 mmHg     Is BP treated: No     HDL Cholesterol: 36 mg/dL     Total Cholesterol: 137 mg/dL   A/P: [name]:  -Patient provided verbal consent to participate in the study. Consent documented in electronic medical record.  -Provided education on Libre 3 CGM. Collaborated to ensure Aaron Reyes 3 app was downloaded on patient's phone. Educated on how to place sensor every  14 days, patient placed first sensor correctly and verbalized understanding of use, removal, and how to place next sensor. Discussed alarms. 4 sensors provided for a 2 month supply. Educated to contact the office if the sensor falls off early and replacements are needed before their next [employer].  -Diabetes longstanding, currently uncontrolled based on A1c. Patient is able to verbalize appropriate hypoglycemia management plan. Medication adherence appears appropriate. Control is suboptimal due to increased stress and needing therapy intensification. Will defer the use of Ozempic (semaglutide) for now.  -Started basal insulin Lantus  (insulin glargine) 8 units every morning.  -Continued metformin 1000 mg BID.  -Patient educated on purpose, proper use, and potential adverse effects of insulin.  -Extensively discussed pathophysiology of diabetes, recommended lifestyle interventions, dietary effects on blood sugar control.  -Counseled on s/sx of and management of hypoglycemia.  -Next A1c anticipated July 2024.   ASCVD risk - secondary prevention in patient with diabetes. Last LDL is not at goal of <70 mg/dL. moderate intensity statin indicated.  -Continued rosuvastatin 20 mg. Refills sent.   Written patient instructions provided. Patient verbalized understanding of treatment plan.  Total time in face to face counseling 45 minutes.    Follow-up:  Pharmacist 1 week. Patient seen with Aaron Reyes PGY-1 Pharmacy Resident, Aaron Reyes, PharmD Candidate and Aaron Reyes, PharmD, PGY2 Pharmacy Resident.

## 2022-09-27 NOTE — Patient Instructions (Addendum)
??? ????? ?? ???? ?????!  ???? ?? ?? ???? ????? ????? ?? ???? 80-130 ??? ????? ???? ?? 180 ??? ?????.  ??????? ??????: ???? ?????? Lantus 8 ????? ?? ????.  ?? ??????? ???? ????? ?? ???? ?? ?????? ?????? ???? (????? ????? ?? ???? ?? ?????) ??????? ??? ?? ?????? ???????.  ????? ?? ????? ????? ?? ?????? ??????? ??????? ???????. ???? ??? ????? ???? ????? ???? ????????? ???????? ??????? ??????? ?? ?????? (?????? ?????? ?????? ????????). ???? ???? ?? ????? ????? ?? ???? ????? ?????? ??????? ?? ??????? (????? ?? ???????)? ???? ?????? ??????? ??? ????? ??? ???? ?? ??? ????? ??? ???????.  ????? ????????? ???? ?????? ???????  ?? ???? ??????? ???????? ????? ????? ??? "??????" ?? ??????? ???????? ?????? ??? ?????? ?????? ???? ??????? ??? ??????? ????????. ???? ????? ?????? ??? ??????? ??? ????? ????? ???????. 1. ?? ?????? ?????????? ??? ??? ????? ?????? ?? ????? ??????. ??? ?? ???? ?? ????? ????? ??? ?? ???? ???????? ???? ???? ??? ????? ?????? ??? ?????. ???? ??????? ???? ??? ???? ?? ????? ?? ?????? ???? ?? ???. 2. ??? ????? ?? ????? ??? ????? ???? ??? ????? ?????? ??????? ??????? (???? ??? ?? ??). ???? ?????? ???? ????? ???? ????? (2.5 ??) ??? ????? ?? ?? ????? ???. ????? ???????? ?? ???? ?????? ??? ???? ????? ???? ????? ??? ?????? ???? ?? ???????? ??????. 3. ???? ???? ??????? ???????? ????? ????? ?????? ?? ???? ????? ??????. ?????? ??? ?? ????? ?? ????? ????? ?? ???? ???????? ?? ???????? ???? ????. ???? ?????? ??????? ????? ??? ????????. ??????: ??? ?? ???? ??????? ????? ?????? ???? ??? ?? ??? ???????? ??? ??????? ???? ??? ?????? ??????? ??????? ?? ????? ???????? ????? ??. 4. ?? ??? ?????? ?? ???? ??? ???????? ?? ?? ?????? ??????. 5. ?? ???? ????? ???????? ??? ?????? ?????? ????? ????? ???? ?????? ???????? ??? ????. 6. ???? ???? ??? ???????? ???? ?????? ?? ????. ??? ???? ????? ???????? ?????. 7. ???? ?? ?? ???????? ??? ??? ???????. ?? ?????? ??? ???? ??? ???? ??? ????????. ???? ?? ???? ????? ????????  ????????? ????? ?????? ???????.  ???? ?? ??? ???????? ????? ?? ?? ???? ?? ?? ???? ???????? ????? ??? ???? ????? ???? ????? [employer] ??? ????? [phone] ???? 7 ???? ?? ??????? ?? ?????? 8 ?????? ??? 8 ????? ?????? ??? ???????? ???????? ???????? ???? ????? ??? ???? ????? ??????? ?????? ???? ??? 14 ????? ?? ?????????? ????? ?? [facility] ??? ????? [phone]   ??????? 1. ?? ?????? ????? FreeStyle Libre 3 ?? ???? ????????? ?????? 2. ?? ?????? ??????? ???? ????? ???? 3. ?? ?????? ???? 4. ???? ??? ??? ???????? ?????? 5. ???? ????????? ???? ???? ??? ??????. ??? ?? ??? ?????? ????????? ????? ????? ????? ???? ??? ????????. ?? ???? ?????? ?????? ??? ????? ??????. ????? ??? ?? ????? ??????? ???? ????? ???? ???? ??? ???????? ??? ???? ?????? ?????? ???? ???? 14 ?????. 6. ????? ???? ???? ??? ????? 60 ????? ??? ???? ??????? ????? ???????? ????   ????? ?????? ??????? ???? 1. ????? ?????? ??? ???????? ????? ??? ????????? -> ????????? ??????? -> LibreView -> ???? ???? ???????? -> [id number]  ---------------------------------------------------------------------------------------------  It was nice to see you today!  Your goal blood sugar is 80-130 before eating and less than 180 after eating.  Medication Changes: Begin Lantus 8 units every morning.   Monitor blood sugars at home and keep a log (glucometer or piece of paper) to bring with you to your next visit.  Keep up the good work with diet and exercise. Aim for a  diet full of vegetables, fruit and lean meats (chicken, [place], fish). Try to limit salt intake by eating fresh or frozen vegetables (instead of canned), rinse canned vegetables prior to cooking and do not add any additional salt to meals.   Sensor Application Password for LibreView: [id number]  If using the App, you can tap Help in the Main Menu to access an in-app tutorial on applying a Sensor. See below for instructions on how to download the app. Apply Sensors  only on the back of your upper arm. If placed in other areas, the Sensor may not function properly and could give you inaccurate readings. Avoid areas with scars, moles, stretch marks, or lumps.  Select an area of skin that generally stays flat during your normal daily activities (no bending or folding). Choose a site that is at least 1 inch (2.5 cm) away from any injection sites. To prevent discomfort or skin irritation, you should select a different site other than the one most recently used. Wash application site using a plain soap, dry, and then clean with an alcohol wipe. This will help remove any oily residue that may prevent the sensor from sticking properly. Allow site to air dry before proceeding. Note: The area MUST be clean and dry, or the Sensor may not stay on for the full wear duration specified by your Sensor insert. 4. Unscrew the cap from the Sensor Applicator and set the cap aside.  5. Place the Sensor Applicator over the prepared site and push down firmly to apply the Sensor to your body. 6. Gently pull the Sensor Applicator away from your body. The Sensor should now be attached to your skin. 7. Make sure the Sensor is secure after application. Put the cap back on the Sensor Applicator. Discard the used Engineer, agricultural according to local regulations.  What If My Sensor Falls Off or What If My Sensor Isn't Working? Call [employer] Customer Care Team at [phone] Available 7 days a week from 8AM-8PM EST, excluding holidays If yo have multiple sensors fall off prior to 14 days of use, contact [facility] at [phone]   The App Download the FreeStyle [employer] 3 App in your phone's app store  Load the app and select get started now Create an account  Tap scan new sensor Follow the prompts on the screen. If your sensor does not sync, try moving your phone slowly around the sensor. Phone cases may affect scanning. This will be the only time you have to scan the  sensor until you apply a new sensor in 14 days.  There will be a 60 minute start up period until the app will display your glucose reading   How To Share Your Readings With [place] Once in the app, go to settings -> connected apps -> LibreView -> Enter Practice ID -> [id number]

## 2022-09-27 NOTE — Assessment & Plan Note (Signed)
[name]:  -Patient provided verbal consent to participate in the study. Consent documented in electronic medical record.  -Provided education on Libre 3 CGM. Collaborated to ensure [name] 3 app was downloaded on patient's phone. Educated on how to place sensor every 14 days, patient placed first sensor correctly and verbalized understanding of use, removal, and how to place next sensor. Discussed alarms. 4 sensors provided for a 2 month supply. Educated to contact the office if the sensor falls off early and replacements are needed before their next [employer].  -Diabetes longstanding, currently uncontrolled based on A1c. Patient is able to verbalize appropriate hypoglycemia management plan. Medication adherence appears appropriate. Control is suboptimal due to increased stress and needing therapy intensification. Will defer the use of Ozempic (semaglutide) for now.  -Started basal insulin Lantus (insulin glargine) 8 units every morning.  -Continued metformin 1000 mg BID.  -Patient educated on purpose, proper use, and potential adverse effects of insulin.  -Extensively discussed pathophysiology of diabetes, recommended lifestyle interventions, dietary effects on blood sugar control.  -Counseled on s/sx of and management of hypoglycemia.

## 2022-10-02 ENCOUNTER — Ambulatory Visit: Payer: Medicaid Other | Admitting: Pharmacist

## 2022-10-04 ENCOUNTER — Telehealth: Payer: Self-pay

## 2022-10-04 ENCOUNTER — Encounter: Payer: Self-pay | Admitting: Pharmacist

## 2022-10-04 ENCOUNTER — Ambulatory Visit (INDEPENDENT_AMBULATORY_CARE_PROVIDER_SITE_OTHER): Payer: Medicaid Other | Admitting: Pharmacist

## 2022-10-04 ENCOUNTER — Other Ambulatory Visit (HOSPITAL_COMMUNITY): Payer: Self-pay

## 2022-10-04 VITALS — BP 125/94 | HR 73 | Wt 135.2 lb

## 2022-10-04 DIAGNOSIS — E119 Type 2 diabetes mellitus without complications: Secondary | ICD-10-CM | POA: Diagnosis not present

## 2022-10-04 DIAGNOSIS — K219 Gastro-esophageal reflux disease without esophagitis: Secondary | ICD-10-CM

## 2022-10-04 MED ORDER — DAPAGLIFLOZIN PROPANEDIOL 10 MG PO TABS
10.0000 mg | ORAL_TABLET | Freq: Every day | ORAL | 3 refills | Status: DC
Start: 2022-10-04 — End: 2022-11-13

## 2022-10-04 MED ORDER — INSULIN GLARGINE 100 UNIT/ML SOLOSTAR PEN
5.0000 [IU] | PEN_INJECTOR | Freq: Every morning | SUBCUTANEOUS | 11 refills | Status: DC
Start: 2022-10-04 — End: 2022-11-13

## 2022-10-04 MED ORDER — BD PEN NEEDLE NANO 2ND GEN 32G X 4 MM MISC
11 refills | Status: AC
Start: 2022-10-04 — End: ?

## 2022-10-04 MED ORDER — PANTOPRAZOLE SODIUM 40 MG PO TBEC
40.0000 mg | DELAYED_RELEASE_TABLET | Freq: Two times a day (BID) | ORAL | 0 refills | Status: DC
Start: 2022-10-04 — End: 2022-10-30

## 2022-10-04 NOTE — Assessment & Plan Note (Signed)
Diabetes longstanding, currently with improved control . Patient is able to verbalize appropriate hypoglycemia management plan. Medication adherence appears appropriate. Although, patient is very apprehensive to being on insulin. -Decreased dose of basal insulin Lantus (insulin glargine) from 8 units to 5 units. -Started SGLT2-I Farxiga (dapagliflozin) 10 mg. Counseled on sick day rules. -Continued metformin 1000 mg BID.  -Patient educated on purpose, proper use, and potential adverse effects of [place].  -Extensively discussed pathophysiology of diabetes, recommended lifestyle interventions, dietary effects on blood sugar control.  -Counseled on s/sx of and management of hypoglycemia.  -Next A1c anticipated 3 months.

## 2022-10-04 NOTE — Progress Notes (Signed)
S:    Chief Complaint  Patient presents with   Medication Management    DM f/up   47 y.o. male who presents for diabetes evaluation, education, and management.  PMH is significant for DM, HLD, TIA, and tobacco use.  Patient was referred and last seen by Primary Care Provider, Dr. Royal Piedra, on 09/26/2022. Last visit with pharmacy on 09/27/2022. At last visit, Lantus (insulin glargine) 8 units daily and patient was enrolled in LIBERATE CGM study.    Today, patient arrives in good spirits and presents without any assistance and with his wife. Reports he has been taking insulin daily since his last visit. He did throw up after his first injection, but unlikely this is due to insulin. He is still very apprehensive about being on insulin and inquires often about when he will be able to come off.   Patient reports Diabetes was diagnosed ~3 years ago and has been on medication the entire time.  Current diabetes medications include: Lantus (insulin glargine) 8 units QAM, metformin 1000 mg BID Current hyperlipidemia medications include: rosuvastatin 20 mg daily  Patient reports adherence to taking all medications as prescribed.   Insurance coverage: Medicaid   Patient denies symptomatic hypoglycemic events. A few readings 70-75. Appropriately corrected.    Patient denies nocturia (nighttime urination). Improved.  Patient denies neuropathy (nerve pain). Patient reports visual changes. But unchanged from last visit. Trouble reading.  Patient reports self foot exams.   Patient reported dietary habits: Breakfast: cheese, olives Lunch: veggies  Not eating potatoes or rice. Is eating brown bread  O:  Review of Systems  All other systems reviewed and are negative.   Physical Exam Constitutional:      Appearance: Normal appearance. He is normal weight.  Pulmonary:     Effort: Pulmonary effort is normal.  Neurological:     Mental Status: He is alert.  Psychiatric:        Mood and  Affect: Mood normal.        Behavior: Behavior normal.        Thought Content: Thought content normal.        Judgment: Judgment normal.    oad:  % Time CGM is active: 49% (appropriate for 1 week follow-up) Average Glucose: 166 mg/dL Glucose Management Indicator: 7.3%  Glucose Variability: 24.6% (goal <36%) Time in Goal:  - Time in range 70-180: 69% - Time above range: 31% with 29% being 181-250 - Time below range: 0% Observed patterns: Much improved glucose control compared to recent A1C   Lab Results  Component Value Date   HGBA1C 10.9 (A) 09/27/2022   Vitals:   10/04/22 1004  BP: (!) 125/94  Pulse: 73  SpO2: 100%    Lipid Panel     Component Value Date/Time   CHOL 137 09/21/2020 1659   TRIG 153 (H) 09/21/2020 1659   HDL 36 (L) 09/21/2020 1659   CHOLHDL 3.8 09/21/2020 1659   CHOLHDL 6.1 01/03/2018 0844   VLDL 40 01/03/2018 0844   LDLCALC 74 09/21/2020 1659    Clinical Atherosclerotic Cardiovascular Disease (ASCVD): Yes - TIA The 10-year ASCVD risk score (Arnett DK, et al., 2019) is: 7.2%   Values used to calculate the score:     Age: 40 years     Sex: Male     Is Non-Hispanic African American: No     Diabetic: Yes     Tobacco smoker: Yes     Systolic Blood Pressure: 125 mmHg  Is BP treated: No     HDL Cholesterol: 36 mg/dL     Total Cholesterol: 137 mg/dL   A/P: Diabetes longstanding, currently with improved control . Patient is able to verbalize appropriate hypoglycemia management plan. Medication adherence appears appropriate. Although, patient is very apprehensive to being on insulin. -Decreased dose of basal insulin Lantus (insulin glargine) from 8 units to 5 units. -Started SGLT2-I Farxiga (dapagliflozin) 10 mg. Counseled on sick day rules. -Continued metformin 1000 mg BID.  -Patient educated on purpose, proper use, and potential adverse effects of [place].  -Extensively discussed pathophysiology of diabetes, recommended lifestyle interventions,  dietary effects on blood sugar control.  -Counseled on s/sx of and management of hypoglycemia.  -Next A1c anticipated 3 months.   ASCVD risk - secondary prevention in patient with diabetes. Last LDL is 74, close to goal of <70 mg/dL.moderate intensity statin indicated.  -Continued rosuvastatin 20 mg.   Written patient instructions provided. Patient verbalized understanding of treatment plan.  Total time in face to face counseling 35 minutes.    GERD stable.  Requested refill Refill x1 provided.   Follow-up:  Pharmacist 1 weeks. Patient seen with Earl Gala PGY-1 Pharmacy Resident, Revonda Standard, PharmD Candidate and Valeda Malm, PharmD, PGY2 Pharmacy Resident.

## 2022-10-04 NOTE — Telephone Encounter (Signed)
Patient calls nurse line reporting difficulties picking up prescription sent in today.   I called the pharmacy and pharmacy reports all prescriptions are going through Medicaid. Pharmacy reports they will get prescriptions ready for patient.   Attempted to call patient back with an interpreter, however no answer or option for VM.   At this time all prescriptions should be able to be picked up.

## 2022-10-04 NOTE — Patient Instructions (Addendum)
??? ????? ?? ???? ?????!  ???? ?? ?? ???? ????? ????? ?? ????   80-130 ??? ????? ???? ?? 180 ??? ?????.  ??????? ??????: ??? ?????? ??? 5 ????? ?????? ???? ?????? Farxiga 10 mg ??????  ?? ??????? ???? ????? ?? ???? ?? ?????? ?????? ???? (????? ????? ?? ???? ?? ?????) ??????? ??? ?? ?????? ???????.  ????? ?? ????? ????? ?? ?????? ??????? ??????? ???????. ???? ??? ????? ???? ????? ???? ????????? ???????? ??????? ??????? ?? ?????? (?????? ?????? ?????? ????????). ???? ???? ?? ????? ????? ?? ???? ????? ?????? ??????? ?? ??????? (????? ?? ???????)? ???? ?????? ??????? ??? ????? ??? ???? ?? ??? ????? ??? ???????.         It was nice to see you today!  Your goal blood sugar is 80-130 before eating and less than 180 after eating.  Medication Changes: Decrease Lantus to 5 units daily Start Farxiga 10 mg daily  Monitor blood sugars at home and keep a log (glucometer or piece of paper) to bring with you to your next visit.  Keep up the good work with diet and exercise. Aim for a diet full of vegetables, fruit and lean meats (chicken, [place], fish). Try to limit salt intake by eating fresh or frozen vegetables (instead of canned), rinse canned vegetables prior to cooking and do not add any additional salt to meals.

## 2022-10-04 NOTE — Progress Notes (Signed)
Reviewed and agree with Dr Koval's plan.   

## 2022-10-12 ENCOUNTER — Ambulatory Visit: Payer: Medicaid Other | Admitting: Pharmacist

## 2022-10-16 ENCOUNTER — Ambulatory Visit: Payer: Medicaid Other

## 2022-10-19 ENCOUNTER — Other Ambulatory Visit: Payer: Self-pay | Admitting: Student

## 2022-10-19 DIAGNOSIS — R110 Nausea: Secondary | ICD-10-CM

## 2022-10-30 ENCOUNTER — Ambulatory Visit (INDEPENDENT_AMBULATORY_CARE_PROVIDER_SITE_OTHER): Payer: Medicaid Other | Admitting: Student

## 2022-10-30 ENCOUNTER — Other Ambulatory Visit: Payer: Self-pay | Admitting: Student

## 2022-10-30 ENCOUNTER — Encounter: Payer: Self-pay | Admitting: Student

## 2022-10-30 VITALS — BP 110/64 | HR 92 | Ht 64.0 in | Wt 132.0 lb

## 2022-10-30 DIAGNOSIS — R110 Nausea: Secondary | ICD-10-CM

## 2022-10-30 DIAGNOSIS — E119 Type 2 diabetes mellitus without complications: Secondary | ICD-10-CM

## 2022-10-30 DIAGNOSIS — K219 Gastro-esophageal reflux disease without esophagitis: Secondary | ICD-10-CM

## 2022-10-30 MED ORDER — ONDANSETRON HCL 4 MG PO TABS
4.0000 mg | ORAL_TABLET | Freq: Three times a day (TID) | ORAL | 0 refills | Status: DC | PRN
Start: 2022-10-30 — End: 2023-01-30

## 2022-10-30 MED ORDER — PANTOPRAZOLE SODIUM 40 MG PO TBEC
40.0000 mg | DELAYED_RELEASE_TABLET | Freq: Two times a day (BID) | ORAL | 0 refills | Status: DC
Start: 2022-10-30 — End: 2022-11-13

## 2022-10-30 NOTE — Assessment & Plan Note (Signed)
Most recent libre report shows A1c correlation of 6.6% with glucose variability of 24%.  This is a massive strive for that is likely correlated to dietary changes and consistent medication use.  Given he has not been taking his insulin recently, I am recommending he hold off on taking it for now with such good glucose control. - Medications: Continue metformin 1000 mg twice daily, Farxiga 10 mg daily -Did discuss with Dr. Raymondo Band it appears that patient may be getting metformin medications from various locations per pharmacy report. -Follow-up with Dr. Raymondo Band in 2 weeks for further discussion of changes and [name] use and addition to med rec

## 2022-10-30 NOTE — Patient Instructions (Addendum)
??? ?? ?????? ????? ?????! ????? ??? ??????   [facility] ??????? ???????. ??? ???? ??????? ???? ????? ??? ???? ???????.  ??????? ?????: 1. ???? ?????? ??? ????? ?????? ????????. ??? ?? ???? ?????? ????? ?????????? ??? ????? ??? ?????? ??? ????? ?? ??????????? ???????????. ???? ??????????? ???? ??? ?? ???????? ?? 1000 ??? ????? ??????. ???? ?????? ?? ??? ???? ???. ??? ??? ???? ??? ??????? ?? ????????? ????????. ??? ???? ?? ?????? ?? ??????? ????? ??? ??? ????? ???? ?????? ??? ???? ????? ?????.  ??? ?? ??? ?? ??? ???? ??????? ?????? ?? My Chart ?????? ?? ?????? ?????? ??? ????? ????????? ?????? ?? ???????? ?? ???? ??????? ??????? ????? ??.  ???? ?????? ?????? ?????? ?? ?? ???? ??? ??? ???? ?? ????? ?????? ?? ??? ?????? ??????? ??????? ???????? ??? ??? ????? ????? ??? ??????? ????? ????? ?????.  ??? ???? ?????? ??? ??????? ?? ???? ??????? ??????? (????? 28/10/2022). ???? ?????? ??? 15 ????? ?? ????? ????? ????? ????? ?????? ??????. ???? ???? ?????? ?? ????? ???. kan min alraayie ruyatuk alyawma! nashkuruk ealaa [facility] lirieayatik al'awaliati. tamat ruyat 'iibrahim 'ahmad hijazi wahu musab bialsukari. tanawalna alyawma: 1. 'aqum bi'iieadat mal' 'adwiat almaeidat walghathayani. 'ana la 'aqum bi'iieadat milyiha bial'ansulin li'ana alsukar yatimu altahakum fih jydan fi almitfwrmin walfarkisija. jureat almitafwrmin alati yajib 'an tatanawalaha hi 1000 milaghu maratayn ywmyan. yurjaa alta'akud min 'anak takhudh hadha. 'araa 'anak tahsul ealaa al'adwiat min alsaydaliaat almukhtalifati. laqad hudidat lak mwedan mae alsaydali walakiniy 'awadu mink 'iihdar jamie 'adwiatik hataa yumkin tawdih al'amri. 'iidha lam takun qad qumt bidhalik bialfiel, fashtarik fi My Chart litatamakan min alwusul bisuhulat 'iilaa natayij almukhtabarat alkhasat bik waltawasul mae tabib alrieayat al'awaliat Herminio Heads bika. 'uwasiy bi'iihdar 'adwiatik dayman fi kuli maweid li'ana hadha yajeal min alsahl alta'akud  min 'anak tatanawal al'adwiat alsahihat wayusaeiduna ealaa eadam tafwit 'iieadat sarf al'adwiat eindama tahtaj 'iilayha. yajib ealayk aleawdat 'iilaa eiadatina fi ghudun 'usbueayn tqryban (hwalay 28/10/2022). yurjaa alwusul qabl 15 daqiqatan min maweidik lidaman eamaliat tasjil alwusul bisalasatin. wanahn nuqadir juhudakum fi tahqiq dhalika.   It was great to see you today! Thank you for choosing [facility] for your primary care. Jupiter Reichow [facility] was seen for diabetes.  Today we addressed: I am refilling your stomach and nausea medications.  I am not refilling her insulin because you are sugar is very well-controlled on the metformin and [place].  Metformin dosage you should be taking is 1000 mg twice daily.  Please ensure you are taking this.  I see that you are getting medications from various pharmacies.  I have scheduled you an appointment with our pharmacist but I would like you to bring all of your medications to so this can be cleared up.  If you haven't already, sign up for My Chart to have easy access to your labs results, and communication with your primary care physician.  I recommend that you always bring your medications to each appointment as this makes it easy to ensure you are on the correct medications and helps [place] not miss refills when you need them.  You should return to our clinic Return in about 2 weeks (around 11/13/2022). Please arrive 15 minutes before your appointment to ensure smooth check in process.  We appreciate your efforts in making this happen.  Thank you for allowing me to participate in your care, [name], DO 10/30/2022, 2:58 PM PGY-2, [facility]

## 2022-10-30 NOTE — Progress Notes (Signed)
  SUBJECTIVE:   CHIEF COMPLAINT / HPI:   Type 2 Diabetes: Home medications include: Farxiga 10 mg daily, Lantus 5 units daily, metformin 1000 mg twice daily. Patient notes he stopped taking insulin. He is only taking pills. He cannot state which medication it is. He only takes one pill a day. He cannot clearly state which medications he is taking other than stomach pill, diabetes pill, and aspirin.  States he takes metformin 2 pills twice daily but these are the 500 mg pills.  Patient is not up to date on diabetic eye. Patient is not up to date on diabetic foot exam.   PERTINENT  PMH / PSH: T2DM, HLD, MDD, PTSD   Patient Care Team: [name], DO as PCP - General (Family Medicine) Raliegh Ip, DO (Family Medicine) OBJECTIVE:  BP 110/64   Pulse 92   Ht 5\' 4"  (1.626 m)   Wt 132 lb (59.9 kg)   SpO2 100%   BMI 22.66 kg/m  General: Well-appearing, NAD Psych: Normal mood and affect  ASSESSMENT/PLAN:  Type 2 diabetes mellitus without complication, without long-term current use of insulin (HCC) Assessment & Plan: Most recent libre report shows A1c correlation of 6.6% with glucose variability of 24%.  This is a massive strive for that is likely correlated to dietary changes and consistent medication use.  Given he has not been taking his insulin recently, I am recommending he hold off on taking it for now with such good glucose control. - Medications: Continue metformin 1000 mg twice daily, Farxiga 10 mg daily -Did discuss with Dr. Raymondo Band it appears that patient may be getting metformin medications from various locations per pharmacy report. -Follow-up with Dr. Raymondo Band in 2 weeks for further discussion of changes and libre use and addition to med rec   Gastroesophageal reflux disease, unspecified whether esophagitis present -     Pantoprazole Sodium; Take 1 tablet (40 mg total) by mouth 2 (two) times daily.  Dispense: 60 tablet; Refill: 0  Nausea -     Ondansetron HCl; Take 1  tablet (4 mg total) by mouth every 8 (eight) hours as needed for nausea or vomiting.  Dispense: 20 tablet; Refill: 0  Return in about 2 weeks (around 11/13/2022). [name], DO 10/30/2022, 3:02 PM PGY-2, [facility]

## 2022-11-13 ENCOUNTER — Ambulatory Visit (INDEPENDENT_AMBULATORY_CARE_PROVIDER_SITE_OTHER): Payer: Medicaid Other | Admitting: Pharmacist

## 2022-11-13 ENCOUNTER — Encounter: Payer: Self-pay | Admitting: Pharmacist

## 2022-11-13 VITALS — BP 113/73 | HR 81 | Ht 65.63 in | Wt 131.6 lb

## 2022-11-13 DIAGNOSIS — E119 Type 2 diabetes mellitus without complications: Secondary | ICD-10-CM

## 2022-11-13 DIAGNOSIS — K219 Gastro-esophageal reflux disease without esophagitis: Secondary | ICD-10-CM

## 2022-11-13 DIAGNOSIS — Z720 Tobacco use: Secondary | ICD-10-CM

## 2022-11-13 MED ORDER — METFORMIN HCL ER (MOD) 1000 MG PO TB24
1000.0000 mg | ORAL_TABLET | Freq: Two times a day (BID) | ORAL | 0 refills | Status: DC
Start: 2022-11-13 — End: 2022-11-13

## 2022-11-13 MED ORDER — PANTOPRAZOLE SODIUM 40 MG PO TBEC
40.0000 mg | DELAYED_RELEASE_TABLET | Freq: Two times a day (BID) | ORAL | 0 refills | Status: DC
Start: 2022-11-13 — End: 2023-01-30

## 2022-11-13 MED ORDER — INSULIN GLARGINE 100 UNIT/ML SOLOSTAR PEN
5.0000 [IU] | PEN_INJECTOR | Freq: Every morning | SUBCUTANEOUS | 11 refills | Status: DC
Start: 2022-11-13 — End: 2023-07-30

## 2022-11-13 MED ORDER — METFORMIN HCL 1000 MG PO TABS: 1000.0000 mg | ORAL_TABLET | Freq: Two times a day (BID) | ORAL | 3 refills | Status: DC

## 2022-11-13 MED ORDER — DAPAGLIFLOZIN PROPANEDIOL 10 MG PO TABS
10.0000 mg | ORAL_TABLET | Freq: Every day | ORAL | 3 refills | Status: DC
Start: 2022-11-13 — End: 2023-03-25

## 2022-11-13 MED ORDER — ASPIRIN 81 MG PO CHEW
81.0000 mg | CHEWABLE_TABLET | Freq: Every day | ORAL | 0 refills | Status: DC
Start: 2022-11-13 — End: 2023-05-02

## 2022-11-13 MED ORDER — VARENICLINE TARTRATE 0.5 MG PO TABS
0.5000 mg | ORAL_TABLET | Freq: Every day | ORAL | 3 refills | Status: DC
Start: 2022-11-13 — End: 2023-01-30

## 2022-11-13 NOTE — Progress Notes (Signed)
S:     Chief Complaint  Patient presents with   Medication Management    T2DM and [name] f/u    47 y.o. male who presents for diabetes evaluation, education, and management.  PMH is significant for T2DM, HLD, and Tobacco Use.  Patient was referred and last seen by Primary Care Provider, Dr. Royal Piedra, on 09/26/2022. Last visit with pharmacy on 10/04/2022. .   Today, patient arrives in good spirits. Patient seen with use of virtual translator (ID: [phone])  Patient reports Diabetes was diagnosed approximately 3 years ago and has been on medication the entire time.   Current diabetes medications include: Lantus (insulin glargine) 5 units QAM, metformin 1000 mg BID, dapagliflozin (Farxiga) 10 mg daily  Current hypertension medications include: none Current hyperlipidemia medications include: rosuvastatin 20 mg daily   Patient reports adherence to taking all medications as prescribed.   Do you feel that your medications are working for you? yes Have you been experiencing any side effects to the medications prescribed? no Do you have any problems obtaining medications due to transportation or finances? no Insurance coverage: Medicaid  Patient reports hypoglycemic events.Patient reports symptomatic hypoglycemic events 2-3 times in the past month.   Patient denies nocturia (nighttime urination).  Patient denies neuropathy (nerve pain). Patient reports visual changes. Reports challenges with reading.  Patient reports self foot exams.    O:   Review of Systems  All other systems reviewed and are negative.   Physical Exam Constitutional:      Appearance: Normal appearance.  Neurological:     Mental Status: He is alert.  Psychiatric:        Mood and Affect: Mood normal.        Behavior: Behavior normal.        Thought Content: Thought content normal.    CGM Download:  % Time CGM is active: 94% Average Glucose: 141 mg/dL Glucose Management Indicator: 6.7%  Glucose  Variability: 25.1 (goal <36%) Time in Goal:  - Time in range 70-180: 83% - Time above range: 17% - Time below range: 0% Observed patterns:   Lab Results  Component Value Date   HGBA1C 10.9 (A) 09/27/2022   Vitals:   11/13/22 1421  BP: 113/73  Pulse: 81  SpO2: 100%    Lipid Panel     Component Value Date/Time   CHOL 137 09/21/2020 1659   TRIG 153 (H) 09/21/2020 1659   HDL 36 (L) 09/21/2020 1659   CHOLHDL 3.8 09/21/2020 1659   CHOLHDL 6.1 01/03/2018 0844   VLDL 40 01/03/2018 0844   LDLCALC 74 09/21/2020 1659    Clinical Atherosclerotic Cardiovascular Disease (ASCVD): No  The 10-year ASCVD risk score (Arnett DK, et al., 2019) is: 6.1%   Values used to calculate the score:     Age: 59 years     Sex: Male     Is Non-Hispanic African American: No     Diabetic: Yes     Tobacco smoker: Yes     Systolic Blood Pressure: 113 mmHg     Is BP treated: No     HDL Cholesterol: 36 mg/dL     Total Cholesterol: 137 mg/dL   Patient is participating in a Managed Medicaid Plan:  Yes   A/P: Diabetes longstanding, currently with improved control . Patient is able to verbalize appropriate hypoglycemia management plan. Medication adherence appears appropriate. Control is optimal per CGM report (time in target range: 83%; time above target: 17%; average glucose: 141 mg/dL; GMI:  6.7%).  -Continued Lantus (insulin glargine) 5 units QAM, metformin 1000 mg BID, dapagliflozin (Farxiga) 10 mg daily. -Provided patient with two Libre 3 Sensors from [facility].  -Next A1c anticipated on 12/04/2022 during next pharmacy visit (last A1c at 10.9% from 09/27/2022).   Hyperlipidemia - appears adherent with stating therapy. -Continued rosuvastatin 20 mg daily.  -Per patient's request, refilled aspirin and pantoprazole in addition to dapagliflozin Marcelline Deist), insulin glargine (Lantus), and metformin.   -In collaboration with Dr. Royal Piedra, provided assistance on scheduling patient with an appointment  with an ophthalmologist to evaluate patient's vision as required for his driver's license renewal. Team will follow-up with patient regarding an earlier appointment and other documentation updates.     Tobacco Use disorder - smoking cessation currently in the action stage of quit attempt.  Made progress with intake reduction with use of nicotine patch.  Current intake is ~ 5 cigs per day. Patient voiced willingness to quit smoking during today's visit.  Interest in starting oral therapy for complete cessation today. -Initiated varenicline (Chantix) 0.5 mg once daily with food. Discussed proper use and potential side effects associated with varenicline.  -Goal set to reduce smoking by 3 cigarettes per day in the next week, 2 cigarettes per day in two weeks, and fully quit smoking in 3 weeks.  -Patient's target quit date is: 12/04/2022   Written patient instructions provided. Patient verbalized understanding of treatment plan.  Total time in face to face counseling 38 minutes.    Follow-up:  Pharmacist 12/04/2022 at 3:00 PM. No upcoming PCP visit.  Patient seen with Bing Plume, PharmD Candidate.

## 2022-11-13 NOTE — Assessment & Plan Note (Signed)
Tobacco Use disorder - smoking cessation currently in the action stage of quit attempt.  Made progress with intake reduction with use of nicotine patch.  Current intake is ~ 5 cigs per day. Patient voiced willingness to quit smoking during today's visit.  Interest in starting oral therapy for complete cessation today. -Initiated varenicline (Chantix) 0.5 mg once daily with food. Discussed proper use and potential side effects associated with varenicline.  -Goal set to reduce smoking by 3 cigarettes per day in the next week, 2 cigarettes per day in two weeks, and fully quit smoking in 3 weeks.  -Patient's target quit date is: 12/04/2022

## 2022-11-13 NOTE — Assessment & Plan Note (Signed)
Diabetes longstanding, currently with improved control . Patient is able to verbalize appropriate hypoglycemia management plan. Medication adherence appears appropriate. Control is optimal per CGM report (time in target range: 83%; time above target: 17%; average glucose: 141 mg/dL; GMI: 1.6%).  -Continued Lantus (insulin glargine) 5 units QAM, metformin 1000 mg BID, dapagliflozin (Farxiga) 10 mg daily. -Provided patient with two Libre 3 Sensors from [facility].

## 2022-11-13 NOTE — Patient Instructions (Addendum)
It was nice to see you today!  Your goal blood sugar is 80-130 before eating and less than 180 after eating.  Medication Changes:  Start taking Varenicline (CHANTIX) 0.5 mg once daily with food for smoking cessation.  Continue taking Lantus (insulin glargine) 5 units daily, metformin 1000 mg twice daily, dapagliflozin (Farxiga) 10 mg daily, and rosuvastatin 20 mg daily.  Monitor blood sugars at home and keep a log (glucometer or piece of paper) to bring with you to your next visit.  Keep up the good work with diet and exercise. Aim for a diet full of vegetables, fruit and lean meats (chicken, [place], fish). Try to limit salt intake by eating fresh or frozen vegetables (instead of canned), rinse canned vegetables prior to cooking and do not add any additional salt to meals.    --------   Tobacco Patient Instructions  Quitting smoking is one of the most important decisions you can make for your current and future health. Consider what you dislike about smoking and how quitting could personally benefit you. Try to cut down. Aim for reducing the amount you smoke by 3 cigarettes per day next week, 2 cigarettes per day in two weeks and fully quit smoking in 3 weeks.   My target quit date is: 12/04/2022  Starting today, Be a Quitter!  Remind yourself why you want to quit.  Delay your first cigarette of the day for as long as possible.  Start cleaning out all pockets, drawers, and your car of cigarettes.  Getting Through the Cravings Once You Are Smoke Free: Each craving will last about 10 minutes, whether or not you smoke. Here's how to get through the cravings without cigarettes:  DELAY: Tell yourself that you'll wait for the next craving. Do it every time! DEEP BREATHS: One reason smoking feels good is because you breathe in deeply to inhale. Take four slow, deep breaths and feel the relaxation without the hamful effects of cigarettes. DRINK WATER: Drink a glass of cool water. It will  give your hands and mouth something to do and will help flush the nicotine out of your system faster. DIVERT: Do something else -- brush your teeth, take a walk, call a friend who can offer you support. Just moving onto something other than thinking about cigarettes will move you through the craving.   Frequently Asked Questions  What can I do when I get the urge to smoke? To get through the urge to smoke, try the following:  Review your reasons for quitting and think of all the benefits to your health, your finances, and your family.  Remind yourself that there is no such thing as just one cigarette -- or even one puff.  Ride out the desire to smoke. Use the 4 Os -- Delay, Deep Breaths, Drink Water and Divert to get you through. The craving will go away eventually. Do not fool yourself into thinking you can have just one cigarette.  Any tips on how to deal with stress? Stress is a natural part of life. The key is to deal with it without reaching for a cigarette. Taking deep breaths, counting backwards from 10 and asking yourself 1-how big a deal is this?"  Writing down your feelings, talking with a friend and doing things like positive self-talk and meditation are some other ways that people deal with daily stress.  What if I start smoking again? Slips happen. Most people try to quit smoking a few times before they are successful. Don't beat  yourself up if this happens to you! Ask yourself if this was a slip or a relapse. A slip is a one-time mistake that is quickly corrected. A relapse is going back to your old smoking habits.   If you slip, don't give up. Think of it as a learning experience. Ask yourself what went wrong and renew your commitment to staying away from smoking for good.  If you relapse, try not to get discouraged. Ask yourself the question "What caused me to start smoking?" Figure out what helped you and what didn't when you tried to quit. Knowing why you relapsed is useful  information for your next attempt to quit.    -------  ??? ????? ?? ???? ?????!  ???? ?? ?? ???? ????? ????? ?? ???? 80-130 ??? ????? ???? ?? 180 ??? ?????.  ??????? ??????:  ???? ?????? ??????????? (???????) 0.5 ??? ??? ????? ?????? ?? ?????? ??????? ?? ???????.  ????? ?? ????? ?????? (??????? ???????) 5 ????? ??????? ???????????? 1000 ??? ????? ??????? ?????????????? (????????) 10 ??? ??????? ??????????????? 20 ??? ??????.  ?? ??????? ???? ????? ?? ???? ?? ?????? ?????? ???? (????? ????? ?? ???? ?? ?????) ??????? ??? ?? ?????? ???????.  ????? ?? ????? ????? ?? ?????? ??????? ??????? ???????. ???? ??? ????? ???? ????? ???? ????????? ???????? ??????? ??????? ?? ?????? (?????? ?????? ?????? ????????). ???? ????  \ ?? ????? ????? ?? ???? ????? ???????? ??????? ?? ??????? (????? ?? ???????)? ???? ???????? ??????? ??? ?????? ??? ???? ?? ??? ????? ??? ???????.   ----   ??????? ????? ?????  ??? ??????? ?? ??????? ??? ??? ???????? ???? ????? ??????? ????? ??????? ???????????. ??? ?? ?? ?? ????? ?? ??????? ???? ???? ?? ????? ??????? ?? ??????? ??????. ???? ?? ????. ???? ??? ????? ?????? ???? ?????? ?????? 3 ????? ?????? ?? ??????? ??????? ????????? ?????? ???? ??????? ???????? ?? ??????? ?????? ???? 3 ??????.   ????? ??????? ?? ??????? ???????? ??: 18/11/2022  ????? ?? ?????? ?? ???????!  ???? ???? ???? ????? ?? ??????? ?? ???????.  ?? ?????? ??????? ?????? ?? ????? ????? ???? ?????.  ???? ?????? ???? ?????? ???????? ??????? ?? ???????.  ?????? ??? ?????? ??????? ?? ??????? ????? ??????? ?? ???????: ??? ????? ?? ???? ?? ??????? ???? 10 ????? ???????? ???? ??? ?????? ?? ??. ???? ????? ?????? ??? ?????? ??????? ???? ?????:  ???????: ???? ???? ??? ?????? ?????? ???????. ?????? ?? ?? ???! ?????? ??????: ??? ??????? ???? ???? ??????? ????? ????????? ?? ??? ?????? ???? ???????. ?? ????? ????? ????? ?????? ????? ?????????? ??? ????????? ?????? ???????. ??? ?????: ??? ??? ?? ????? ??????.  ??? ???? ???? ???? ????? ?????? ??????? ?? ??? ????????? ?? ????? ???? ????. ?????: ???? ????? ??? - ??? ??????? ????? ???? ????? ????? ????? ????? ??. ???? ???????? ??? ??? ??? ??? ??????? ?? ??????? ??? ????? ???? ?????? ???????.   ????? ?????  ???? ?????? ?? ???? ????? ???? ??????? ?? ???????? ?????? ??? ?????? ?? ???????? ??? ?? ???:  ???? ????? ??????? ?? ??????? ???? ?? ?? ??????? ???? ????? ??? ???? ??????? ???????.  ???? ???? ??? ?? ???? ??? ???? ?????? ????? ??? - ?? ??? ???? ?????.  ???? ?? ?????? ?? ???????. ?????? 4 ????? - ???????? ??????? ???????? ???? ?????? ????? ????? ?????? ??? ?????. ??? ????? ?????? ?? ???????. ?? ???? ???? ????????? ??? ????? ?????? ??? ?????? ????? ???.  ?? ?? ????? ??? ????? ??????? ?? ??????? ??????? ?? ??? ????? ?? ??????. ??????? ?? ??????? ???? ??? ?????? ??? ??????. ?? ????? ??????? ??? ???????? ??  10 ????? ????: 1- ?? ??? ??? ???????  ?? ????? ?????? ??????? ?? ???? ??????? ?????? ??? ?????? ?????? ???????? ??????? ?? ??? ????? ?????? ???? ?????? ??? ??????? ?? ?????? ??????.   ???? ?? ???? ??????? ??? ????? ???? ????. ????? ???? ????? ??????? ?? ??????? ??? ???? ??? ?? ??????. ?? ???? ???? ??? ??? ?? ???! ???? ???? ??? ??? ??? ??? ?? ???????. ???????? ?? ??? ???? ???? ????? ???? ?????? ?????. ???????? ?? ?????? ??? ????? ??????? ???????.   ??? ??????? ??? ??????. ??? ?? ????? ?????? ???????. ???? ???? ?? ????? ???? ??? ???? ??????? ????????? ?? ??????? ??? ?????.  ??? ????? ?????????? ???? ??? ???? ????????. ???? ???? ?????? "?? ???? ????? ???? ?????????" ????? ?? ???? ????? ??? ???? ?? ?????? ????? ????? ??????? ?? ???????. ?? ????? ??? ????????? ?? ??????? ????? ???????? ??????? ??????? ?? ???????. 

## 2022-11-15 ENCOUNTER — Other Ambulatory Visit (HOSPITAL_COMMUNITY): Payer: Self-pay

## 2022-11-16 NOTE — Progress Notes (Signed)
Reviewed and agree with Dr Koval's plan.   

## 2022-11-20 ENCOUNTER — Ambulatory Visit: Payer: Medicaid Other | Admitting: Family Medicine

## 2022-11-20 NOTE — Progress Notes (Deleted)
    SUBJECTIVE:   CHIEF COMPLAINT / HPI:  No chief complaint on file.   Arabic video interpreter present.  Patient was seen by clinical pharmacist 1 week ago for diabetes, continued on Lantus 5 units daily, metformin 1000 mg twice daily, empagliflozin 10 mg daily.  He has a CGM.  However, at recent PCP visit 2 weeks prior to that on 5/14, it was noted that he was not consistently taking his insulin and was advised to hold off on the insulin.  PERTINENT  PMH / PSH: T2DM  Patient Care Team: [name], DO as PCP - General (Family Medicine) Raliegh Ip, DO (Family Medicine)   OBJECTIVE:   There were no vitals taken for this visit.  Physical Exam      10/30/2022    2:26 PM  Depression screen PHQ 2/9  Decreased Interest 0  Down, Depressed, Hopeless 0  PHQ - 2 Score 0     {Show previous vital signs (optional):23777}  {Labs  Heme  Chem  Endocrine  Serology  Results Review (optional):23779}  ASSESSMENT/PLAN:   No problem-specific Assessment & Plan notes found for this encounter.    No follow-ups on file.   Littie Deeds, MD [facility]

## 2022-11-21 ENCOUNTER — Telehealth: Payer: Self-pay | Admitting: Student

## 2022-11-21 NOTE — Telephone Encounter (Signed)
Opened in error

## 2022-11-27 ENCOUNTER — Telehealth: Payer: Self-pay

## 2022-11-27 NOTE — Telephone Encounter (Signed)
Please notify this patient that my portion of his paperwork is complete. He needs to follow the instructions I have outlined on page 2 and fill out his information on page 3. He will need to mail this paperwork back, not fax. I will place this paperwork in the RN triage box. There is nothing more for me to do -[place]  I called patient with an arabic interpreter.   Advised he will need to come by and pick up paperwork to mail.   Patient voiced understanding.

## 2022-12-04 ENCOUNTER — Ambulatory Visit: Payer: Medicaid Other | Admitting: Pharmacist

## 2022-12-04 NOTE — Telephone Encounter (Signed)
Message acknowledged and reviewed.

## 2022-12-18 ENCOUNTER — Encounter (INDEPENDENT_AMBULATORY_CARE_PROVIDER_SITE_OTHER): Payer: Medicaid Other | Admitting: Pharmacist

## 2022-12-18 ENCOUNTER — Encounter: Payer: Self-pay | Admitting: Student

## 2022-12-18 ENCOUNTER — Ambulatory Visit: Payer: Medicaid Other | Admitting: Student

## 2022-12-18 VITALS — BP 106/79 | HR 99 | Wt 131.4 lb

## 2022-12-18 DIAGNOSIS — E119 Type 2 diabetes mellitus without complications: Secondary | ICD-10-CM | POA: Diagnosis not present

## 2022-12-18 DIAGNOSIS — H903 Sensorineural hearing loss, bilateral: Secondary | ICD-10-CM

## 2022-12-18 DIAGNOSIS — Z720 Tobacco use: Secondary | ICD-10-CM

## 2022-12-18 LAB — POCT GLYCOSYLATED HEMOGLOBIN (HGB A1C): HbA1c, POC (controlled diabetic range): 7 % (ref 0.0–7.0)

## 2022-12-18 NOTE — Patient Instructions (Addendum)
It was great to see you today! Thank you for choosing [facility] for your primary care. [name] was seen for diabetes follow up.  Today we addressed: Your blood sugar is very well controlled.  I will see back in 3 months, Dr. Raymondo Band will see you back in 6 weeks.  We are working on getting you established with ENT.  If you haven't already, sign up for My Chart to have easy access to your labs results, and communication with your primary care physician.  Call the clinic at [phone] if your symptoms worsen or you have any concerns.  Please arrive 15 minutes before your appointment to ensure smooth check in process.  We appreciate your efforts in making this happen.  Thank you for allowing me to participate in your care, [name], DO 12/18/2022, 4:13 PM PGY-3, [facility]

## 2022-12-18 NOTE — Assessment & Plan Note (Addendum)
well controlled - Last A1c:  Lab Results  Component Value Date   HGBA1C 7.0 12/18/2022  - Medications: Lantus 4 units QAM, metformin 1000mg  BID, Farxiga 10mg  daily CGM report time in target range was 90%.  Follow-up with Dr. Raymondo Band in 6 weeks and follow-up with myself in 3 months.

## 2022-12-18 NOTE — Research (Signed)
S:     Chief Complaint  Patient presents with   Medication Management    [name] Visit - 3 months   47 y.o. male who presents for diabetes evaluation, education, and management.  PMH is significant for DM, HLD, TIA, and tobacco use.  Patient was also seen by Primary Care Provider, Dr. Royal Piedra, today.    At last visit, patient had well controlled blood sugars based on CGM - Libre 3.    Today, patient arrives in good spirits and presents with the assistance of an online interpretor [facility]). He reports stress related to inability to find a job and due to his poor hearing.  Reports ongoing fatigue for the past month.    Patient reports Diabetes was diagnosed ~3 years ago and has been on medication the entire time. Insulin naive.     Current diabetes medications include: metformin 1000 mg BID and Lantus 4 units daily Current hyperlipidemia medications include: rosuvastatin 20 mg daily   Patient reports adherence to taking all medications as prescribed.    Insurance coverage: Medicaid   O:   Review of Systems  All other systems reviewed and are negative.   Physical Exam Constitutional:      Appearance: Normal appearance. He is normal weight.  Pulmonary:     Effort: Pulmonary effort is normal.  Neurological:     Mental Status: He is alert.      Lab Results  Component Value Date   HGBA1C 7.0 12/18/2022   % Time CGM is active: 98%  Average Glucose: 132 mg/dL Glucose Management Indicator: 6.5%  Glucose Variability: 25.2% (goal <36%) Time in Goal:  - Time in range 70-180: 91% - Time above range: 9% with 0% being > 250 - Time below range: 0% Observed patterns: Much improved glucose control    Lipid Panel     Component Value Date/Time   CHOL 137 09/21/2020 1659   TRIG 153 (H) 09/21/2020 1659   HDL 36 (L) 09/21/2020 1659   CHOLHDL 3.8 09/21/2020 1659   CHOLHDL 6.1 01/03/2018 0844   VLDL 40 01/03/2018 0844   LDLCALC 74 09/21/2020 1659    Clinical  Atherosclerotic Cardiovascular Disease (ASCVD): Hx TIA The 10-year ASCVD risk score (Arnett DK, et al., 2019) is: 5.5%   Values used to calculate the score:     Age: 4 years     Sex: Male     Is Non-Hispanic African American: No     Diabetic: Yes     Tobacco smoker: Yes     Systolic Blood Pressure: 106 mmHg     Is BP treated: No     HDL Cholesterol: 36 mg/dL     Total Cholesterol: 137 mg/dL   Patient is participating in a Managed Medicaid Plan:  Yes     A/P:  [name]:  - 8 sensors provided for a 3 month supply. Educated to contact the office if the sensor falls off early and replacements are needed before their next [employer].   Diabetes of ~ 3 years duration, currently doing very well with glucose control as evidence by his . Patient is able to verbalize appropriate hypoglycemia management plan. Medication adherence appears good. Control is suboptimal due to stress/worry. -Continued basal insulin lantus (insulin glargine) at 4 units once daily.  -Continued metformin 1000mg  BID  -Patient educated on purpose, proper use, and potential adverse effects. -Extensively discussed pathophysiology of diabetes, recommended lifestyle interventions, dietary effects on blood sugar control.  -Counseled on s/sx  of and management of hypoglycemia.  -Next A1c anticipated at 6 month - end of [name] follow-up. - Completed DDS scale today with help of interpreter.   Tobacco Use disorder - smoking cessation currently in the action stage of quit attempt.  Made progress with intake to ~ 4-5 per day with varenicline 0.5mg  daily.   -Continued varenicline (Chantix) 0.5 mg once daily with food. Discussed proper use and potential side effects associated with varenicline.  -Goal set to reduce smoking to 3-4 cigarettes per day as short-term goal.   Written patient instructions provided. Patient verbalized understanding of treatment plan.  Total time in face to face counseling 47 minutes.     Follow-up:  Pharmacist ~ 5 weeks. PCP clinic visit in 3 months.

## 2022-12-18 NOTE — Assessment & Plan Note (Signed)
Tobacco Use disorder - smoking cessation currently in the action stage of quit attempt.  Made progress with intake to ~ 4-5 per day with varenicline 0.5mg  daily.   -Continued varenicline (Chantix) 0.5 mg once daily with food. Discussed proper use and potential side effects associated with varenicline.  -Goal set to reduce smoking to 3-4 cigarettes per day as short-term goal.

## 2022-12-18 NOTE — Progress Notes (Signed)
  SUBJECTIVE:   CHIEF COMPLAINT / HPI:   Type 2 Diabetes: Home medications include: Metformin 1000 mg twice daily, Farxiga 10 g daily, Lantus 5 units every morning. Does endorse compliance. He is taking 4 units of Lantus in the morning. Home glucose monitoring is performed regularly with libre 3 CGM. Concerned because he gets low blood sugar sometimes in the 50s, but he never gets symptoms with this. He gets those values maybe twice a week.  Patient is not up to date on diabetic eye. Patient is not up to date on diabetic foot exam.   He is taking his Varenicline as well for smoking cessation. He still smokes 4-5 cigarettes per day, the medication is not working for him.   Further, he is very saddened by his difficulty hearing and is requesting help with getting hearing aids.  He notes he is gone through audiology testing 4 times but has never been followed up with by audiology for hearing aids.  Arabic video interpreter used throughout encounter. Dr. Raymondo Band also present during visit.   PERTINENT  PMH / PSH: T2DM, HLD, MDD, PTSD  Patient Care Team: [name], DO as PCP - General (Family Medicine) Raliegh Ip, DO (Family Medicine) OBJECTIVE:  BP 106/79   Pulse 99   Wt 131 lb 6.4 oz (59.6 kg)   SpO2 98%   BMI 21.45 kg/m  Physical Exam Constitutional:      Appearance: Normal appearance.  Neurological:     Mental Status: He is alert.  Psychiatric:        Mood and Affect: Mood normal.        Behavior: Behavior normal.    ASSESSMENT/PLAN:  Type 2 diabetes mellitus without complication, without long-term current use of insulin (HCC) Assessment & Plan: well controlled - Last A1c:  Lab Results  Component Value Date   HGBA1C 7.0 12/18/2022  - Medications: Lantus 4 units QAM, metformin 1000mg  BID, Farxiga 10mg  daily CGM report time in target range was 90%.  Follow-up with Dr. Raymondo Band in 6 weeks and follow-up with myself in 3 months.   Bilateral sensorineural hearing  loss Assessment & Plan: Of note, he has required hearing aids in the past.  He has been seen by audiology and ENT as well.  Last notation I am able to see is by ENT on 12/25/2021 in which she was noted they would follow-up with audiology regarding his hearing loss.  I suspect this did not happen likely related to difficulty getting in touch with him given he does have hearing loss and further only speaks Arabic.  My office will get him an appointment with ENT for this follow-up.  He was greatly appreciative of this.   [name], DO 12/18/2022, 5:10 PM PGY-3, [facility]

## 2022-12-18 NOTE — Assessment & Plan Note (Signed)
[name]:  - 8 sensors provided for a 3 month supply. Educated to contact the office if the sensor falls off early and replacements are needed before their next [employer].   Diabetes of ~ 3 years duration, currently doing very well with glucose control as evidence by his . Patient is able to verbalize appropriate hypoglycemia management plan. Medication adherence appears good. Control is suboptimal due to stress/worry. -Continued basal insulin lantus (insulin glargine) at 4 units once daily.  -Continued metformin 1000mg  BID  -Patient educated on purpose, proper use, and potential adverse effects. -Extensively discussed pathophysiology of diabetes, recommended lifestyle interventions, dietary effects on blood sugar control.  -Counseled on s/sx of and management of hypoglycemia.  -Next A1c anticipated at 6 month - end of [name] follow-up. - Completed DDS scale today with help of interpreter.

## 2022-12-18 NOTE — Assessment & Plan Note (Signed)
Of note, he has required hearing aids in the past.  He has been seen by audiology and ENT as well.  Last notation I am able to see is by ENT on 12/25/2021 in which she was noted they would follow-up with audiology regarding his hearing loss.  I suspect this did not happen likely related to difficulty getting in touch with him given he does have hearing loss and further only speaks Arabic.  My office will get him an appointment with ENT for this follow-up.  He was greatly appreciative of this.

## 2022-12-18 NOTE — Patient Instructions (Addendum)
??? ????? ?? ???? ?????!  ???? ?? ?? ???? ????? ????? ?? ????   80-130 ??? ????? ???? ?? 180 ??? ?????.  ??????? ??????: ????? ?? ????? ?????? (??????? ???????) ????? 4 ????? ??? ????? ??????  ????? ?? ????? ??????????? 1000 ??? ????? ??????.   ????? ?? ????? ??????????? 0.5 ??? ??? ????? ?????? ?? ??????.  ????? ??? ??? ????? ??????? ??? 3 ?? ??? ?????? ???? ????? ??????.   ????? ?? ????? ????? ?? ?????? ??????? ??????? ???????. ???? ??? ????? ???? ????? ???? ????????? ???????? ??????? ??????? ?? ?????? (?????? ?????? ?????? ????????). ???? ???? ?? ????? ????? ?? ???? ????? ???????? ??????? ?? ??????? (????? ?? ???????)? ???? ???????? ??????? ??? ?????? ??? ???? ?? ??? ????? ??? ???????. kan jamilan 'an 'arak alyawma! hadafuk hu 'an [place] alsukar fi aldam 80-130 qabl al'akl wa'aqala min 180 baed al'akli. taghyirat aldawa'i: astamara fi tanawul alntus ('ansulin jlarjin) bimueadal 4 wahadat maratan wahidatan ywmyan astamira fi tanawul almitfurmin 1000 milaghi maratayn ywmyan. astamira fi tanawul alfarinklin 0.5 milagha maratan wahidatan ywmyan mae altaeami. aleamal ealaa hadaf taqlil altadkhin 'iilaa 3 'aw 'aqalu ywmyan khilal alshahr altaali. aistamara fi aleamal aljayid mae alnizam alghidhayiyi wamumarasat alriyadati. aihdif 'iilaa aitibae nizam ghidhayiyin [place]' bialkhadrawat walfawakih walluhum alkhaliat min alduhun (aldajaj waldik alruwmii wal'asmak). hawal alhada min tanawul almilih ean tariq tanawul alkhadrawat altaazajat 'aw almujamada (bdlaan min almuealabati), washatf alkhadrawat almuealabat qabl altahi, wala tudif 'aya milh 'iidafiin 'iilaa alwajabati.   It was nice to see you today!  Your goal blood sugar is 80-130 before eating and less than 180 after eating.  Medication Changes: Continue lantus (insulin glargine) at 4 units once daily  Continue Metformin 1000mg  twice daily.   Continue varenicline 0.5mg  once daily with food.  Work on goal of reducing smoking further  to 3 or less per day over the next month.   Keep up the good work with diet and exercise. Aim for a diet full of vegetables, fruit and lean meats (chicken, [place], fish). Try to limit salt intake by eating fresh or frozen vegetables (instead of canned), rinse canned vegetables prior to cooking and do not add any additional salt to meals.

## 2022-12-19 ENCOUNTER — Telehealth: Payer: Self-pay

## 2022-12-19 NOTE — Telephone Encounter (Signed)
Called pt using [employer] interpre[employer] [place], [id number]) to inform pt of an appt I called and scheduled for him. Info below.  [facility] [address] [place] [phone] Arrive not later than 9:10 for hearing test Will see Dr at 10:15.  Sunday Spillers, CMA

## 2022-12-19 NOTE — Progress Notes (Signed)
Reviewed and agree with Dr Koval's plan.   

## 2022-12-31 ENCOUNTER — Telehealth: Payer: Self-pay | Admitting: Student

## 2022-12-31 NOTE — Telephone Encounter (Signed)
Patient dropped off DMV paperwork to be completed. Last DOS was 12/18/22. Placed in [employer].

## 2023-01-01 NOTE — Telephone Encounter (Signed)
Reviewed form and placed in PCP's box for completion.  .Michelle R Simpson, CMA  

## 2023-01-07 NOTE — Telephone Encounter (Signed)
I have called pt and left voicemail using [employer] [place], [phone]). Told pt the form can not be filled out until he sees the eye Dr on 07/15/2023 at 2:30pm.  Sunday Spillers, CMA

## 2023-01-07 NOTE — Telephone Encounter (Signed)
Pt has an appt on 07/15/2023. That is the first available. Sunday Spillers, CMA

## 2023-01-14 ENCOUNTER — Telehealth: Payer: Self-pay | Admitting: Student

## 2023-01-14 NOTE — Telephone Encounter (Signed)
Patient dropped off a copy of his vision report for his paperwork that he submitted. Asks if Dr, Royal Piedra can please give hi a call when he gets a chance.

## 2023-01-15 ENCOUNTER — Encounter: Payer: Self-pay | Admitting: Student

## 2023-01-15 NOTE — Telephone Encounter (Signed)
Telephone encounter: Called patient utilizing arabic interpreter. Discussed that he will need to come in to sign records release for optometry paperwork he has shared with me. He was amenable to coming in for this and pickup his DMV paperwork.   In-person encounter: I have created letter verifying I have a copy of his vision check by [facility] Dr. Aletta Edouard, OD. 20/20 vision in both eyes. I attempted to reach office however they were closed. Aaron Reyes brought a friend to interpret for him and he was able to sign records release for optometry office. I went over [facility] paperwork with him and handed him this paperwork in addition to my letter. They plan to go to [facility] as he was only given 1 month to have this paperwork completed. Records release form placed in folder in office to be completed. I will have his eye exam scanned into his chart as well.   Discussed with Aaron Reyes that he has an appointment with our pharmacist Dr. Raymondo Band next week on 01/22/23 at 3:30pm. Should he need anything else, he is welcome to notify Dr. Raymondo Band who will share information with me. He was very appreciative of assistance.   Of note, this eye exam was NOT a diabetic eye exam to my knowledge. However, it was documented that there are no concerns for vision affecting ability to operate a motor vehicle. Patient will still need diabetic eye exam and I will attempt to get him established with Ophthalmology at a later visit.

## 2023-01-22 ENCOUNTER — Ambulatory Visit: Payer: Medicaid Other | Admitting: Pharmacist

## 2023-01-30 ENCOUNTER — Ambulatory Visit (INDEPENDENT_AMBULATORY_CARE_PROVIDER_SITE_OTHER): Payer: Medicaid Other | Admitting: Pharmacist

## 2023-01-30 VITALS — BP 128/76 | HR 86 | Ht 66.0 in | Wt 134.2 lb

## 2023-01-30 DIAGNOSIS — R110 Nausea: Secondary | ICD-10-CM | POA: Diagnosis not present

## 2023-01-30 DIAGNOSIS — E119 Type 2 diabetes mellitus without complications: Secondary | ICD-10-CM

## 2023-01-30 DIAGNOSIS — F339 Major depressive disorder, recurrent, unspecified: Secondary | ICD-10-CM

## 2023-01-30 DIAGNOSIS — K219 Gastro-esophageal reflux disease without esophagitis: Secondary | ICD-10-CM | POA: Diagnosis not present

## 2023-01-30 DIAGNOSIS — Z720 Tobacco use: Secondary | ICD-10-CM | POA: Diagnosis not present

## 2023-01-30 MED ORDER — ONDANSETRON HCL 4 MG PO TABS
4.0000 mg | ORAL_TABLET | Freq: Three times a day (TID) | ORAL | 0 refills | Status: DC | PRN
Start: 2023-01-30 — End: 2023-07-30

## 2023-01-30 MED ORDER — PANTOPRAZOLE SODIUM 40 MG PO TBEC
40.0000 mg | DELAYED_RELEASE_TABLET | Freq: Two times a day (BID) | ORAL | 0 refills | Status: DC
Start: 2023-01-30 — End: 2023-02-04

## 2023-01-30 MED ORDER — VARENICLINE TARTRATE 0.5 MG PO TABS
0.5000 mg | ORAL_TABLET | Freq: Every day | ORAL | 3 refills | Status: AC
Start: 2023-01-30 — End: ?

## 2023-01-30 MED ORDER — TRAZODONE HCL 50 MG PO TABS
50.0000 mg | ORAL_TABLET | Freq: Every day | ORAL | 1 refills | Status: AC
Start: 2023-01-30 — End: ?

## 2023-01-30 MED ORDER — FLUTICASONE PROPIONATE 50 MCG/ACT NA SUSP: 2.0000 | Freq: Every day | NASAL | 5 refills | Status: DC

## 2023-01-30 NOTE — Progress Notes (Signed)
S:     Chief Complaint  Patient presents with   Medication Management   47 y.o. male who presents for diabetes evaluation, education, and management. Patient arrives in good spirits and presents without any assistance. Patient speaks Arabic and was connected to translators Heb[employer] [phone] and Amal [phone].  Patient was referred and last seen by Primary Care Provider, Dr. Royal Piedra on 12/18/22.  PMH is significant for DM, HLD, TIA, and tobacco use.  At last visit, discussed results from [place] with patient and provided patient with 2 Libre 3 sensors from [place] supply.  Patient reports Diabetes was diagnosed in ~3 years ago and has been on medication the entire time.   Current diabetes medications include: Glucophage (metformin) 1000 mg BID, Lantus (insulin glargine) 4 units once daily, Farxiga (dapagliflozin) 10 mg once daily  Current hyperlipidemia medications include: rosuvastatin 20 mg daily   Patient reports adherence to taking all medications as prescribed.   Patient currently takes Seroquel (quetiapine) 25 mg twice a week and reports trouble sleeping at night.  Insurance coverage: Medicaid  O:   Review of Systems  All other systems reviewed and are negative.   Physical Exam Constitutional:      Appearance: Normal appearance. He is normal weight.  Neurological:     Mental Status: He is alert.  Psychiatric:        Mood and Affect: Mood normal.        Behavior: Behavior normal.        Thought Content: Thought content normal.        Judgment: Judgment normal.    14 day average blood glucose: 145 mg/dL  Libre3 CGM Download today on 01/30/23 % Time CGM is active: 93% Average Glucose: 145 mg/dL Glucose Management Indicator: 6.8%  Glucose Variability: 22.2% (goal <36%) Time in Goal:  - Time in range 70-180: 88% - Time above range: 12% - Time below range: 0% Observed patterns:   Lab Results  Component Value Date   HGBA1C 7.0 12/18/2022   Vitals:    01/30/23 1114  BP: 128/76  Pulse: 86  SpO2: 93%    Lipid Panel     Component Value Date/Time   CHOL 137 09/21/2020 1659   TRIG 153 (H) 09/21/2020 1659   HDL 36 (L) 09/21/2020 1659   CHOLHDL 3.8 09/21/2020 1659   CHOLHDL 6.1 01/03/2018 0844   VLDL 40 01/03/2018 0844   LDLCALC 74 09/21/2020 1659    Clinical Atherosclerotic Cardiovascular Disease (ASCVD): Hx TIA The 10-year ASCVD risk score (Arnett DK, et al., 2019) is: 8.2%   Values used to calculate the score:     Age: 76 years     Sex: Male     Is Non-Hispanic African American: No     Diabetic: Yes     Tobacco smoker: Yes     Systolic Blood Pressure: 128 mmHg     Is BP treated: No     HDL Cholesterol: 36 mg/dL     Total Cholesterol: 137 mg/dL   Patient is participating in a Managed Medicaid Plan:  Yes   A/P:  [name]:  - Educated to contact the office if the sensor falls off early and replacements are needed before their next [employer].  Diabetes ~ 3 years duration, currently doing very well with glucose control as evidenced by his GMI of 6.8%. Patient is able to verbalize appropriate hypoglycemia management plan. Medication adherence appears good. Assisted patient with downloading Libre3 app on new phone and getting  connected to Libre3 sensor.  - Continued Lantus (insulin glargine) 4 units once daily - Continued Glucophage (metformin) 1000 mg BID - Continued Farxiga (dapagliflozin) 10 mg daily -Patient educated on purpose, proper use, and potential adverse effects. -Extensively discussed pathophysiology of diabetes, recommended lifestyle interventions, dietary effects on blood sugar control.  -Counseled on s/sx of and management of hypoglycemia.   Tobacco Use disorder - smoking cessation currently in the action stage of quit attempt. Made progress with intake of 2-3 per day with Chantix (varenicline) 0.5 mg daily.  - Refilled Chantix (varenicline) 0.5 mg once daily with food. - Goal set to reduce  cigarettes per day as short-term goal and quit completely within the upcoming 1-2 months.  ASCVD risk - secondary prevention in patient with diabetes. Last LDL from 2019 was 113 not at goal of <70 mg/dL.  - Continued Crestor (rosuvastatin) 20 mg.  - Continued aspirin 81 mg once daily.  - Consulted with Dr. McDiarmid on patient's trouble sleeping. Started patient on Desyrel (trazodone) 50 mg once daily. Follow up with Dr. Royal Piedra on 03/04/23.  - Refilled Flonase (fluticasone propionate), Zofran (ondansetron), and Protonix (pantoprazole) as per patient's request.  Written patient instructions provided. Patient verbalized understanding of treatment plan.  Total time in face to face counseling 32 minutes.    Follow-up:  Pharmacist PRN PCP clinic visit on 03/04/23 with Dr. Royal Piedra. Patient seen with Rickey Primus, PharmD Candidate and Andee Poles, PharmD Candidate.

## 2023-01-30 NOTE — Assessment & Plan Note (Signed)
[name]:  - Educated to contact the office if the sensor falls off early and replacements are needed before their next [employer].  Diabetes ~ 3 years duration, currently doing very well with glucose control as evidenced by his GMI of 6.8%. Patient is able to verbalize appropriate hypoglycemia management plan. Medication adherence appears good. Assisted patient with downloading Libre3 app on new phone and getting connected to Libre3 sensor.  - Continued Lantus (insulin glargine) 4 units once daily - Continued Glucophage (metformin) 1000 mg BID - Continued Farxiga (dapagliflozin) 10 mg daily -Patient educated on purpose, proper use, and potential adverse effects. -Extensively discussed pathophysiology of diabetes, recommended lifestyle interventions, dietary effects on blood sugar control.  -Counseled on s/sx of and management of hypoglycemia.

## 2023-01-30 NOTE — Patient Instructions (Addendum)
??? ????? ?? ???? ?????! ??? ??? ??????? ???????? ?? ??????? ???????! ????? ?? ????? ??? ????? ????????.    ??????? ??????: ????? ?? ????? ???? ??????? ?????? ???? ???????.   ?? ????? ??? ????? ???? ??? ?????? ?????? ????? ?? ????????? ?????????.  ???? ??????   16/09/24 1:30 p.m. ?? ??????? ??????.  ????? ?? ????? ????? ?? ?????? ??????? ??????? ???????. ???? ??? ????? ???? ????? ???? ????????? ???????? ??????? ??????? ?? ?????? (?????? ?????? ?????? ????????). ???? ???? ?? ????? ????? ?? ???? ????? ?????? ??????? ?? ??????? (????? ?? ???????)? ???? ?????? ??????? ??? ????? ??? ???? ?? ??? ????? ??? ???????. kan jamilan 'an 'arak alyawma! eamal jayid liltanaqus altadrijii fi aistikhdam alsajayir! aistamara fi aleamal ealaa tanawul aljulukuz. taghyirat aldawa'i: astamira fi tanawul jamie al'adwiat al'ukhraa binafs altariqati. tama 'iirsal khams wasifat tibiyat 'iilaa saydaliat lundil duktur fi jirinsburu liaistilamiha. tabie bitarikh 16/09/24 mae alduktur dihbura. aistamara fi aleamal aljayid mae alnizam alghidhayiyi wamumarasat alriyadati. aihdif 'iilaa aitibae nizam ghidhayiyin [place]' bialkhadrawat walfawakih walluhum alkhaliat min alduhun (aldajaj waldik alruwmii wal'asmak). hawal alhada min tanawul almilih ean tariq tanawul alkhudar altaazajat 'aw almujamada (bdlaan min almuealabati), washatf alkhudar almuealabat qabl altahi wala tudif 'aya milh 'iidafiin 'iilaa alwajabati.     It was nice to see you today! Good work tapering the cigarette use! Continue to work on glucose intake.    Medication Changes: Continue all other medication the same.   Five prescriptions were sent to the pharmacy on [address] in [place] for pick up.  Follow up with 03/04/23 at 1:30 p.m. with Dr. Royal Piedra.  Keep up the good work with diet and exercise. Aim for a diet full of vegetables, fruit and lean meats (chicken, [place], fish). Try to limit salt intake by eating fresh or frozen vegetables (instead of  canned), rinse canned vegetables prior to cooking and do not add any additional salt to meals.

## 2023-01-30 NOTE — Assessment & Plan Note (Signed)
Tobacco Use disorder - smoking cessation currently in the action stage of quit attempt. Made progress with intake of 2-3 per day with Chantix (varenicline) 0.5 mg daily.  - Refilled Chantix (varenicline) 0.5 mg once daily with food. - Goal set to reduce cigarettes per day as short-term goal and quit completely within the upcoming 1-2 months.

## 2023-01-31 NOTE — Progress Notes (Signed)
Reviewed and agree with Dr Koval's plan.   

## 2023-02-02 ENCOUNTER — Other Ambulatory Visit: Payer: Self-pay | Admitting: Family Medicine

## 2023-02-02 DIAGNOSIS — K219 Gastro-esophageal reflux disease without esophagitis: Secondary | ICD-10-CM

## 2023-02-26 ENCOUNTER — Ambulatory Visit: Payer: Medicaid Other | Admitting: Family Medicine

## 2023-03-04 ENCOUNTER — Ambulatory Visit: Payer: Medicaid Other | Admitting: Student

## 2023-03-04 NOTE — Progress Notes (Deleted)
  SUBJECTIVE:   CHIEF COMPLAINT / HPI:   Type 2 Diabetes: Home medications include: Metformin 1000 mg twice daily, Lantus 40 units daily, Farxiga 10 mg daily. {Blank single:19197::"Does","Does not"} endorse compliance. Home glucose monitoring {home testing:315145}.   Patient is not up to date on diabetic eye. Patient is not up to date on diabetic foot exam.   PERTINENT  PMH / PSH: T2DM, HLD, MDD, PTSD  OBJECTIVE:  There were no vitals taken for this visit. Physical Exam   ASSESSMENT/PLAN:  There are no diagnoses linked to this encounter. No follow-ups on file. [name], DO 03/04/2023, 12:31 PM PGY-3,  Family Medicine {    This will disappear when note is signed, click to select method of visit    :1}

## 2023-03-18 ENCOUNTER — Other Ambulatory Visit: Payer: Self-pay

## 2023-03-18 ENCOUNTER — Encounter (HOSPITAL_COMMUNITY): Payer: Self-pay | Admitting: Emergency Medicine

## 2023-03-18 ENCOUNTER — Ambulatory Visit (HOSPITAL_COMMUNITY)
Admission: EM | Admit: 2023-03-18 | Discharge: 2023-03-18 | Disposition: A | Discharge status: Home / Self Care | Payer: Medicaid Other | Attending: Physician Assistant | Admitting: Physician Assistant

## 2023-03-18 DIAGNOSIS — M792 Neuralgia and neuritis, unspecified: Secondary | ICD-10-CM

## 2023-03-18 DIAGNOSIS — Z760 Encounter for issue of repeat prescription: Secondary | ICD-10-CM

## 2023-03-18 DIAGNOSIS — B029 Zoster without complications: Secondary | ICD-10-CM

## 2023-03-18 MED ORDER — DICYCLOMINE HCL 10 MG PO CAPS: 10.0000 mg | ORAL_CAPSULE | Freq: Three times a day (TID) | ORAL | 0 refills | Status: DC

## 2023-03-18 MED ORDER — LIDOCAINE 5 % EX PTCH: 1.0000 | MEDICATED_PATCH | CUTANEOUS | 0 refills | Status: DC

## 2023-03-18 MED ORDER — OMEPRAZOLE 20 MG PO CPDR: 20.0000 mg | DELAYED_RELEASE_CAPSULE | Freq: Every day | ORAL | 0 refills | Status: DC

## 2023-03-18 MED ORDER — GABAPENTIN 300 MG PO CAPS: 300.0000 mg | ORAL_CAPSULE | Freq: Three times a day (TID) | ORAL | 0 refills | Status: DC

## 2023-03-18 MED ORDER — VALACYCLOVIR HCL 1 G PO TABS
1000.0000 mg | ORAL_TABLET | Freq: Three times a day (TID) | ORAL | 0 refills | Status: AC
Start: 2023-03-18 — End: 2023-03-25

## 2023-03-18 NOTE — Discharge Instructions (Addendum)
You have shingles.  Start valacyclovir 3 times daily for 7 days.  Take gabapentin 300 mg 3 times daily.  This will make you sleepy so do not drive or drink alcohol with taking it.  Apply lidocaine patch to that area that is particularly painful.  Follow-up with your primary care.  If anything worsens or changes please return for reevaluation.  I have refilled your omeprazole and dicyclomine as you requested.  ???? ??????? ????????.  ???? ???????? ???????????? 3 ???? ?????? ???? 7 ????.  ????? ????????? 300 ??? 3 ???? ??????.  ??? ?????? ???? ???????? ??? ?? ???? ?? ???? ?????? ????? ??????.  ?? ???? ??????????? ??? ??? ??????? ??????? ???? ???.  ???????? ?? ??????? ??????? ?????? ??.  ??? ????? ?? ??? ?? ????? ????? ?????? ?????? ???????.  ??? ??? ?????? ??? ???????????? ????????????? ??? ????. ladayk alqawba' almantiqiatu. abda biastikhdam falasiklufir 3 maraat ywmyan limudat 7 'ayami. tanawal jababnatayn 300 majam 3 maraat ywmyan. hadha sayajealuk tasheur bialnueasi, lidha la taqud 'aw tashrab alkuhul 'athna' tanawulihi. dae ruqeat alliydwkayyn ealaa tilk almintaqat almulimat bishakl khasa. almutabaeat mae alrieayat al'awaliat alkhasat bika. 'iidha tafaqam 'ayi shay' 'aw taghayrin, fayurjaa aleawdat li'iieadat altaqyimi. laqad qumt bi'iieadat mal' al'uwmibrazul waldisiklumin kama talabt.

## 2023-03-18 NOTE — ED Triage Notes (Signed)
Pain to left side of torso.  Rash to left mid back.  Pain started 2 days ago, not sure where rash came from.  Has had episodes of itching and then painful

## 2023-03-18 NOTE — ED Provider Notes (Signed)
[facility]    CSN: [id number] Arrival date & time: 03/18/23  1914      History   Chief Complaint No chief complaint on file.   HPI Aaron Reyes is a 47 y.o. male.   Patient presents today with a 2-day history of very painful rash on his back extending to his flank and towards his anterior chest.  He speaks Arabic and via interpreter was utilized during visit.  He reports that the pain is rated 10 on a 0-10 pain scale, described as burning/stabbing, no alleviating factors notified.  He has tried over-the-counter medications without improvement of symptoms.  He reports associated fatigue and nausea.  Denies any vomiting, chest pain, shortness of breath, fever, weakness.  Denies episodes of similar symptoms in the past.  He does not have a history of dermatological condition.    Past Medical History:  Diagnosis Date   Bilateral sensorineural hearing loss 02/07/2017   Cervicalgia 10/19/2016   Closed fracture of body of sternum 06/09/2018   Diabetes mellitus without complication (HCC)    per pt report   Food insecurity 01/02/2019   GERD (gastroesophageal reflux disease)    H. 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   Testicular mass 03/18/2015    Patient Active Problem List   Diagnosis Date Noted   TIA (transient ischemic attack) 06/18/2022   Healthcare maintenance 03/29/2022   Attention and concentration deficit 11/21/2021   Hearing aid worn 08/29/2021   Weakness 11/15/2020   Hyperlipidemia 09/22/2020   Major depressive disorder,  recurrent episode with anxious distress (HCC) 03/16/2020   Diabetes mellitus (HCC) 07/13/2019   Language barrier 01/02/2019   Tobacco abuse 01/01/2018   Bilateral sensorineural hearing loss 02/07/2017   Gastroesophageal reflux disease 09/04/2016   Post traumatic stress disorder 01/26/2016    Past Surgical History:  Procedure Laterality Date   CARDIAC CATHETERIZATION     CARDIAC CATHETERIZATION     x 2   CARDIAC CATHETERIZATION N/A 04/11/2015   Procedure: Left Heart Cath and Coronary Angiography;  Surgeon: Lyn Records, MD;  Location: [facility] INVASIVE CV LAB;  Service: Cardiovascular;  Laterality: N/A;   HERNIA REPAIR     UPPER GASTROINTESTINAL ENDOSCOPY  11/07/2021       Home Medications    Prior to Admission medications   Medication Sig Start Date End Date Taking? Authorizing Provider  dicyclomine (BENTYL) 10 MG capsule Take 1 capsule (10 mg total) by mouth 4 (four) times daily -  before meals and at bedtime. 03/18/23  Yes Marcianne Ozbun K, PA-C  lidocaine (LIDODERM) 5 % Place 1 patch onto the skin daily. Remove & Discard patch within 12 hours or as directed by MD 03/18/23  Yes Daunte Oestreich, Noberto Retort, PA-C  omeprazole (PRILOSEC) 20 MG capsule Take 1 capsule (20 mg total) by mouth daily. 03/18/23  Yes Ashawn Rinehart, Noberto Retort, PA-C  valACYclovir (VALTREX) 1000 MG tablet Take 1 tablet (1,000 mg total) by mouth 3 (three) times daily for 7 days. 03/18/23 03/25/23 Yes Kendryck Lacroix, Noberto Retort,  PA-C  aspirin 81 MG chewable tablet Chew 1 tablet (81 mg total) by mouth daily. 11/13/22   McDiarmid, Leighton Roach, MD  Blood Glucose Monitoring Suppl KIT 1 kit by Does not apply route in the morning and at bedtime. Patient not taking: Reported on 01/30/2023 09/24/22   [name], DO  dapagliflozin propanediol (FARXIGA) 10 MG TABS tablet Take 1 tablet (10 mg total) by mouth daily. 11/13/22   McDiarmid, Leighton Roach, MD  fluticasone (FLONASE) 50 MCG/ACT nasal spray Place 2 sprays into both nostrils daily. 01/30/23   McDiarmid, Leighton Roach, MD   gabapentin (NEURONTIN) 300 MG capsule Take 1 capsule (300 mg total) by mouth 3 (three) times daily. 03/18/23   Kielan Dreisbach, Denny Peon K, PA-C  insulin glargine (LANTUS) 100 UNIT/ML Solostar Pen Inject 5 Units into the skin in the morning. Patient taking differently: Inject 4 Units into the skin in the morning. 11/13/22   McDiarmid, Leighton Roach, MD  Insulin Pen Needle (BD PEN NEEDLE NANO 2ND GEN) 32G X 4 MM MISC Use to administer insulin once daily. 10/04/22   McDiarmid, Leighton Roach, MD  metFORMIN (GLUCOPHAGE) 1000 MG tablet Take 1 tablet (1,000 mg total) by mouth 2 (two) times daily with a meal. 11/13/22   McDiarmid, Leighton Roach, MD  ondansetron (ZOFRAN) 4 MG tablet Take 1 tablet (4 mg total) by mouth every 8 (eight) hours as needed for nausea or vomiting. 01/30/23   McDiarmid, Leighton Roach, MD  QUEtiapine (SEROQUEL) 25 MG tablet TAKE 1 TABLET(25 MG) BY MOUTH AT BEDTIME 02/21/22   Erick Alley, DO  rosuvastatin (CRESTOR) 20 MG tablet Take 1 tablet (20 mg total) by mouth daily. 09/27/22 09/22/23  McDiarmid, Leighton Roach, MD  traZODone (DESYREL) 50 MG tablet Take 1 tablet (50 mg total) by mouth at bedtime. 01/30/23   McDiarmid, Leighton Roach, MD  varenicline (CHANTIX) 0.5 MG tablet Take 1 tablet (0.5 mg total) by mouth daily with supper. 01/30/23   McDiarmid, Leighton Roach, MD    Family History Family History  Problem Relation Age of Onset   Diabetes Mother    Hypertension Mother    Cancer Father    CAD Father    Heart attack Father    Hypertension Father    Stroke Father    Colon cancer Neg Hx    Esophageal cancer Neg Hx    Rectal cancer Neg Hx    Stomach cancer Neg Hx     Social History Social History   Tobacco Use   Smoking status: Every Day    Current packs/day: 0.50    Types: Cigarettes   Smokeless tobacco: Never   Tobacco comments:    This morning at 2 AM smoked last (11/07/21)  Vaping Use   Vaping status: Never Used  Substance Use Topics   Alcohol use: Never   Drug use: Never     Allergies   Pork-derived products   Review  of Systems Review of Systems  Constitutional:  Positive for activity change. Negative for appetite change, fatigue and fever.  Respiratory:  Negative for shortness of breath.   Cardiovascular:  Negative for chest pain.  Gastrointestinal:  Positive for nausea. Negative for abdominal pain, diarrhea and vomiting.  Musculoskeletal:  Negative for arthralgias and myalgias.  Skin:  Positive for rash. Negative for color change and wound.     Physical Exam Triage Vital Signs ED Triage Vitals  Encounter Vitals Group     BP 03/18/23 1950 111/73     Systolic BP Percentile --  Diastolic BP Percentile --      Pulse Rate 03/18/23 1950 82     Resp 03/18/23 1950 18     Temp 03/18/23 1950 (!) 97.5 F (36.4 C)     Temp Source 03/18/23 1950 Oral     SpO2 03/18/23 1950 97 %     Weight --      Height --      Head Circumference --      Peak Flow --      Pain Score 03/18/23 1947 10     Pain Loc --      Pain Education --      Exclude from Growth Chart --    No data found.  Updated Vital Signs BP 111/73 (BP Location: Left Arm)   Pulse 82   Temp (!) 97.5 F (36.4 C) (Oral)   Resp 18   SpO2 97%   Visual Acuity Right Eye Distance:   Left Eye Distance:   Bilateral Distance:    Right Eye Near:   Left Eye Near:    Bilateral Near:     Physical Exam Vitals reviewed.  Constitutional:      General: He is awake.     Appearance: Normal appearance. He is well-developed. He is not ill-appearing.     Comments: Very pleasant male appears stated age in no acute distress sitting comfortably in exam room  HENT:     Head: Normocephalic and atraumatic.     Mouth/Throat:     Pharynx: No oropharyngeal exudate, posterior oropharyngeal erythema or uvula swelling.  Cardiovascular:     Rate and Rhythm: Normal rate and regular rhythm.     Heart sounds: Normal heart sounds, S1 normal and S2 normal. No murmur heard. Pulmonary:     Effort: Pulmonary effort is normal.     Breath sounds: Normal breath  sounds. No stridor. No wheezing, rhonchi or rales.     Comments: Clear to auscultation bilaterally Skin:    Findings: Rash present. Rash is vesicular.          Comments: Vesicular rash in T9 distribution.  Neurological:     Mental Status: He is alert.  Psychiatric:        Behavior: Behavior is cooperative.      UC Treatments / Results  Labs (all labs ordered are listed, but only abnormal results are displayed) Labs Reviewed - No data to display  EKG   Radiology No results found.  Procedures Procedures (including critical care time)  Medications Ordered in UC Medications - No data to display  Initial Impression / Assessment and Plan / UC Course  I have reviewed the triage vital signs and the nursing notes.  Pertinent labs & imaging results that were available during my care of the patient were reviewed by me and considered in my medical decision making (see chart for details).     Patient is well-appearing, afebrile, nontoxic, nontachycardic.  Discussed that symptoms are related to shingles.  Patient had all of questions about this and so we spent a significant amount of time using the interpreter to explain that this is a virus that has been reactivated and he likely initially had an infection when he was much younger.  He is within 48 hours of onset so we will start valacyclovir 3 times daily.  He reports significant pain so we will try gabapentin 300 mg 3 times daily.  We discussed this can be sedating and he is not to drive or drink alcohol while taking  it.  He can use over-the-counter medications for additional symptom relief.  He was also given lidocaine patches for pain.  Discussed that if anything worsens or changes he is to be seen immediately.  Strict return precautions given.  As I was walking out patient requested a refill of his omeprazole and dicyclomine used for chronic GI symptoms.  These refills were sent per his request.  Final Clinical Impressions(s) / UC  Diagnoses   Final diagnoses:  Herpes zoster without complication  Neuropathic pain  Medication refill     Discharge Instructions      You have shingles.  Start valacyclovir 3 times daily for 7 days.  Take gabapentin 300 mg 3 times daily.  This will make you sleepy so do not drive or drink alcohol with taking it.  Apply lidocaine patch to that area that is particularly painful.  Follow-up with your primary care.  If anything worsens or changes please return for reevaluation.  I have refilled your omeprazole and dicyclomine as you requested.  ???? ??????? ????????.  ???? ???????? ???????????? 3 ???? ?????? ???? 7 ????.  ????? ????????? 300 ??? 3 ???? ??????.  ??? ?????? ???? ???????? ??? ?? ???? ?? ???? ?????? ????? ??????.  ?? ???? ??????????? ??? ??? ??????? ??????? ???? ???.  ???????? ?? ??????? ??????? ?????? ??.  ??? ????? ?? ??? ?? ????? ????? ?????? ?????? ???????.  ??? ??? ?????? ??? ???????????? ????????????? ??? ????. ladayk alqawba' almantiqiatu. abda biastikhdam falasiklufir 3 maraat ywmyan limudat 7 'ayami. tanawal jababnatayn 300 majam 3 maraat ywmyan. hadha sayajealuk tasheur bialnueasi, lidha la taqud 'aw tashrab alkuhul 'athna' tanawulihi. dae ruqeat alliydwkayyn ealaa tilk almintaqat almulimat bishakl khasa. almutabaeat mae alrieayat al'awaliat alkhasat bika. 'iidha tafaqam 'ayi shay' 'aw taghayrin, fayurjaa aleawdat li'iieadat altaqyimi. laqad qumt bi'iieadat mal' al'uwmibrazul waldisiklumin kama talabt.    ED Prescriptions     Medication Sig Dispense Auth. Provider   gabapentin (NEURONTIN) 300 MG capsule Take 1 capsule (300 mg total) by mouth 3 (three) times daily. 60 capsule Geraldine Tesar K, PA-C   valACYclovir (VALTREX) 1000 MG tablet Take 1 tablet (1,000 mg total) by mouth 3 (three) times daily for 7 days. 21 tablet Carra Brindley K, PA-C   lidocaine (LIDODERM) 5 % Place 1 patch onto the skin daily. Remove & Discard patch within 12 hours or as directed by MD 10 patch Angello Chien,  Walton Digilio K, PA-C   omeprazole (PRILOSEC) 20 MG capsule Take 1 capsule (20 mg total) by mouth daily. 30 capsule Dalayah Deahl K, PA-C   dicyclomine (BENTYL) 10 MG capsule Take 1 capsule (10 mg total) by mouth 4 (four) times daily -  before meals and at bedtime. 40 capsule Leandrew Keech K, PA-C      PDMP not reviewed this encounter.   Jeani Hawking, PA-C 03/18/23 2059

## 2023-03-25 ENCOUNTER — Other Ambulatory Visit: Payer: Self-pay

## 2023-03-25 ENCOUNTER — Encounter: Payer: Self-pay | Admitting: Student

## 2023-03-25 ENCOUNTER — Ambulatory Visit (INDEPENDENT_AMBULATORY_CARE_PROVIDER_SITE_OTHER): Payer: Medicaid Other | Admitting: Student

## 2023-03-25 VITALS — BP 133/84 | HR 82 | Wt 135.4 lb

## 2023-03-25 DIAGNOSIS — E119 Type 2 diabetes mellitus without complications: Secondary | ICD-10-CM

## 2023-03-25 DIAGNOSIS — Z7984 Long term (current) use of oral hypoglycemic drugs: Secondary | ICD-10-CM

## 2023-03-25 DIAGNOSIS — B029 Zoster without complications: Secondary | ICD-10-CM | POA: Diagnosis present

## 2023-03-25 LAB — POCT GLYCOSYLATED HEMOGLOBIN (HGB A1C): HbA1c, POC (controlled diabetic range): 7 % (ref 0.0–7.0)

## 2023-03-25 MED ORDER — DAPAGLIFLOZIN PROPANEDIOL 10 MG PO TABS
10.0000 mg | ORAL_TABLET | Freq: Every day | ORAL | 3 refills | Status: DC
Start: 2023-03-25 — End: 2023-03-28

## 2023-03-25 MED ORDER — LIDOCAINE 5 % EX PTCH: 1.0000 | MEDICATED_PATCH | CUTANEOUS | 0 refills | Status: DC

## 2023-03-25 NOTE — Patient Instructions (Addendum)
Your shingles should continue to get better with time. I've re-ordered some lidocaine patches for you. You can use more than one if needed. You can also take two pills (600mg ) up to three times per day if this helps you get better control.  Dr. Raymondo Band will see you on Thursday at 3pm  ??? ?? ????? ??????? ???????? ?? ?????? ????? ?????. ??? ??? ?????? ??? ??? ?????? ??????????? ??. ????? ??????? ???? ?? ???? ??? ??? ?????. ????? ????? ????? ????? (600 ????) ??? ???? ???? ?????? ??? ??? ??? ?????? ??? ?????? ???? ????.  ??? ???? ??????? ????? ??? ?????? ?????? 3 ??? ?????  ?? ???? ???????? ????? ?? ????  J Dorothyann Gibbs, MD

## 2023-03-25 NOTE — Progress Notes (Unsigned)
    SUBJECTIVE:   CHIEF COMPLAINT / HPI:   Shingles Had shingles of the back, seen at [facility] and started on Valtrex about a week ago, just finishing course. Also was given lidocaine patches which were quite helpful for pain control and gabapentin 300mg  TID that was less helpful.  He has lots of questions about the diagnosis and treatment plan. He is frustrated that he is still having pain and skin lesions despite coming to the end of his Valtrex course.   Diabetes Meds: Metformin 1000 mg, BID, Farxiga 10 mg daily, Lantus 5 units daily. Is on a statin. Needs a refill of his [place].  He is enrolled in the LIBERATE trial and needs another CGM prior to his next research visit with the pharmacy team.     OBJECTIVE:   BP 133/84   Pulse 82   Wt 135 lb 6.4 oz (61.4 kg)   SpO2 99%   BMI 21.85 kg/m   Gen: Well-appearing, NAD Skin: crusted over papular lesions on an erythematous base in a dermatomal distribution. Quite tender to touch. See photo below.    ASSESSMENT/PLAN:   Herpes zoster without complication Reviewed expected illness course with patient. He has completed course of Valtrex. Pain control is a challenge. Will refill his lidocaine patches. Poor response to gabapentin at 300mg  dose, it is reasonable for him to try increasing his dose to 600mg  TID PRN for pain.  - Follow-up PRN, anticipate improvement over next 1-2 weeks   Diabetes mellitus (HCC) A1c is well controlled at  7.0% today. Continue currrent management.  Metformin 1000 mg, BID, Farxiga 10 mg daily, Lantus 5 units daily.  - Perhaps we are getting to the point where we can d/c his insulin? 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  - CGM sensor obtained from pharmacy team and LIBERATE follow-up appt made     J Dorothyann Gibbs, MD [facility]

## 2023-03-26 DIAGNOSIS — B029 Zoster without complications: Secondary | ICD-10-CM | POA: Insufficient documentation

## 2023-03-26 MED ORDER — GABAPENTIN 300 MG PO CAPS: 600.0000 mg | ORAL_CAPSULE | Freq: Three times a day (TID) | ORAL | Status: AC

## 2023-03-26 NOTE — Assessment & Plan Note (Addendum)
Reviewed expected illness course with patient. He has completed course of Valtrex. Pain control is a challenge. Will refill his lidocaine patches. Poor response to gabapentin at 300mg  dose, it is reasonable for him to try increasing his dose to 600mg  TID PRN for pain.  - Follow-up PRN, anticipate improvement over next 1-2 weeks

## 2023-03-26 NOTE — Assessment & Plan Note (Signed)
A1c is well controlled at  7.0% today. Continue currrent management.  Metformin 1000 mg, BID, Farxiga 10 mg daily, Lantus 5 units daily.  - Perhaps we are getting to the point where we can d/c his insulin? 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  - CGM sensor obtained from pharmacy team and LIBERATE follow-up appt made

## 2023-03-28 ENCOUNTER — Encounter: Payer: Self-pay | Admitting: Pharmacist

## 2023-03-28 ENCOUNTER — Ambulatory Visit: Payer: Medicaid Other | Admitting: Pharmacist

## 2023-03-28 VITALS — Wt 132.8 lb

## 2023-03-28 DIAGNOSIS — Z794 Long term (current) use of insulin: Secondary | ICD-10-CM

## 2023-03-28 DIAGNOSIS — E119 Type 2 diabetes mellitus without complications: Secondary | ICD-10-CM

## 2023-03-28 DIAGNOSIS — Z720 Tobacco use: Secondary | ICD-10-CM

## 2023-03-28 MED ORDER — EMPAGLIFLOZIN 10 MG PO TABS: 10.0000 mg | ORAL_TABLET | Freq: Every day | ORAL | Status: DC

## 2023-03-28 NOTE — Assessment & Plan Note (Signed)
  [name]:  - 6 month visit Diabetes ~3 years currently controlled with GMI of 6.8%. Patient is able to verbalize appropriate hypoglycemia management plan. Medication adherence appears okay, Pt is unable to afford copay for dapagliflozin Aaron Reyes), provided samples of empagliflozin (Jardiance), to cover patient until next PCP visit.  - Started empagliflozin (Jardiance) 10 mg daily with samples (enough for 5 weeks) - Discontinued dapagliflozin (Farxiga) 10 mg daily - Continued insulin glargine (Lantus) 4 units once daily - Continued metformin 1000 mg BID -Extensively discussed pathophysiology of diabetes, recommended lifestyle interventions, dietary effects on blood sugar control.  -Counseled on s/sx of and management of hypoglycemia.

## 2023-03-28 NOTE — Patient Instructions (Addendum)
??? ????? ?? ???? ?????  ???? ?? ?? ???? ????? ????? ?? ????   80-130 ??? ????? ???? ?? 180 ??? ?????.  ??????? ??????: ???? ????????????? (????????) 10 ??? ?????? ???? ?????????????? (????????) 10 ??? ??????  ????? ????? ??????? ???????? ???? ????? ?? ??????? ?????? ?????? ?? ????? ??????? ??? ???????? ?????? ?? ?????????.   ????? ?? ????? ???? ??????? ?????? ???? ???????.   ?? ??????? ???? ????? ?? ???? ?? ?????? ?????? ???? (????? ????? ?? ???? ?? ?????) ??????? ??? ?? ?????? ???????.  ????? ?? ????? ????? ?? ?????? ??????? ??????? ???????. ???? ??? ????? ???? ????? ???? ????????? ???????? ??????? ??????? ?? ?????? (?????? ?????? ?????? ????????). ???? ???? ?? ????? ????? ?? ???? ????? ???????? ??????? ?? ??????? (????? ?? ???????)? ???? ???????? ??????? ??? ?????? ??? ???? ?? ??? ????? ??? ???????.   ??????? ??????? 22/04/2023 ?????? 2:30 ?????   It was nice to see you today!  Your goal blood sugar is 80-130 before eating and less than 180 after eating.  Medication Changes: STOP dapagliflozin (Farxiga) 10 mg daily START empagliflozin (Jardiance) 10 mg daily  Glucose sensors will now be part of your prescriptions and will be sent to your pharmacy to pick up.   Continue all other medication the same.   Monitor blood sugars at home and keep a log (glucometer or piece of paper) to bring with you to your next visit.  Keep up the good work with diet and exercise. Aim for a diet full of vegetables, fruit and lean meats (chicken, [place], fish). Try to limit salt intake by eating fresh or frozen vegetables (instead of canned), rinse canned vegetables prior to cooking and do not add any additional salt to meals.   Goal: Stop smoking cigarettes by the end of the year  Next Visit 05/10/2023 at 2:30 pm

## 2023-03-28 NOTE — Assessment & Plan Note (Signed)
Tobacco Use Disorder - smoking intake of 2-3 e-cigarettes daily, particularly struggles with not smoking when he is upset. Goal set to quit completely by the end of the year.  - Continued varenicline (Chantix) 0.5 mg daily with food

## 2023-03-28 NOTE — Progress Notes (Signed)
S:     Chief Complaint  Patient presents with   Medication Management   47 y.o. male who presents for diabetes evaluation, education, and management in the context of the [name].  Patient speaks Arabic, and this encounter was facilitated with the assistance of a Advertising account executive, Kathlyn [place] [phone].   PMH is significant for DM, HLD, TIA,Tobacco Use, and Shingles  Patient was referred and last seen by Primary Care Provider, Dr. Marisue Humble, on 03/25/2023. At last visit, Pt complained of shingles with severe pain, treated with lidocaine 5% patches, but is unable to afford more patches. Reports smoking 2-3 e-cigarettes/day, especially when upset.   Today, patient arrives in good spirits and presents without any assistance.  Patient reports Diabetes was diagnosed ~3 years ago.   Current diabetes medications include: metformin 1000 mg BID, insulin glargine (Lantus) 4 units once daily, dapagliflozin (Farxiga) 10 mg daily.  Current hypertension medications include: None  Current hyperlipidemia medications include: rosuvastatin 20 mg daily  Patient reports adherence to taking all medications as prescribed.   Do you have any problems obtaining medications due to transportation or finances? Yes. Struggles to afford copays for Medicaid covered medications. Expressed issues paying copay for dapagliflozin (Jardiance) and he is out of medication as of today.   Insurance coverage: [place] Medicaid  CGM Study Study visit: 6 Month Follow Up Visit  CGM Data Download date: 03/28/2023 % Time CGM Is Active: 83 % Average glucose (mg/dL): 914 mg/dL Glucose Management Indicator (%): 6.8 % Glucose Variability (%): 25.6 % Time Above Range >180 mg/dL (%): 17 % Time in Range 70-180 mg/dL (%): 83 % Time Below Range <70 mg/dL (%): 0 %   O:   Review of Systems  Musculoskeletal:  Positive for back pain (shingles pain).    Physical Exam   Lab Results  Component Value Date   HGBA1C 7.0 03/25/2023    Lipid Panel     Component Value Date/Time   CHOL 137 09/21/2020 1659   TRIG 153 (H) 09/21/2020 1659   HDL 36 (L) 09/21/2020 1659   CHOLHDL 3.8 09/21/2020 1659   CHOLHDL 6.1 01/03/2018 0844   VLDL 40 01/03/2018 0844   LDLCALC 74 09/21/2020 1659    Clinical Atherosclerotic Cardiovascular Disease (ASCVD): The 10-year ASCVD risk score (Arnett DK, et al., 2019) is: 8.7%   Values used to calculate the score:     Age: 23 years     Sex: Male     Is Non-Hispanic African American: No     Diabetic: Yes     Tobacco smoker: Yes     Systolic Blood Pressure: 133 mmHg     Is BP treated: No     HDL Cholesterol: 36 mg/dL     Total Cholesterol: 137 mg/dL   Patient is participating in a Managed Medicaid Plan:  Yes   A/P:  [name]:  - 6 month visit Diabetes ~3 years currently controlled with GMI of 6.8%. Patient is able to verbalize appropriate hypoglycemia management plan. Medication adherence appears okay, Pt is unable to afford copay for dapagliflozin Marcelline Deist), provided samples of empagliflozin (Jardiance), to cover patient until next PCP visit.  - Started empagliflozin (Jardiance) 10 mg daily with samples (enough for 5 weeks) - Discontinued dapagliflozin (Farxiga) 10 mg daily - Continued insulin glargine (Lantus) 4 units once daily - Continued metformin 1000 mg BID -Extensively discussed pathophysiology of diabetes, recommended lifestyle interventions, dietary effects on blood sugar control.  -Counseled on s/sx of and management of hypoglycemia.  Tobacco Use Disorder - smoking intake of 2-3 e-cigarettes daily, particularly struggles with not smoking when he is upset. Goal set to quit completely by the end of the year.  - Continued varenicline (Chantix) 0.5 mg daily with food  Facilitated OTC access to 6 lidocaine 4% patches.   ASCVD risk - secondary prevention in patient with diabetes. Last LDL from 2022 was 74 not at goal of <70  mg/dL.  - Continued rosuvastatin (Crestor) 20  mg daily - Continued aspirin 81 mg daily  Total time in face to face counseling 31 minutes.    Follow-up:  Pharmacist PRN. PCP clinic visit on 04/09/2023 with Dr. Royal Piedra.  Patient seen with Caprice Beaver, PharmD Candidate and Shona Simpson, PharmD Candidate. Marland Kitchen

## 2023-04-02 NOTE — Progress Notes (Signed)
Reviewed and agree with Dr Koval's plan.   

## 2023-04-11 ENCOUNTER — Telehealth: Payer: Self-pay

## 2023-04-11 NOTE — Telephone Encounter (Signed)
Pharmacy Patient Advocate Encounter   Received notification from CoverMyMeds that prior authorization for LIDOCAINE 5% PATCHES is required/requested.   PA required; PA submitted to [employer] via CoverMyMeds Key/confirmation #/EOC [id number]. Status is pending

## 2023-04-15 NOTE — Telephone Encounter (Signed)
Pharmacy Patient Advocate Encounter  Received notification from [facility] that Prior Authorization for LIDOCAINE 5%  PATCHES has been APPROVED from 04/11/23 to 04/10/24   PA #/Case ID/Reference #: [id number]

## 2023-04-29 ENCOUNTER — Other Ambulatory Visit: Payer: Self-pay | Admitting: Student

## 2023-05-01 ENCOUNTER — Other Ambulatory Visit: Payer: Self-pay | Admitting: Family Medicine

## 2023-05-01 DIAGNOSIS — E119 Type 2 diabetes mellitus without complications: Secondary | ICD-10-CM

## 2023-05-03 ENCOUNTER — Other Ambulatory Visit: Payer: Self-pay | Admitting: Student

## 2023-05-03 DIAGNOSIS — E119 Type 2 diabetes mellitus without complications: Secondary | ICD-10-CM

## 2023-05-03 MED ORDER — FREESTYLE LIBRE 3 SENSOR MISC
1.0000 | 2 refills | Status: DC
Start: 2023-05-03 — End: 2023-07-30

## 2023-05-10 ENCOUNTER — Ambulatory Visit: Payer: Medicaid Other | Admitting: Student

## 2023-05-10 NOTE — Progress Notes (Deleted)
  SUBJECTIVE:   CHIEF COMPLAINT / HPI:   Type 2 Diabetes: Home medications include: Jardiance 10 mg daily, metformin 1000 mg twice daily, Lantus 4 units daily. {Blank single:19197::"Does","Does not"} endorse compliance. Home glucose monitoring is performed regularly via libre CGM.  Patient is not up to date on diabetic eye.  PERTINENT  PMH / PSH: T2DM, HLD, MDD, PTSD  OBJECTIVE:  There were no vitals taken for this visit. Physical Exam   ASSESSMENT/PLAN:   Assessment & Plan  No follow-ups on file. [name], DO 05/10/2023, 8:06 AM PGY-3, [facility] {    This will disappear when note is signed, click to select method of visit    :1}

## 2023-05-15 ENCOUNTER — Telehealth: Payer: Self-pay

## 2023-05-15 NOTE — Telephone Encounter (Signed)
Pharmacy Patient Advocate Encounter   Received notification from CoverMyMeds that prior authorization for FREESTYLE LIBRE 3 SENSORS is required/requested.    The patient is insured through [facility] .   PA required; PA submitted to above mentioned insurance via CoverMyMeds Key/confirmation #/EOC [id number]. Status is pending

## 2023-05-15 NOTE — Telephone Encounter (Signed)
Pharmacy Patient Advocate Encounter   Received notification from CoverMyMeds that prior authorization for FREESTYLE LIBRE 3 SENSOR is required/requested.   The patient is insured through [facility] .   Per test claim: PA required; PA submitted to above mentioned insurance via CoverMyMeds Key/confirmation #/EOC [id number]. Status is pending

## 2023-05-17 NOTE — Telephone Encounter (Signed)
Pharmacy Patient Advocate Encounter  Received notification from [facility] that Prior Authorization for FREESTYLE LIBRE 3 SENSOR has been APPROVED from 05/15/23 to 11/11/23

## 2023-05-31 ENCOUNTER — Other Ambulatory Visit: Payer: Self-pay | Admitting: Student

## 2023-05-31 DIAGNOSIS — K219 Gastro-esophageal reflux disease without esophagitis: Secondary | ICD-10-CM

## 2023-06-18 ENCOUNTER — Encounter (HOSPITAL_BASED_OUTPATIENT_CLINIC_OR_DEPARTMENT_OTHER): Payer: Self-pay

## 2023-06-18 ENCOUNTER — Emergency Department (HOSPITAL_BASED_OUTPATIENT_CLINIC_OR_DEPARTMENT_OTHER)
Admission: EM | Admit: 2023-06-18 | Discharge: 2023-06-18 | Disposition: A | Discharge status: Home / Self Care | Payer: Medicaid Other | Attending: Emergency Medicine | Admitting: Emergency Medicine

## 2023-06-18 ENCOUNTER — Emergency Department (HOSPITAL_BASED_OUTPATIENT_CLINIC_OR_DEPARTMENT_OTHER): Payer: Medicaid Other | Admitting: Radiology

## 2023-06-18 ENCOUNTER — Other Ambulatory Visit: Payer: Self-pay

## 2023-06-18 DIAGNOSIS — Z7982 Long term (current) use of aspirin: Secondary | ICD-10-CM | POA: Insufficient documentation

## 2023-06-18 DIAGNOSIS — R059 Cough, unspecified: Secondary | ICD-10-CM | POA: Diagnosis present

## 2023-06-18 DIAGNOSIS — E119 Type 2 diabetes mellitus without complications: Secondary | ICD-10-CM | POA: Insufficient documentation

## 2023-06-18 DIAGNOSIS — J111 Influenza due to unidentified influenza virus with other respiratory manifestations: Secondary | ICD-10-CM

## 2023-06-18 DIAGNOSIS — J09X2 Influenza due to identified novel influenza A virus with other respiratory manifestations: Secondary | ICD-10-CM | POA: Insufficient documentation

## 2023-06-18 DIAGNOSIS — Z20822 Contact with and (suspected) exposure to covid-19: Secondary | ICD-10-CM | POA: Insufficient documentation

## 2023-06-18 DIAGNOSIS — Z7984 Long term (current) use of oral hypoglycemic drugs: Secondary | ICD-10-CM | POA: Insufficient documentation

## 2023-06-18 DIAGNOSIS — Z794 Long term (current) use of insulin: Secondary | ICD-10-CM | POA: Diagnosis not present

## 2023-06-18 LAB — RESP PANEL BY RT-PCR (RSV, FLU A&B, COVID)  RVPGX2
Influenza A by PCR: POSITIVE — AB
Influenza B by PCR: NEGATIVE
Resp Syncytial Virus by PCR: NEGATIVE
SARS Coronavirus 2 by RT PCR: NEGATIVE

## 2023-06-18 LAB — GROUP A STREP BY PCR: Group A Strep by PCR: NOT DETECTED — AB

## 2023-06-18 LAB — CBG MONITORING, ED: Glucose-Capillary: 164 mg/dL — ABNORMAL HIGH (ref 70–99)

## 2023-06-18 MED ORDER — NAPROXEN 500 MG PO TABS: 500.0000 mg | ORAL_TABLET | Freq: Two times a day (BID) | ORAL | 0 refills | Status: DC

## 2023-06-18 MED ORDER — KETOROLAC TROMETHAMINE 30 MG/ML IJ SOLN
30.0000 mg | Freq: Once | INTRAMUSCULAR | Status: AC
  Administered 2023-06-18: 30 mg via INTRAMUSCULAR
  Filled 2023-06-18: qty 1

## 2023-06-18 MED ORDER — FLUTICASONE PROPIONATE 50 MCG/ACT NA SUSP: 1.0000 | Freq: Every day | NASAL | 0 refills | Status: AC

## 2023-06-18 MED ORDER — CETIRIZINE HCL 10 MG PO TABS: 10.0000 mg | ORAL_TABLET | Freq: Every day | ORAL | 0 refills | Status: AC

## 2023-06-18 MED ORDER — ACETAMINOPHEN 325 MG PO TABS
650.0000 mg | ORAL_TABLET | Freq: Once | ORAL | Status: AC | PRN
  Administered 2023-06-18: 650 mg via ORAL
  Filled 2023-06-18: qty 2

## 2023-06-18 MED ORDER — ONDANSETRON 4 MG PO TBDP: 4.0000 mg | ORAL_TABLET | Freq: Three times a day (TID) | ORAL | 0 refills | Status: DC | PRN

## 2023-06-18 MED ORDER — OSELTAMIVIR PHOSPHATE 75 MG PO CAPS: 75.0000 mg | ORAL_CAPSULE | Freq: Two times a day (BID) | ORAL | 0 refills | Status: DC

## 2023-06-18 MED ORDER — BENZONATATE 100 MG PO CAPS: 100.0000 mg | ORAL_CAPSULE | Freq: Three times a day (TID) | ORAL | 0 refills | Status: DC

## 2023-06-18 NOTE — ED Notes (Signed)

## 2023-06-18 NOTE — ED Provider Notes (Signed)
 [place] EMERGENCY DEPARTMENT AT [facility] Provider Note   CSN: [id number] Arrival date & time: 06/18/23  1611     History  Chief Complaint  Patient presents with   Cough    Aaron Reyes is a 47 y.o. male history of diabetes here for evaluation of feeling unwell.  3 days of cough, congestion, rhinorrhea, sore throat, headache, nausea.  He has not taken any medications for his symptoms.  Son and wife have similar symptoms.  No neck pain, stiffness.  He is able to tolerate p.o. intake.  No abdominal pain, chest pain, shortness of breath.  He has diffuse myalgias feels overall rundown  HPI     Home Medications Prior to Admission medications   Medication Sig Start Date End Date Taking? Authorizing Provider  benzonatate  (TESSALON ) 100 MG capsule Take 1 capsule (100 mg total) by mouth every 8 (eight) hours. 06/18/23  Yes Alexiya Franqui A, PA-C  cetirizine  (ZYRTEC  ALLERGY) 10 MG tablet Take 1 tablet (10 mg total) by mouth daily. 06/18/23  Yes Yulia Ulrich A, PA-C  fluticasone  (FLONASE ) 50 MCG/ACT nasal spray Place 1 spray into both nostrils daily. 06/18/23  Yes Saharra Santo A, PA-C  naproxen  (NAPROSYN ) 500 MG tablet Take 1 tablet (500 mg total) by mouth 2 (two) times daily. 06/18/23  Yes Kourtland Coopman A, PA-C  ondansetron  (ZOFRAN -ODT) 4 MG disintegrating tablet Take 1 tablet (4 mg total) by mouth every 8 (eight) hours as needed. 06/18/23  Yes Shealyn Sean A, PA-C  oseltamivir  (TAMIFLU ) 75 MG capsule Take 1 capsule (75 mg total) by mouth every 12 (twelve) hours. 06/18/23  Yes Kalila Adkison A, PA-C  aspirin  81 MG chewable tablet CHEW AND SWALLOW 1 TABLET(81 MG) BY MOUTH DAILY 05/02/23   Dahbura, Anton, DO  Blood Glucose Monitoring Suppl KIT 1 kit by Does not apply route in the morning and at bedtime. Patient not taking: Reported on 01/30/2023 09/24/22   Orlando Pond, DO  Continuous Glucose Sensor (FREESTYLE LIBRE 3 SENSOR) MISC 1 Device by Does not  apply route every 14 (fourteen) days. Place 1 sensor on the skin every 14 days. Use to check glucose continuously 05/03/23   Dahbura, Anton, DO  dicyclomine  (BENTYL ) 10 MG capsule Take 1 capsule (10 mg total) by mouth 4 (four) times daily -  before meals and at bedtime. 03/18/23   Raspet, Erin K, PA-C  empagliflozin  (JARDIANCE ) 10 MG TABS tablet Take 1 tablet (10 mg total) by mouth daily. 03/28/23   McDiarmid, Krystal BIRCH, MD  gabapentin  (NEURONTIN ) 300 MG capsule Take 2 capsules (600 mg total) by mouth 3 (three) times daily. 03/26/23   Marlee Lynwood NOVAK, MD  insulin  glargine (LANTUS ) 100 UNIT/ML Solostar Pen Inject 5 Units into the skin in the morning. Patient taking differently: Inject 4 Units into the skin in the morning. 11/13/22   McDiarmid, Krystal BIRCH, MD  Insulin  Pen Needle (BD PEN NEEDLE NANO 2ND GEN) 32G X 4 MM MISC Use to administer insulin  once daily. 10/04/22   McDiarmid, Krystal BIRCH, MD  lidocaine  (LIDODERM ) 5 % Place 1-2 patches onto the skin daily. Remove & Discard patch within 12 hours or as directed by MD 03/25/23   Marlee Lynwood NOVAK, MD  metFORMIN  (GLUCOPHAGE ) 1000 MG tablet Take 1 tablet (1,000 mg total) by mouth 2 (two) times daily with a meal. 11/13/22   McDiarmid, Krystal BIRCH, MD  omeprazole  (PRILOSEC) 20 MG capsule Take 1 capsule (20 mg total) by mouth daily. 03/18/23   Raspet, Erin K,  PA-C  ondansetron  (ZOFRAN ) 4 MG tablet Take 1 tablet (4 mg total) by mouth every 8 (eight) hours as needed for nausea or vomiting. 01/30/23   McDiarmid, Krystal BIRCH, MD  predniSONE (DELTASONE) 20 MG tablet Take 20 mg by mouth 2 (two) times daily. 03/05/23   [provider]  QUEtiapine  (SEROQUEL ) 25 MG tablet TAKE 1 TABLET(25 MG) BY MOUTH AT BEDTIME 02/21/22   Joshua Domino, DO  rosuvastatin  (CRESTOR ) 20 MG tablet Take 1 tablet (20 mg total) by mouth daily. 09/27/22 09/22/23  McDiarmid, Krystal BIRCH, MD  traZODone  (DESYREL ) 50 MG tablet Take 1 tablet (50 mg total) by mouth at bedtime. 01/30/23   McDiarmid, Krystal BIRCH, MD  varenicline   (CHANTIX ) 0.5 MG tablet Take 1 tablet (0.5 mg total) by mouth daily with supper. 01/30/23   McDiarmid, Krystal BIRCH, MD      Allergies    Pork-derived products    Review of Systems   Review of Systems  Constitutional:  Positive for activity change, appetite change and chills.  HENT:  Positive for congestion, rhinorrhea and sore throat.   Respiratory:  Positive for cough. Negative for apnea, choking, chest tightness, shortness of breath, wheezing and stridor.   Cardiovascular: Negative.   Gastrointestinal:  Positive for nausea. Negative for abdominal distention, abdominal pain, anal bleeding, blood in stool, constipation, diarrhea, rectal pain and vomiting.  Genitourinary: Negative.   Musculoskeletal:  Positive for myalgias.  Skin: Negative.   Neurological:  Positive for headaches. Negative for dizziness, tremors, seizures, syncope, facial asymmetry, speech difficulty, weakness, light-headedness and numbness.  All other systems reviewed and are negative.   Physical Exam Updated Vital Signs BP 135/82 (BP Location: Right Arm)   Pulse (!) 106   Temp 98 F (36.7 C) (Oral)   Resp 20   Wt 65 kg   SpO2 98%   BMI 23.13 kg/m  Physical Exam Vitals and nursing note reviewed.  Constitutional:      General: He is not in acute distress.    Appearance: He is not ill-appearing, toxic-appearing or diaphoretic.  HENT:     Head: Normocephalic and atraumatic.     Jaw: There is normal jaw occlusion.     Right Ear: Tympanic membrane, ear canal and external ear normal. There is no impacted cerumen. No hemotympanum. Tympanic membrane is not injected, scarred, perforated, erythematous, retracted or bulging.     Left Ear: Tympanic membrane, ear canal and external ear normal. There is no impacted cerumen. No hemotympanum. Tympanic membrane is not injected, scarred, perforated, erythematous, retracted or bulging.     Ears:     Comments: No Mastoid tenderness.    Nose:     Comments: Clear rhinorrhea and  congestion to bilateral nares.  No sinus tenderness.    Mouth/Throat:     Comments: Posterior oropharynx clear.  Mucous membranes moist.  Tonsils without erythema or exudate.  Uvula midline without deviation.  No evidence of PTA or RPA.  No drooling, dysphasia or trismus.  Phonation normal. Neck:     Trachea: Trachea and phonation normal.     Meningeal: Brudzinski's sign and Kernig's sign absent.     Comments: No Neck stiffness or neck rigidity.  No meningismus.  No cervical lymphadenopathy. Cardiovascular:     Comments: No murmurs rubs or gallops. Pulmonary:     Comments: Clear to auscultation bilaterally without wheeze, rhonchi or rales.  No accessory muscle usage.  Able speak in full sentences. Abdominal:     Comments: Soft, nontender without rebound or  guarding.  No CVA tenderness.  Musculoskeletal:     Comments: Moves all 4 extremities without difficulty.  Lower extremities without edema, erythema or warmth.  Skin:    Comments: Brisk capillary refill.  No rashes or lesions.  Neurological:     Mental Status: He is alert.     Comments: Ambulatory in department without difficulty.  Cranial nerves II through XII grossly intact.  No facial droop.  No aphasia.     ED Results / Procedures / Treatments   Labs (all labs ordered are listed, but only abnormal results are displayed) Labs Reviewed  GROUP A STREP BY PCR - Abnormal; Notable for the following components:      Result Value   Group A Strep by PCR NONE DETECTED (*)    All other components within normal limits  RESP PANEL BY RT-PCR (RSV, FLU A&B, COVID)  RVPGX2 - Abnormal; Notable for the following components:   Influenza A by PCR POSITIVE (*)    All other components within normal limits  CBG MONITORING, ED - Abnormal; Notable for the following components:   Glucose-Capillary 164 (*)    All other components within normal limits  RESP PANEL BY RT-PCR (RSV, FLU A&B, COVID)  RVPGX2    EKG None  Radiology DG Chest 2  View Result Date: 06/18/2023 CLINICAL DATA:  Productive cough.  Chills. EXAM: CHEST - 2 VIEW COMPARISON:  Radiograph and CT 06/18/2022 FINDINGS: Normal heart size. Stable mediastinal contours. Chronic bronchial thickening. Subsegmental atelectasis in the dependent lower lobes. No confluent consolidation. No pulmonary edema. No pleural fluid or pneumothorax. On limited assessment, no acute osseous findings. IMPRESSION: Chronic bronchial thickening. Subsegmental atelectasis in the dependent lower lobes. Electronically Signed   By: Andrea Gasman M.D.   On: 06/18/2023 20:16    Procedures Procedures    Medications Ordered in ED Medications  acetaminophen  (TYLENOL ) tablet 650 mg (650 mg Oral Given 06/18/23 1727)  ketorolac  (TORADOL ) 30 MG/ML injection 30 mg (30 mg Intramuscular Given 06/18/23 2316)    ED Course/ Medical Decision Making/ A&P    47 year old here for evaluation of URI symptoms.  3 days of headache, cough, congestion, rhinorrhea, sore throat, nausea.  No meds PTA.  He is afebrile, nonseptic, not ill-appearing.  Heart and lungs clear.  Abdomen soft, nontender.  No neck stiffness or neck rigidity.  No meningismus.  Labs and imaging personally viewed and interpreted:  CBG 164 Strep not detected Influenza A positive Chest x-ray chronic bronchial thickening  Discussed results with patient.  He is requesting IM Toradol  as he states he has had this with his prior illnesses and this helped with his myalgias.  Tolerating p.o. intake.  He appears clinically well-hydrated.  I discussed Tamiflu .  He would like to try this.  We discussed risk versus benefit.  He voiced understanding with Arabic interpreter.  Will write for symptomatic management outpatient.  The patient has been appropriately medically screened and/or stabilized in the ED. I have low suspicion for any other emergent medical condition which would require further screening, evaluation or treatment in the ED or require  inpatient management.  Patient is hemodynamically stable and in no acute distress.  Patient able to ambulate in department prior to ED.  Evaluation does not show acute pathology that would require ongoing or additional emergent interventions while in the emergency department or further inpatient treatment.  I have discussed the diagnosis with the patient and answered all questions.  Pain is been managed while in the emergency department  and patient has no further complaints prior to discharge.  Patient is comfortable with plan discussed in room and is stable for discharge at this time.  I have discussed strict return precautions for returning to the emergency department.  Patient was encouraged to follow-up with PCP/specialist refer to at discharge.                                Medical Decision Making Amount and/or Complexity of Data Reviewed Radiology: ordered.  Risk OTC drugs. Prescription drug management.           Final Clinical Impression(s) / ED Diagnoses Final diagnoses:  Influenza    Rx / DC Orders ED Discharge Orders          Ordered    oseltamivir  (TAMIFLU ) 75 MG capsule  Every 12 hours        06/18/23 2308    ondansetron  (ZOFRAN -ODT) 4 MG disintegrating tablet  Every 8 hours PRN        06/18/23 2308    benzonatate  (TESSALON ) 100 MG capsule  Every 8 hours        06/18/23 2308    fluticasone  (FLONASE ) 50 MCG/ACT nasal spray  Daily        06/18/23 2308    cetirizine  (ZYRTEC  ALLERGY) 10 MG tablet  Daily        06/18/23 2308    naproxen  (NAPROSYN ) 500 MG tablet  2 times daily        06/18/23 2309              Kacen Mellinger A, PA-C 06/18/23 2319    Zackowski, Scott, MD 06/21/23 2327

## 2023-06-18 NOTE — Discharge Instructions (Signed)
????? ??????? ??? ????? ???????  ?????? ??????? ??????? ?? ?????????   tanawal al'adwiat ealaa alnahw almawsuf aleawdat lil'aerad aljadidat 'aw almutafaqima

## 2023-06-18 NOTE — ED Triage Notes (Signed)
 In for eval of productive cough with yellow sputum, chills, sore throat, and headache.

## 2023-07-30 ENCOUNTER — Encounter: Payer: Self-pay | Admitting: Student

## 2023-07-30 ENCOUNTER — Ambulatory Visit: Payer: Medicaid Other | Admitting: Student

## 2023-07-30 VITALS — BP 110/87 | HR 90 | Wt 135.4 lb

## 2023-07-30 DIAGNOSIS — E785 Hyperlipidemia, unspecified: Secondary | ICD-10-CM

## 2023-07-30 DIAGNOSIS — E119 Type 2 diabetes mellitus without complications: Secondary | ICD-10-CM | POA: Diagnosis present

## 2023-07-30 DIAGNOSIS — Z23 Encounter for immunization: Secondary | ICD-10-CM

## 2023-07-30 LAB — POCT GLYCOSYLATED HEMOGLOBIN (HGB A1C): HbA1c, POC (controlled diabetic range): 9.8 % — AB (ref 0.0–7.0)

## 2023-07-30 MED ORDER — METFORMIN HCL 1000 MG PO TABS: 1000.0000 mg | ORAL_TABLET | Freq: Two times a day (BID) | ORAL | 1 refills | Status: DC

## 2023-07-30 MED ORDER — FREESTYLE LIBRE 3 SENSOR MISC: 1.0000 | 1 refills | Status: DC

## 2023-07-30 MED ORDER — INSULIN GLARGINE 100 UNIT/ML SOLOSTAR PEN: 5.0000 [IU] | PEN_INJECTOR | Freq: Every morning | SUBCUTANEOUS | 1 refills | Status: DC

## 2023-07-30 MED ORDER — EMPAGLIFLOZIN 10 MG PO TABS: 10.0000 mg | ORAL_TABLET | Freq: Every day | ORAL | 1 refills | Status: DC

## 2023-07-30 MED ORDER — ROSUVASTATIN CALCIUM 20 MG PO TABS
20.0000 mg | ORAL_TABLET | Freq: Every day | ORAL | 1 refills | Status: DC
Start: 2023-07-30 — End: 2023-08-02

## 2023-07-30 MED ORDER — ASPIRIN 81 MG PO CHEW: 81.0000 mg | CHEWABLE_TABLET | Freq: Every day | ORAL | 1 refills | Status: DC

## 2023-07-30 NOTE — Patient Instructions (Addendum)
It was great to see you today! Thank you for choosing [facility] for your primary care.  ??????? ?????: 1. ??? ????? ???? ???????? ??? ?????? ?????? ?? ???????? ??? ????? ?????????? ?????????.  ??? ??? ?????? ???? 3 ?????? ??? ????? ?? ?????? ??????? ??????? ????? ????????? ????? ???? ???????.  ???? ????? ?? ?????? ??? ????? ??????? ?? ??????? ???? ??? ???? ???? ???? ?? ????? ???? ?????? ??? 11 ????? ??? ??? ?? ???? ?????? ??? ????????? ?? ???????? ?? ????????.  ???? ?????? ?? ??? ????? ??????? ?????? ?? ??? ?????? ??? ????????. tanawalna alyawma: 1. laqad 'ursilat jamie mustalzamat marad alsukari alkhasat bik bial'iidafat 'iilaa 'adwiat alkulistrul wal'asbrin. 'awadu mink aleawdat khilal 3 'asabie hataa natamakan min murajaeat alnatayij alkamilat lijihaz alaistishear al'awal aladhi airtadaytahu. lisu' alhaz, la yumkinuni siwaa 'iirsal 'iimdadat min al'adwiat limudat shahr wahid faqat, walakina kula wahidat minha satahtawi ealaa 11 eabuat, lidha yajib 'an takun qadran ealaa alaistimrar fi aistilamiha min alsaydaliati. yurjaa alta'akud min 'akhdh bitaqat altaamin alkhasat bik maeak dayman 'iilaa alsaydaliati.  Today we addressed: I sent in all of your diabetes supplies in addition to your cholesterol and aspirin medication.  I would like you to return in 3 weeks and we can go over your full results of the first sensor you have worn.  Unfortunately I can only send in 1 month supplies of medication however each of these will have 11 refills so you should be able to continue to pick them up from the pharmacy.  Please make sure to always take your insurance card to the pharmacy with you.  If you haven't already, sign up for My Chart to have easy access to your labs results, and communication with your primary care physician.  Return in about 3 weeks (around 08/20/2023) for Diabetes follow-up. Please arrive 15 minutes before your appointment to ensure smooth check in process.  We appreciate your  efforts in making this happen.  Thank you for allowing me to participate in your care, [name], DO 07/30/2023, 2:56 PM PGY-3, [facility]

## 2023-07-30 NOTE — Progress Notes (Unsigned)
  SUBJECTIVE:   CHIEF COMPLAINT / HPI:   Type 2 Diabetes: Home medications include: Jardiance 10 mg daily, Lantus 4 units daily, metformin 1000 mg twice daily. Does not endorse compliance. Home glucose monitoring is not performed regularly because he is out of the [place] patch. Patient is not up to date on diabetic eye. He is out of these medications and only sometimes takes the insulin. He still has some of the metformin.   PERTINENT  PMH / PSH: T2DM, HLD, MDD, PTSD   OBJECTIVE:  BP 110/87   Pulse 90   Wt 135 lb 6.4 oz (61.4 kg)   SpO2 99%   BMI 21.85 kg/m  Physical Exam   ASSESSMENT/PLAN:   Assessment & Plan Type 2 diabetes mellitus without complication, without long-term current use of insulin (HCC)  Hyperlipidemia, unspecified hyperlipidemia type  No follow-ups on file. [name], DO 07/30/2023, 2:37 PM PGY-3, [facility] {    This will disappear when note is signed, click to select method of visit    :1}

## 2023-07-31 LAB — MICROALBUMIN / CREATININE URINE RATIO
Creatinine, Urine: 114.4 mg/dL
Microalb/Creat Ratio: 35 mg/g{creat} — ABNORMAL HIGH (ref 0–29)
Microalbumin, Urine: 40.4 ug/mL

## 2023-07-31 NOTE — Assessment & Plan Note (Signed)
A1c 9.8, suspect related to medication nonadherence.  He continues to have difficulty navigating the system and pharmacy has been unhelpful for him.  I have educated him that he needs to take his insurance card to the pharmacy every time to pick up refills.  Dr. Raymondo Band provided 1 month supply of libre 3 CGM.  Refilled metformin 1000 mg twice daily, Jardiance 10 mg daily, Lantus 5 units every morning with year-long refills of each.  Return in 3 weeks to discuss most recent CGM report. Check microalbumin/creatinine ratio.

## 2023-07-31 NOTE — Assessment & Plan Note (Signed)
Refilled aspirin and Crestor.

## 2023-08-01 ENCOUNTER — Encounter: Payer: Self-pay | Admitting: Student

## 2023-08-01 DIAGNOSIS — E1129 Type 2 diabetes mellitus with other diabetic kidney complication: Secondary | ICD-10-CM | POA: Insufficient documentation

## 2023-08-02 ENCOUNTER — Other Ambulatory Visit: Payer: Self-pay | Admitting: Student

## 2023-08-02 DIAGNOSIS — E785 Hyperlipidemia, unspecified: Secondary | ICD-10-CM

## 2023-08-02 MED ORDER — ROSUVASTATIN CALCIUM 20 MG PO TABS: 20.0000 mg | ORAL_TABLET | Freq: Every day | ORAL | 1 refills | Status: DC

## 2023-08-02 MED ORDER — OMEPRAZOLE 20 MG PO CPDR: 20.0000 mg | DELAYED_RELEASE_CAPSULE | Freq: Every day | ORAL | 0 refills | Status: DC

## 2023-09-05 ENCOUNTER — Other Ambulatory Visit: Payer: Self-pay

## 2023-09-05 DIAGNOSIS — E119 Type 2 diabetes mellitus without complications: Secondary | ICD-10-CM

## 2023-09-06 MED ORDER — FREESTYLE LIBRE 3 SENSOR MISC: 1.0000 | 1 refills | Status: DC

## 2023-10-14 ENCOUNTER — Other Ambulatory Visit (HOSPITAL_COMMUNITY): Payer: Self-pay

## 2023-10-14 ENCOUNTER — Telehealth: Payer: Self-pay

## 2023-10-14 NOTE — Telephone Encounter (Signed)
 Pharmacy Patient Advocate Encounter   Received notification from CoverMyMeds that prior authorization for FreeStyle Libre 3 Sensor is required/requested.   Insurance verification completed.   The patient is insured through [facility] .   PA required; PA submitted to above mentioned insurance via CoverMyMeds Key/confirmation #/EOC [employer]. Status is pending

## 2023-10-15 NOTE — Telephone Encounter (Signed)
 Pharmacy Patient Advocate Encounter  Received notification from [facility] that Prior Authorization for FREESTYLE LIBRE 3 SENSOR has been APPROVED from 10/14/23 to 10/13/24   PA #/Case ID/Reference #: [id number]

## 2023-11-15 ENCOUNTER — Other Ambulatory Visit: Payer: Self-pay

## 2023-11-15 ENCOUNTER — Other Ambulatory Visit: Payer: Self-pay | Admitting: Family Medicine

## 2023-11-15 ENCOUNTER — Other Ambulatory Visit: Payer: Self-pay | Admitting: Student

## 2023-11-15 DIAGNOSIS — E119 Type 2 diabetes mellitus without complications: Secondary | ICD-10-CM

## 2023-11-15 MED ORDER — OMEPRAZOLE 20 MG PO CPDR: 20.0000 mg | DELAYED_RELEASE_CAPSULE | Freq: Every day | ORAL | 0 refills | Status: DC

## 2023-11-15 MED ORDER — ASPIRIN 81 MG PO CHEW: 81.0000 mg | CHEWABLE_TABLET | Freq: Every day | ORAL | 1 refills | Status: AC

## 2023-11-15 MED ORDER — FREESTYLE LIBRE 3 SENSOR MISC: 1.0000 | 1 refills | Status: DC

## 2023-11-26 ENCOUNTER — Encounter: Payer: Self-pay | Admitting: *Deleted

## 2023-12-02 ENCOUNTER — Ambulatory Visit

## 2023-12-03 ENCOUNTER — Ambulatory Visit (INDEPENDENT_AMBULATORY_CARE_PROVIDER_SITE_OTHER): Admitting: Family Medicine

## 2023-12-03 ENCOUNTER — Other Ambulatory Visit: Payer: Self-pay | Admitting: Family Medicine

## 2023-12-03 ENCOUNTER — Encounter: Payer: Self-pay | Admitting: Family Medicine

## 2023-12-03 VITALS — BP 123/88 | HR 89 | Wt 133.8 lb

## 2023-12-03 DIAGNOSIS — S93601D Unspecified sprain of right foot, subsequent encounter: Secondary | ICD-10-CM | POA: Diagnosis present

## 2023-12-03 DIAGNOSIS — R809 Proteinuria, unspecified: Secondary | ICD-10-CM | POA: Diagnosis not present

## 2023-12-03 DIAGNOSIS — E119 Type 2 diabetes mellitus without complications: Secondary | ICD-10-CM

## 2023-12-03 DIAGNOSIS — K219 Gastro-esophageal reflux disease without esophagitis: Secondary | ICD-10-CM

## 2023-12-03 DIAGNOSIS — E785 Hyperlipidemia, unspecified: Secondary | ICD-10-CM | POA: Diagnosis not present

## 2023-12-03 DIAGNOSIS — E1129 Type 2 diabetes mellitus with other diabetic kidney complication: Secondary | ICD-10-CM

## 2023-12-03 LAB — POCT GLYCOSYLATED HEMOGLOBIN (HGB A1C): HbA1c, POC (controlled diabetic range): 7.5 % — AB (ref 0.0–7.0)

## 2023-12-03 MED ORDER — ROSUVASTATIN CALCIUM 20 MG PO TABS
20.0000 mg | ORAL_TABLET | Freq: Every day | ORAL | 1 refills | Status: AC
Start: 2023-12-03 — End: 2024-11-27

## 2023-12-03 MED ORDER — FREESTYLE LIBRE 3 SENSOR MISC: 1.0000 | 1 refills | Status: DC

## 2023-12-03 MED ORDER — INSULIN GLARGINE 100 UNIT/ML SOLOSTAR PEN: 5.0000 [IU] | PEN_INJECTOR | Freq: Every morning | SUBCUTANEOUS | 1 refills | Status: AC

## 2023-12-03 MED ORDER — METFORMIN HCL 1000 MG PO TABS: 1000.0000 mg | ORAL_TABLET | Freq: Two times a day (BID) | ORAL | 0 refills | Status: DC

## 2023-12-03 MED ORDER — EMPAGLIFLOZIN 10 MG PO TABS: 10.0000 mg | ORAL_TABLET | Freq: Every day | ORAL | 1 refills | Status: DC

## 2023-12-03 MED ORDER — PANTOPRAZOLE SODIUM 20 MG PO TBEC: 20.0000 mg | DELAYED_RELEASE_TABLET | Freq: Every day | ORAL | 3 refills | Status: AC

## 2023-12-03 NOTE — Patient Instructions (Addendum)
????? ?????? ????? - ????? ???????.  ?????? ???? ???????? ?????:  ?) ????? ?????? ??? ????? ??? ????.  ?) ????? ?? ????? ??? ????? ?????? ?????? ??.  - ????? ????????? ???? ??? ????? ??????. - ????? ???????? ??? ????? ??????. - ????? ? ????? ?? ????????? ??? ????? ??????.       Good to see you today - Thank you for coming in  Things we discussed today:  1) You can return to work without restrictions.  2) I am sending a refill of your diabetes medications. - Take metformin  1000mg  twice a day - Take jardiance  once a day

## 2023-12-03 NOTE — Progress Notes (Signed)
    SUBJECTIVE:   CHIEF COMPLAINT / HPI:   Aaron Reyes is a 48yo M w/ hx of T2DM that pf R foot pain f/u  - Seen 5/3 at [facility] for R foot sprain - He works at [employer] and was carrying a heavy box and dropped it on his foot.  - Pain is improving - Needs paperwork to say he is ok to go back to work.  - Was out of work for about 1.5 months.   MRI foot from 5/3 showed IMPRESSION: Low-grade sprain of the Lisfranc ligament. No acute bony abnormality or malalignment.   T2DM - needs refill of his CGM libre and diabetes meds.  - Reports that he tried to get his diabetes meds but the pharmacy said he didn't have any refills left.   OBJECTIVE:   BP 123/88   Pulse 89   Wt 133 lb 12.8 oz (60.7 kg)   SpO2 99%   BMI 21.60 kg/m   General: Alert, pleasant man. NAD. HEENT: NCAT. MMM. CV: RRR, no murmurs.   Resp: CTAB, no wheezing or crackles. Normal WOB on RA.  Abm: Soft, nontender, nondistended. BS present. Ext:  mildly tender to palpation of dorsum of R foot. Regular gait, no pain with walking.  Skin: Warm, well perfused    ASSESSMENT/PLAN:   Assessment & Plan Sprain of right foot, subsequent encounter Healing well and able to return to work without restriction immediately.  Work letter provided Type 2 diabetes mellitus without complication, without long-term current use of insulin  (HCC) A1c 7.5, improving but still above goal.  Suspect current control may be suboptimal due to running out of medications. - Refill provided for CGM, Jardiance , metformin , and Lantus  - f/u w/ PCP    Twyla Nearing, MD [facility]

## 2023-12-05 NOTE — Assessment & Plan Note (Signed)
 A1c 7.5, improving but still above goal.  Suspect current control may be suboptimal due to running out of medications. - Refill provided for CGM, Jardiance , metformin , and Lantus  - f/u w/ PCP

## 2023-12-10 ENCOUNTER — Ambulatory Visit: Payer: Self-pay | Admitting: Student

## 2023-12-10 NOTE — Progress Notes (Deleted)
  SUBJECTIVE:   CHIEF COMPLAINT / HPI:   Type 2 Diabetes: Home medications include: Metformin  1000 mg twice daily, Jardiance  10 mg daily, Lantus  5 units every morning. {Blank single:19197::Does,Does not} endorse compliance. Home glucose monitoring is performed regularly with CGM. Patient is not up to date on diabetic eye. Most recent A1Cs: 7.5 1 week ago.   Arabic interpreter used throughout encounter.  PERTINENT  PMH / PSH: T2DM, HLD, MDD, PTSD   OBJECTIVE:  There were no vitals taken for this visit. ***  ASSESSMENT/PLAN:   Assessment & Plan  No follow-ups on file. Kieth Johnson, DO 12/10/2023, 8:11 AM PGY-3, [facility] {    This will disappear when note is signed, click to select method of visit    :1}

## 2023-12-12 ENCOUNTER — Ambulatory Visit: Admitting: Student

## 2023-12-21 ENCOUNTER — Other Ambulatory Visit: Payer: Self-pay | Admitting: Family Medicine

## 2023-12-21 DIAGNOSIS — E119 Type 2 diabetes mellitus without complications: Secondary | ICD-10-CM

## 2024-01-28 ENCOUNTER — Other Ambulatory Visit: Payer: Self-pay | Admitting: Pharmacist

## 2024-01-28 MED ORDER — FREESTYLE LIBRE 3 PLUS SENSOR MISC: 11 refills | Status: AC

## 2024-01-28 NOTE — Progress Notes (Signed)
 New Rx of Libre 3 + sensor provided to pharmacy. Unable to contact due to language barrier.

## 2024-06-24 ENCOUNTER — Ambulatory Visit: Payer: Self-pay | Admitting: Family Medicine

## 2024-06-24 ENCOUNTER — Ambulatory Visit: Payer: Self-pay

## 2024-07-14 ENCOUNTER — Ambulatory Visit: Admitting: Family Medicine

## 2024-07-14 NOTE — Progress Notes (Unsigned)
" ° ° °  SUBJECTIVE:   CHIEF COMPLAINT / HPI:   T2DM -Current medication regimen: Lantus  5u*** daily, metformin  1000mg  BID, Jardiance  10mg  daily. On crestor  and gabapentin  as well. -Home CBGs:*** -Denies polyuria, polydipsia, abdominal pain, chest pain, shortness of breath*** -Foot exam: ***today -Eye exam: ***due, discussed  Lab Results  Component Value Date   HGBA1C 7.5 (A) 12/03/2023   HGBA1C 9.8 (A) 07/30/2023   HGBA1C 7.0 03/25/2023        Discussed the use of AI scribe software for clinical note transcription with the patient, who gave verbal consent to proceed.  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   Physical Exam   ***Feet: No notable skin breakdown or wounds.  Sensation intact to sharp and dull sensation in multiple locations on the toes, dorsum of foot, sole, and above the ankle.  Proprioception intact.  Able to bear weight.***  ***  ASSESSMENT/PLAN:   Assessment and Plan Assessment & Plan     ***  Assessment & Plan Type 2 diabetes mellitus without complication, without long-term current use of insulin  (HCC)  Hyperlipidemia, unspecified hyperlipidemia type  Screen for colon cancer    Payton Coward, MD [facility] "

## 2024-07-25 ENCOUNTER — Other Ambulatory Visit: Payer: Self-pay | Admitting: Family Medicine

## 2024-07-25 DIAGNOSIS — E119 Type 2 diabetes mellitus without complications: Secondary | ICD-10-CM
